# Patient Record
Sex: Female | Born: 1955
Health system: Southern US, Community
[De-identification: ages and names within clinical notes are randomized; demographics above are authoritative.]

## PROBLEM LIST (undated history)

## (undated) DIAGNOSIS — R7309 Other abnormal glucose: Secondary | ICD-10-CM

## (undated) DIAGNOSIS — I10 Essential (primary) hypertension: Secondary | ICD-10-CM

## (undated) DIAGNOSIS — E559 Vitamin D deficiency, unspecified: Secondary | ICD-10-CM

## (undated) DIAGNOSIS — F419 Anxiety disorder, unspecified: Secondary | ICD-10-CM

## (undated) DIAGNOSIS — T7840XA Allergy, unspecified, initial encounter: Secondary | ICD-10-CM

## (undated) DIAGNOSIS — F329 Major depressive disorder, single episode, unspecified: Secondary | ICD-10-CM

## (undated) DIAGNOSIS — G4730 Sleep apnea, unspecified: Secondary | ICD-10-CM

## (undated) DIAGNOSIS — M1990 Unspecified osteoarthritis, unspecified site: Secondary | ICD-10-CM

## (undated) DIAGNOSIS — M797 Fibromyalgia: Secondary | ICD-10-CM

## (undated) HISTORY — DX: Other abnormal glucose: R73.09

## (undated) HISTORY — DX: Anxiety disorder, unspecified: F41.9

## (undated) HISTORY — DX: Major depressive disorder, single episode, unspecified: F32.9

## (undated) HISTORY — PX: KNEE ARTHROPLASTY: SHX992

## (undated) HISTORY — DX: Essential (primary) hypertension: I10

## (undated) HISTORY — PX: JOINT REPLACEMENT: SHX530

## (undated) HISTORY — DX: Fibromyalgia: M79.7

## (undated) HISTORY — DX: Vitamin D deficiency, unspecified: E55.9

## (undated) HISTORY — DX: Allergy, unspecified, initial encounter: T78.40XA

---

## 1998-12-23 ENCOUNTER — Encounter: Payer: Self-pay | Admitting: Internal Medicine

## 1998-12-23 ENCOUNTER — Ambulatory Visit (HOSPITAL_COMMUNITY): Admission: RE | Admit: 1998-12-23 | Discharge: 1998-12-23 | Payer: Self-pay | Admitting: Internal Medicine

## 1999-06-11 ENCOUNTER — Other Ambulatory Visit: Admission: RE | Admit: 1999-06-11 | Discharge: 1999-06-11 | Payer: Self-pay | Admitting: Internal Medicine

## 1999-07-07 ENCOUNTER — Encounter: Payer: Self-pay | Admitting: Internal Medicine

## 1999-07-07 ENCOUNTER — Ambulatory Visit (HOSPITAL_COMMUNITY): Admission: RE | Admit: 1999-07-07 | Discharge: 1999-07-07 | Payer: Self-pay | Admitting: Internal Medicine

## 2000-01-18 ENCOUNTER — Emergency Department (HOSPITAL_COMMUNITY): Admission: EM | Admit: 2000-01-18 | Discharge: 2000-01-18 | Payer: Self-pay | Admitting: Emergency Medicine

## 2000-06-22 HISTORY — PX: ABDOMINAL HYSTERECTOMY: SHX81

## 2000-07-19 ENCOUNTER — Ambulatory Visit (HOSPITAL_COMMUNITY): Admission: RE | Admit: 2000-07-19 | Discharge: 2000-07-19 | Payer: Self-pay | Admitting: Internal Medicine

## 2000-07-19 ENCOUNTER — Encounter: Payer: Self-pay | Admitting: Internal Medicine

## 2001-02-23 ENCOUNTER — Other Ambulatory Visit: Admission: RE | Admit: 2001-02-23 | Discharge: 2001-02-23 | Payer: Self-pay | Admitting: Obstetrics & Gynecology

## 2001-05-10 ENCOUNTER — Observation Stay (HOSPITAL_COMMUNITY): Admission: RE | Admit: 2001-05-10 | Discharge: 2001-05-11 | Payer: Self-pay | Admitting: Obstetrics & Gynecology

## 2001-05-10 ENCOUNTER — Encounter (INDEPENDENT_AMBULATORY_CARE_PROVIDER_SITE_OTHER): Payer: Self-pay | Admitting: Specialist

## 2001-07-25 ENCOUNTER — Ambulatory Visit (HOSPITAL_COMMUNITY): Admission: RE | Admit: 2001-07-25 | Discharge: 2001-07-25 | Payer: Self-pay | Admitting: Internal Medicine

## 2001-07-25 ENCOUNTER — Encounter: Payer: Self-pay | Admitting: Internal Medicine

## 2001-07-28 ENCOUNTER — Ambulatory Visit (HOSPITAL_COMMUNITY): Admission: RE | Admit: 2001-07-28 | Discharge: 2001-07-28 | Payer: Self-pay | Admitting: Internal Medicine

## 2001-07-28 ENCOUNTER — Encounter: Payer: Self-pay | Admitting: Internal Medicine

## 2002-08-07 ENCOUNTER — Ambulatory Visit (HOSPITAL_COMMUNITY): Admission: RE | Admit: 2002-08-07 | Discharge: 2002-08-07 | Payer: Self-pay | Admitting: Internal Medicine

## 2002-08-07 ENCOUNTER — Encounter: Payer: Self-pay | Admitting: Internal Medicine

## 2002-10-08 ENCOUNTER — Encounter: Payer: Self-pay | Admitting: Pediatrics

## 2002-10-08 ENCOUNTER — Encounter: Payer: Self-pay | Admitting: Emergency Medicine

## 2002-10-08 ENCOUNTER — Inpatient Hospital Stay (HOSPITAL_COMMUNITY): Admission: EM | Admit: 2002-10-08 | Discharge: 2002-10-11 | Payer: Self-pay | Admitting: Emergency Medicine

## 2002-10-09 ENCOUNTER — Encounter: Payer: Self-pay | Admitting: Pediatrics

## 2002-10-11 ENCOUNTER — Inpatient Hospital Stay (HOSPITAL_COMMUNITY): Admission: EM | Admit: 2002-10-11 | Discharge: 2002-10-14 | Payer: Self-pay | Admitting: Psychiatry

## 2003-06-29 ENCOUNTER — Encounter: Admission: RE | Admit: 2003-06-29 | Discharge: 2003-06-29 | Payer: Self-pay | Admitting: Internal Medicine

## 2003-07-13 ENCOUNTER — Encounter: Admission: RE | Admit: 2003-07-13 | Discharge: 2003-07-13 | Payer: Self-pay | Admitting: Internal Medicine

## 2003-09-10 ENCOUNTER — Encounter: Admission: RE | Admit: 2003-09-10 | Discharge: 2003-09-10 | Payer: Self-pay | Admitting: Internal Medicine

## 2004-08-20 ENCOUNTER — Encounter: Admission: RE | Admit: 2004-08-20 | Discharge: 2004-08-20 | Payer: Self-pay | Admitting: Internal Medicine

## 2005-09-07 ENCOUNTER — Encounter: Admission: RE | Admit: 2005-09-07 | Discharge: 2005-09-07 | Payer: Self-pay | Admitting: Internal Medicine

## 2005-09-21 ENCOUNTER — Encounter: Admission: RE | Admit: 2005-09-21 | Discharge: 2005-09-21 | Payer: Self-pay | Admitting: Internal Medicine

## 2006-09-28 ENCOUNTER — Encounter: Admission: RE | Admit: 2006-09-28 | Discharge: 2006-09-28 | Payer: Self-pay | Admitting: Internal Medicine

## 2007-06-23 HISTORY — PX: COLONOSCOPY: SHX174

## 2007-10-26 ENCOUNTER — Encounter: Admission: RE | Admit: 2007-10-26 | Discharge: 2007-10-26 | Payer: Self-pay | Admitting: Internal Medicine

## 2007-11-28 ENCOUNTER — Ambulatory Visit: Payer: Self-pay | Admitting: Gastroenterology

## 2007-12-12 ENCOUNTER — Ambulatory Visit: Payer: Self-pay | Admitting: Gastroenterology

## 2008-12-26 ENCOUNTER — Encounter: Admission: RE | Admit: 2008-12-26 | Discharge: 2008-12-26 | Payer: Self-pay | Admitting: Internal Medicine

## 2009-12-27 ENCOUNTER — Encounter: Admission: RE | Admit: 2009-12-27 | Discharge: 2009-12-27 | Payer: Self-pay | Admitting: Internal Medicine

## 2010-11-07 NOTE — Op Note (Signed)
[facility]  Patient:    [name], [name] Visit Number: [id number] MRN: [id number]          Service Type: DSU Location: [id number] Attending Physician:  Minette Headland Dictated by:   Freddy Finner, M.D. Proc. Date: 05/10/01 Admit Date:  05/10/2001 Discharge Date: 05/11/2001                             Operative Report  PREOPERATIVE DIAGNOSES:       1. Fibroids.                               2. Menorrhagia.  POSTOPERATIVE DIAGNOSES:      1. Fibroids.                               2. Menorrhagia.                               3. Pelvic endometriosis.                               4. Filmy left adnexal adhesions.  PROCEDURE:                    1. Laparoscopically assisted vaginal                                  hysterectomy.                               2. Release of left adnexal adhesions.                               3. Fulguration of peritoneal endometriotic                                  lesions of left pelvic side wall and ovary.  SURGEON:                      Freddy Finner, M.D.  ASSISTANT:                    Guy Sandifer. Arleta Creek, M.D.  ESTIMATED BLOOD LOSS:         200 cc.  ANESTHESIA:                   General endotracheal.  INTRAOPERATIVE COMPLICATIONS: None.  HISTORY OF PRESENT ILLNESS:   Details of the present illness are recorded in the admission note.  DESCRIPTION OF PROCEDURE:     The patient was admitted on the morning of surgery. She was given a bolus of Cefotan IV. She was placed in PAS hose, she was brought to the operating room, placed under adequate general endotracheal anesthesia, placed in the dorsal lithotomy position using the [place] stirrup system. A Betadine prep of abdomen, perineum, and vagina was carried out in the usual fashion with scrub followed by solution. A Hulka tenaculum  was attached to the cervix under direct visualization. The bladder was evacuated with a Robinson catheter. Sterile  drapes were applied. An infraumbilical skin incision was made and through it an 11 mm trocar introduced while dilating the anterior abdominal wall manually. Direct inspection revealed adequate placement with no evidence of injury on entry. Pneumoperitoneum was allowed to accumulate with carbon dioxide gas. The bladder was still noted to be distended and nurse placed a Foley catheter which was left in during the procedure. A second incision was made just above the hairline and through it a 5 mm trocar was placed. A blunt probe was placed for use in manipulating structures during the procedure. Systematic examination of pelvic and abdominal contents was carried out. The only abnormalities are those noted in the postoperative diagnosis. Photos were taken and retained in the office record. Using the bipolar coagulation forceps, all visible evidence of endometriosis was fulgurated on the left pelvic side wall using a blunt probe. The ovary was released and fulguration was carried out at the adhesions on the ovary and on the lateral pelvic side wall. The utero-ovarian ligaments and upper broad ligament were then coagulated with bipolar forceps and progressively divided to free the ovary and round ligament on each side. Attention was then turned vaginally. Gas was allowed to escape from the abdomen. A posterior weighted vaginal retractor was placed. A colpotomy incision was made with Mayo scissors while attending the mucosa posterior to the cervix. The cervix was circumscribed with a scalpel. The ligasure system was then used to develop uterosacral ligaments, bladder pillars, and cardinal ligaments. The anterior peritoneum was entered. Vessel pedicles were controlled with the ligasure system on each side. A second pedicle was taken by the vessels on each side. The uterus was then delivered through the vaginal introitus. The remaining pedicle was controlled with the ligasure system and the uterus  removed. Angles of the vagina were then anchored to the uterosacrals with mattress sutures of #0 monocryl. The uterosacrals were plicated and posterior peritoneum closed with interrupted #0 monocryl suture. The cuff was closed vertically with figure-of-eights of monocryl. Attention was then redirected laparoscopically. Minimal bleeding sources on the cuff and on the peritoneal surfaces were controlled with the bipolar forceps without difficulty. Inspection ______ pressure revealed complete hemostasis. All instruments were removed. The skin incisions were closed with interrupted subcuticular sutures of 3-0 Dexon and with Dermabond as a bandage due to the patients allergy to band-aids. The patient tolerated the operative procedure well. She was awakened and taken to the recovery room in good condition. Dictated by:   Freddy Finner, M.D. Attending Physician:  Minette Headland DD:  05/11/01 TD:  05/11/01 Job: [id number]

## 2010-11-07 NOTE — H&P (Signed)
[facility]  Patient:    [name], [name] Visit Number: [id number] MRN: [id number]          Service Type: Attending:  Freddy Finner, M.D. Dictated by:   Freddy Finner, M.D. Adm. Date:  05/10/01                           History and Physical  ADMITTING DIAGNOSIS:          Uterine leiomyomata, menorrhagia.  HISTORY OF PRESENT ILLNESS:   Patient is a 55 year old white married female, gravida 2, para 2, who was seen most recently in the office on September 4 for an annual GYN examination.  She had been lost to follow-up in our office since 1993 following a tubal ligation.  For the last five years, she states that her menses have been regular at 28-day intervals, but the phlegm is very, very heavy and, at the present time, lasts for 14 days.  She has large clots measuring greater than 3 cm in diameter.  She wears super tampons and overnight pads and changes every hour on her heaviest days.  She does complain of moderately severe dysmenorrhea.  A pelvic ultrasound obtained in the office on February 23, 2001, did show two uterine leiomyomata, one measuring 4.3 x 3 cm, another probably submucous in location but somewhat smaller.  After careful consultation and use of oral contraceptives for two months with no success, the patient has requested definitive surgical intervention and is admitted at this time for that purpose.  Specifically, she is admitted for laparoscopically assisted vaginal hysterectomy.  Patient has requested to keep the ovaries unless they are abnormal.  REVIEW OF SYSTEMS:            Her current review of systems is otherwise negative.  PAST MEDICAL HISTORY/ MEDICATIONS:                  No known significant medical illnesses except for respiratory problems at the present time requiring Advair Diskus 250/50 and Combivent.  She also is on Zoloft 15 mg a day.  She takes Clarinex 5 mg a day.  Patient has never had a blood  transfusion.  DRUG ALLERGIES:               SULFA, E-MYCIN, CODEINE, and NAPROSYN.  PAST SURGICAL HISTORY:        Tubal ligation noted above.  She has had two vaginal births without consequence.  HABITS:                       She does not use cigarettes or alcohol.  FAMILY HISTORY:               Noncontributory.  PHYSICAL EXAMINATION:  HEENT:                        Grossly within normal limits.  NECK:                         Thyroid gland is not palpably enlarged.  CHEST:                        Clear to auscultation.  HEART:                        Normal sinus rhythm without murmurs,  rubs, or gallops.  BREASTS:                      Exam is considered to be normal.  No palpable nodules.  No nipple discharge.  No palpable masses.  No skin change.  ABDOMEN:                      Soft and nontender without appreciable organomegaly or palpable masses.  EXTREMITIES:                  Without cyanosis, clubbing, or edema.  PELVIC:                       External genitalia, vagina, and cervix were normal to inspection.  On bimanual exam, the uterus is anterior in position, upper normal in size, with a questionable of palpable nodule consistent with fibroid.  There are no palpable adnexal masses.  RECTAL:                       The rectum is normal and rectovaginal exam confirms.  LABORATORY DATA:              Pap smear in the office on September 4 was normal.  Mammogram is pending at the time of this dictation.  ASSESSMENT:                   Uterine leiomyomata, menorrhagia.  PLAN:                         Laparoscopically assisted vaginal hysterectomy. Patient has reviewed the video in the office describing the procedure including the potential risks of the procedure. Dictated by:   Freddy Finner, M.D. Attending:  Freddy Finner, M.D. DD:  05/09/01 TD:  05/09/01 Job: [id number]

## 2010-11-07 NOTE — Discharge Summary (Signed)
NAMENOBLE, BODIE                         ACCOUNT NO.:  [id number]   MEDICAL RECORD NO.:  [id number]                   PATIENT TYPE:  IPS   LOCATION:  0505                                 [facility]ILITY[facility]  [facility]   PHYSICIAN:  Jeanice Lim, M.D.              DATE OF BIRTH:  July 20, 1955   DATE OF ADMISSION:  10/11/2002  DATE OF DISCHARGE:  10/14/2002                                 DISCHARGE SUMMARY   ADMITTING DIAGNOSES:   AXIS I:  1. Somatoform disorder.  2. Panic disorder, not otherwise specified.   AXIS II:  None.   AXIS III:  1. Mild hypokalemia.  2. Fibromyalgia.  3. Hypertension by history.   AXIS IV:  Moderate stress.  Limited support system.   AXIS V:  20/60.   IDENTIFYING DATA:  This is a 55 year old married Caucasian female,  voluntarily admitted, with a history of anxiety and panic attacks, believed  she was having a stroke, with medication changes.  She complained of a rash,  history of fibromyalgia and multiple somatic complaints.  She does not  remember the episode just prior to admission of having a panic attack.   MEDICATIONS AFTER BEING TRANSFERRED FROM THE MEDICAL SERVICE:  1. Zoloft 50 mg b.i.d.  2. Klonopin 1 mg q.6h. p.r.n. anxiety.  3. Aspirin.  4. Advair.  5. Claritin.   ALLERGIES:  SULFA.  KEFLEX.  CELEBREX.  CODEINE.  ERYTHROMYCIN.   PHYSICAL EXAMINATION:  Essentially within normal limits.  Neurologically  nonfocal.   ROUTINE ADMISSION LABS:  Essentially within normal limits.  Mild hypokalemia  with a potassium of 3.2.   MENTAL STATUS EXAM:  [age]-year-old white female, mildly anxious, rapid  breathing.  Some stuttering.  Able to compose self.  Anxiety high.  Stuttering.  Speech rapid at times.  Mood anxious.  Distracted.  Significant  anxiety.  Inability to control her symptoms.  No evidence of suicidal or  homicidal ideation.  Complaining of tingling in her fingers and part of her  chest.  Cognitively intact.  Judgment and insight  fair.  The patient was  admitted, ordered routine p.r.n. medications, and underwent further  monitoring.  She was encouraged to participate in individual and group  milieu therapy.  The patient was resumed and continued on Zoloft,  hydrochlorothiazide, Claritin, aspirin, Ultram, K-Dur to replace potassium,  and was given Risperdal in addition to optimizing Zoloft to stabilize acute  agitation related to anxiety, and target longer term depressive symptoms.  The patient reported a positive response to clinical intervention and no  side effects from medication adjustments, and participated in aftercare  planning.  The patient's condition on discharge was improved.  Mood was more  euthymic.  Affect brighter.  She is much more calm.  Thought process goal  directed.  Thought content negative for dangerous ideation or psychotic  symptoms.  The patient reported motivation to be compliant with the  aftercare  plan.   DISCHARGE MEDICATIONS:  The patient was discharged on:  1. K-Dur 20 mEq two daily.  2. Hydrochlorothiazide 25 mg daily.  3. Zoloft 50 mg b.i.d.  4. Advair p.r.n.  5. Aspirin 325 daily.  6. Klonopin 0.5 b.i.d.  7. Risperdal 0.25 b.i.d.  8. Combivent inhaler q.4 p.r.n.  9. Ultram 100 mg q.6-8h p.r.n. pain.   The patient was to follow up at [facility], Wednesday, October 18, 2002, at 9 a.m.   DISCHARGE DIAGNOSES:   AXIS I:  1. Somatoform disorder.  2. Panic disorder, not otherwise specified.   AXIS II:  None.   AXIS III:  1. Mild hypokalemia.  2. Fibromyalgia.  3. Hypertension by history.   AXIS IV:  Moderate stress.  Limited support system.   AXIS VZachary George, M.D.    JEM/MEDQ  D:  11/15/2002  T:  11/16/2002  Job:  [id number]

## 2010-11-07 NOTE — H&P (Signed)
[name], [name]                         ACCOUNT NO.:  [id number]   MEDICAL RECORD NO.:  [id number]                   PATIENT TYPE:  IPS   LOCATION:  0505                                 [facility]ILITY[facility]  [facility]   PHYSICIAN:  Jeanice Lim, M.D.              DATE OF BIRTH:  31-Jan-1956   DATE OF ADMISSION:  10/11/2002  DATE OF DISCHARGE:  10/14/2002                         PSYCHIATRIC ADMISSION ASSESSMENT   IDENTIFYING INFORMATION:  This is a 55 year old married Caucasian female who  is a voluntary admission.   HISTORY OF PRESENT ILLNESS:  This patient, with a history of anxiety and  panic attacks, felt that she could keep the panic attacks under control  until about one week ago when she was diagnosed with fibromyalgia.  Shortly  after that, her panic, she feels, was made much worse by being started on  Effexor XR.  She stopped it but continued to have severe two weeks of panic  attacks.  She then went back to her Zoloft.  She has had a rash over her  trunk when she attributed to some new medications that were given to her for  fibromyalgia.  She has reported some numbness and heaviness in her limbs  and, on the day prior to admission, states that she panicked because she  could not remember even basic events that were going on around her.  She got  up.  Her husband had left for work and she found herself in the kitchen and  did not remember actually kissing him goodbye and saying goodbye to him.  She endorses some memory loss, changes in motor control, some severe  stuttering and has had a previous negative workup by Dr. Sharene Skeans on the  neurological service, who had initially admitted her to the neuro service to  rule out a possible TIA.  All findings were negative.  Today, the patient  denies any suicidal or homicidal ideation or hallucinations.  She endorses  severe anxiety with feelings of panic and transient numbness in her limbs.   PAST PSYCHIATRIC HISTORY:  The patient has  no prior psychiatric treatment  and has been seen only by her primary care physician, Dr. Oneta Rack.   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  She has been married for  the past 25 years and has two children.  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  No legal charges and she  denies any family stressors.   FAMILY HISTORY:  Unremarkable.   ALCOHOL/DRUG HISTORY:  The patient denies any history of alcohol or  substance abuse.   PAST MEDICAL HISTORY:  The patient is followed by Dr. Lucky Cowboy, who  is her primary care Jocelin Schuelke.  Medical problems include fibromyalgia,  possible elevated blood pressure which both she and her physician had  attributed to some medication that she had been placed on a couple of weeks  ago for fibromyalgia.  Past medical history is remarkable for bilateral  tubal ligation and history of hypertension.  Dr. Sharene Skeans has dictated the  discharge summary from the neurological service and he notes that, after  doing several tests, felt that this could be a psychiatric event, and the  patient was not having any actual stroke.  Dr. Jeanie Sewer had done a  psychiatric consult and felt that it was appropriate for her to be admitted.   MEDICATIONS:  At the time of transfer, Zoloft 50 mg p.o. b.i.d., Klonopin 1  mg q.6h. p.r.n. for anxiety, ASA 325 mg daily, Advair Diskus 250\50 1 puff  b.i.d. and Claritin 10 mg.   ALLERGIES:  SULFA, KEFLEX, CELEBREX, CODEINE and ERYTHROMYCIN.   POSITIVE PHYSICAL FINDINGS:  The patient's physical examination was done in  the neuro service where she had a negative workup for a TIA.  Today, we note  that she is quite anxious with rapid breathing, marked flushed face and  neck.  It is noted that her vital signs are within normal limits.   LABORATORY DATA:  Essentially within normal limits.  Metabolic panel, which  was done this morning, reveals very mild hypokalemia at 3.2.   MENTAL STATUS  EXAM:  This is a fully alert female who is very anxious with  short, rapid breathing and marked stuttering.  She is unable to stay  composed and has to stop and attempt to take some deep breaths because she  is just unable to speak to me.  Her anxiety was so high.  Speech is  stuttering and rapid.  She gasps for words between breaths.  Mood is very  anxious.  Thought process is distracted.  She loses track because of her  anxiety and inability to control her symptoms.  No overt evidence of  suicidal or homicidal ideation.  She does complain of currently experiencing  tingling in her fingers and her face and neck are both flushed down to the  upper part of her chest.  No overt paranoia.  Just active panic.  Cognitively, she is intact and oriented x 3.   DIAGNOSES:   AXIS I:  1. Rule out somatoform disorder.  2. Panic disorder not otherwise specified.   AXIS II:  No diagnosis.   AXIS III:  1. Mild hypokalemia.  2. Fibromyalgia.  3. Hypertension by history.   AXIS IV:  Moderate (stress from her own anxiety and anxiety symptoms and  previous medical problems).   AXIS V:  Current 16; past year 55.   PLAN:  Voluntarily admit the patient to treat her acute anxiety with panic  and her panic with physical symptoms.  We are going to give her a now dose  of Risperdal 0.25 mg p.o. in the morning and then we will do it again at  bedtime tonight and see if that assists in alleviating her symptoms.  Meanwhile, we are going to put her on a regular dose of Klonopin 0.5 mg p.o.  b.i.d. and additional Ativan 0.5 mg q.4h. p.r.n. if she has another acute  panic attack and we will give her K-Dur 20 mEq p.o. daily.  In addition to  that, we are going to continue her Zoloft 50 mg b.i.d. and we will plan on  titrating that upward during her stay and see if we can get her symptoms  under control and we will also ask the casemanager to speak with her husband for his concerns and gauge the level of  support.  ESTIMATED LENGTH OF STAY:  Five days.     Margaret A. Stephannie Peters                   Jeanice Lim, M.D.    MAS/MEDQ  D:  10/31/2002  T:  10/31/2002  Job:  [id number]

## 2010-11-07 NOTE — Discharge Summary (Signed)
NAMEPRUDY, Vanessa                         ACCOUNT NO.:  [id number]   MEDICAL RECORD NO.:  [id number]                   PATIENT TYPE:  INP   LOCATION:  3005                                 FACILITY:  [facility]   PHYSICIAN:  Marlan Palau, M.D.               DATE OF BIRTH:  12-12-1955   DATE OF ADMISSION:  10/08/2002  DATE OF DISCHARGE:  10/11/2002                                 DISCHARGE SUMMARY   ADMISSION DIAGNOSIS:  Possible transient ischemic attack event.   DISCHARGE DIAGNOSIS:  Psychogenic event, anxiety disorder. No evidence of  stroke.   PROCEDURE:  1. MRI of the brain.  2. MR angiogram.  3. CT of the head.  4. EEG study.   COMPLICATIONS:  Complications with the above procedures none.   HISTORY OF PRESENT ILLNESS:  The patient is a 55 year old patient born  [birth year] with a history of PAX fibromyalgia, hypertension, asthma. The  patient had awakened the morning of admission staring at the ceiling. The  patient apparently did not recall her husband kissing her before leaving the  room. The patient got up late out of bed. The patient was noted to have some  difficulty with expressing herself, clumsy in the right hand. Appeared  anxious, upset, confused, brought to the emergency room for an evaluation.  CT of the brain showed a questionable alteration of signal posterior limb of  the left internal capsule, possible artifact. The patient was admitted for  an evaluation of possible TIA event.   PAST MEDICAL HISTORY:  1. Stuttering speech, confusion, TIA versus psychogenic event.  2. Hypertension.  3. Asthma.  4. Fibromyalgia.  5. Panic attacks.  6. Allergic rhinitis.  7. Diarrhea.  8. History of bilateral tubal ligation.  9. Hysterectomy.   MEDICATIONS:  1. Hydrochlorothiazide 25 mg a day.  2. Clonazepam 0.25 mg twice a day.  3. Zoloft 50 mg one twice a day.  4. Zyrtec 10 mg a day.  5. Advair discus 50/250 mcg one puff twice a day.  6. Combivent inhaler one  puff as needed.  7. Tramadol 50 mg two every 4 hours as needed.  8. OsteoBioflex one in the morning.  9. Tylenol if needed.   ALLERGIES:  Celebrex, codeine, erythromycin, Keflex, and sulfa.   SOCIAL HISTORY:  The patient does not smoke or drink.   Please refer to the history and physical for the patient's social history,  family history, review of systems, and physical examination.   LABORATORY DATA:  Laboratory values notable for a homocystine level of 8.8,  cholesterol 228, triglycerides 153, HDL 45, VLDL 31, LDL 152, white count  6.6, hemoglobin 14.1, hematocrit 41.6, MC 89.9, platelets of 348, INR of  0.9. Sodium 136, potassium 3.0, chloride 103, CO2 25, glucose 102, BUN 12,  creatinine 0.8, calcium 9.2, total protein 6.8, albumin 3.8, AST 27, ALT 36,  ALP 68, total bili 0.9. Urinalysis was  specific gravity 1.005, pH 7.0. Drug  screen is unremarkable.   EKG reveals a normal sinus rhythm, normal EKG, heart rate 69.   HOSPITAL COURSE:  This patient was admitted to [facility] for an  evaluation. The patient was set up for an MRI scan of the brain, which was  unremarkable. MR angiogram was unremarkable. EEG study was done, it was  unremarkable. The patient is felt to have right-sided weakness and  stuttering speech that were associated with somatoform disorder, anxiety.  The patient was seen by psychiatry. The patient was felt to be a candidate  for behavior health admission. The patient was felt to have disassociation  and __________ conversion reaction. At this point in time this patient will  be discharged from [facility] and transferred to the behavioral  health center.   DISCHARGE MEDICATIONS:  Medication at this time will include.  1. Hydrochlorothiazide 25 mg a day.  2. Zoloft 50 mg b.i.d.  3. Claritin 10 mg a day.  4. Advair 250/50 discus twice a day.  5. Aspirin 325 mg a day.  6. Klonopin 1 mg if needed.  7. Tylenol if needed.   DISCHARGE  INSTRUCTIONS:  The patient will need to have a BMET rechecked. The  patient is on potassium chloride 40 mEq at this time. Blood work from today  revealed a sodium 143, potassium 3.4, chloride 106, CO2 29.  Potassium supplementation will need to be continued. BMET will need to be  followed. The patient will follow up with [facility] on  an as needed basis. No further neurological workup is indicated at this  time. The patient is bright, alert, cooperative, fully ambulatory at the  time of this dictation.                                                  Marlan Palau, M.D.    CKW/MEDQ  D:  10/11/2002  T:  10/11/2002  Job:  [id number]   cc:   Behavioral Health   [facility]  [address]   Jeoffrey Massed, M.D.  [facility]  [place], [place] [address]  Fax: [phone]

## 2010-11-07 NOTE — H&P (Signed)
[name], [place]                         ACCOUNT NO.:  [id number]   MEDICAL RECORD NO.:  [id number]                   PATIENT TYPE:  INP   LOCATION:  3005                                 FACILITY:  [facility]   PHYSICIAN:  Deanna Artis. Sharene Skeans, M.D.           DATE OF BIRTH:  05-23-1956   DATE OF ADMISSION:  10/08/2002  DATE OF DISCHARGE:                                HISTORY & PHYSICAL   CHIEF COMPLAINT:  Stuttering speech and confusion, onset this morning.   HISTORY OF THE PRESENT ILLNESS:  The patient is a 55 year old married woman  with anxiety/depression, panic attacks, fibromyalgia, hypertension, asthma,  allergic rhinitis, diarrhea and rash.  This morning, she awakened around  7:30, staring at the ceiling.  She does not recall her husband kissing her  and leaving the room.  She got up late and was rushing around to get ready  for church.  Her children noted that she had difficulty expressing herself  and she seemed to be clumsy in her right hand.  She became anxious, upset  and confused and was brought to the emergency room for evaluation.  Her  behavior improved while she was here.  CT scan of the brain showed a  possible alteration in signal in the posterior limb of the left internal  capsule.  The remainder of the examination was normal.  I was asked by Dr.  Othelia Pulling, on call for Dr. Lucky Cowboy, to assess and admit this  patient for possible TIA and stroke.   PAST MEDICAL HISTORY:  While shopping with the patient's mother yesterday,  she got lost.  She was not able to understand where she was headed when  they finished shopping (home).  She had a reaction to Effexor earlier this  month and has had significant blurred vision and altered mentation.  She has  complained of difficulty swallowing, lightheadedness and occasional  carpopedal spasm.  The patient, at times, is so stiff and sore that she has  difficulty getting out of bed but she has not missed any work.   She  complains of migraines of long duration that are usually treated with  nonsteroidal anti-inflammatory agents.  On occasion, she uses Maxalt.  She  has had a longstanding history of pain in her hands and legs below the knee,  and whole body on occasion.  She was recently seen by Dr. Lemmie Evens,  who ruled out an underlying rheumatologic disorder and suggested that this  might be fibromyalgia.   PAST SURGICAL HISTORY:  Bilateral tubal ligation, followed later by  hysterectomy.   MEDICATIONS:  1. Hydrochlorothiazide 25 mg per day.  2. Clonazepam 0.25 mg twice daily.  3. Zoloft 50 mg twice daily.  4. Zyrtec 10 mg every morning.  5. Advair Diskus 50/250 mcg one puff twice a day.  6. Combivent one puff as needed.  7. Tramadol 50 mg two p.o. q.4h. p.r.n.  8. Osteo-Bi-Flex triple strength -- one in the morning (she will bring this     from home).  9. Extra-Strength Tylenol 500 mg one to two every four hours as needed for     pain.   ALLERGIES:  Drug allergies include CELEBREX, CODEINE, ERYTHROMYCIN, KEFLEX  and SULFA.   FAMILY HISTORY:  The patient has a history of stroke in her family.   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  She  does not use tobacco, alcohol or drugs.   PHYSICAL EXAMINATION:  GENERAL:  On examination today, the patient is awake,  anxious, tearful and she has angioedema of her neck.  VITAL SIGNS:  Blood pressure is 115/61, resting pulse 96, respirations 24,  temperature 97.  ENT:  No infection.  NECK:  No bruits.  Supple neck.  LUNGS:  Clear.  HEART:  No murmurs.  Pulses normal.  ABDOMEN:  Abdomen soft.  Bowel sounds normal.  No hepatosplenomegaly.  EXTREMITIES:  No edema, cyanosis, inflammation or swelling of her joints.  No altered tone.  SKIN:  See above.  No other rash.  NEUROLOGIC:  Mental status:  The patient is awake, alert, anxious.   She  names objects and follows commands.  She has a quivering lisp.  There was no  stuttering.  She was able to name objects, repeat phrases and follow  commands.  Cranial nerve examination:  Round reactive pupils.  Normal fundi.  Full visual fields to double simultaneous stimuli.  Extraocular movements  full and conjugate, okay and responses equal bilaterally.  Symmetric facial  strength.  Midline tongue and uvula.  Air conduction greater than bone  conduction bilaterally. Motor examination:  Normal strength without ________  and normal fine motor movements.  No pronator drift .  Sensation intact to  cold, vibration and stereoagnosis.  Cerebellar examination:  Good finger-to-  nose and rapid repetitive movements.  No tremors, dystaxia or dysmetria.  Gait was normal; she was able to walk on her heels and toes and perform a  tandem without difficulty.  Reflexes were normal.  She had bilateral flexor  plantar responses.   IMPRESSION AND PLAN:  Transient ischemic attack, 435.8.  I suspect that this  has been a __________  and not an ischemic event.  Nonetheless, with  positive CT examination.  We will evaluate with MRI, MRA and laboratories to  look for risks of stroke.  If MRI and MRA are negative, we will not carry  this on further.  The patient will need evaluation by Dr. Antonietta Breach of  psychiatric service.  I will call this in today for tomorrow, October 09, 2002.  If the workup for stroke is negative, she will be transferred to Dr.  Kathryne Sharper service.                                               Deanna Artis. Sharene Skeans, M.D.    [facility]  D:  10/08/2002  T:  10/09/2002  Job:  [id number]   cc:   Jeoffrey Massed, M.D.  [facility]  [place], [place] [address]  Fax: [phone]

## 2011-01-26 ENCOUNTER — Other Ambulatory Visit: Payer: Self-pay | Admitting: Internal Medicine

## 2011-01-26 DIAGNOSIS — Z1231 Encounter for screening mammogram for malignant neoplasm of breast: Secondary | ICD-10-CM

## 2011-02-04 ENCOUNTER — Ambulatory Visit
Admission: RE | Admit: 2011-02-04 | Discharge: 2011-02-04 | Disposition: A | Discharge status: Home / Self Care | Payer: Medicare Other | Source: Ambulatory Visit | Attending: Internal Medicine | Admitting: Internal Medicine

## 2011-02-04 DIAGNOSIS — Z1231 Encounter for screening mammogram for malignant neoplasm of breast: Secondary | ICD-10-CM

## 2011-07-05 IMAGING — MG MM SCREEN MAMMOGRAM BILATERAL
4 series · 4 of 4 positions shown · non-contrast
Comparison: none

DG SCREEN MAMMOGRAM BILATERAL
Bilateral CC and MLO view(s) were taken.

DIGITAL SCREENING MAMMOGRAM WITH CAD:
The breast tissue is heterogeneously dense.  No masses or malignant type calcifications are 
identified.  Compared with prior studies.
Images were processed with CAD.

[R CC]
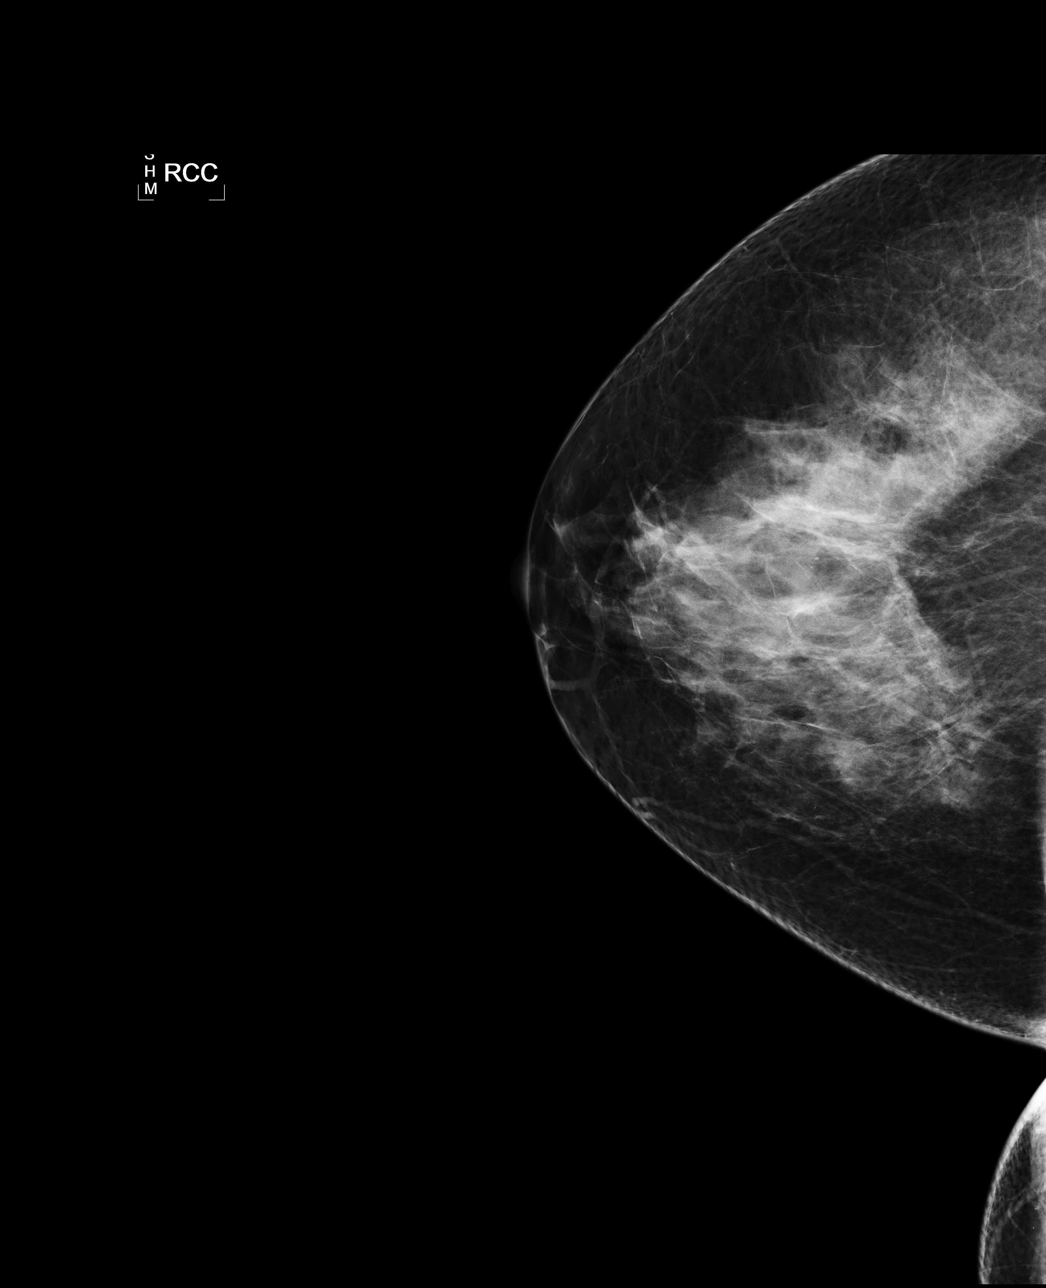

[L CC]
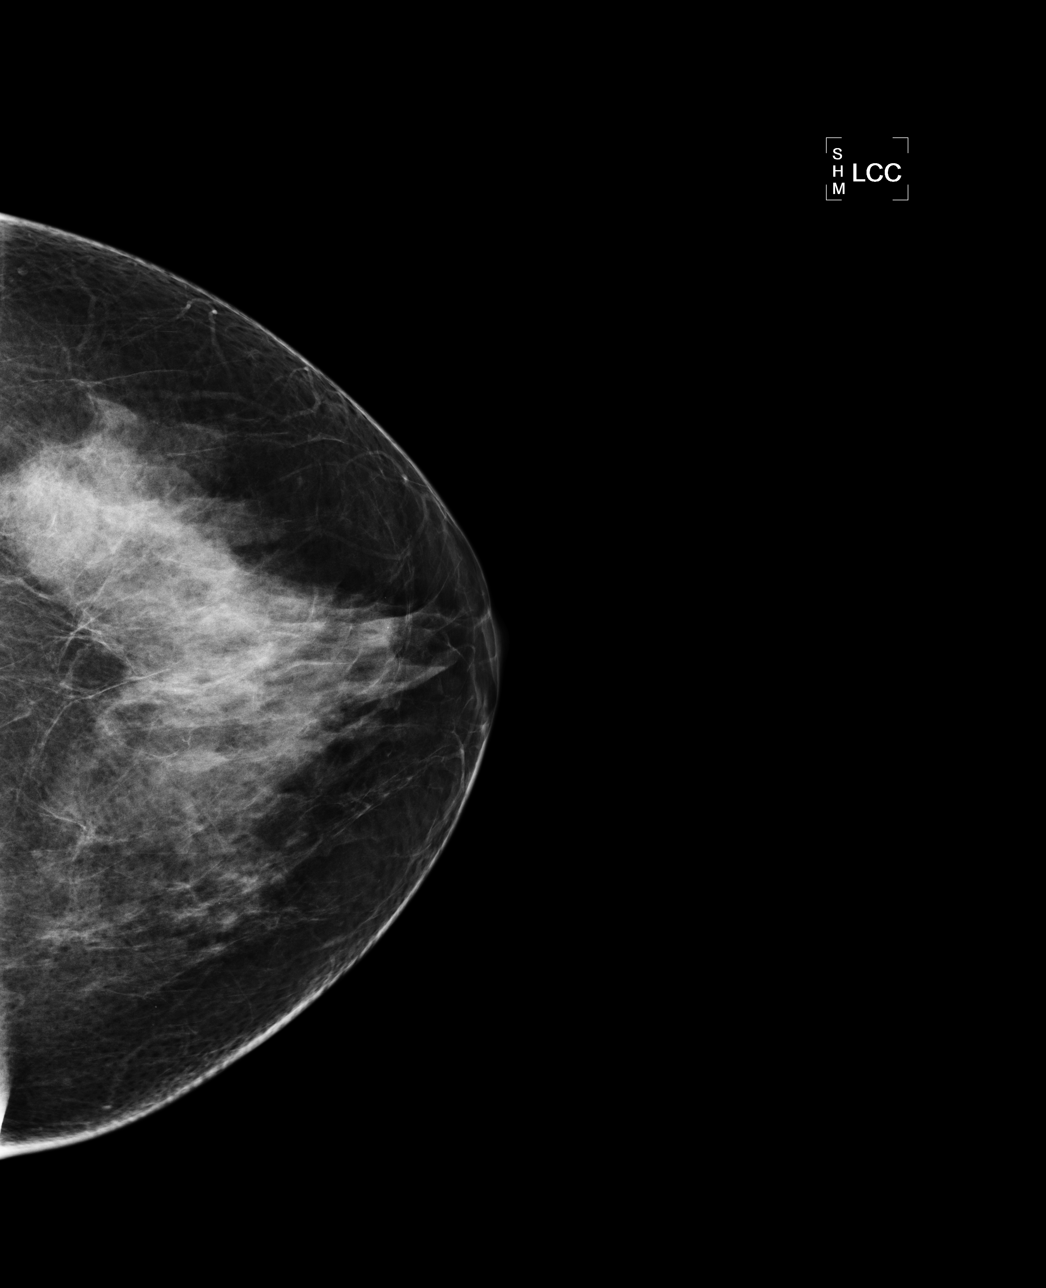

[L MLO]
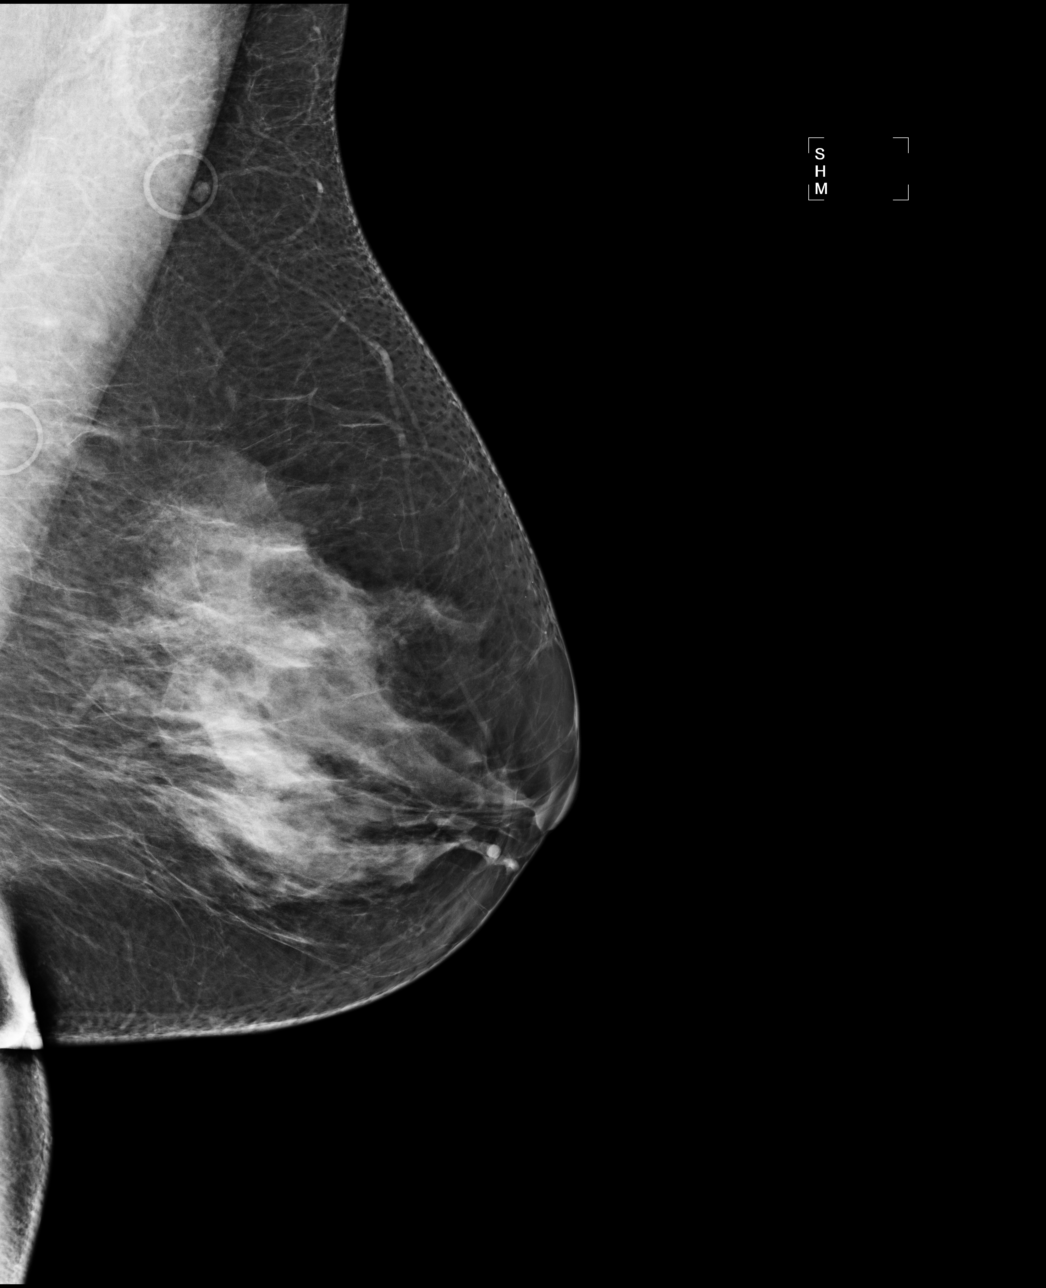

[R MLO]
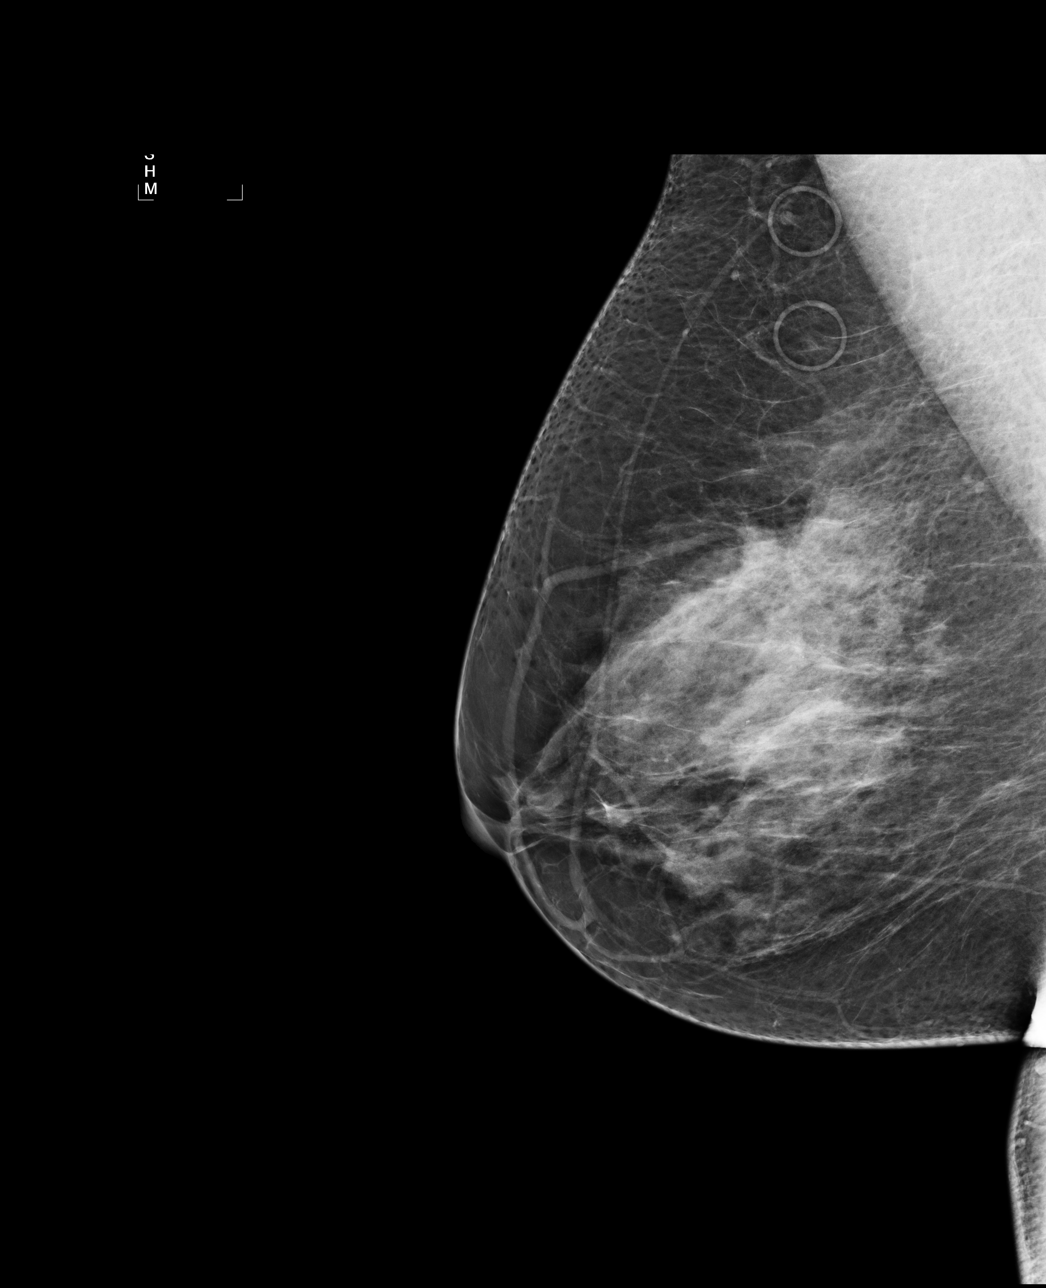

[4 of 4 positions shown; findings below may reference images not displayed]

IMPRESSION: No specific mammographic evidence of malignancy.  Next screening mammogram is recommended in one 
year.

A result letter of this screening mammogram will be mailed directly to the patient.

ASSESSMENT: Negative - BI-RADS 1

Screening mammogram in 1 year.
ANALYZED BY COMPUTER AIDED DETECTION. , THIS PROCEDURE WAS A DIGITAL MAMMOGRAM.

## 2012-01-27 ENCOUNTER — Other Ambulatory Visit: Payer: Self-pay | Admitting: Internal Medicine

## 2012-01-27 DIAGNOSIS — M949 Disorder of cartilage, unspecified: Secondary | ICD-10-CM

## 2012-01-27 DIAGNOSIS — N6019 Diffuse cystic mastopathy of unspecified breast: Secondary | ICD-10-CM

## 2012-01-27 DIAGNOSIS — Z1231 Encounter for screening mammogram for malignant neoplasm of breast: Secondary | ICD-10-CM

## 2012-02-17 ENCOUNTER — Ambulatory Visit
Admission: RE | Admit: 2012-02-17 | Discharge: 2012-02-17 | Disposition: A | Discharge status: Home / Self Care | Payer: Medicare Other | Source: Ambulatory Visit | Attending: Internal Medicine | Admitting: Internal Medicine

## 2012-02-17 DIAGNOSIS — M899 Disorder of bone, unspecified: Secondary | ICD-10-CM

## 2012-02-17 DIAGNOSIS — Z1231 Encounter for screening mammogram for malignant neoplasm of breast: Secondary | ICD-10-CM

## 2012-02-17 DIAGNOSIS — N6019 Diffuse cystic mastopathy of unspecified breast: Secondary | ICD-10-CM

## 2013-03-16 ENCOUNTER — Other Ambulatory Visit: Payer: Self-pay | Admitting: Orthopedic Surgery

## 2013-03-22 ENCOUNTER — Other Ambulatory Visit: Payer: Self-pay

## 2013-03-22 DIAGNOSIS — Z1231 Encounter for screening mammogram for malignant neoplasm of breast: Secondary | ICD-10-CM

## 2013-03-29 ENCOUNTER — Ambulatory Visit
Admission: RE | Admit: 2013-03-29 | Discharge: 2013-03-29 | Disposition: A | Discharge status: Home / Self Care | Payer: Medicare Other | Source: Ambulatory Visit

## 2013-03-29 DIAGNOSIS — Z1231 Encounter for screening mammogram for malignant neoplasm of breast: Secondary | ICD-10-CM

## 2013-04-03 ENCOUNTER — Encounter (HOSPITAL_COMMUNITY): Payer: Self-pay | Admitting: Pharmacy Technician

## 2013-04-05 ENCOUNTER — Ambulatory Visit (HOSPITAL_COMMUNITY)
Admission: RE | Admit: 2013-04-05 | Discharge: 2013-04-05 | Disposition: A | Discharge status: Home / Self Care | Payer: Medicare Other | Source: Ambulatory Visit | Attending: Orthopedic Surgery | Admitting: Orthopedic Surgery

## 2013-04-05 ENCOUNTER — Encounter (HOSPITAL_COMMUNITY): Payer: Self-pay

## 2013-04-05 ENCOUNTER — Encounter (HOSPITAL_COMMUNITY)
Admission: RE | Admit: 2013-04-05 | Discharge: 2013-04-05 | Disposition: A | Discharge status: Home / Self Care | Payer: Medicare Other | Source: Ambulatory Visit | Attending: Orthopedic Surgery | Admitting: Orthopedic Surgery

## 2013-04-05 DIAGNOSIS — Z01812 Encounter for preprocedural laboratory examination: Secondary | ICD-10-CM | POA: Insufficient documentation

## 2013-04-05 DIAGNOSIS — Z01818 Encounter for other preprocedural examination: Secondary | ICD-10-CM | POA: Insufficient documentation

## 2013-04-05 HISTORY — DX: Unspecified osteoarthritis, unspecified site: M19.90

## 2013-04-05 HISTORY — DX: Sleep apnea, unspecified: G47.30

## 2013-04-05 HISTORY — DX: Essential (primary) hypertension: I10

## 2013-04-05 LAB — CBC WITH DIFFERENTIAL/PLATELET
Basophils Absolute: 0 10*3/uL (ref 0.0–0.1)
Eosinophils Absolute: 0.2 10*3/uL (ref 0.0–0.7)
Eosinophils Relative: 3 % (ref 0–5)
HCT: 40.8 % (ref 36.0–46.0)
Hemoglobin: 14.1 g/dL (ref 12.0–15.0)
Lymphocytes Relative: 37 % (ref 12–46)
MCH: 31.3 pg (ref 26.0–34.0)
MCV: 90.7 fL (ref 78.0–100.0)
Monocytes Absolute: 0.7 10*3/uL (ref 0.1–1.0)
Platelets: 316 10*3/uL (ref 150–400)
RBC: 4.5 MIL/uL (ref 3.87–5.11)
RDW: 13.4 % (ref 11.5–15.5)

## 2013-04-05 LAB — URINALYSIS, ROUTINE W REFLEX MICROSCOPIC
Glucose, UA: NEGATIVE mg/dL
Hgb urine dipstick: NEGATIVE
Ketones, ur: NEGATIVE mg/dL
Protein, ur: NEGATIVE mg/dL
Urobilinogen, UA: 0.2 mg/dL (ref 0.0–1.0)

## 2013-04-05 LAB — COMPREHENSIVE METABOLIC PANEL
ALT: 31 U/L (ref 0–35)
AST: 23 U/L (ref 0–37)
BUN: 16 mg/dL (ref 6–23)
CO2: 28 mEq/L (ref 19–32)
Calcium: 9.8 mg/dL (ref 8.4–10.5)
Creatinine, Ser: 0.67 mg/dL (ref 0.50–1.10)
GFR calc Af Amer: >90 mL/min (ref >90)
GFR calc non Af Amer: >90 mL/min (ref >90)
Glucose, Bld: 71 mg/dL (ref 70–99)
Sodium: 141 mEq/L (ref 135–145)
Total Protein: 7.8 g/dL (ref 6.0–8.3)

## 2013-04-05 LAB — TYPE AND SCREEN
ABO/RH(D): O POS
Antibody Screen: NEGATIVE

## 2013-04-05 LAB — SURGICAL PCR SCREEN: MRSA, PCR: NEGATIVE

## 2013-04-05 LAB — ABO/RH: ABO/RH(D): O POS

## 2013-04-05 LAB — PROTIME-INR: INR: 1.05 (ref 0.00–1.49)

## 2013-04-05 LAB — APTT: aPTT: 34 seconds (ref 24–37)

## 2013-04-05 NOTE — Progress Notes (Signed)
Dr Oneta Rack office called for current ekg

## 2013-04-05 NOTE — Pre-Procedure Instructions (Signed)
[name]  04/05/2013   Your procedure is scheduled on:  Friday, October 24th  Report to Main Entrance "A" and check in with admitting at 0530 AM.  Call this number if you have problems the morning of surgery: [phone]   Remember:   Do not eat food or drink liquids after midnight.   Take these medicines the morning of surgery with A SIP OF WATER: zyrtec, seroquel, tramadol if needed, tylenol if needed  Stop taking aspirin, over the counter vitamins/herbal medications, NSAIDS 5 days prior to surgery   Do not wear jewelry, make-up or nail polish.  Do not wear lotions, powders, or perfumes. You may wear deodorant.  Do not shave 48 hours prior to surgery. Men may shave face and neck.  Do not bring valuables to the hospital.  [facility] is not responsible   for any belongings or valuables.               Contacts, dentures or bridgework may not be worn into surgery.  Leave suitcase in the car. After surgery it may be brought to your room.  For patients admitted to the hospital, discharge time is determined by your treatment team.   Special Instructions: Shower using CHG 2 nights before surgery and the night before surgery.  If you shower the day of surgery use CHG.  Use special wash - you have one bottle of CHG for all showers.  You should use approximately 1/3 of the bottle for each shower.   Please read over the following fact sheets that you were given: Pain Booklet, Coughing and Deep Breathing, Blood Transfusion Information, MRSA Information and Surgical Site Infection Prevention

## 2013-04-13 MED ORDER — CLINDAMYCIN PHOSPHATE 900 MG/50ML IV SOLN
900.0000 mg | INTRAVENOUS | Status: AC
  Administered 2013-04-14: 900 mg via INTRAVENOUS
  Filled 2013-04-13: qty 50

## 2013-04-14 ENCOUNTER — Encounter (HOSPITAL_COMMUNITY): Payer: Medicare Other | Admitting: Anesthesiology

## 2013-04-14 ENCOUNTER — Encounter (HOSPITAL_COMMUNITY)
Admission: RE | Disposition: A | Discharge status: Home Health | Payer: Self-pay | Source: Ambulatory Visit | Attending: Orthopedic Surgery

## 2013-04-14 ENCOUNTER — Inpatient Hospital Stay (HOSPITAL_COMMUNITY)
Admission: RE | Admit: 2013-04-14 | Discharge: 2013-04-16 | DRG: 470 | Disposition: A | Discharge status: Home Health | Payer: Medicare Other | Source: Ambulatory Visit | Attending: Orthopedic Surgery | Admitting: Orthopedic Surgery

## 2013-04-14 ENCOUNTER — Inpatient Hospital Stay (HOSPITAL_COMMUNITY): Payer: Medicare Other | Admitting: Anesthesiology

## 2013-04-14 ENCOUNTER — Encounter (HOSPITAL_COMMUNITY): Payer: Self-pay | Admitting: *Deleted

## 2013-04-14 DIAGNOSIS — Z91040 Latex allergy status: Secondary | ICD-10-CM

## 2013-04-14 DIAGNOSIS — M1710 Unilateral primary osteoarthritis, unspecified knee: Principal | ICD-10-CM | POA: Diagnosis present

## 2013-04-14 DIAGNOSIS — G4730 Sleep apnea, unspecified: Secondary | ICD-10-CM | POA: Diagnosis present

## 2013-04-14 DIAGNOSIS — Z888 Allergy status to other drugs, medicaments and biological substances status: Secondary | ICD-10-CM

## 2013-04-14 DIAGNOSIS — Z882 Allergy status to sulfonamides status: Secondary | ICD-10-CM

## 2013-04-14 DIAGNOSIS — Z881 Allergy status to other antibiotic agents status: Secondary | ICD-10-CM

## 2013-04-14 DIAGNOSIS — I10 Essential (primary) hypertension: Secondary | ICD-10-CM | POA: Diagnosis present

## 2013-04-14 HISTORY — PX: TOTAL KNEE ARTHROPLASTY: SHX125

## 2013-04-14 SURGERY — ARTHROPLASTY, KNEE, TOTAL
Anesthesia: Regional | Site: Knee | Laterality: Right | Wound class: Clean

## 2013-04-14 MED ORDER — POLYETHYLENE GLYCOL 3350 17 G PO PACK: 17.0000 g | PACK | Freq: Every day | ORAL | Status: DC | PRN

## 2013-04-14 MED ORDER — SODIUM CHLORIDE 0.9 % IR SOLN
Status: DC | PRN
  Administered 2013-04-14: 1000 mL
  Administered 2013-04-14: 3000 mL

## 2013-04-14 MED ORDER — MEPERIDINE HCL 25 MG/ML IJ SOLN
6.2500 mg | INTRAMUSCULAR | Status: DC | PRN
  Administered 2013-04-14: 12.5 mg via INTRAVENOUS

## 2013-04-14 MED ORDER — OXYCODONE HCL 5 MG/5ML PO SOLN: 5.0000 mg | Freq: Once | ORAL | Status: DC | PRN

## 2013-04-14 MED ORDER — DEXAMETHASONE 4 MG PO TABS
10.0000 mg | ORAL_TABLET | Freq: Three times a day (TID) | ORAL | Status: AC
  Administered 2013-04-14 – 2013-04-15 (×3): 10 mg via ORAL
  Filled 2013-04-14 (×3): qty 1

## 2013-04-14 MED ORDER — METHYLPREDNISOLONE ACETATE 80 MG/ML IJ SUSP
INTRAMUSCULAR | Status: DC | PRN
  Administered 2013-04-14: 160 mg via INTRA_ARTICULAR

## 2013-04-14 MED ORDER — PROMETHAZINE HCL 25 MG/ML IJ SOLN: 6.2500 mg | INTRAMUSCULAR | Status: DC | PRN

## 2013-04-14 MED ORDER — HYDROCHLOROTHIAZIDE 25 MG PO TABS
25.0000 mg | ORAL_TABLET | Freq: Every day | ORAL | Status: DC
  Administered 2013-04-14 – 2013-04-16 (×3): 25 mg via ORAL
  Filled 2013-04-14 (×3): qty 1

## 2013-04-14 MED ORDER — LABETALOL HCL 5 MG/ML IV SOLN
INTRAVENOUS | Status: DC | PRN
  Administered 2013-04-14: 5 mg via INTRAVENOUS

## 2013-04-14 MED ORDER — BISACODYL 10 MG RE SUPP: 10.0000 mg | Freq: Every day | RECTAL | Status: DC | PRN

## 2013-04-14 MED ORDER — SODIUM CHLORIDE 0.9 % IV SOLN: INTRAVENOUS | Status: DC

## 2013-04-14 MED ORDER — METHOCARBAMOL 100 MG/ML IJ SOLN
500.0000 mg | Freq: Four times a day (QID) | INTRAVENOUS | Status: DC | PRN
  Filled 2013-04-14: qty 5

## 2013-04-14 MED ORDER — MEPERIDINE HCL 25 MG/ML IJ SOLN
INTRAMUSCULAR | Status: AC
  Filled 2013-04-14: qty 1

## 2013-04-14 MED ORDER — METHOCARBAMOL 750 MG PO TABS: 750.0000 mg | ORAL_TABLET | Freq: Three times a day (TID) | ORAL | Status: DC

## 2013-04-14 MED ORDER — PHENYLEPHRINE HCL 10 MG/ML IJ SOLN
INTRAMUSCULAR | Status: DC | PRN
  Administered 2013-04-14 (×8): 40 ug via INTRAVENOUS
  Administered 2013-04-14: 80 ug via INTRAVENOUS

## 2013-04-14 MED ORDER — METHOCARBAMOL 500 MG PO TABS
500.0000 mg | ORAL_TABLET | Freq: Four times a day (QID) | ORAL | Status: DC | PRN
  Administered 2013-04-14 – 2013-04-15 (×4): 500 mg via ORAL
  Filled 2013-04-14 (×4): qty 1

## 2013-04-14 MED ORDER — CLINDAMYCIN PHOSPHATE 600 MG/50ML IV SOLN
600.0000 mg | Freq: Four times a day (QID) | INTRAVENOUS | Status: AC
  Administered 2013-04-14 (×2): 600 mg via INTRAVENOUS
  Filled 2013-04-14 (×2): qty 50

## 2013-04-14 MED ORDER — ASPIRIN EC 325 MG PO TBEC: 325.0000 mg | DELAYED_RELEASE_TABLET | Freq: Two times a day (BID) | ORAL | Status: DC

## 2013-04-14 MED ORDER — DEXAMETHASONE SODIUM PHOSPHATE 10 MG/ML IJ SOLN
10.0000 mg | Freq: Three times a day (TID) | INTRAMUSCULAR | Status: AC
  Filled 2013-04-14 (×3): qty 1

## 2013-04-14 MED ORDER — METHYLPREDNISOLONE ACETATE 80 MG/ML IJ SUSP
INTRAMUSCULAR | Status: AC
  Filled 2013-04-14: qty 1

## 2013-04-14 MED ORDER — SODIUM CHLORIDE 0.9 % IV SOLN
INTRAVENOUS | Status: DC | PRN
  Administered 2013-04-14: 08:00:00 via INTRAVENOUS

## 2013-04-14 MED ORDER — ACETAMINOPHEN 325 MG PO TABS
650.0000 mg | ORAL_TABLET | Freq: Four times a day (QID) | ORAL | Status: DC | PRN
  Administered 2013-04-14 – 2013-04-15 (×3): 650 mg via ORAL
  Filled 2013-04-14 (×3): qty 2

## 2013-04-14 MED ORDER — BUPIVACAINE HCL (PF) 0.25 % IJ SOLN
INTRAMUSCULAR | Status: DC | PRN
  Administered 2013-04-14: 8 mL via INTRA_ARTICULAR

## 2013-04-14 MED ORDER — MIDAZOLAM HCL 2 MG/2ML IJ SOLN: 0.5000 mg | Freq: Once | INTRAMUSCULAR | Status: DC | PRN

## 2013-04-14 MED ORDER — TRAMADOL HCL 50 MG PO TABS
50.0000 mg | ORAL_TABLET | Freq: Four times a day (QID) | ORAL | Status: DC | PRN
  Administered 2013-04-15 – 2013-04-16 (×4): 50 mg via ORAL
  Filled 2013-04-14 (×4): qty 1

## 2013-04-14 MED ORDER — ACETAMINOPHEN 650 MG RE SUPP: 650.0000 mg | Freq: Four times a day (QID) | RECTAL | Status: DC | PRN

## 2013-04-14 MED ORDER — LORATADINE 10 MG PO TABS
10.0000 mg | ORAL_TABLET | Freq: Every day | ORAL | Status: DC
  Administered 2013-04-15 – 2013-04-16 (×2): 10 mg via ORAL
  Filled 2013-04-14 (×2): qty 1

## 2013-04-14 MED ORDER — BUPIVACAINE-EPINEPHRINE PF 0.5-1:200000 % IJ SOLN
INTRAMUSCULAR | Status: DC | PRN
  Administered 2013-04-14: 30 mL

## 2013-04-14 MED ORDER — HYDROMORPHONE HCL PF 1 MG/ML IJ SOLN
1.0000 mg | INTRAMUSCULAR | Status: DC | PRN
  Administered 2013-04-14: 1 mg via INTRAVENOUS
  Filled 2013-04-14: qty 1

## 2013-04-14 MED ORDER — FENTANYL CITRATE 0.05 MG/ML IJ SOLN
INTRAMUSCULAR | Status: DC | PRN
  Administered 2013-04-14: 50 ug via INTRAVENOUS
  Administered 2013-04-14: 100 ug via INTRAVENOUS
  Administered 2013-04-14 (×5): 50 ug via INTRAVENOUS

## 2013-04-14 MED ORDER — OXYCODONE-ACETAMINOPHEN 5-325 MG PO TABS: 1.0000 | ORAL_TABLET | Freq: Four times a day (QID) | ORAL | Status: DC | PRN

## 2013-04-14 MED ORDER — DOCUSATE SODIUM 100 MG PO CAPS
100.0000 mg | ORAL_CAPSULE | Freq: Two times a day (BID) | ORAL | Status: DC
  Administered 2013-04-14 – 2013-04-16 (×3): 100 mg via ORAL
  Filled 2013-04-14 (×4): qty 1

## 2013-04-14 MED ORDER — TRANEXAMIC ACID 100 MG/ML IV SOLN
1000.0000 mg | INTRAVENOUS | Status: AC
  Administered 2013-04-14: 1000 mg via INTRAVENOUS
  Filled 2013-04-14: qty 10

## 2013-04-14 MED ORDER — SODIUM CHLORIDE 0.9 % IV SOLN: INTRAVENOUS | Status: DC | PRN

## 2013-04-14 MED ORDER — PROPOFOL 10 MG/ML IV BOLUS
INTRAVENOUS | Status: DC | PRN
  Administered 2013-04-14: 150 mg via INTRAVENOUS
  Administered 2013-04-14: 20 mg via INTRAVENOUS

## 2013-04-14 MED ORDER — ONDANSETRON HCL 4 MG/2ML IJ SOLN
INTRAMUSCULAR | Status: DC | PRN
  Administered 2013-04-14: 4 mg via INTRAVENOUS

## 2013-04-14 MED ORDER — OXYCODONE-ACETAMINOPHEN 5-325 MG PO TABS: 1.0000 | ORAL_TABLET | ORAL | Status: DC | PRN

## 2013-04-14 MED ORDER — DEXAMETHASONE SODIUM PHOSPHATE 10 MG/ML IJ SOLN
10.0000 mg | Freq: Once | INTRAMUSCULAR | Status: AC
  Administered 2013-04-14: 10 mg via INTRAVENOUS
  Filled 2013-04-14: qty 1

## 2013-04-14 MED ORDER — ALUM & MAG HYDROXIDE-SIMETH 200-200-20 MG/5ML PO SUSP
30.0000 mL | ORAL | Status: DC | PRN
  Administered 2013-04-15: 30 mL via ORAL
  Filled 2013-04-14: qty 30

## 2013-04-14 MED ORDER — MIDAZOLAM HCL 5 MG/5ML IJ SOLN
INTRAMUSCULAR | Status: DC | PRN
  Administered 2013-04-14 (×2): 1 mg via INTRAVENOUS

## 2013-04-14 MED ORDER — HYDROMORPHONE HCL PF 1 MG/ML IJ SOLN: 0.2500 mg | INTRAMUSCULAR | Status: DC | PRN

## 2013-04-14 MED ORDER — ASPIRIN EC 325 MG PO TBEC
325.0000 mg | DELAYED_RELEASE_TABLET | Freq: Two times a day (BID) | ORAL | Status: DC
  Administered 2013-04-14 – 2013-04-16 (×4): 325 mg via ORAL
  Filled 2013-04-14 (×6): qty 1

## 2013-04-14 MED ORDER — DEXTROSE 5 % IV SOLN
INTRAVENOUS | Status: DC | PRN
  Administered 2013-04-14: 08:00:00 via INTRAVENOUS

## 2013-04-14 MED ORDER — ONDANSETRON HCL 4 MG/2ML IJ SOLN
4.0000 mg | Freq: Four times a day (QID) | INTRAMUSCULAR | Status: DC | PRN
  Administered 2013-04-14 (×2): 4 mg via INTRAVENOUS
  Filled 2013-04-14 (×2): qty 2

## 2013-04-14 MED ORDER — ONDANSETRON HCL 4 MG PO TABS: 4.0000 mg | ORAL_TABLET | Freq: Four times a day (QID) | ORAL | Status: DC | PRN

## 2013-04-14 MED ORDER — LACTATED RINGERS IV SOLN
INTRAVENOUS | Status: DC | PRN
  Administered 2013-04-14 (×3): via INTRAVENOUS

## 2013-04-14 MED ORDER — BUPIVACAINE HCL (PF) 0.5 % IJ SOLN
INTRAMUSCULAR | Status: AC
  Filled 2013-04-14: qty 10

## 2013-04-14 MED ORDER — LAMOTRIGINE 150 MG PO TABS
150.0000 mg | ORAL_TABLET | Freq: Every day | ORAL | Status: DC
  Administered 2013-04-14 – 2013-04-15 (×2): 150 mg via ORAL
  Filled 2013-04-14 (×3): qty 1

## 2013-04-14 MED ORDER — DIPHENHYDRAMINE HCL 12.5 MG/5ML PO ELIX: 12.5000 mg | ORAL_SOLUTION | ORAL | Status: DC | PRN

## 2013-04-14 MED ORDER — OXYCODONE HCL 5 MG PO TABS: 5.0000 mg | ORAL_TABLET | Freq: Once | ORAL | Status: DC | PRN

## 2013-04-14 MED ORDER — POVIDONE-IODINE 7.5 % EX SOLN
Freq: Once | CUTANEOUS | Status: DC
  Filled 2013-04-14: qty 118

## 2013-04-14 MED ORDER — ZOLPIDEM TARTRATE 5 MG PO TABS: 5.0000 mg | ORAL_TABLET | Freq: Every evening | ORAL | Status: DC | PRN

## 2013-04-14 MED ORDER — QUETIAPINE FUMARATE 100 MG PO TABS
100.0000 mg | ORAL_TABLET | Freq: Three times a day (TID) | ORAL | Status: DC
  Administered 2013-04-14 – 2013-04-16 (×6): 100 mg via ORAL
  Filled 2013-04-14 (×9): qty 1

## 2013-04-14 MED ORDER — PROMETHAZINE HCL 25 MG/ML IJ SOLN
12.5000 mg | Freq: Four times a day (QID) | INTRAMUSCULAR | Status: DC | PRN
  Administered 2013-04-14: 12.5 mg via INTRAVENOUS
  Filled 2013-04-14: qty 1

## 2013-04-14 SURGICAL SUPPLY — 68 items
BANDAGE ESMARK 6X9 LF (GAUZE/BANDAGES/DRESSINGS) ×1 IMPLANT
BENZOIN TINCTURE PRP APPL 2/3 (GAUZE/BANDAGES/DRESSINGS) ×2 IMPLANT
BLADE SAGITTAL 25.0X1.19X90 (BLADE) ×2 IMPLANT
BLADE SAW SAG 90X13X1.27 (BLADE) ×2 IMPLANT
BNDG CMPR 9X6 STRL LF SNTH (GAUZE/BANDAGES/DRESSINGS) ×1
BNDG ESMARK 6X9 LF (GAUZE/BANDAGES/DRESSINGS) ×2
BOWL SMART MIX CTS (DISPOSABLE) ×2 IMPLANT
CAPT RP KNEE ×2 IMPLANT
CEMENT HV SMART SET (Cement) ×4 IMPLANT
CLOTH BEACON ORANGE TIMEOUT ST (SAFETY) IMPLANT
COVER SURGICAL LIGHT HANDLE (MISCELLANEOUS) ×2 IMPLANT
CUFF TOURNIQUET SINGLE 34IN LL (TOURNIQUET CUFF) ×2 IMPLANT
CUFF TOURNIQUET SINGLE 44IN (TOURNIQUET CUFF) IMPLANT
DRAPE EXTREMITY T 121X128X90 (DRAPE) ×2 IMPLANT
DRAPE U-SHAPE 47X51 STRL (DRAPES) ×2 IMPLANT
DRSG PAD ABDOMINAL 8X10 ST (GAUZE/BANDAGES/DRESSINGS) ×2 IMPLANT
DURAPREP 26ML APPLICATOR (WOUND CARE) ×2 IMPLANT
ELECT REM PT RETURN 9FT ADLT (ELECTROSURGICAL) ×2
ELECTRODE REM PT RTRN 9FT ADLT (ELECTROSURGICAL) ×1 IMPLANT
EVACUATOR 1/8 PVC DRAIN (DRAIN) ×2 IMPLANT
FACESHIELD LNG OPTICON STERILE (SAFETY) ×2 IMPLANT
GAUZE XEROFORM 5X9 LF (GAUZE/BANDAGES/DRESSINGS) ×2 IMPLANT
GLOVE BIOGEL PI IND STRL 6 (GLOVE) ×1 IMPLANT
GLOVE BIOGEL PI IND STRL 6.5 (GLOVE) ×2 IMPLANT
GLOVE BIOGEL PI IND STRL 8 (GLOVE) ×2 IMPLANT
GLOVE BIOGEL PI INDICATOR 6 (GLOVE) ×1
GLOVE BIOGEL PI INDICATOR 6.5 (GLOVE) ×2
GLOVE BIOGEL PI INDICATOR 8 (GLOVE) ×2
GLOVE ECLIPSE 7.5 STRL STRAW (GLOVE) IMPLANT
GLOVE SURG SS PI 6.5 STRL IVOR (GLOVE) ×2 IMPLANT
GLOVE SURG SS PI 7.5 STRL IVOR (GLOVE) ×4 IMPLANT
GOWN PREVENTION PLUS LG XLONG (DISPOSABLE) IMPLANT
GOWN STRL NON-REIN LRG LVL3 (GOWN DISPOSABLE) ×4 IMPLANT
GOWN STRL REIN XL XLG (GOWN DISPOSABLE) ×4 IMPLANT
HANDPIECE INTERPULSE COAX TIP (DISPOSABLE) ×2
HOOD PEEL AWAY FACE SHEILD DIS (HOOD) ×4 IMPLANT
IMMOBILIZER KNEE 20 (SOFTGOODS)
IMMOBILIZER KNEE 20 THIGH 36 (SOFTGOODS) IMPLANT
IMMOBILIZER KNEE 22 UNIV (SOFTGOODS) ×2 IMPLANT
IMMOBILIZER KNEE 24 THIGH 36 (MISCELLANEOUS) IMPLANT
IMMOBILIZER KNEE 24 UNIV (MISCELLANEOUS)
KIT BASIN OR (CUSTOM PROCEDURE TRAY) ×2 IMPLANT
KIT ROOM TURNOVER OR (KITS) ×2 IMPLANT
MANIFOLD NEPTUNE II (INSTRUMENTS) ×2 IMPLANT
NEEDLE 22X1 1/2 (OR ONLY) (NEEDLE) ×2 IMPLANT
NEEDLE HYPO 25GX1X1/2 BEV (NEEDLE) IMPLANT
NS IRRIG 1000ML POUR BTL (IV SOLUTION) ×2 IMPLANT
PACK TOTAL JOINT (CUSTOM PROCEDURE TRAY) ×2 IMPLANT
PAD ARMBOARD 7.5X6 YLW CONV (MISCELLANEOUS) ×4 IMPLANT
PAD CAST 4YDX4 CTTN HI CHSV (CAST SUPPLIES) ×1 IMPLANT
PADDING CAST COTTON 4X4 STRL (CAST SUPPLIES) ×1
SET HNDPC FAN SPRY TIP SCT (DISPOSABLE) ×1 IMPLANT
SPONGE GAUZE 4X4 12PLY (GAUZE/BANDAGES/DRESSINGS) ×2 IMPLANT
STAPLER VISISTAT 35W (STAPLE) IMPLANT
STRIP CLOSURE SKIN 1/2X4 (GAUZE/BANDAGES/DRESSINGS) ×2 IMPLANT
SUCTION FRAZIER TIP 10 FR DISP (SUCTIONS) IMPLANT
SUT MON AB 3-0 SH 27 (SUTURE) ×1
SUT MON AB 3-0 SH27 (SUTURE) ×1 IMPLANT
SUT VIC AB 0 CTB1 27 (SUTURE) ×4 IMPLANT
SUT VIC AB 1 CT1 27 (SUTURE) ×4
SUT VIC AB 1 CT1 27XBRD ANBCTR (SUTURE) ×2 IMPLANT
SUT VIC AB 2-0 CTB1 (SUTURE) ×4 IMPLANT
SYR CONTROL 10ML LL (SYRINGE) ×2 IMPLANT
TOWEL OR 17X24 6PK STRL BLUE (TOWEL DISPOSABLE) ×2 IMPLANT
TOWEL OR 17X26 10 PK STRL BLUE (TOWEL DISPOSABLE) ×2 IMPLANT
TRAY FOLEY CATH 14FR (SET/KITS/TRAYS/PACK) ×2 IMPLANT
TRAY FOLEY CATH 16FRSI W/METER (SET/KITS/TRAYS/PACK) IMPLANT
WATER STERILE IRR 1000ML POUR (IV SOLUTION) ×4 IMPLANT

## 2013-04-14 NOTE — Evaluation (Signed)
Physical Therapy Evaluation Patient Details Name: [name] MRN: [id number] DOB: 09/08/1955 Today's Date: 04/14/2013 Time: 5784-6962 PT Time Calculation (min): 23 min  PT Assessment / Plan / Recommendation History of Present Illness  s/p elective Rt TKA   Clinical Impression  Pt is s/p Rt TKA POD#0 resulting in the deficits listed below (see PT Problem List).  Pt will benefit from skilled PT to increase their independence and safety with mobility to allow discharge to the venue listed below. Pt unable to amb this session due to Rt LE being numb and buckling. Pt was vomiting and nauseous at beginning of session. Reported she felt better sitting in upright position. Anticipate good progress. Pt hopes to D/C Sunday.      PT Assessment  Patient needs continued PT services    Follow Up Recommendations  Home health PT;Supervision/Assistance - 24 hour    Does the patient have the potential to tolerate intense rehabilitation      Barriers to Discharge        Equipment Recommendations  None recommended by PT    Recommendations for Other Services OT consult   Frequency 7X/week    Precautions / Restrictions Precautions Precautions: Fall;Knee Precaution Comments: pt given TKA HEP protocol  Required Braces or Orthoses: Knee Immobilizer - Right Knee Immobilizer - Right: On when out of bed or walking Restrictions Weight Bearing Restrictions: Yes RLE Weight Bearing: Weight bearing as tolerated   Pertinent Vitals/Pain 4-5/10; patient repositioned for comfort       Mobility  Bed Mobility Bed Mobility: Supine to Sit;Sitting - Scoot to Edge of Bed Supine to Sit: 4: Min assist;HOB elevated;With rails Sitting - Scoot to Edge of Bed: 5: Supervision Details for Bed Mobility Assistance: (A) to advance Rt LE off EOB; pt was able to initiate movement but required (A) to complete; cues for hand placement and sequencing; incr time due to nausea  Transfers Transfers: Sit to Stand;Stand  to Sit;Stand Pivot Transfers Sit to Stand: 4: Min assist;From bed;With upper extremity assist Stand to Sit: 4: Min assist;To chair/3-in-1;With armrests;With upper extremity assist Stand Pivot Transfers: 4: Min assist;From elevated surface;With armrests Details for Transfer Assistance: (A) to maintain balance due to decr ability to WB through Rt LE due to pain and Rt LE was buckling and "numb"; cues for hand placement and management of RW  Ambulation/Gait Ambulation/Gait Assistance: Not tested (comment) (Rt LE buckling and "numb") Stairs: No Wheelchair Mobility Wheelchair Mobility: No    Exercises Total Joint Exercises Ankle Circles/Pumps: Both;AROM;10 reps;Supine   PT Diagnosis: Difficulty walking;Acute pain  PT Problem List: Decreased strength;Decreased range of motion;Decreased balance;Decreased mobility;Decreased knowledge of use of DME;Pain;Impaired sensation PT Treatment Interventions: DME instruction;Gait training;Functional mobility training;Stair training;Therapeutic activities;Therapeutic exercise;Balance training;Neuromuscular re-education;Patient/family education     PT Goals(Current goals can be found in the care plan section) Acute Rehab PT Goals Patient Stated Goal: to not have nausea  PT Goal Formulation: With patient Time For Goal Achievement: 04/21/13 Potential to Achieve Goals: Good  Visit Information  Last PT Received On: 04/14/13 Assistance Needed: +1 History of Present Illness: s/p elective Rt TKA        Prior Functioning  Home Living Family/patient expects to be discharged to:: Private residence Living Arrangements: Spouse/significant other Available Help at Discharge: Family;Available 24 hours/day Type of Home: House Home Access: Stairs to enter [employer] of Steps: 3 Entrance Stairs-Rails: Can reach both Home Layout: One level Home Equipment: Walker - 2 wheels;Bedside commode Additional Comments: pt has tub shower at  home  Prior  Function Level of Independence: Independent Comments: pt stays at home and keeps grandson Communication Communication: No difficulties Dominant Hand: Right    Cognition  Cognition Arousal/Alertness: Awake/alert Behavior During Therapy: WFL for tasks assessed/performed Overall Cognitive Status: Within Functional Limits for tasks assessed    Extremity/Trunk Assessment Upper Extremity Assessment Upper Extremity Assessment: Defer to OT evaluation Lower Extremity Assessment Lower Extremity Assessment: RLE deficits/detail RLE: Unable to fully assess due to pain RLE Sensation: decreased light touch Cervical / Trunk Assessment Cervical / Trunk Assessment: Normal   Balance Balance Balance Assessed: Yes Static Sitting Balance Static Sitting - Balance Support: Feet unsupported;Bilateral upper extremity supported Static Sitting - Level of Assistance: 5: Stand by assistance Static Sitting - Comment/# of Minutes: pt tolerated sitting EOB ~8 min for deep breathing; to prepare for gt and for nausea to subside   End of Session PT - End of Session Equipment Utilized During Treatment: Gait belt;Right knee immobilizer Activity Tolerance: Patient tolerated treatment well Patient left: in chair;with call bell/phone within reach Nurse Communication: Mobility status CPM Right Knee CPM Right Knee: On Right Knee Flexion (Degrees): 60 Right Knee Extension (Degrees): 0  GP     Shelva Majestic [place], [place] [phone] 04/14/2013, 1:40 PM

## 2013-04-14 NOTE — Anesthesia Postprocedure Evaluation (Signed)
  Anesthesia Post-op Note  Patient: [name]  Procedure(s) Performed: Procedure(s): RIGHT TOTAL KNEE ARTHROPLASTY AND LEFT KNEE INJECTION  (Right)  Patient Location: PACU  Anesthesia Type:GA combined with regional for post-op pain  Level of Consciousness: awake, alert , oriented and patient cooperative  Airway and Oxygen Therapy: Patient Spontanous Breathing and Patient connected to nasal cannula oxygen  Post-op Pain: mild  Post-op Assessment: Post-op Vital signs reviewed, Patient's Cardiovascular Status Stable, Respiratory Function Stable, Patent Airway, No signs of Nausea or vomiting and Pain level controlled  Post-op Vital Signs: Reviewed and stable  Complications: No apparent anesthesia complications

## 2013-04-14 NOTE — Progress Notes (Signed)
Orthopedic Tech Progress Note Patient Details:  [name] [birth year] [id number]  CPM Right Knee CPM Right Knee: On Right Knee Flexion (Degrees): 60 Right Knee Extension (Degrees): 0   Cammer, Mickie Bail 04/14/2013, 10:44 AM

## 2013-04-14 NOTE — Op Note (Signed)
[name], [name]               ACCOUNT NO.:  [id number]  MEDICAL RECORD NO.:  [id number]  LOCATION:  [facility]                         FACILITY:  [facility]  PHYSICIAN:  Harvie Junior, M.D.   DATE OF BIRTH:  [birth year]  DATE OF PROCEDURE:  04/14/2013 DATE OF DISCHARGE:                              OPERATIVE REPORT   PREOPERATIVE DIAGNOSES: 1. End stage degenerative joint disease, right knee. 2. Severe left knee degenerative change.  POSTOPERATIVE DIAGNOSES: 1. End stage degenerative joint disease, right knee. 2. Severe left knee degenerative change.  PROCEDURE:  Right total knee replacement with a Sigma system, size 2.5 femur, size 2.5 tibia, 12.5 mm bridging bearing, and a 32 mm all polyethylene patella.  Injection, left knee with 6 mL Marcaine and 2 mL betamethasone.  SURGEON:  Harvie Junior, M.D.  ASSISTANT:  Marshia Ly, PA  ANESTHESIA:  General.  BRIEF HISTORY:  Ms. [name] is a 57 year old female with a long history of severe arthritic pain in her right knee.  She had been treated conservatively for a period of time with injection therapy, activity modification, physical therapy, and after failure of conservative care, she was taken to the operating room for right total knee replacement with x-ray showing bone-on-bone change and the patient having night pain and light activity pain.  She was taken to the operating room for this procedure.  PROCEDURE IN DETAIL:  The patient was brought to the operating room and after adequate anesthesia was obtained with general anesthetic, the patient was placed supine on the operating table.  The left leg was then injected with 6 mL Marcaine and 2 mL betamethasone.  Attention turned to the right knee, which was prepped and draped in usual sterile fashion. Following this, the leg was exsanguinated.  Blood pressure tourniquet inflated to 350 mmHg.  Following this, a midline incision was made in the subcutaneous tissue down to the  level of the extensor mechanism and medial parapatellar arthrotomy was undertaken.  Following this, attention was turned towards the knee where the medial lateral meniscus was removed, retropatellar fat pad, synovium in the anterior aspect of the femur, and the anterior and posterior cruciates.  Following this, retractors were put in place.  The tibia was cut perpendicular to its long axis with an extramedullary alignment guide.  Attention was then turned to the intramedullary femoral alignment guide with a 5-degree valgus alignment.  The distal femur was cut.  Following this, spacer blocks were put in place.  The 10 spacer block when easy into full extension at this point.  Attention was then turned towards the femur, which was sized to a 2.5.  A block was put in place and it is anterior and posterior cuts were made, chamfers and box.  Attention was turned to the tibia, sized to a 2.5 was drilled and keeled.  Trials were put in place and the attention turned to the patella, cut down to a level of 14 mm and a lugs were drilled for the 32 paddle and once this was done, the attention was turned back towards the excellent range of motion and stability at this point.  Attention was then turned towards the knee where  the final trial components were removed.  The final components and cemented in place size 2.5 femur, size 2.5 tibia, 12 mm bridging bearing and a 32 mm all polyethylene patella and all put into place and the patella was held with a clamp.  All excess bone cement was removed.  At this point, the cement allowed to dry completely and the tourniquet let down.  All bleeding was controlled with electrocautery.  We then trialed with a 10 poly, put a 12.5 poly and got a good extension better flexion balance.  The knee was then irrigated, suctioned dry, closed in layers over Hemovac drain, and the final poly had been place at this point. The medial parapatellar trial was closed with 1  Vicryl, skin with 0 and 2-0 Vicryl and 3-0 Monocryl subcuticular.  Benzoin and Steri-Strips were applied.  Sterile compressive dressing was applied and the patient was taken to the recovery room and was noted to be in satisfactory condition.  Estimated blood loss for the procedure was none.     Harvie Junior, M.D.     Ranae Plumber  D:  04/14/2013  T:  04/14/2013  Job:  [id number]

## 2013-04-14 NOTE — Preoperative (Signed)
Beta Blockers   Reason not to administer Beta Blockers:Not Applicable 

## 2013-04-14 NOTE — Anesthesia Preprocedure Evaluation (Addendum)
Anesthesia Evaluation  Patient identified by MRN, date of birth, ID band Patient awake    Reviewed: Allergy & Precautions, H&P , NPO status , Patient's Chart, lab work & pertinent test results, reviewed documented beta blocker date and time   History of Anesthesia Complications Negative for: history of anesthetic complications  Airway Mallampati: II TM Distance: >3 FB Neck ROM: Full    Dental  (+) Teeth Intact and Dental Advisory Given   Pulmonary sleep apnea and Continuous Positive Airway Pressure Ventilation ,  breath sounds clear to auscultation  Pulmonary exam normal       Cardiovascular hypertension, Pt. on medications Rhythm:Regular Rate:Normal     Neuro/Psych negative neurological ROS     GI/Hepatic negative GI ROS, Neg liver ROS,   Endo/Other  negative endocrine ROS  Renal/GU negative Renal ROS     Musculoskeletal  (+) Arthritis -, Osteoarthritis,    Abdominal (+) + obese,   Peds  Hematology   Anesthesia Other Findings   Reproductive/Obstetrics                         Anesthesia Physical Anesthesia Plan  ASA: III  Anesthesia Plan: General   Post-op Pain Management:    Induction: Intravenous  Airway Management Planned: LMA  Additional Equipment:   Intra-op Plan:   Post-operative Plan:   Informed Consent: I have reviewed the patients History and Physical, chart, labs and discussed the procedure including the risks, benefits and alternatives for the proposed anesthesia with the patient or authorized representative who has indicated his/her understanding and acceptance.   Dental advisory given  Plan Discussed with: CRNA and Surgeon  Anesthesia Plan Comments: (Plan routine monitors, GA- LMA OK, femoral nerve block for post op analgesia)        Anesthesia Quick Evaluation

## 2013-04-14 NOTE — Care Management Utilization Note (Signed)
Utilization review completed. Zaelyn Barbary, RN BSN 

## 2013-04-14 NOTE — Brief Op Note (Signed)
04/14/2013  9:12 AM  PATIENT:  [name]  57 y.o. female  PRE-OPERATIVE DIAGNOSIS:  degenerative joint disease bilateral knees  POST-OPERATIVE DIAGNOSIS:  degenerative joint disease bilateral knees  PROCEDURE:  Procedure(s): RIGHT TOTAL KNEE ARTHROPLASTY AND LEFT KNEE INJECTION  (Right)  SURGEON:  Surgeon(s) and Role:    * Harvie Junior, MD - Primary  PHYSICIAN ASSISTANT:   ASSISTANTS: bethune   ANESTHESIA:   general  EBL:  Total I/O In: 1150 [I.V.:1150] Out: 600 [Urine:450; Blood:150]  BLOOD ADMINISTERED:none  DRAINS: (1) Hemovact drain(s) in the r knee  with  Suction Open   LOCAL MEDICATIONS USED:  NONE  SPECIMEN:  No Specimen  DISPOSITION OF SPECIMEN:  N/A  COUNTS:  YES  TOURNIQUET:   Total Tourniquet Time Documented: Thigh (Right) - 53 minutes Total: Thigh (Right) - 53 minutes   DICTATION: .Other Dictation: Dictation Number [id number]  PLAN OF CARE: Admit to inpatient   PATIENT DISPOSITION:  PACU - hemodynamically stable.   Delay start of Pharmacological VTE agent (>24hrs) due to surgical blood loss or risk of bleeding: no

## 2013-04-14 NOTE — H&P (Signed)
TOTAL KNEE ADMISSION H&P  Patient is being admitted for right total knee arthroplasty.  Subjective:  Chief Complaint:right knee pain.  HPI: [name], 57 y.o. female, has a history of pain and functional disability in the right knee due to arthritis and has failed non-surgical conservative treatments for greater than 12 weeks to includeNSAID's and/or analgesics, corticosteriod injections, viscosupplementation injections, supervised PT with diminished ADL's post treatment, use of assistive devices, weight reduction as appropriate and activity modification.  Onset of symptoms was gradual, starting 5 years ago with gradually worsening course since that time. The patient noted no past surgery on the right knee(s).  Patient currently rates pain in the right knee(s) at 8 out of 10 with activity. Patient has night pain, worsening of pain with activity and weight bearing, pain that interferes with activities of daily living, pain with passive range of motion, crepitus and joint swelling.  Patient has evidence of subchondral sclerosis, joint subluxation and joint space narrowing by imaging studies. This patient has had failure of conservative care. There is no active infection.  There are no active problems to display for this patient.  Past Medical History  Diagnosis Date  . Hypertension   . Sleep apnea     cpap   > 4 yrs  last sleep study  . Arthritis     Past Surgical History  Procedure Laterality Date  . Abdominal hysterectomy    . Joint replacement      arthroscopy    Prescriptions prior to admission  Medication Sig Dispense Refill  . acetaminophen (TYLENOL) 500 MG tablet Take 500-1,000 mg by mouth every 6 (six) hours as needed for pain.      Marland Kitchen aspirin EC 81 MG tablet Take 81 mg by mouth at bedtime.      . cetirizine (ZYRTEC) 10 MG tablet Take 10 mg by mouth every morning.      . Cholecalciferol (VITAMIN D) 2000 UNITS tablet Take 8,000 Units by mouth daily with lunch.      .  Glucosamine-Chondroitin (MOVE FREE PO) Take 2 tablets by mouth daily.      . hydrochlorothiazide (HYDRODIURIL) 25 MG tablet Take 25 mg by mouth every morning.      . lamoTRIgine (LAMICTAL) 150 MG tablet Take 150 mg by mouth at bedtime.      . Magnesium 250 MG TABS Take 500 mg by mouth 2 (two) times daily.       . Multiple Vitamin (MULTIVITAMIN WITH MINERALS) TABS tablet Take 0.5 tablets by mouth 2 (two) times daily.      . Omega-3 Fatty Acids (FISH OIL) 1000 MG CAPS Take 1,000 mg by mouth every morning.      Marland Kitchen QUEtiapine (SEROQUEL) 100 MG tablet Take 100 mg by mouth 3 (three) times daily.      . rosuvastatin (CRESTOR) 40 MG tablet Take 20 mg by mouth 3 (three) times a week. On Monday, Wednesday, and friday      . traMADol (ULTRAM) 50 MG tablet Take 50 mg by mouth every 6 (six) hours as needed for pain.       Allergies  Allergen Reactions  . Latex     Band aids= if left on for extended period of time  . Celecoxib     REACTION: Rash (celebrex)  . Cephalexin     REACTION: Rash (keflex)  . Codeine     REACTION: Rash  . Erythromycin     REACTION: Rash (emycin)  . Sulfonamide Derivatives  REACTION: Rash  . 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 Smoking status: Never Smoker   . Smokeless tobacco: Not on file  . Alcohol Use: No    History reviewed. No pertinent family history.   ROS ROS: I have reviewed the patient's review of systems thoroughly and there are no positive responses as relates to the HPI. Objective:  Physical Exam  Vital signs in last 24 hours: Temp:  [97.3 F (36.3 C)] 97.3 F (36.3 C) (10/24 0557) Pulse Rate:  [91] 91 (10/24 0557) Resp:  [18] 18 (10/24 0557) BP: (116)/(54) 116/54 mmHg (10/24 0557) SpO2:  [97 %] 97 % (10/24 0557) Well-developed well-nourished patient in no acute distress. Alert and oriented x3 HEENT:within normal limits Cardiac: Regular rate and rhythm Pulmonary: Lungs clear to  auscultation Abdomen: Soft and nontender.  Normal active bowel sounds  Musculoskeletal:r knee : painful rom// rom0-110// no instability Labs: Recent Results (from the past 2160 hour(s))  URINALYSIS, ROUTINE W REFLEX MICROSCOPIC     Status: None   Collection Time    04/05/13  1:11 PM      Result Value Range   Color, Urine YELLOW  YELLOW   APPearance CLEAR  CLEAR   Specific Gravity, Urine 1.014  1.005 - 1.030   pH 7.5  5.0 - 8.0   Glucose, UA NEGATIVE  NEGATIVE mg/dL   Hgb urine dipstick NEGATIVE  NEGATIVE   Bilirubin Urine NEGATIVE  NEGATIVE   Ketones, ur NEGATIVE  NEGATIVE mg/dL   Protein, ur NEGATIVE  NEGATIVE mg/dL   Urobilinogen, UA 0.2  0.0 - 1.0 mg/dL   Nitrite NEGATIVE  NEGATIVE   Leukocytes, UA NEGATIVE  NEGATIVE   Comment: MICROSCOPIC NOT DONE ON URINES WITH NEGATIVE PROTEIN, BLOOD, LEUKOCYTES, NITRITE, OR GLUCOSE <1000 mg/dL.  APTT     Status: None   Collection Time    04/05/13  1:15 PM      Result Value Range   aPTT 34  24 - 37 seconds  CBC WITH DIFFERENTIAL     Status: None   Collection Time    04/05/13  1:15 PM      Result Value Range   WBC 6.9  4.0 - 10.5 K/uL   RBC 4.50  3.87 - 5.11 MIL/uL   Hemoglobin 14.1  12.0 - 15.0 g/dL   HCT 08.6  57.8 - 46.9 %   MCV 90.7  78.0 - 100.0 fL   MCH 31.3  26.0 - 34.0 pg   MCHC 34.6  30.0 - 36.0 g/dL   RDW 62.9  52.8 - 41.3 %   Platelets 316  150 - 400 K/uL   Neutrophils Relative % 50  43 - 77 %   Neutro Abs 3.4  1.7 - 7.7 K/uL   Lymphocytes Relative 37  12 - 46 %   Lymphs Abs 2.5  0.7 - 4.0 K/uL   Monocytes Relative 11  3 - 12 %   Monocytes Absolute 0.7  0.1 - 1.0 K/uL   Eosinophils Relative 3  0 - 5 %   Eosinophils Absolute 0.2  0.0 - 0.7 K/uL   Basophils Relative 0  0 - 1 %   Basophils Absolute 0.0  0.0 - 0.1 K/uL  COMPREHENSIVE METABOLIC PANEL     Status: Abnormal   Collection Time    04/05/13  1:15 PM      Result Value Range   Sodium 141  135 - 145  mEq/L   Potassium 3.1 (*) 3.5 - 5.1 mEq/L   Chloride 101  96  - 112 mEq/L   CO2 28  19 - 32 mEq/L   Glucose, Bld 71  70 - 99 mg/dL   BUN 16  6 - 23 mg/dL   Creatinine, Ser 1.61  0.50 - 1.10 mg/dL   Calcium 9.8  8.4 - 09.6 mg/dL   Total Protein 7.8  6.0 - 8.3 g/dL   Albumin 4.4  3.5 - 5.2 g/dL   AST 23  0 - 37 U/L   ALT 31  0 - 35 U/L   Alkaline Phosphatase 114  39 - 117 U/L   Total Bilirubin 0.4  0.3 - 1.2 mg/dL   GFR calc non Af Amer >90  >90 mL/min   GFR calc Af Amer >90  >90 mL/min   Comment: (NOTE)     The eGFR has been calculated using the CKD EPI equation.     This calculation has not been validated in all clinical situations.     eGFR's persistently <90 mL/min signify possible Chronic Kidney     Disease.  PROTIME-INR     Status: None   Collection Time    04/05/13  1:15 PM      Result Value Range   Prothrombin Time 13.5  11.6 - 15.2 seconds   INR 1.05  0.00 - 1.49  TYPE AND SCREEN     Status: None   Collection Time    04/05/13  1:17 PM      Result Value Range   ABO/RH(D) O POS     Antibody Screen NEG     Sample Expiration 04/19/2013    ABO/RH     Status: None   Collection Time    04/05/13  1:17 PM      Result Value Range   ABO/RH(D) O POS    SURGICAL PCR SCREEN     Status: None   Collection Time    04/05/13  1:18 PM      Result Value Range   MRSA, PCR NEGATIVE  NEGATIVE   Staphylococcus aureus NEGATIVE  NEGATIVE   Comment:            The Xpert SA Assay (FDA     approved for NASAL specimens     in patients over 31 years of age),     is one component of     a comprehensive surveillance     program.  Test performance has     been validated by [employer] for patients greater     than or equal to 98 year old.     It is not intended     to diagnose infection nor to     guide or monitor treatment.    There is no weight on file to calculate BMI.   Imaging Review Plain radiographs demonstrate severe degenerative joint disease of the right knee(s). The overall alignment ismild varus. The bone quality appears to be  good for age and reported activity level.  Assessment/Plan:  End stage arthritis, right knee   The patient history, physical examination, clinical judgment of the provider and imaging studies are consistent with end stage degenerative joint disease of the right knee(s) and total knee arthroplasty is deemed medically necessary. The treatment options including medical management, injection therapy arthroscopy and arthroplasty were discussed at length. The risks and benefits of total knee arthroplasty were presented and reviewed. The risks due to  aseptic loosening, infection, stiffness, patella tracking problems, thromboembolic complications and other imponderables were discussed. The patient acknowledged the explanation, agreed to proceed with the plan and consent was signed. Patient is being admitted for inpatient treatment for surgery, pain control, PT, OT, prophylactic antibiotics, VTE prophylaxis, progressive ambulation and ADL's and discharge planning. The patient is planning to be discharged home with home health services

## 2013-04-14 NOTE — Anesthesia Procedure Notes (Addendum)
Anesthesia Regional Block:  Femoral nerve block  Pre-Anesthetic Checklist: ,, timeout performed, Correct Patient, Correct Site, Correct Laterality, Correct Procedure, Correct Position, site marked, Risks and benefits discussed,  Surgical consent,  Pre-op evaluation,  At surgeon's request and post-op pain management  Laterality: Right  Prep: chloraprep       Needles:  Injection technique: Single-shot  Needle Type: Echogenic Stimulator Needle      Needle Gauge: 22 and 22 G    Additional Needles:  Procedures: nerve stimulator Femoral nerve block  Nerve Stimulator or Paresthesia:  Response: patella twitch, 0.45 mA, 0.1 ms,   Additional Responses:   Narrative:  Start time: 04/14/2013 7:09 AM End time: 04/14/2013 7:14 AM Injection made incrementally with aspirations every 5 mL.  Performed by: Personally  Anesthesiologist: Sandford Craze, MD  Additional Notes: Pt identified in Holding room.  Monitors applied. Working IV access confirmed. Sterile prep R groin.  #22ga PNS to patella twitch at 0.13mA threshold.  30cc 0.5% Bupivacaine with 1:200k epi injected incrementally after negative test dose.  Patient asymptomatic, VSS, no heme aspirated, tolerated well.  Sandford Craze, MD  Femoral nerve block

## 2013-04-14 NOTE — Transfer of Care (Signed)
Immediate Anesthesia Transfer of Care Note  Patient: [name]  Procedure(s) Performed: Procedure(s): RIGHT TOTAL KNEE ARTHROPLASTY AND LEFT KNEE INJECTION  (Right)  Patient Location: PACU  Anesthesia Type:General  Level of Consciousness: awake and patient cooperative  Airway & Oxygen Therapy: Patient Spontanous Breathing, Patient connected to nasal cannula oxygen and shivering  Post-op Assessment: Report given to PACU RN, Post -op Vital signs reviewed and stable and Patient moving all extremities  Post vital signs: Reviewed and stable  Complications: No apparent anesthesia complications

## 2013-04-15 LAB — BASIC METABOLIC PANEL
Calcium: 9.5 mg/dL (ref 8.4–10.5)
GFR calc non Af Amer: >90 mL/min (ref >90)
Glucose, Bld: 157 mg/dL — ABNORMAL HIGH (ref 70–99)
Sodium: 138 mEq/L (ref 135–145)

## 2013-04-15 LAB — CBC
Hemoglobin: 12.7 g/dL (ref 12.0–15.0)
MCH: 31.1 pg (ref 26.0–34.0)
MCHC: 34.5 g/dL (ref 30.0–36.0)
Platelets: 326 10*3/uL (ref 150–400)
RBC: 4.08 MIL/uL (ref 3.87–5.11)
RDW: 13.5 % (ref 11.5–15.5)

## 2013-04-15 NOTE — Progress Notes (Signed)
Physical Therapy Treatment Patient Details Name: [name] MRN: [id number] DOB: 24-Jan-1956 Today's Date: 04/15/2013 Time: 4098-1191 PT Time Calculation (min): 27 min  PT Assessment / Plan / Recommendation  History of Present Illness s/p elective Rt TKA    PT Comments   Pt progressing very well with PT. Session this afternoon focused on increasing ambulation distance and independence as well as focused on stair amb and theraex. Pt hopeful to D/C tomorrow. Will cont to f/u with pt as per POC.   Follow Up Recommendations  Home health PT;Supervision/Assistance - 24 hour     Does the patient have the potential to tolerate intense rehabilitation     Barriers to Discharge        Equipment Recommendations  None recommended by PT    Recommendations for Other Services    Frequency 7X/week   Progress towards PT Goals Progress towards PT goals: Progressing toward goals  Plan Current plan remains appropriate    Precautions / Restrictions Precautions Precautions: Knee Required Braces or Orthoses: Knee Immobilizer - Right Knee Immobilizer - Right: On when out of bed or walking Restrictions Weight Bearing Restrictions: Yes RLE Weight Bearing: Weight bearing as tolerated   Pertinent Vitals/Pain 3/10; premedicated     Mobility  Bed Mobility Bed Mobility: Supine to Sit;Sitting - Scoot to Edge of Bed;Sit to Supine Supine to Sit: 5: Supervision;HOB flat Sitting - Scoot to Edge of Bed: 6: Modified independent (Device/Increase time) Sit to Supine: 4: Min guard;HOB flat;With rail Details for Bed Mobility Assistance: incr time to complete supine to sit; min guard to complete sit to supine; cues for hand placement and sequencing  Transfers Transfers: Sit to Stand;Stand to Sit Sit to Stand: From bed;From chair/3-in-1;With armrests;5: Supervision Stand to Sit: 5: Supervision;To bed;To chair/3-in-1;With armrests Details for Transfer Assistance: cues for hand placement and safety with RW:  pt attempts to stand up at times without RW; encouraged to have RW with all transfers; pt verbalized understanding  Ambulation/Gait Ambulation/Gait Assistance: 4: Min guard Ambulation Distance (Feet): 220 Feet Assistive device: Rolling walker Ambulation/Gait Assistance Details: cues for gt sequencing and upright posture; pt continues step to gt; progressed to step through with cues Gait Pattern: Step-to pattern;Decreased stance time - right;Decreased step length - left Gait velocity: decreased Stairs: Yes Stairs Assistance: 4: Min guard Stairs Assistance Details (indicate cue type and reason): cues for stair amb sequencing and min guard to steady; husband present for education on guarding technique  Stair Management Technique: Two rails;Step to pattern;Forwards Number of Stairs: 5 Wheelchair Mobility Wheelchair Mobility: No    Exercises Total Joint Exercises Ankle Circles/Pumps: Both;AROM;10 reps;Supine Quad Sets: AROM;Right;10 reps;Supine Heel Slides: AAROM;Right;10 reps;Supine;Other (comment) (with UE (A) ) Hip ABduction/ADduction: AAROM;Right;Strengthening;10 reps;Supine (with use of UE) Straight Leg Raises: AAROM;Right;10 reps;Supine (with use of UE )   PT Diagnosis:    PT Problem List:   PT Treatment Interventions:     PT Goals (current goals can now be found in the care plan section) Acute Rehab PT Goals Patient Stated Goal: to get moving PT Goal Formulation: With patient Time For Goal Achievement: 04/21/13 Potential to Achieve Goals: Good  Visit Information  Last PT Received On: 04/15/13 Assistance Needed: +1 History of Present Illness: s/p elective Rt TKA     Subjective Data  Subjective: pt lying supine in CPM; agreeable to therapy. " we can try the steps now if you want"  Patient Stated Goal: to get moving   Cognition  Cognition Arousal/Alertness: Awake/alert Behavior During  Therapy: WFL for tasks assessed/performed Overall Cognitive Status: Within Functional  Limits for tasks assessed    Balance  Balance Balance Assessed: No  End of Session PT - End of Session Equipment Utilized During Treatment: Gait belt;Right knee immobilizer Activity Tolerance: Patient tolerated treatment well Patient left: in bed;with call bell/phone within reach;with family/visitor present Nurse Communication: Mobility status CPM Right Knee CPM Right Knee: Off Right Knee Flexion (Degrees): 60 Right Knee Extension (Degrees): 0   GP     Shelva Majestic [place], [place] [phone] 04/15/2013, 1:59 PM

## 2013-04-15 NOTE — Progress Notes (Signed)
Physical Therapy Treatment Patient Details Name: [name] MRN: [id number] DOB: May 27, 1956 Today's Date: 04/15/2013 Time: 4098-1191 PT Time Calculation (min): 25 min  PT Assessment / Plan / Recommendation  History of Present Illness s/p elective Rt TKA    PT Comments   Pt progressing towards goals with therapy. Will plan to address stair amb goal next session and increase mobility and exercises. Pt hopeful to D/C home tomorrow.   Follow Up Recommendations  Home health PT;Supervision/Assistance - 24 hour     Does the patient have the potential to tolerate intense rehabilitation     Barriers to Discharge        Equipment Recommendations  None recommended by PT    Recommendations for Other Services OT consult  Frequency 7X/week   Progress towards PT Goals Progress towards PT goals: Progressing toward goals  Plan Current plan remains appropriate    Precautions / Restrictions Precautions Precautions: Fall;Knee Required Braces or Orthoses: Knee Immobilizer - Right Knee Immobilizer - Right: On when out of bed or walking Restrictions Weight Bearing Restrictions: Yes RLE Weight Bearing: Weight bearing as tolerated   Pertinent Vitals/Pain 2/10; "top of knee" describes pain as "pressure" pt premedicated    Mobility  Bed Mobility Bed Mobility: Supine to Sit;Sitting - Scoot to Edge of Bed Supine to Sit: 5: Supervision;HOB elevated;With rails Sitting - Scoot to Edge of Bed: 5: Supervision Details for Bed Mobility Assistance: no phyical (A) needed; cues to use bil UEs to advance Rt LE; incr time  Transfers Transfers: Sit to Stand;Stand to Sit Sit to Stand: 4: Min guard;From bed Stand to Sit: 4: Min guard;To chair/3-in-1;Without upper extremity assist Details for Transfer Assistance: min guard to steady and for safety; cues for hand placement and safety Ambulation/Gait Ambulation/Gait Assistance: 4: Min guard Ambulation Distance (Feet): 100 Feet Assistive device: Rolling  walker Ambulation/Gait Assistance Details: cues for gt sequencing and RW management  Gait Pattern: Step-to pattern;Decreased stance time - right;Decreased step length - left Gait velocity: decreased Stairs: No Wheelchair Mobility Wheelchair Mobility: No    Exercises Total Joint Exercises Ankle Circles/Pumps: Both;AROM;10 reps;Supine Hip ABduction/ADduction: AAROM;Right;Strengthening;10 reps;Seated Long Arc Quad: AAROM;Right;10 reps;Seated Knee Flexion: AAROM;Right;10 reps;Seated Goniometric ROM: Rt knee AROM 80 degrees in sitting   PT Diagnosis:    PT Problem List:   PT Treatment Interventions:     PT Goals (current goals can now be found in the care plan section) Acute Rehab PT Goals Patient Stated Goal: to get moving PT Goal Formulation: With patient Time For Goal Achievement: 04/21/13 Potential to Achieve Goals: Good  Visit Information  Last PT Received On: 04/15/13 Assistance Needed: +1 History of Present Illness: s/p elective Rt TKA     Subjective Data  Subjective: pt lying supine; " ive been up a few times. i had to pee every 2 hours." agreeable to therapy Patient Stated Goal: to get moving   Cognition  Cognition Arousal/Alertness: Awake/alert Behavior During Therapy: WFL for tasks assessed/performed Overall Cognitive Status: Within Functional Limits for tasks assessed    Balance  Balance Balance Assessed: No  End of Session PT - End of Session Equipment Utilized During Treatment: Gait belt;Right knee immobilizer Activity Tolerance: Patient tolerated treatment well Patient left: Other (comment);with call bell/phone within reach (3 in 1 with nurse tech in bathroom) Nurse Communication: Mobility status   GP     Donell Sievert,  [phone] 04/15/2013, 8:25 AM

## 2013-04-15 NOTE — Progress Notes (Signed)
Patient refused to wear CPAP tonight.  Was told if she changed her mind to call RT. 

## 2013-04-15 NOTE — Progress Notes (Signed)
  Subjective: 10-24 = R TKA Foley d/c's, sitting in chair, tol PO solids at bfast, Good 1st therapy session Confirms desire to d/c home tomorrow  Objective: Vital signs in last 24 hours: Temp:  [97.3 F (36.3 C)-98.5 F (36.9 C)] 98.5 F (36.9 C) (10/25 0357) Pulse Rate:  [85-103] 85 (10/25 0357) Resp:  [16-21] 16 (10/25 0357) BP: (118-130)/(50-66) 118/50 mmHg (10/25 0357) SpO2:  [92 %-98 %] 94 % (10/25 0357)  Intake/Output from previous day: 10/24 0701 - 10/25 0700 In: 2990 [P.O.:840; I.V.:2150] Out: 1375 [Urine:1050; Drains:175; Blood:150] Intake/Output this shift: Total I/O In: 240 [P.O.:240] Out: 75 [Drains:75]   Recent Labs  04/15/13 0527  HGB 12.7    Recent Labs  04/15/13 0527  WBC 12.8*  RBC 4.08  HCT 36.8  PLT 326    Recent Labs  04/15/13 0527  NA 138  K 3.8  CL 100  CO2 24  BUN 11  CREATININE 0.60  GLUCOSE 157*  CALCIUM 9.5   No results found for this basename: LABPT, INR,  in the last 72 hours  Sensation intact distally Intact pulses distally Dorsiflexion/Plantar flexion intact Incision: dressing C/D/I Compartment soft  Assessment/Plan: Drain d/c'd Continue pathway--PT this afternoon Poss d/c home Sunday   [place] Sitzmann A. 04/15/2013, 9:31 AM

## 2013-04-16 LAB — CBC
MCH: 31.1 pg (ref 26.0–34.0)
MCHC: 34 g/dL (ref 30.0–36.0)
Platelets: 326 10*3/uL (ref 150–400)
RBC: 3.96 MIL/uL (ref 3.87–5.11)
WBC: 17.4 10*3/uL — ABNORMAL HIGH (ref 4.0–10.5)

## 2013-04-16 NOTE — Progress Notes (Signed)
Physical Therapy Treatment Patient Details Name: BLANCH STANG MRN: [id number] DOB: 1955-11-17 Today's Date: 04/16/2013 Time: 4098-1191 PT Time Calculation (min): 27 min  PT Assessment / Plan / Recommendation  History of Present Illness s/p elective Rt TKA    PT Comments   Pt is ambulating at supervision level for safety and is moving well. Pt able to recall proper stair amb technique. Pt will have 24/7 (A) at home. Reviewed HEP with pt thoroughly. Encouraged pt to have all throw rugs out of the house to reduce risk of falls, pt verbalized understanding. Pt is safe from mobility standpoint to D/C at this time.   Follow Up Recommendations  Home health PT;Supervision/Assistance - 24 hour     Does the patient have the potential to tolerate intense rehabilitation     Barriers to Discharge        Equipment Recommendations  None recommended by PT    Recommendations for Other Services OT consult  Frequency 7X/week   Progress towards PT Goals Progress towards PT goals: Progressing toward goals  Plan Current plan remains appropriate    Precautions / Restrictions Precautions Precautions: Knee Required Braces or Orthoses: Knee Immobilizer - Right Knee Immobilizer - Right: On when out of bed or walking Restrictions Weight Bearing Restrictions: Yes RLE Weight Bearing: Weight bearing as tolerated   Pertinent Vitals/Pain 4/10 "stiffness"; RN Notified.    Mobility  Bed Mobility Bed Mobility: Not assessed Details for Bed Mobility Assistance: pt in chair and returned to chair; educated pt on using bil UEs with long sheet to advance Rt LE if she has any difficulities being independent with bed mobility at home; pt verbalized understanding  Transfers Transfers: Sit to Stand;Stand to Sit Sit to Stand: 5: Supervision;From chair/3-in-1;From elevated surface;With armrests Stand to Sit: 5: Supervision;To chair/3-in-1;With armrests Details for Transfer Assistance: supervision for safety; min  cues to have RW in place prior to standing  Ambulation/Gait Ambulation/Gait Assistance: 5: Supervision Ambulation Distance (Feet): 300 Feet Assistive device: Rolling walker Ambulation/Gait Assistance Details: pt beginning to progress to step through gt with cues and demo; pt beginning to rely less on UEs; cues for sequencing and RW management  Gait Pattern: Step-to pattern;Decreased stance time - right;Decreased step length - left;Step-through pattern Gait velocity: decreased Stairs: No Stairs Assistance Details (indicate cue type and reason): reviewed technique with pt; pt able to verbalized independently proper technique for stair amb Wheelchair Mobility Wheelchair Mobility: No    Exercises Total Joint Exercises Ankle Circles/Pumps: AROM;Both;10 reps;Seated Quad Sets: AROM;Right;10 reps;Supine Short Arc Quad: AROM;Right;10 reps;Seated Heel Slides: AAROM;Right;10 reps;Supine;Other (comment) Hip ABduction/ADduction: AAROM;Right;Strengthening;10 reps;Supine   PT Diagnosis:    PT Problem List:   PT Treatment Interventions:     PT Goals (current goals can now be found in the care plan section) Acute Rehab PT Goals Patient Stated Goal: home today PT Goal Formulation: With patient Time For Goal Achievement: 04/21/13 Potential to Achieve Goals: Good  Visit Information  Last PT Received On: 04/16/13 Assistance Needed: +1 History of Present Illness: s/p elective Rt TKA     Subjective Data  Subjective: pt sitting in chair; agreeable to therapy; states "i dont think i need to do the steps again. i feel ok with those. Im hoping to leave after church is over" Patient Stated Goal: home today   Cognition  Cognition Arousal/Alertness: Awake/alert Behavior During Therapy: WFL for tasks assessed/performed Overall Cognitive Status: Within Functional Limits for tasks assessed    Balance  Balance Balance Assessed: No  End  of Session PT - End of Session Equipment Utilized During  Treatment: Gait belt;Right knee immobilizer Activity Tolerance: Patient tolerated treatment well Patient left: with call bell/phone within reach;with family/visitor present;in chair Nurse Communication: Mobility status;Patient requests pain meds   GP     Donell Sievert,  [phone] 04/16/2013, 10:46 AM

## 2013-04-16 NOTE — Progress Notes (Signed)
  Subjective: 10-24 = R TKA Foley d/c'd, sitting in chair, tol PO solids at bfast, doing well with PT Desires d/c  Objective: Vital signs in last 24 hours: Temp:  [97.8 F (36.6 C)-98.2 F (36.8 C)] 98.1 F (36.7 C) (10/26 0622) Pulse Rate:  [96-105] 96 (10/26 0622) Resp:  [16-18] 16 (10/26 0622) BP: (120-133)/(60-64) 133/64 mmHg (10/26 0622) SpO2:  [94 %] 94 % (10/26 0622)  Intake/Output from previous day: 10/25 0701 - 10/26 0700 In: 960 [P.O.:960] Out: 75 [Drains:75] Intake/Output this shift: Total I/O In: 360 [P.O.:360] Out: -    Recent Labs  04/15/13 0527 04/16/13 0600  HGB 12.7 12.3    Recent Labs  04/15/13 0527 04/16/13 0600  WBC 12.8* 17.4*  RBC 4.08 3.96  HCT 36.8 36.2  PLT 326 326    Recent Labs  04/15/13 0527  NA 138  K 3.8  CL 100  CO2 24  BUN 11  CREATININE 0.60  GLUCOSE 157*  CALCIUM 9.5   No results found for this basename: LABPT, INR,  in the last 72 hours  Sensation intact distally Intact pulses distally Dorsiflexion/Plantar flexion intact Incision: dressing C/D/I Compartment soft  Assessment/Plan: D/c home D/w patient risk for narcotic-related postop constipation and strategies to tx it.   [name] A. 04/16/2013, 9:47 AM

## 2013-04-16 NOTE — Care Management Note (Signed)
    Page 1 of 1   04/16/2013     1:51:31 PM   CARE MANAGEMENT NOTE 04/16/2013  Patient:  CHENAE, BRAGER   Account Number:  [id number]  Date Initiated:  04/14/2013  Documentation initiated by:  [facility]  Subjective/Objective Assessment:   admitted postop rt total knee arthroplasty     Action/Plan:   plan HHPT   Anticipated DC Date:  04/16/2013   Anticipated DC Plan:  HOME W HOME HEALTH SERVICES      DC Planning Services  CM consult      Choice offered to / List presented to:          [facility] arranged  HH-2 PT      [facility] agency  [facility]   Status of service:  Completed, signed off Medicare Important Message given?   (If response is "NO", the following Medicare IM given date fields will be blank) Date Medicare IM given:   Date Additional Medicare IM given:    Discharge Disposition:  HOME W HOME HEALTH SERVICES  Per UR Regulation:    If discussed at Long Length of Stay Meetings, dates discussed:    Comments:  04/16/13 13:00 HHPT referral made in MD office prior to surgery and this CM called [facility] to notify of discharge.  No other CM needs were communicated.  Freddy Jaksch, BSN, Caryl Ada [phone].  04/14/13 Set up with HHPT with [facility]  by MD office. CM will foillow for d/c needs. Jacquelynn Cree RN, BSN, CCM

## 2013-04-17 ENCOUNTER — Encounter (HOSPITAL_COMMUNITY): Payer: Self-pay | Admitting: Orthopedic Surgery

## 2013-04-17 NOTE — Discharge Summary (Signed)
Patient ID: NANDA BITTICK MRN: [id number] DOB/AGE: [birth year] 57 y.o.  Admit date: 04/14/2013 Discharge date:04/16/2013 Admission Diagnoses:  Principal Problem:   Osteoarthritis of right knee   Discharge Diagnoses:  Same  Past Medical History  Diagnosis Date  . Hypertension   . Sleep apnea     cpap   > 4 yrs  last sleep study  . Arthritis     Surgeries: Procedure(s): RIGHT TOTAL KNEE ARTHROPLASTY AND LEFT KNEE INJECTION  on 04/14/2013    Discharged Condition: Improved  Hospital Course: [name] is an 57 y.o. female who was admitted 04/14/2013 for operative treatment ofOsteoarthritis of right knee. Patient has severe unremitting pain that affects sleep, daily activities, and work/hobbies. After pre-op clearance the patient was taken to the operating room on 04/14/2013 and underwent  Procedure(s): RIGHT TOTAL KNEE ARTHROPLASTY AND LEFT KNEE INJECTION .    Patient was given perioperative antibiotics: Anti-infectives   Start     Dose/Rate Route Frequency Ordered Stop   04/14/13 1400  clindamycin (CLEOCIN) IVPB 600 mg     600 mg 100 mL/hr over 30 Minutes Intravenous Every 6 hours 04/14/13 1010 04/14/13 2026   04/14/13 0600  clindamycin (CLEOCIN) IVPB 900 mg     900 mg 100 mL/hr over 30 Minutes Intravenous On call to O.R. 04/13/13 1405 04/14/13 0740       Patient was given sequential compression devices, early ambulation, and chemoprophylaxis to prevent DVT.  Patient benefited maximally from hospital stay and there were no complications.    Recent vital signs: see chart   Recent laboratory studies:  Recent Labs  04/15/13 0527 04/16/13 0600  WBC 12.8* 17.4*  HGB 12.7 12.3  HCT 36.8 36.2  PLT 326 326  NA 138  --   K 3.8  --   CL 100  --   CO2 24  --   BUN 11  --   CREATININE 0.60  --   GLUCOSE 157*  --   CALCIUM 9.5  --      Discharge Medications:     Medication List         acetaminophen 500 MG tablet  Commonly known as:  TYLENOL  Take  500-1,000 mg by mouth every 6 (six) hours as needed for pain.     aspirin EC 325 MG tablet  Take 1 tablet (325 mg total) by mouth 2 (two) times daily after a meal. Take x 1 month post op to decrease risk of blood clots.     cetirizine 10 MG tablet  Commonly known as:  ZYRTEC  Take 10 mg by mouth every morning.     Fish Oil 1000 MG Caps  Take 1,000 mg by mouth every morning.     hydrochlorothiazide 25 MG tablet  Commonly known as:  HYDRODIURIL  Take 25 mg by mouth every morning.     lamoTRIgine 150 MG tablet  Commonly known as:  LAMICTAL  Take 150 mg by mouth at bedtime.     Magnesium 250 MG Tabs  Take 500 mg by mouth 2 (two) times daily.     methocarbamol 750 MG tablet  Commonly known as:  ROBAXIN-750  Take 1 tablet (750 mg total) by mouth 3 (three) times daily. Prn spasm.     MOVE FREE PO  Take 2 tablets by mouth daily.     multivitamin with minerals Tabs tablet  Take 0.5 tablets by mouth 2 (two) times daily.     oxyCODONE-acetaminophen 5-325 MG per tablet  Commonly known as:  PERCOCET/ROXICET  Take 1-2 tablets by mouth every 6 (six) hours as needed for pain.     QUEtiapine 100 MG tablet  Commonly known as:  SEROQUEL  Take 100 mg by mouth 3 (three) times daily.     rosuvastatin 40 MG tablet  Commonly known as:  CRESTOR  Take 20 mg by mouth 3 (three) times a week. On Monday, Wednesday, and friday     traMADol 50 MG tablet  Commonly known as:  ULTRAM  Take 50 mg by mouth every 6 (six) hours as needed for pain.     Vitamin D 2000 UNITS tablet  Take 8,000 Units by mouth daily with lunch.        Diagnostic Studies: Dg Chest 2 View  04/05/2013   *RADIOLOGY REPORT*  Clinical Data: Preoperative examination (right total knee replacement)  CHEST - 2 VIEW  Comparison: None.  Findings:  Normal cardiac silhouette and mediastinal contours.  Minimal left basilar linear heterogeneous opacities.  There is mild diffuse slightly nodular thickening of the pulmonary interstitium.   No focal airspace opacity.  No pleural effusion or pneumothorax.  No evidence of edema.  There is minimal (approximately 4 mm) retrolisthesis of likely L1 upon L2.  IMPRESSION: 1.  Mild bronchitic change without acute cardiopulmonary disease. 2.  Minimal left basilar atelectasis/scar.   Original Report Authenticated By: Tacey Ruiz, MD   Mm Digital Screening  03/29/2013   *RADIOLOGY REPORT*  Clinical Data: Screening.  DIGITAL SCREENING BILATERAL MAMMOGRAM WITH CAD DIGITAL BREAST TOMOSYNTHESIS  Digital breast tomosynthesis images are acquired in two projections.  These images are reviewed in combination with the digital mammogram, confirming the findings below.  Comparison:  Prior studies dating back to 09/28/2006  FINDINGS:  ACR Breast Density Category c:  The breast tissue is heterogeneously dense, which may obscure small masses.  There is no suspicious dominant mass, architectural distortion, or calcification to suggest malignancy.  Images were processed with CAD.  IMPRESSION: No mammographic evidence of malignancy.  A result letter of this screening mammogram will be mailed directly to the patient.  RECOMMENDATION: Screening mammogram in one year. (Code:SM-B-01Y)  BI-RADS CATEGORY 1:  Negative.   Original Report Authenticated By: Cain Saupe, M.D.    Disposition: 06-Home-Health Care Svc        Follow-up Information   Follow up with GRAVES,JOHN L, MD. Schedule an appointment as soon as possible for a visit in 2 weeks.   Specialty:  Orthopedic Surgery   Contact information:   [address] [place] [address] [phone]        Signed: Matthew Folks 04/17/2013, 4:05 PM

## 2013-05-26 ENCOUNTER — Other Ambulatory Visit: Payer: Self-pay | Admitting: Internal Medicine

## 2013-07-04 ENCOUNTER — Encounter: Payer: Self-pay | Admitting: *Deleted

## 2013-07-05 ENCOUNTER — Ambulatory Visit: Payer: Self-pay | Admitting: Emergency Medicine

## 2013-07-25 ENCOUNTER — Ambulatory Visit (INDEPENDENT_AMBULATORY_CARE_PROVIDER_SITE_OTHER): Payer: 59 | Admitting: Physician Assistant

## 2013-07-25 ENCOUNTER — Encounter: Payer: Self-pay | Admitting: Physician Assistant

## 2013-07-25 VITALS — BP 110/70 | HR 100 | Temp 100.6°F | Resp 16 | Wt 168.0 lb

## 2013-07-25 DIAGNOSIS — J209 Acute bronchitis, unspecified: Secondary | ICD-10-CM

## 2013-07-25 MED ORDER — PREDNISONE 20 MG PO TABS: ORAL_TABLET | ORAL | Status: DC

## 2013-07-25 MED ORDER — ALBUTEROL SULFATE HFA 108 (90 BASE) MCG/ACT IN AERS: 2.0000 | INHALATION_SPRAY | Freq: Four times a day (QID) | RESPIRATORY_TRACT | Status: DC | PRN

## 2013-07-25 MED ORDER — BENZONATATE 100 MG PO CAPS: 100.0000 mg | ORAL_CAPSULE | Freq: Four times a day (QID) | ORAL | Status: DC | PRN

## 2013-07-25 MED ORDER — AZITHROMYCIN 250 MG PO TABS: ORAL_TABLET | ORAL | Status: DC

## 2013-07-25 NOTE — Progress Notes (Signed)
   Subjective:    Patient ID: [name], female    DOB: 02-20-1956, 58 y.o.   MRN: [id number]  Sinus Problem This is a new problem. The current episode started yesterday. The problem has been gradually worsening since onset. The maximum temperature recorded prior to her arrival was 100 - 100.9 F. The fever has been present for less than 1 day. Associated symptoms include chills, congestion, coughing, shortness of breath and sinus pressure. Pertinent negatives include no diaphoresis, ear pain, headaches, hoarse voice, neck pain, sneezing, sore throat or swollen glands. Treatments tried: OTC cough, alk plus. The treatment provided no relief.    Review of Systems  Constitutional: Positive for fever, chills and fatigue. Negative for diaphoresis.  HENT: Positive for congestion and sinus pressure. Negative for ear pain, hoarse voice, sneezing, sore throat and trouble swallowing.   Respiratory: Positive for cough, chest tightness and shortness of breath.   Cardiovascular: Negative.   Gastrointestinal: Negative.   Genitourinary: Negative.   Musculoskeletal: Negative.  Negative for neck pain.  Neurological: Negative.  Negative for headaches.      Objective:   Physical Exam  Constitutional: She is oriented to person, place, and time. She appears well-developed and well-nourished.  HENT:  Head: Normocephalic and atraumatic.  Right Ear: External ear normal.  Left Ear: External ear normal.  Nose: Right sinus exhibits maxillary sinus tenderness. Left sinus exhibits maxillary sinus tenderness.  Mouth/Throat: Oropharynx is clear and moist.  Eyes: Conjunctivae are normal. Pupils are equal, round, and reactive to light.  Neck: Normal range of motion. Neck supple.  Cardiovascular: Normal rate and regular rhythm.   Pulmonary/Chest: Effort normal. No respiratory distress. She has wheezes (expiratory). She has no rales. She exhibits no tenderness.  Abdominal: Soft. Bowel sounds are normal.   Lymphadenopathy:    She has no cervical adenopathy.  Neurological: She is alert and oriented to person, place, and time.  Skin: Skin is warm and dry.      Assessment & Plan:  Acute bronchitis - Plan: azithromycin (ZITHROMAX) 250 MG tablet, albuterol (PROVENTIL HFA;VENTOLIN HFA) 108 (90 BASE) MCG/ACT inhaler, predniSONE (DELTASONE) 20 MG tablet, benzonatate (TESSALON PERLES) 100 MG capsule   Hold on to Zpak, treat symptoms, information given.

## 2013-07-25 NOTE — Patient Instructions (Signed)
Please take the prednisone to help decrease inflammation and therefore decrease symptoms. Take it it with food to avoid GI upset. It can cause increased energy but on the other hand it can make it hard to sleep at night so please take it in the morning.  It is not an antibiotic so you can stop it early if you are feeling better.  If you are diabetic it will increase your sugars.   The majority of colds are caused by viruses and do not require antibiotics. Please read the rest of this hand out to learn more about the common cold and what you can do to help yourself as well as help prevent the over use of antibiotics.   COMMON COLD SIGNS AND SYMPTOMS - The common cold usually causes nasal congestion, runny nose, and sneezing. A sore throat may be present on the first day but usually resolves quickly. If a cough occurs, it generally develops on about the fourth or fifth day of symptoms, typically when congestion and runny nose are resolving  COMMON COLD COMPLICATIONS - In most cases, colds do not cause serious illness or complications. Most colds last for three to seven days, although many people continue to have symptoms (coughing, sneezing, congestion) for up to two weeks.  One of the more common complications is sinusitis, which is usually caused by viruses and rarely (about 2 percent of the time) by bacteria. Having thick or yellow to green-colored nasal discharge does not mean that bacterial sinusitis has developed; discolored nasal discharge is a normal phase of the common cold.  Lower respiratory infections, such as pneumonia or bronchitis, may develop following a cold.  Infection of the middle ear, or otitis media, can accompany or follow a cold.  COMMON COLD TREATMENT - There is no specific treatment for the viruses that cause the common cold. Most treatments are aimed at relieving some of the symptoms of the cold, but do not shorten or cure the cold. Antibiotics are not useful for treating the  common cold; antibiotics are only used to treat illnesses caused by bacteria, not viruses. Unnecessary use of antibiotics for the treatment of the common cold can cause allergic reactions, diarrhea, or other gastrointestinal symptoms in some patients.  The symptoms of a cold will resolve over time, even without any treatment. People with underlying medical conditions and those who use other over-the-counter or prescription medications should speak with their healthcare provider or pharmacist to ensure that it is safe to use these treatments. The following are treatments that may reduce the symptoms caused by the common cold.  Nasal congestion - Decongestants are good for nasal congestion- if you feel very stuffy but no mucus is coming out, this is the medication that will help you the most.  Pseudoephedrine is a decongestant that can improve nasal congestion. Although a prescription is not required, drugstores in the [place] keep pseudoephedrine behind the counter, so it must be requested from a pharmacist. If you have a heart condition or high blood pressure please use Coricidin BPH instead.   Runny nose - Antihistamines such as diphenhydramine (Benadryl), certazine (Zyrtec) which are best taking at night because they can make you tired OR loratadine (Claritin),  fexafinadine (Allegra) help with a runny nose.   Nasal sprays such an oxymetazoline (Afrin and others) may also give temporary relief of nasal congestion. However, these sprays should never be used for more than two to three days; use for more than three days use can worsen congestion.    Nasocort is now over the counter and can help decrease a runny nose. Please stop the medication if you have blurry vision or nose bleeds.   Sore throat and headache - Sore throat and headache are best treated with a mild pain reliever such as acetaminophen (Tylenol) or a non-steroidal anti-inflammatory agent such as ibuprofen or naproxen (Motrin or Aleve).  These medications should be taken with food to prevent stomach problems. As well as gargling with warm water and salt.   Cough - Common cough medicine ingredients include guaifenesin and dextromethorphan; these are often combined with other medications in over-the-counter cold formulas. Often a cough is worse at night or first in the morning due to post nasal drip from you nose. You can try to sleep at an angle to decrease a cough.   Alternative treatments - Heated, humidified air can improve symptoms of nasal congestion and runny nose, and causes few to no side effects. A number of alternative products, including vitamin C, doubling up on your vitamin D and herbal products such as echinacea, may help. Certain products, such as nasal gels that contain zinc (eg, Zicam), have been associated with a permanent loss of smell.  Antibiotics - Antibiotics should not be used to treat an uncomplicated common cold. As noted above, colds are caused by viruses. Antibiotics treat bacterial, not viral infections. Some viruses that cause the common cold can also depress the immune system or cause swelling in the lining of the nose or airways; this can, in turn, lead to a bacterial infection. Often you need to give your body 7 days to fight off a common cold while treating the symptoms with the medications listed above. If after 7 days your symptoms are not improving, you are getting worse, you have shortness of breath, chest pain, a fever of over 103 you should seek medical help immediately.   PREVENTION IS THE BEST MEDICINE - Hand washing is an essential and highly effective way to prevent the spread of infection.  Alcohol-based hand rubs are a good alternative for disinfecting hands if a sink is not available.  Hands should be washed before preparing food and eating and after coughing, blowing the nose, or sneezing. While it is not always possible to limit contact with people who may be infected with a cold, touching  the eyes, nose, or mouth after direct contact should be avoided when possible. Sneezing/coughing into the sleeve of one's clothing (at the inner elbow) is another means of containing sprays of saliva and secretions and does not contaminate the hands.   How to Use an Inhaler Proper inhaler technique is very important. Good technique ensures that the medicine reaches the lungs. Poor technique results in depositing the medicine on the tongue and back of the throat rather than in the airways. If you do not use the inhaler with good technique, the medicine will not help you. STEPS TO FOLLOW IF USING AN INHALER WITHOUT AN EXTENSION TUBE 1. Remove the cap from the inhaler. 2. If you are using the inhaler for the first time, you will need to prime it. Shake the inhaler for 5 seconds and release four puffs into the air, away from your face. Ask your health care provider or pharmacist if you have questions about priming your inhaler. 3. Shake the inhaler for 5 seconds before each breath in (inhalation). 4. Position the inhaler so that the top of the canister faces up. 5. Put your index finger on the top of the medicine canister. Your  thumb supports the bottom of the inhaler. 6. Open your mouth. 7. Either place the inhaler between your teeth and place your lips tightly around the mouthpiece, or hold the inhaler 1 2 inches away from your open mouth. If you are unsure of which technique to use, ask your health care provider. 8. Breathe out (exhale) normally and as completely as possible. 9. Press the canister down with your index finger to release the medicine. 10. At the same time as the canister is pressed, inhale deeply and slowly until your lungs are completely filled. This should take 4 6 seconds. Keep your tongue down. 11. Hold the medicine in your lungs for 5 10 seconds (10 seconds is best). This helps the medicine get into the small airways of your lungs. 12. Breathe out slowly, through pursed lips.  Whistling is an example of pursed lips. 13. Wait at least 15 30 seconds between puffs. Continue with the above steps until you have taken the number of puffs your health care provider has ordered. Do not use the inhaler more than your health care provider tells you. 14. Replace the cap on the inhaler. 15. Follow the directions from your health care provider or the inhaler insert for cleaning the inhaler. HOW TO DETERMINE IF YOUR INHALER IS FULL OR NEARLY EMPTY You cannot know when an inhaler is empty by shaking it. A few inhalers are now being made with dose counters. Ask your health care provider for a prescription that has a dose counter if you feel you need that extra help. If your inhaler does not have a counter, ask your health care provider to help you determine the date you need to refill your inhaler. Write the refill date on a calendar or your inhaler canister. Refill your inhaler 7 10 days before it runs out. Be sure to keep an adequate supply of medicine. This includes making sure it is not expired, and that you have a spare inhaler.  SEEK MEDICAL CARE IF:   Your symptoms are only partially relieved with your inhaler.  You are having trouble using your inhaler.  You have some increase in phlegm. SEEK IMMEDIATE MEDICAL CARE IF:   You feel little or no relief with your inhalers. You are still wheezing and are feeling shortness of breath or tightness in your chest or both.  You have dizziness, headaches, or a fast heart rate.  You have chills, fever, or night sweats.  You have a noticeable increase in phlegm production, or there is blood in the phlegm.

## 2013-08-09 ENCOUNTER — Encounter: Payer: Self-pay | Admitting: Emergency Medicine

## 2013-08-09 ENCOUNTER — Ambulatory Visit (INDEPENDENT_AMBULATORY_CARE_PROVIDER_SITE_OTHER): Payer: 59 | Admitting: Emergency Medicine

## 2013-08-09 VITALS — BP 128/80 | HR 76 | Temp 98.4°F | Resp 18 | Ht 65.0 in | Wt 172.0 lb

## 2013-08-09 DIAGNOSIS — E559 Vitamin D deficiency, unspecified: Secondary | ICD-10-CM

## 2013-08-09 DIAGNOSIS — R7303 Prediabetes: Secondary | ICD-10-CM

## 2013-08-09 DIAGNOSIS — I10 Essential (primary) hypertension: Secondary | ICD-10-CM | POA: Insufficient documentation

## 2013-08-09 DIAGNOSIS — R7309 Other abnormal glucose: Secondary | ICD-10-CM

## 2013-08-09 DIAGNOSIS — E782 Mixed hyperlipidemia: Secondary | ICD-10-CM

## 2013-08-09 LAB — CBC WITH DIFFERENTIAL/PLATELET
Basophils Absolute: 0 10*3/uL (ref 0.0–0.1)
Basophils Relative: 0 % (ref 0–1)
Eosinophils Absolute: 0.1 10*3/uL (ref 0.0–0.7)
Eosinophils Relative: 2 % (ref 0–5)
HEMATOCRIT: 39.9 % (ref 36.0–46.0)
Hemoglobin: 13.3 g/dL (ref 12.0–15.0)
LYMPHS PCT: 38 % (ref 12–46)
Lymphs Abs: 2.4 10*3/uL (ref 0.7–4.0)
MCH: 28.9 pg (ref 26.0–34.0)
MCHC: 33.3 g/dL (ref 30.0–36.0)
MCV: 86.7 fL (ref 78.0–100.0)
MONO ABS: 0.4 10*3/uL (ref 0.1–1.0)
Monocytes Relative: 7 % (ref 3–12)
Neutro Abs: 3.3 10*3/uL (ref 1.7–7.7)
Neutrophils Relative %: 53 % (ref 43–77)
Platelets: 321 10*3/uL (ref 150–400)
RBC: 4.6 MIL/uL (ref 3.87–5.11)
RDW: 14.7 % (ref 11.5–15.5)
WBC: 6.3 10*3/uL (ref 4.0–10.5)

## 2013-08-09 NOTE — Progress Notes (Signed)
Subjective:    Patient ID: [name], female    DOB: 1956/01/30, 58 y.o.   MRN: [id number]  HPI Comments: 59 yo female presents for 3 month F/U for HTN, Cholesterol, Pre-Dm, D. Deficient. LAST LABS T 142 TG 131 L 69 A1C 5.8 INSULIN 24 D92 She notes BP has been good. BS has been good. She has been healthy except the last few weeks with some minor colds. She has been keeping busy.   She has had recent cold. She still has the dry cough but overall better. She does note PND.  Hyperlipidemia   Current Outpatient Prescriptions on File Prior to Visit  Medication Sig Dispense Refill  . acetaminophen (TYLENOL) 500 MG tablet Take 500-1,000 mg by mouth every 6 (six) hours as needed for pain.      Marland Kitchen albuterol (PROVENTIL HFA;VENTOLIN HFA) 108 (90 BASE) MCG/ACT inhaler Inhale 2 puffs into the lungs every 6 (six) hours as needed for wheezing or shortness of breath.  1 Inhaler  2  . aspirin 81 MG tablet Take 81 mg by mouth daily.      . benzonatate (TESSALON PERLES) 100 MG capsule Take 1 capsule (100 mg total) by mouth every 6 (six) hours as needed for cough.  60 capsule  1  . cetirizine (ZYRTEC) 10 MG tablet Take 10 mg by mouth every morning.      . Cholecalciferol (VITAMIN D) 2000 UNITS tablet Take 8,000 Units by mouth daily with lunch.      . CRESTOR 40 MG tablet TAKE 1 TABLET BY MOUTH DAILY  30 tablet  2  . Glucosamine-Chondroitin (MOVE FREE PO) Take 2 tablets by mouth daily.      . hydrochlorothiazide (HYDRODIURIL) 25 MG tablet Take 25 mg by mouth every morning.      . lamoTRIgine (LAMICTAL) 150 MG tablet Take 150 mg by mouth at bedtime.      . Magnesium 250 MG TABS Take 500 mg by mouth 2 (two) times daily.       . methocarbamol (ROBAXIN-750) 750 MG tablet Take 1 tablet (750 mg total) by mouth 3 (three) times daily. Prn spasm.  40 tablet  0  . Multiple Vitamin (MULTIVITAMIN WITH MINERALS) TABS tablet Take 0.5 tablets by mouth 2 (two) times daily.      . QUEtiapine (SEROQUEL) 100 MG tablet  Take 100 mg by mouth 3 (three) times daily.      . traMADol (ULTRAM) 50 MG tablet Take 50 mg by mouth every 6 (six) hours as needed for pain.       No current facility-administered medications on file prior to visit.   Allergies  Allergen Reactions  . Latex     Band aids= if left on for extended period of time  . Celecoxib     REACTION: Rash (celebrex)  . Cephalexin     REACTION: Rash (keflex)  . Codeine     REACTION: Rash  . Erythromycin     REACTION: Rash (emycin)  . Sulfonamide Derivatives     REACTION: Rash  . Trazodone And Nefazodone     insomnia  . 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 Hypertension   . Sleep apnea     cpap   > 4 yrs  last sleep study  . Arthritis   . Depression   . Fibromyalgia   . Vitamin D deficiency   . Elevated hemoglobin A1c   .  Unspecified essential hypertension 08/09/2013  . Other abnormal glucose 08/09/2013  . Unspecified vitamin D deficiency 08/09/2013       Review of Systems  HENT: Positive for congestion and postnasal drip.   Respiratory: Positive for cough.   All other systems reviewed and are negative.   BP 128/80  Pulse 76  Temp(Src) 98.4 F (36.9 C) (Temporal)  Resp 18  Ht 5\' 5"  (1.651 m)  Wt 172 lb (78.019 kg)  BMI 28.62 kg/m2     Objective:   Physical Exam  Nursing note and vitals reviewed. Constitutional: She is oriented to person, place, and time. She appears well-developed and well-nourished. No distress.  HENT:  Head: Normocephalic and atraumatic.  Right Ear: External ear normal.  Left Ear: External ear normal.  Nose: Nose normal.  Mouth/Throat: Oropharynx is clear and moist.  Cloudy TM's bilaterally   Eyes: Conjunctivae and EOM are normal.  Neck: Normal range of motion. Neck supple. No JVD present. No thyromegaly present.  Cardiovascular: Normal rate, regular rhythm, normal heart sounds and intact distal pulses.   Pulmonary/Chest: Effort normal and  breath sounds normal.  Abdominal: Soft. Bowel sounds are normal. She exhibits no distension and no mass. There is no tenderness. There is no rebound and no guarding.  Musculoskeletal: Normal range of motion. She exhibits no edema and no tenderness.  Lymphadenopathy:    She has no cervical adenopathy.  Neurological: She is alert and oriented to person, place, and time. No cranial nerve deficit.  Skin: Skin is warm and dry. No rash noted. No erythema. No pallor.  Psychiatric: She has a normal mood and affect. Her behavior is normal. Judgment and thought content normal.          Assessment & Plan:  1.  3 month F/U for HTN, Cholesterol, Pre-Dm, D. Deficient. Needs healthy diet, cardio QD and obtain healthy weight. Check Labs, Check BP if >130/80 call office 2. Allergic rhinitis- Allegra OTC, increase H2o, allergy hygiene explained.

## 2013-08-09 NOTE — Patient Instructions (Signed)
Fat and Cholesterol Control Diet  Fat and cholesterol levels in your blood and organs are influenced by your diet. High levels of fat and cholesterol may lead to diseases of the heart, small and large blood vessels, gallbladder, liver, and pancreas.  CONTROLLING FAT AND CHOLESTEROL WITH DIET  Although exercise and lifestyle factors are important, your diet is key. That is because certain foods are known to raise cholesterol and others to lower it. The goal is to balance foods for their effect on cholesterol and more importantly, to replace saturated and trans fat with other types of fat, such as monounsaturated fat, polyunsaturated fat, and omega-3 fatty acids.  On average, a person should consume no more than 15 to 17 g of saturated fat daily. Saturated and trans fats are considered "bad" fats, and they will raise LDL cholesterol. Saturated fats are primarily found in animal products such as meats, butter, and cream. However, that does not mean you need to give up all your favorite foods. Today, there are good tasting, low-fat, low-cholesterol substitutes for most of the things you like to eat. Choose low-fat or nonfat alternatives. Choose round or loin cuts of red meat. These types of cuts are lowest in fat and cholesterol. Chicken (without the skin), fish, veal, and ground turkey breast are great choices. Eliminate fatty meats, such as hot dogs and salami. Even shellfish have little or no saturated fat. Have a 3 oz (85 g) portion when you eat lean meat, poultry, or fish.  Trans fats are also called "partially hydrogenated oils." They are oils that have been scientifically manipulated so that they are solid at room temperature resulting in a longer shelf life and improved taste and texture of foods in which they are added. Trans fats are found in stick margarine, some tub margarines, cookies, crackers, and baked goods.   When baking and cooking, oils are a great substitute for butter. The monounsaturated oils are  especially beneficial since it is believed they lower LDL and raise HDL. The oils you should avoid entirely are saturated tropical oils, such as coconut and palm.   Remember to eat a lot from food groups that are naturally free of saturated and trans fat, including fish, fruit, vegetables, beans, grains (barley, rice, couscous, bulgur wheat), and pasta (without cream sauces).   IDENTIFYING FOODS THAT LOWER FAT AND CHOLESTEROL   Soluble fiber may lower your cholesterol. This type of fiber is found in fruits such as apples, vegetables such as broccoli, potatoes, and carrots, legumes such as beans, peas, and lentils, and grains such as barley. Foods fortified with plant sterols (phytosterol) may also lower cholesterol. You should eat at least 2 g per day of these foods for a cholesterol lowering effect.   Read package labels to identify low-saturated fats, trans fat free, and low-fat foods at the supermarket. Select cheeses that have only 2 to 3 g saturated fat per ounce. Use a heart-healthy tub margarine that is free of trans fats or partially hydrogenated oil. When buying baked goods (cookies, crackers), avoid partially hydrogenated oils. Breads and muffins should be made from whole grains (whole-wheat or whole oat flour, instead of "flour" or "enriched flour"). Buy non-creamy canned soups with reduced salt and no added fats.   FOOD PREPARATION TECHNIQUES   Never deep-fry. If you must fry, either stir-fry, which uses very little fat, or use non-stick cooking sprays. When possible, broil, bake, or roast meats, and steam vegetables. Instead of putting butter or margarine on vegetables, use lemon   and herbs, applesauce, and cinnamon (for squash and sweet potatoes). Use nonfat yogurt, salsa, and low-fat dressings for salads.   LOW-SATURATED FAT / LOW-FAT FOOD SUBSTITUTES  Meats / Saturated Fat (g)  · Avoid: Steak, marbled (3 oz/85 g) / 11 g  · Choose: Steak, lean (3 oz/85 g) / 4 g  · Avoid: Hamburger (3 oz/85 g) / 7  g  · Choose: Hamburger, lean (3 oz/85 g) / 5 g  · Avoid: Ham (3 oz/85 g) / 6 g  · Choose: Ham, lean cut (3 oz/85 g) / 2.4 g  · Avoid: Chicken, with skin, dark meat (3 oz/85 g) / 4 g  · Choose: Chicken, skin removed, dark meat (3 oz/85 g) / 2 g  · Avoid: Chicken, with skin, light meat (3 oz/85 g) / 2.5 g  · Choose: Chicken, skin removed, light meat (3 oz/85 g) / 1 g  Dairy / Saturated Fat (g)  · Avoid: Whole milk (1 cup) / 5 g  · Choose: Low-fat milk, 2% (1 cup) / 3 g  · Choose: Low-fat milk, 1% (1 cup) / 1.5 g  · Choose: Skim milk (1 cup) / 0.3 g  · Avoid: Hard cheese (1 oz/28 g) / 6 g  · Choose: Skim milk cheese (1 oz/28 g) / 2 to 3 g  · Avoid: Cottage cheese, 4% fat (1 cup) / 6.5 g  · Choose: Low-fat cottage cheese, 1% fat (1 cup) / 1.5 g  · Avoid: Ice cream (1 cup) / 9 g  · Choose: Sherbet (1 cup) / 2.5 g  · Choose: Nonfat frozen yogurt (1 cup) / 0.3 g  · Choose: Frozen fruit bar / trace  · Avoid: Whipped cream (1 tbs) / 3.5 g  · Choose: Nondairy whipped topping (1 tbs) / 1 g  Condiments / Saturated Fat (g)  · Avoid: Mayonnaise (1 tbs) / 2 g  · Choose: Low-fat mayonnaise (1 tbs) / 1 g  · Avoid: Butter (1 tbs) / 7 g  · Choose: Extra light margarine (1 tbs) / 1 g  · Avoid: Coconut oil (1 tbs) / 11.8 g  · Choose: Olive oil (1 tbs) / 1.8 g  · Choose: Corn oil (1 tbs) / 1.7 g  · Choose: Safflower oil (1 tbs) / 1.2 g  · Choose: Sunflower oil (1 tbs) / 1.4 g  · Choose: Soybean oil (1 tbs) / 2.4 g  · Choose: Canola oil (1 tbs) / 1 g  Document Released: 06/08/2005 Document Revised: 10/03/2012 Document Reviewed: 11/27/2010  [employer]® Patient Information ©2014 [employer].

## 2013-08-10 LAB — HEPATIC FUNCTION PANEL
ALBUMIN: 4.5 g/dL (ref 3.5–5.2)
ALT: 23 U/L (ref 0–35)
AST: 16 U/L (ref 0–37)
Alkaline Phosphatase: 84 U/L (ref 39–117)
Bilirubin, Direct: 0.1 mg/dL (ref 0.0–0.3)
Indirect Bilirubin: 0.5 mg/dL (ref 0.2–1.2)
TOTAL PROTEIN: 7 g/dL (ref 6.0–8.3)
Total Bilirubin: 0.6 mg/dL (ref 0.2–1.2)

## 2013-08-10 LAB — BASIC METABOLIC PANEL WITH GFR
BUN: 14 mg/dL (ref 6–23)
CHLORIDE: 104 meq/L (ref 96–112)
CO2: 32 meq/L (ref 19–32)
Calcium: 9.8 mg/dL (ref 8.4–10.5)
Creat: 0.65 mg/dL (ref 0.50–1.10)
GFR, Est African American: >89 mL/min
GFR, Est Non African American: >89 mL/min
Glucose, Bld: 84 mg/dL (ref 70–99)
POTASSIUM: 4.2 meq/L (ref 3.5–5.3)
Sodium: 141 mEq/L (ref 135–145)

## 2013-08-10 LAB — LIPID PANEL
CHOLESTEROL: 189 mg/dL (ref 0–200)
HDL: 51 mg/dL (ref >39)
LDL Cholesterol: 107 mg/dL — ABNORMAL HIGH (ref 0–99)
TRIGLYCERIDES: 157 mg/dL — AB (ref <150)
Total CHOL/HDL Ratio: 3.7 Ratio
VLDL: 31 mg/dL (ref 0–40)

## 2013-08-10 LAB — VITAMIN D 25 HYDROXY (VIT D DEFICIENCY, FRACTURES): Vit D, 25-Hydroxy: 98 ng/mL — ABNORMAL HIGH (ref 30–89)

## 2013-08-10 LAB — MAGNESIUM: MAGNESIUM: 2 mg/dL (ref 1.5–2.5)

## 2013-08-10 LAB — HEMOGLOBIN A1C
Hgb A1c MFr Bld: 5.8 % — ABNORMAL HIGH (ref <5.7)
Mean Plasma Glucose: 120 mg/dL — ABNORMAL HIGH (ref <117)

## 2013-08-10 LAB — INSULIN, FASTING: Insulin fasting, serum: 27 u[IU]/mL (ref 3–28)

## 2013-10-11 ENCOUNTER — Other Ambulatory Visit: Payer: Self-pay | Admitting: Internal Medicine

## 2013-11-15 ENCOUNTER — Ambulatory Visit: Payer: Self-pay | Admitting: Emergency Medicine

## 2013-12-06 ENCOUNTER — Encounter: Payer: Self-pay | Admitting: Emergency Medicine

## 2013-12-06 ENCOUNTER — Ambulatory Visit (INDEPENDENT_AMBULATORY_CARE_PROVIDER_SITE_OTHER): Payer: 59 | Admitting: Emergency Medicine

## 2013-12-06 VITALS — BP 106/74 | HR 78 | Temp 98.6°F | Resp 18 | Ht 65.0 in | Wt 183.0 lb

## 2013-12-06 DIAGNOSIS — M25579 Pain in unspecified ankle and joints of unspecified foot: Secondary | ICD-10-CM

## 2013-12-06 DIAGNOSIS — I10 Essential (primary) hypertension: Secondary | ICD-10-CM

## 2013-12-06 DIAGNOSIS — E782 Mixed hyperlipidemia: Secondary | ICD-10-CM

## 2013-12-06 DIAGNOSIS — R7309 Other abnormal glucose: Secondary | ICD-10-CM

## 2013-12-06 LAB — CBC WITH DIFFERENTIAL/PLATELET
BASOS ABS: 0 10*3/uL (ref 0.0–0.1)
Basophils Relative: 0 % (ref 0–1)
EOS ABS: 0.1 10*3/uL (ref 0.0–0.7)
Eosinophils Relative: 1 % (ref 0–5)
HCT: 38.4 % (ref 36.0–46.0)
Hemoglobin: 13 g/dL (ref 12.0–15.0)
Lymphocytes Relative: 43 % (ref 12–46)
Lymphs Abs: 2.6 10*3/uL (ref 0.7–4.0)
MCH: 29.9 pg (ref 26.0–34.0)
MCHC: 33.9 g/dL (ref 30.0–36.0)
MCV: 88.3 fL (ref 78.0–100.0)
Monocytes Absolute: 0.5 10*3/uL (ref 0.1–1.0)
Monocytes Relative: 9 % (ref 3–12)
Neutro Abs: 2.8 10*3/uL (ref 1.7–7.7)
Neutrophils Relative %: 47 % (ref 43–77)
PLATELETS: 320 10*3/uL (ref 150–400)
RBC: 4.35 MIL/uL (ref 3.87–5.11)
RDW: 14 % (ref 11.5–15.5)
WBC: 6 10*3/uL (ref 4.0–10.5)

## 2013-12-06 NOTE — Patient Instructions (Signed)
FYI Gout Gout is when your joints become red, sore, and swell (inflammed). This is caused by the buildup of uric acid crystals in the joints. Uric acid is a chemical that is normally in the blood. If the level of uric acid gets too high in the blood, these crystals form in your joints and tissues. Over time, these crystals can form into masses near the joints and tissues. These masses can destroy bone and cause the bone to look misshapen (deformed). HOME CARE   Do not take aspirin for pain.  Only take medicine as told by your doctor.  Rest the joint as much as you can. When in bed, keep sheets and blankets off painful areas.  Keep the sore joints raised (elevated).  Put warm or cold packs on painful joints. Use of warm or cold packs depends on which works best for you.  Use crutches if the painful joint is in your leg.  Drink enough fluids to keep your pee (urine) clear or pale yellow. Limit alcohol, sugary drinks, and drinks with fructose in them.  Follow your diet instructions. Pay careful attention to how much protein you eat. Include fruits, vegetables, whole grains, and fat-free or low-fat milk products in your daily diet. Talk to your doctor or dietician about the use of coffee, vitamin C, and cherries. These may help lower uric acid levels.  Keep a healthy body weight. GET HELP RIGHT AWAY IF:   You have watery poop (diarrhea), throw up (vomit), or have any side effects from medicines.  You do not feel better in 24 hours, or you are getting worse.  Your joint becomes suddenly more tender, and you have chills or a fever. MAKE SURE YOU:   Understand these instructions.  Will watch your condition.  Will get help right away if you are not doing well or get worse. Document Released: 03/17/2008 Document Revised: 10/03/2012 Document Reviewed: 09/16/2009 [facility] Patient Information 2015 [place], [place]. This information is not intended to replace advice given to you by your health  care provider. Make sure you discuss any questions you have with your health care provider.

## 2013-12-06 NOTE — Progress Notes (Signed)
Subjective:    Patient ID: [name], female    DOB: 1956/04/20, 58 y.o.   MRN: [id number]  HPI Comments: 58 yo WF presents for 3 month F/U for HTN, Cholesterol, Pre-Dm, D. Deficient. She notes BP good and denies hypotension. She has not been as good lately. She is not exercising as much due to left foot. She has had two shots in foot over last several months by ortho and has f/u pending. She notes mild improvement with 2nd shot..  WBC             6.3   08/09/2013 HGB            13.3   08/09/2013 HCT            39.9   08/09/2013 PLT             321   08/09/2013 GLUCOSE          84   08/09/2013 CHOL            189   08/09/2013 TRIG            157   08/09/2013 HDL              51   08/09/2013 LDLCALC         107   08/09/2013 ALT              23   08/09/2013 AST              16   08/09/2013 NA              141   08/09/2013 K               4.2   08/09/2013 CL              104   08/09/2013 CREATININE     0.65   08/09/2013 BUN              14   08/09/2013 CO2              32   08/09/2013 INR            1.05   04/05/2013 HGBA1C          5.8   08/09/2013    Hyperlipidemia Associated symptoms include myalgias.      Medication List       This list is accurate as of: 12/06/13 11:47 AM.  Always use your most recent med list.               acetaminophen 500 MG tablet  Commonly known as:  TYLENOL  Take 500-1,000 mg by mouth every 6 (six) hours as needed for pain.     albuterol 108 (90 BASE) MCG/ACT inhaler  Commonly known as:  PROVENTIL HFA;VENTOLIN HFA  Inhale 2 puffs into the lungs every 6 (six) hours as needed for wheezing or shortness of breath.     aspirin 81 MG tablet  Take 81 mg by mouth daily.     CRESTOR 40 MG tablet  Generic drug:  rosuvastatin  TAKE 1 TABLET BY MOUTH DAILY     fexofenadine 60 MG tablet  Commonly known as:  ALLEGRA  Take 60 mg by mouth daily.     Fish Oil 1000 MG Caps  Take by mouth daily.     hydrochlorothiazide 25 MG tablet  Commonly known as:   HYDRODIURIL  TAKE 1 TABLET BY  MOUTH EVERY MORNING FOR BLOOD PRESSURE AND FLUID     lamoTRIgine 150 MG tablet  Commonly known as:  LAMICTAL  Take 150 mg by mouth at bedtime.     Magnesium 250 MG Tabs  Take 250 mg by mouth. Takes 5 pills per day     MOVE FREE PO  Take 2 tablets by mouth daily.     multivitamin with minerals Tabs tablet  Take 0.5 tablets by mouth 2 (two) times daily.     QUEtiapine 100 MG tablet  Commonly known as:  SEROQUEL  Take 100 mg by mouth 3 (three) times daily.     traMADol 50 MG tablet  Commonly known as:  ULTRAM  Take 50 mg by mouth every 6 (six) hours as needed for pain.     Vitamin D 2000 UNITS tablet  Take 8,000 Units by mouth. Takes 8,000 units M,W,F only       Allergies  Allergen Reactions  . Latex     Band aids= if left on for extended period of time  . Celecoxib     REACTION: Rash (celebrex)  . Cephalexin     REACTION: Rash (keflex)  . Codeine     REACTION: Rash  . Erythromycin     REACTION: Rash (emycin)  . Sulfonamide Derivatives     REACTION: Rash  . Trazodone And Nefazodone     insomnia  . 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 Hypertension   . Sleep apnea     cpap   > 4 yrs  last sleep study  . Arthritis   . Depression   . Fibromyalgia   . Vitamin D deficiency   . Elevated hemoglobin A1c   . Unspecified essential hypertension 08/09/2013  . Other abnormal glucose 08/09/2013  . Unspecified vitamin D deficiency 08/09/2013     Review of Systems  Musculoskeletal: Positive for myalgias.  All other systems reviewed and are negative.  BP 106/74  Pulse 78  Temp(Src) 98.6 F (37 C) (Temporal)  Resp 18  Ht 5\' 5"  (1.651 m)  Wt 183 lb (83.008 kg)  BMI 30.45 kg/m2     Objective:   Physical Exam  Nursing note and vitals reviewed. Constitutional: She is oriented to person, place, and time. She appears well-developed and well-nourished. No distress.  HENT:  Head:  Normocephalic and atraumatic.  Right Ear: External ear normal.  Left Ear: External ear normal.  Nose: Nose normal.  Mouth/Throat: Oropharynx is clear and moist.  Eyes: Conjunctivae and EOM are normal.  Neck: Normal range of motion. Neck supple. No JVD present. No thyromegaly present.  Cardiovascular: Normal rate, regular rhythm, normal heart sounds and intact distal pulses.   Pulmonary/Chest: Effort normal and breath sounds normal.  Abdominal: Soft. Bowel sounds are normal. She exhibits no distension and no mass. There is no tenderness. There is no rebound and no guarding.  Musculoskeletal: Normal range of motion. She exhibits no edema and no tenderness.  Left foot minimal limp with initiating gait  Lymphadenopathy:    She has no cervical adenopathy.  Neurological: She is alert and oriented to person, place, and time. No cranial nerve deficit.  Skin: Skin is warm and dry. No rash noted. No erythema. No pallor.  Psychiatric: She has a normal mood and affect. Her behavior is normal. Judgment and thought content normal.          Assessment & Plan:  1.  3 month F/U for HTN, Cholesterol, Pre-Dm, D. Deficient. Needs healthy diet, cardio QD and obtain healthy weight. Check Labs, Check BP if >130/80 call office   2. Left foot pain- F/u AD for ortho, Ice massage and stretches QD, advise water exercise

## 2013-12-07 LAB — BASIC METABOLIC PANEL WITH GFR
BUN: 19 mg/dL (ref 6–23)
CALCIUM: 9.5 mg/dL (ref 8.4–10.5)
CHLORIDE: 101 meq/L (ref 96–112)
CO2: 26 meq/L (ref 19–32)
Creat: 0.74 mg/dL (ref 0.50–1.10)
GFR, Est African American: >89 mL/min
GFR, Est Non African American: >89 mL/min
Glucose, Bld: 75 mg/dL (ref 70–99)
Potassium: 4.2 mEq/L (ref 3.5–5.3)
SODIUM: 140 meq/L (ref 135–145)

## 2013-12-07 LAB — HEPATIC FUNCTION PANEL
ALK PHOS: 85 U/L (ref 39–117)
ALT: 24 U/L (ref 0–35)
AST: 19 U/L (ref 0–37)
Albumin: 4.3 g/dL (ref 3.5–5.2)
Bilirubin, Direct: 0.1 mg/dL (ref 0.0–0.3)
Indirect Bilirubin: 0.5 mg/dL (ref 0.2–1.2)
TOTAL PROTEIN: 6.8 g/dL (ref 6.0–8.3)
Total Bilirubin: 0.6 mg/dL (ref 0.2–1.2)

## 2013-12-07 LAB — LIPID PANEL
Cholesterol: 164 mg/dL (ref 0–200)
HDL: 56 mg/dL (ref >39)
LDL CALC: 73 mg/dL (ref 0–99)
Total CHOL/HDL Ratio: 2.9 Ratio
Triglycerides: 174 mg/dL — ABNORMAL HIGH (ref <150)
VLDL: 35 mg/dL (ref 0–40)

## 2013-12-07 LAB — URIC ACID: Uric Acid, Serum: 5.7 mg/dL (ref 2.4–7.0)

## 2013-12-07 LAB — HEMOGLOBIN A1C
HEMOGLOBIN A1C: 5.8 % — AB (ref <5.7)
Mean Plasma Glucose: 120 mg/dL — ABNORMAL HIGH (ref <117)

## 2013-12-07 LAB — INSULIN, FASTING: INSULIN FASTING, SERUM: 36 u[IU]/mL — AB (ref 3–28)

## 2014-01-31 ENCOUNTER — Encounter: Payer: Self-pay | Admitting: Internal Medicine

## 2014-01-31 ENCOUNTER — Ambulatory Visit (INDEPENDENT_AMBULATORY_CARE_PROVIDER_SITE_OTHER): Payer: Medicare Other | Admitting: Internal Medicine

## 2014-01-31 VITALS — BP 116/76 | HR 72 | Temp 98.4°F | Resp 18 | Ht 65.0 in | Wt 185.4 lb

## 2014-01-31 DIAGNOSIS — R7309 Other abnormal glucose: Secondary | ICD-10-CM

## 2014-01-31 DIAGNOSIS — G8929 Other chronic pain: Secondary | ICD-10-CM | POA: Insufficient documentation

## 2014-01-31 DIAGNOSIS — I10 Essential (primary) hypertension: Secondary | ICD-10-CM

## 2014-01-31 DIAGNOSIS — R7401 Elevation of levels of liver transaminase levels: Secondary | ICD-10-CM

## 2014-01-31 DIAGNOSIS — E559 Vitamin D deficiency, unspecified: Secondary | ICD-10-CM

## 2014-01-31 DIAGNOSIS — Z789 Other specified health status: Secondary | ICD-10-CM

## 2014-01-31 DIAGNOSIS — M797 Fibromyalgia: Secondary | ICD-10-CM

## 2014-01-31 DIAGNOSIS — E669 Obesity, unspecified: Secondary | ICD-10-CM

## 2014-01-31 DIAGNOSIS — Z1212 Encounter for screening for malignant neoplasm of rectum: Secondary | ICD-10-CM

## 2014-01-31 DIAGNOSIS — Z0000 Encounter for general adult medical examination without abnormal findings: Secondary | ICD-10-CM

## 2014-01-31 DIAGNOSIS — E782 Mixed hyperlipidemia: Secondary | ICD-10-CM

## 2014-01-31 DIAGNOSIS — F329 Major depressive disorder, single episode, unspecified: Secondary | ICD-10-CM

## 2014-01-31 DIAGNOSIS — R740 Nonspecific elevation of levels of transaminase and lactic acid dehydrogenase [LDH]: Secondary | ICD-10-CM

## 2014-01-31 DIAGNOSIS — F3341 Major depressive disorder, recurrent, in partial remission: Secondary | ICD-10-CM | POA: Insufficient documentation

## 2014-01-31 DIAGNOSIS — Z113 Encounter for screening for infections with a predominantly sexual mode of transmission: Secondary | ICD-10-CM

## 2014-01-31 DIAGNOSIS — Z1331 Encounter for screening for depression: Secondary | ICD-10-CM

## 2014-01-31 DIAGNOSIS — R7402 Elevation of levels of lactic acid dehydrogenase (LDH): Secondary | ICD-10-CM

## 2014-01-31 DIAGNOSIS — Z79899 Other long term (current) drug therapy: Secondary | ICD-10-CM

## 2014-01-31 DIAGNOSIS — F32A Depression, unspecified: Secondary | ICD-10-CM

## 2014-01-31 LAB — HEPATITIS C ANTIBODY: HCV Ab: NEGATIVE

## 2014-01-31 LAB — URINALYSIS, MICROSCOPIC ONLY
Casts: NONE SEEN
Crystals: NONE SEEN

## 2014-01-31 LAB — BASIC METABOLIC PANEL WITH GFR
BUN: 20 mg/dL (ref 6–23)
CALCIUM: 10.1 mg/dL (ref 8.4–10.5)
CHLORIDE: 102 meq/L (ref 96–112)
CO2: 29 meq/L (ref 19–32)
Creat: 0.78 mg/dL (ref 0.50–1.10)
GFR, Est African American: >89 mL/min
GFR, Est Non African American: 85 mL/min
Glucose, Bld: 98 mg/dL (ref 70–99)
Potassium: 3.9 mEq/L (ref 3.5–5.3)
Sodium: 140 mEq/L (ref 135–145)

## 2014-01-31 LAB — CBC WITH DIFFERENTIAL/PLATELET
BASOS ABS: 0 10*3/uL (ref 0.0–0.1)
Basophils Relative: 0 % (ref 0–1)
Eosinophils Absolute: 0.3 10*3/uL (ref 0.0–0.7)
Eosinophils Relative: 6 % — ABNORMAL HIGH (ref 0–5)
HCT: 41.1 % (ref 36.0–46.0)
Hemoglobin: 13.7 g/dL (ref 12.0–15.0)
LYMPHS ABS: 2.1 10*3/uL (ref 0.7–4.0)
LYMPHS PCT: 41 % (ref 12–46)
MCH: 29.3 pg (ref 26.0–34.0)
MCHC: 33.3 g/dL (ref 30.0–36.0)
MCV: 88 fL (ref 78.0–100.0)
Monocytes Absolute: 0.5 10*3/uL (ref 0.1–1.0)
Monocytes Relative: 9 % (ref 3–12)
NEUTROS PCT: 44 % (ref 43–77)
Neutro Abs: 2.3 10*3/uL (ref 1.7–7.7)
Platelets: 336 10*3/uL (ref 150–400)
RBC: 4.67 MIL/uL (ref 3.87–5.11)
RDW: 14.1 % (ref 11.5–15.5)
WBC: 5.2 10*3/uL (ref 4.0–10.5)

## 2014-01-31 LAB — HEPATITIS A ANTIBODY, TOTAL: Hep A Total Ab: NONREACTIVE

## 2014-01-31 LAB — MAGNESIUM: MAGNESIUM: 2 mg/dL (ref 1.5–2.5)

## 2014-01-31 LAB — HEPATIC FUNCTION PANEL
ALBUMIN: 4.6 g/dL (ref 3.5–5.2)
ALK PHOS: 84 U/L (ref 39–117)
ALT: 35 U/L (ref 0–35)
AST: 27 U/L (ref 0–37)
BILIRUBIN DIRECT: 0.1 mg/dL (ref 0.0–0.3)
BILIRUBIN TOTAL: 0.6 mg/dL (ref 0.2–1.2)
Indirect Bilirubin: 0.5 mg/dL (ref 0.2–1.2)
Total Protein: 7.5 g/dL (ref 6.0–8.3)

## 2014-01-31 LAB — RPR

## 2014-01-31 LAB — HEMOGLOBIN A1C
Hgb A1c MFr Bld: 5.7 % — ABNORMAL HIGH (ref <5.7)
Mean Plasma Glucose: 117 mg/dL — ABNORMAL HIGH (ref <117)

## 2014-01-31 LAB — HIV ANTIBODY (ROUTINE TESTING W REFLEX): HIV: NONREACTIVE

## 2014-01-31 LAB — TSH: TSH: 1.676 u[IU]/mL (ref 0.350–4.500)

## 2014-01-31 LAB — LIPID PANEL
Cholesterol: 168 mg/dL (ref 0–200)
HDL: 56 mg/dL (ref >39)
LDL Cholesterol: 88 mg/dL (ref 0–99)
Total CHOL/HDL Ratio: 3 Ratio
Triglycerides: 121 mg/dL (ref <150)
VLDL: 24 mg/dL (ref 0–40)

## 2014-01-31 LAB — HEPATITIS B SURFACE ANTIBODY,QUALITATIVE: HEP B S AB: NEGATIVE

## 2014-01-31 LAB — HEPATITIS B CORE ANTIBODY, TOTAL: Hep B Core Total Ab: NONREACTIVE

## 2014-01-31 MED ORDER — ALPRAZOLAM 1 MG PO TABS
ORAL_TABLET | ORAL | Status: DC
Start: 2014-01-31 — End: 2014-05-16

## 2014-01-31 NOTE — Progress Notes (Signed)
Patient ID: [name], female   DOB: Jul 14, 1955, 58 y.o.   MRN: [id number]   Annual Screening Comprehensive Examination  This very nice 58 y.o.MWF presents for complete physical.  Patient has been followed for HTN, Diabetes  Prediabetes, Hyperlipidemia, and Vitamin D Deficiency. Patient has Hx/o depression and has been on SS Disability since 2007 due to panic Attacks, but currently has done well w/o out recent exacerbations of depression or panic attacks. She doe relate poor sleep hygiene with difficulty falling asleep.    HTN predates since 2004. Patient's BP has been controlled at home and patient denies any cardiac symptoms as chest pain, palpitations, shortness of breath, dizziness or ankle swelling. Today's BP: 116/76 mmHg.    Patient's hyperlipidemia is controlled with diet and medications. Patient denies myalgias or other medication SE's. Last lipids were  Cholesterol  164; HDL  56; LDL  73; Triglycerides 174 on 12/06/2013. Marland Kitchen  Patient has prediabetes predating since  2012 with A1c 6.3% and is attempting dietary control. Patient denies reactive hypoglycemic symptoms, visual blurring, diabetic polys, or paresthesias. Last A1c was  5.8% on 12/06/2013.   Finally, patient has history of Vitamin D Deficiency and last Vitamin D was  98 on 08/09/2013.  Medication Sig  . acetaminophen (TYLENOL) 500 MG tablet Take 500-1,000 mg by mouth every 6 (six) hours as needed for pain.  Marland Kitchen albuterol (PROVENTIL HFA;VENTOLIN HFA) 108 (90 BASE) MCG/ACT inhaler Inhale 2 puffs into the lungs every 6 (six) hours as needed for wheezing or shortness of breath.  Marland Kitchen aspirin 81 MG tablet Take 81 mg by mouth daily.  . Cholecalciferol (VITAMIN D) 2000 UNITS tablet Take 8,000 Units by mouth. Takes 8,000 units M,W,F only  . CRESTOR 40 MG tablet TAKE 1 TABLET BY MOUTH DAILY  . fexofenadine (ALLEGRA) 60 MG tablet Take 60 mg by mouth daily.  . Glucosamine-Chondroitin (MOVE FREE PO) Take 2 tablets by mouth daily.  .  hydrochlorothiazide (HYDRODIURIL) 25 MG tablet TAKE 1 TABLET BY MOUTH EVERY MORNING FOR BLOOD PRESSURE AND FLUID  . lamoTRIgine (LAMICTAL) 150 MG tablet Take 150 mg by mouth at bedtime.  . Magnesium 250 MG TABS Take 250 mg by mouth. Takes 5 pills per day  . Multiple Vitamin (MULTIVITAMIN WITH MINERALS) TABS tablet Take 0.5 tablets by mouth 2 (two) times daily.  . Omega-3 Fatty Acids (FISH OIL) 1000 MG CAPS Take by mouth daily.  . QUEtiapine (SEROQUEL) 100 MG tablet Take 100 mg by mouth 3 (three) times daily.  . traMADol (ULTRAM) 50 MG tablet Take 50 mg by mouth every 6 (six) hours as needed for pain.   Allergies  Allergen Reactions  . Latex     Band aids= if left on for extended period of time  . Celecoxib     REACTION: Rash (celebrex)  . Cephalexin     REACTION: Rash (keflex)  . Codeine     REACTION: Rash  . Erythromycin     REACTION: Rash (emycin)  . Sulfonamide Derivatives     REACTION: Rash  . Trazodone And Nefazodone     insomnia  . 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 Hypertension   . Sleep apnea     cpap   > 4 yrs  last sleep study  . Arthritis   . Depression   . Fibromyalgia   . Vitamin D deficiency   . Elevated hemoglobin A1c   . Unspecified essential hypertension  08/09/2013  . Other abnormal glucose 08/09/2013  . Unspecified vitamin D deficiency 08/09/2013   Past Surgical History  Procedure Laterality Date  . Abdominal hysterectomy    . Joint replacement      arthroscopy  . Total knee arthroplasty Right 04/14/2013    Procedure: RIGHT TOTAL KNEE ARTHROPLASTY AND LEFT KNEE INJECTION ;  Surgeon: Alta Corning, MD;  Location: [facility];  Service: Orthopedics;  Laterality: Right;   Family History  Problem Relation Age of Onset  . Hypertension Mother   . Hypertension Father   . COPD Father   . Cancer Father     thyroid  . CVA Maternal Grandmother    History  Substance Use Topics  . Smoking status: Never  Smoker   . Smokeless tobacco: Not on file  . Alcohol Use: No    ROS Constitutional: Denies fever, chills, weight loss/gain, headaches, insomnia, fatigue, night sweats, and change in appetite. Eyes: Denies redness, blurred vision, diplopia, discharge, itchy, watery eyes.  ENT: Denies discharge, congestion, post nasal drip, epistaxis, sore throat, earache, hearing loss, dental pain, Tinnitus, Vertigo, Sinus pain, snoring.  Cardio: Denies chest pain, palpitations, irregular heartbeat, syncope, dyspnea, diaphoresis, orthopnea, PND, claudication, edema Respiratory: denies cough, dyspnea, DOE, pleurisy, hoarseness, laryngitis, wheezing.  Gastrointestinal: Denies dysphagia, heartburn, reflux, water brash, pain, cramps, nausea, vomiting, bloating, diarrhea, constipation, hematemesis, melena, hematochezia, jaundice, hemorrhoids Genitourinary: Denies dysuria, frequency, urgency, nocturia, hesitancy, discharge, hematuria, flank pain Breast: Breast lumps, nipple discharge, bleeding.  Musculoskeletal: Denies arthralgia, myalgia, stiffness, Jt. Swelling, pain, limp, and strain/sprain. Denies falls. Skin: Denies puritis, rash, hives, warts, acne, eczema, changing in skin lesion Neuro: No weakness, tremor, incoordination, spasms, paresthesia, pain Psychiatric: Denies confusion, memory loss, sensory loss. Denies Recent Depression. Endocrine: Denies change in weight, skin, hair change, nocturia, and paresthesia, diabetic polys, visual blurring, hyper / hypo glycemic episodes.  Heme/Lymph: No excessive bleeding, bruising, enlarged lymph nodes.  Physical Exam  BP 116/76  Pulse 72  Temp(Src) 98.4 F (36.9 C) (Temporal)  Resp 18  Ht 5\' 5"  (1.651 m)  Wt 185 lb 6.4 oz (84.097 kg)  BMI 30.85 kg/m2  General Appearance: Well nourished and in no apparent distress. Eyes: PERRLA, EOMs, conjunctiva no swelling or erythema, normal fundi and vessels. Sinuses: No frontal/maxillary tenderness ENT/Mouth: EACs patent /  TMs  nl. Nares clear without erythema, swelling, mucoid exudates. Oral hygiene is good. No erythema, swelling, or exudate. Tongue normal, non-obstructing. Tonsils not swollen or erythematous. Hearing normal.  Neck: Supple, thyroid normal. No bruits, nodes or JVD. Respiratory: Respiratory effort normal.  BS equal and clear bilateral without rales, rhonci, wheezing or stridor. Cardio: Heart sounds are normal with regular rate and rhythm and no murmurs, rubs or gallops. Peripheral pulses are normal and equal bilaterally without edema. No aortic or femoral bruits. Chest: symmetric with normal excursions and percussion. Breasts: Symmetric, without lumps, nipple discharge, retractions, or fibrocystic changes.  Abdomen: Flat, soft, with bowl sounds. Nontender, no guarding, rebound, hernias, masses, or organomegaly.  Lymphatics: Non tender without lymphadenopathy.  Genitourinary:  Musculoskeletal: Full ROM all peripheral extremities, joint stability, 5/5 strength, and normal gait. Skin: Warm and dry without rashes, lesions, cyanosis, clubbing or  ecchymosis.  Neuro: Cranial nerves intact, reflexes equal bilaterally. Normal muscle tone, no cerebellar symptoms. Sensation intact.  Pysch: Awake and oriented X 3, normal affect, Insight and Judgment appropriate.   Assessment and Plan  1. Annual Screening Examination 2. Hypertension  3. Hyperlipidemia 4. Pre Diabetes 5. Vitamin D Deficiency 6. Depression  7.  Hx/o Conversion Reaction 8. Fibromyalgia, Hx/o  Continue prudent diet as discussed, weight control, BP monitoring, regular exercise, and medications. Discussed med's effects and SE's. Screening labs and tests as requested with regular follow-up as recommended.  Rx Alprazolam 1.0 mg to try 1/2 to 1 tab tid or qhs for sleep.

## 2014-01-31 NOTE — Patient Instructions (Signed)
 Recommend the book "The END of DIETING" by Dr Joel Furman   and the book "The END of DIABETES " by Dr Joel Fuhrman  At [employer] - get book & Audio CD's      Being diabetic has a  300% increased risk for heart attack, stroke, cancer, and alzheimer- type vascular dementia. It is very important that you work harder with diet by avoiding all foods that are white except chicken & fish. Avoid white rice (brown & wild rice is OK), white potatoes (sweetpotatoes in moderation is OK), White bread or wheat bread or anything made out of white flour like bagels, donuts, rolls, buns, biscuits, cakes, pastries, cookies, pizza crust, and pasta (made from white flour & egg whites) - vegetarian pasta or spinach or wheat pasta is OK. Multigrain breads like Arnold's or [employer], or multigrain sandwich thins or flatbreads.  Diet, exercise and weight loss can reverse and cure diabetes in the early stages.  Diet, exercise and weight loss is very important in the control and prevention of complications of diabetes which affects every system in your body, ie. Brain - dementia/stroke, eyes - glaucoma/blindness, heart - heart attack/heart failure, kidneys - dialysis, stomach - gastric paralysis, intestines - malabsorption, nerves - severe painful neuritis, circulation - gangrene & loss of a leg(s), and finally cancer and Alzheimers.    I recommend avoid fried & greasy foods,  sweets/candy, white rice (brown or wild rice or Quinoa is OK), white potatoes (sweet potatoes are OK) - anything made from white flour - bagels, doughnuts, rolls, buns, biscuits,white and wheat breads, pizza crust and traditional pasta made of white flour & egg white(vegetarian pasta or spinach or wheat pasta is OK).  Multi-grain bread is OK - like multi-grain flat bread or sandwich thins. Avoid alcohol in excess. Exercise is also important.    Eat all the vegetables you want - avoid meat, especially red meat and dairy - especially cheese.  Cheese  is the most concentrated form of trans-fats which is the worst thing to clog up our arteries. Veggie cheese is OK which can be found in the fresh produce section at [employer] or [employer] or [employer]  Preventive Care for Adults A healthy lifestyle and preventive care can promote health and wellness. Preventive health guidelines for women include the following key practices.  A routine yearly physical is a good way to check with your health care provider about your health and preventive screening. It is a chance to share any concerns and updates on your health and to receive a thorough exam.  Visit your dentist for a routine exam and preventive care every 6 months. Brush your teeth twice a day and floss once a day. Good oral hygiene prevents tooth decay and gum disease.  The frequency of eye exams is based on your age, health, family medical history, use of contact lenses, and other factors. Follow your health care provider's recommendations for frequency of eye exams.  Eat a healthy diet. Foods like vegetables, fruits, whole grains, low-fat dairy products, and lean protein foods contain the nutrients you need without too many calories. Decrease your intake of foods high in solid fats, added sugars, and salt. Eat the right amount of calories for you.Get information about a proper diet from your health care provider, if necessary.  Regular physical exercise is one of the most important things you can do for your health. Most adults should get at least 150 minutes of moderate-intensity exercise (any activity that increases   your heart rate and causes you to sweat) each week. In addition, most adults need muscle-strengthening exercises on 2 or more days a week.  Maintain a healthy weight. The body mass index (BMI) is a screening tool to identify possible weight problems. It provides an estimate of body fat based on height and weight. Your health care provider can find your BMI and can help you  achieve or maintain a healthy weight.For adults 20 years and older:  A BMI below 18.5 is considered underweight.  A BMI of 18.5 to 24.9 is normal.  A BMI of 25 to 29.9 is considered overweight.  A BMI of 30 and above is considered obese.  Maintain normal blood lipids and cholesterol levels by exercising and minimizing your intake of saturated fat. Eat a balanced diet with plenty of fruit and vegetables. Blood tests for lipids and cholesterol should begin at age 20 and be repeated every 5 years. If your lipid or cholesterol levels are high, you are over 50, or you are at high risk for heart disease, you may need your cholesterol levels checked more frequently.Ongoing high lipid and cholesterol levels should be treated with medicines if diet and exercise are not working.  If you smoke, find out from your health care provider how to quit. If you do not use tobacco, do not start.  Lung cancer screening is recommended for adults aged 55-80 years who are at high risk for developing lung cancer because of a history of smoking. A yearly low-dose CT scan of the lungs is recommended for people who have at least a 30-pack-year history of smoking and are a current smoker or have quit within the past 15 years. A pack year of smoking is smoking an average of 1 pack of cigarettes a day for 1 year (for example: 1 pack a day for 30 years or 2 packs a day for 15 years). Yearly screening should continue until the smoker has stopped smoking for at least 15 years. Yearly screening should be stopped for people who develop a health problem that would prevent them from having lung cancer treatment.  If you are pregnant, do not drink alcohol. If you are breastfeeding, be very cautious about drinking alcohol. If you are not pregnant and choose to drink alcohol, do not have more than 1 drink per day. One drink is considered to be 12 ounces (355 mL) of beer, 5 ounces (148 mL) of wine, or 1.5 ounces (44 mL) of liquor.  Avoid  use of street drugs. Do not share needles with anyone. Ask for help if you need support or instructions about stopping the use of drugs.  High blood pressure causes heart disease and increases the risk of stroke. Your blood pressure should be checked at least every 1 to 2 years. Ongoing high blood pressure should be treated with medicines if weight loss and exercise do not work.  If you are 55-79 years old, ask your health care provider if you should take aspirin to prevent strokes.  Diabetes screening involves taking a blood sample to check your fasting blood sugar level. This should be done once every 3 years, after age 45, if you are within normal weight and without risk factors for diabetes. Testing should be considered at a younger age or be carried out more frequently if you are overweight and have at least 1 risk factor for diabetes.  Breast cancer screening is essential preventive care for women. You should practice "breast self-awareness." This means understanding the   normal appearance and feel of your breasts and may include breast self-examination. Any changes detected, no matter how small, should be reported to a health care provider. Women in their 20s and 30s should have a clinical breast exam (CBE) by a health care provider as part of a regular health exam every 1 to 3 years. After age 40, women should have a CBE every year. Starting at age 40, women should consider having a mammogram (breast X-ray test) every year. Women who have a family history of breast cancer should talk to their health care provider about genetic screening. Women at a high risk of breast cancer should talk to their health care providers about having an MRI and a mammogram every year.  Breast cancer gene (BRCA)-related cancer risk assessment is recommended for women who have family members with BRCA-related cancers. BRCA-related cancers include breast, ovarian, tubal, and peritoneal cancers. Having family members with  these cancers may be associated with an increased risk for harmful changes (mutations) in the breast cancer genes BRCA1 and BRCA2. Results of the assessment will determine the need for genetic counseling and BRCA1 and BRCA2 testing.  Routine pelvic exams to screen for cancer are no longer recommended for nonpregnant women who are considered low risk for cancer of the pelvic organs (ovaries, uterus, and vagina) and who do not have symptoms. Ask your health care provider if a screening pelvic exam is right for you.  If you have had past treatment for cervical cancer or a condition that could lead to cancer, you need Pap tests and screening for cancer for at least 20 years after your treatment. If Pap tests have been discontinued, your risk factors (such as having a new sexual partner) need to be reassessed to determine if screening should be resumed. Some women have medical problems that increase the chance of getting cervical cancer. In these cases, your health care provider may recommend more frequent screening and Pap tests.  The HPV test is an additional test that may be used for cervical cancer screening. The HPV test looks for the virus that can cause the cell changes on the cervix. The cells collected during the Pap test can be tested for HPV. The HPV test could be used to screen women aged 30 years and older, and should be used in women of any age who have unclear Pap test results. After the age of 30, women should have HPV testing at the same frequency as a Pap test.  Colorectal cancer can be detected and often prevented. Most routine colorectal cancer screening begins at the age of 50 years and continues through age 75 years. However, your health care provider may recommend screening at an earlier age if you have risk factors for colon cancer. On a yearly basis, your health care provider may provide home test kits to check for hidden blood in the stool. Use of a small camera at the end of a tube, to  directly examine the colon (sigmoidoscopy or colonoscopy), can detect the earliest forms of colorectal cancer. Talk to your health care provider about this at age 50, when routine screening begins. Direct exam of the colon should be repeated every 5-10 years through age 75 years, unless early forms of pre-cancerous polyps or small growths are found.  People who are at an increased risk for hepatitis B should be screened for this virus. You are considered at high risk for hepatitis B if:  You were born in a country where hepatitis B occurs   often. Talk with your health care provider about which countries are considered high risk.  Your parents were born in a high-risk country and you have not received a shot to protect against hepatitis B (hepatitis B vaccine).  You have HIV or AIDS.  You use needles to inject street drugs.  You live with, or have sex with, someone who has hepatitis B.  You get hemodialysis treatment.  You take certain medicines for conditions like cancer, organ transplantation, and autoimmune conditions.  Hepatitis C blood testing is recommended for all people born from 1945 through 1965 and any individual with known risks for hepatitis C.  Practice safe sex. Use condoms and avoid high-risk sexual practices to reduce the spread of sexually transmitted infections (STIs). STIs include gonorrhea, chlamydia, syphilis, trichomonas, herpes, HPV, and human immunodeficiency virus (HIV). Herpes, HIV, and HPV are viral illnesses that have no cure. They can result in disability, cancer, and death.  You should be screened for sexually transmitted illnesses (STIs) including gonorrhea and chlamydia if:  You are sexually active and are younger than 24 years.  You are older than 24 years and your health care provider tells you that you are at risk for this type of infection.  Your sexual activity has changed since you were last screened and you are at an increased risk for chlamydia or  gonorrhea. Ask your health care provider if you are at risk.  If you are at risk of being infected with HIV, it is recommended that you take a prescription medicine daily to prevent HIV infection. This is called preexposure prophylaxis (PrEP). You are considered at risk if:  You are a heterosexual woman, are sexually active, and are at increased risk for HIV infection.  You take drugs by injection.  You are sexually active with a partner who has HIV.  Talk with your health care provider about whether you are at high risk of being infected with HIV. If you choose to begin PrEP, you should first be tested for HIV. You should then be tested every 3 months for as long as you are taking PrEP.  Osteoporosis is a disease in which the bones lose minerals and strength with aging. This can result in serious bone fractures or breaks. The risk of osteoporosis can be identified using a bone density scan. Women ages 65 years and over and women at risk for fractures or osteoporosis should discuss screening with their health care providers. Ask your health care provider whether you should take a calcium supplement or vitamin D to reduce the rate of osteoporosis.  Menopause can be associated with physical symptoms and risks. Hormone replacement therapy is available to decrease symptoms and risks. You should talk to your health care provider about whether hormone replacement therapy is right for you.  Use sunscreen. Apply sunscreen liberally and repeatedly throughout the day. You should seek shade when your shadow is shorter than you. Protect yourself by wearing long sleeves, pants, a wide-brimmed hat, and sunglasses year round, whenever you are outdoors.  Once a month, do a whole body skin exam, using a mirror to look at the skin on your back. Tell your health care provider of new moles, moles that have irregular borders, moles that are larger than a pencil eraser, or moles that have changed in shape or  color.  Stay current with required vaccines (immunizations).  Influenza vaccine. All adults should be immunized every year.  Tetanus, diphtheria, and acellular pertussis (Td, Tdap) vaccine. Pregnant women should receive   1 dose of Tdap vaccine during each pregnancy. The dose should be obtained regardless of the length of time since the last dose. Immunization is preferred during the 27th-36th week of gestation. An adult who has not previously received Tdap or who does not know her vaccine status should receive 1 dose of Tdap. This initial dose should be followed by tetanus and diphtheria toxoids (Td) booster doses every 10 years. Adults with an unknown or incomplete history of completing a 3-dose immunization series with Td-containing vaccines should begin or complete a primary immunization series including a Tdap dose. Adults should receive a Td booster every 10 years.  Varicella vaccine. An adult without evidence of immunity to varicella should receive 2 doses or a second dose if she has previously received 1 dose. Pregnant females who do not have evidence of immunity should receive the first dose after pregnancy. This first dose should be obtained before leaving the health care facility. The second dose should be obtained 4-8 weeks after the first dose.  Human papillomavirus (HPV) vaccine. Females aged 13-26 years who have not received the vaccine previously should obtain the 3-dose series. The vaccine is not recommended for use in pregnant females. However, pregnancy testing is not needed before receiving a dose. If a female is found to be pregnant after receiving a dose, no treatment is needed. In that case, the remaining doses should be delayed until after the pregnancy. Immunization is recommended for any person with an immunocompromised condition through the age of 26 years if she did not get any or all doses earlier. During the 3-dose series, the second dose should be obtained 4-8 weeks after the  first dose. The third dose should be obtained 24 weeks after the first dose and 16 weeks after the second dose.  Zoster vaccine. One dose is recommended for adults aged 60 years or older unless certain conditions are present.  Measles, mumps, and rubella (MMR) vaccine. Adults born before 1957 generally are considered immune to measles and mumps. Adults born in 1957 or later should have 1 or more doses of MMR vaccine unless there is a contraindication to the vaccine or there is laboratory evidence of immunity to each of the three diseases. A routine second dose of MMR vaccine should be obtained at least 28 days after the first dose for students attending postsecondary schools, health care workers, or international travelers. People who received inactivated measles vaccine or an unknown type of measles vaccine during 1963-1967 should receive 2 doses of MMR vaccine. People who received inactivated mumps vaccine or an unknown type of mumps vaccine before 1979 and are at high risk for mumps infection should consider immunization with 2 doses of MMR vaccine. For females of childbearing age, rubella immunity should be determined. If there is no evidence of immunity, females who are not pregnant should be vaccinated. If there is no evidence of immunity, females who are pregnant should delay immunization until after pregnancy. Unvaccinated health care workers born before 1957 who lack laboratory evidence of measles, mumps, or rubella immunity or laboratory confirmation of disease should consider measles and mumps immunization with 2 doses of MMR vaccine or rubella immunization with 1 dose of MMR vaccine.  Pneumococcal 13-valent conjugate (PCV13) vaccine. When indicated, a person who is uncertain of her immunization history and has no record of immunization should receive the PCV13 vaccine. An adult aged 19 years or older who has certain medical conditions and has not been previously immunized should receive 1 dose of    PCV13 vaccine. This PCV13 should be followed with a dose of pneumococcal polysaccharide (PPSV23) vaccine. The PPSV23 vaccine dose should be obtained at least 8 weeks after the dose of PCV13 vaccine. An adult aged 19 years or older who has certain medical conditions and previously received 1 or more doses of PPSV23 vaccine should receive 1 dose of PCV13. The PCV13 vaccine dose should be obtained 1 or more years after the last PPSV23 vaccine dose.  Pneumococcal polysaccharide (PPSV23) vaccine. When PCV13 is also indicated, PCV13 should be obtained first. All adults aged 65 years and older should be immunized. An adult younger than age 65 years who has certain medical conditions should be immunized. Any person who resides in a nursing home or long-term care facility should be immunized. An adult smoker should be immunized. People with an immunocompromised condition and certain other conditions should receive both PCV13 and PPSV23 vaccines. People with human immunodeficiency virus (HIV) infection should be immunized as soon as possible after diagnosis. Immunization during chemotherapy or radiation therapy should be avoided. Routine use of PPSV23 vaccine is not recommended for American Indians, [place] Natives, or people younger than 65 years unless there are medical conditions that require PPSV23 vaccine. When indicated, people who have unknown immunization and have no record of immunization should receive PPSV23 vaccine. One-time revaccination 5 years after the first dose of PPSV23 is recommended for people aged 19-64 years who have chronic kidney failure, nephrotic syndrome, asplenia, or immunocompromised conditions. People who received 1-2 doses of PPSV23 before age 65 years should receive another dose of PPSV23 vaccine at age 65 years or later if at least 5 years have passed since the previous dose. Doses of PPSV23 are not needed for people immunized with PPSV23 at or after age 65 years.  Meningococcal vaccine.  Adults with asplenia or persistent complement component deficiencies should receive 2 doses of quadrivalent meningococcal conjugate (MenACWY-D) vaccine. The doses should be obtained at least 2 months apart. Microbiologists working with certain meningococcal bacteria, military recruits, people at risk during an outbreak, and people who travel to or live in countries with a high rate of meningitis should be immunized. A first-year college student up through age 21 years who is living in a residence hall should receive a dose if she did not receive a dose on or after her 16th birthday. Adults who have certain high-risk conditions should receive one or more doses of vaccine.  Hepatitis A vaccine. Adults who wish to be protected from this disease, have certain high-risk conditions, work with hepatitis A-infected animals, work in hepatitis A research labs, or travel to or work in countries with a high rate of hepatitis A should be immunized. Adults who were previously unvaccinated and who anticipate close contact with an international adoptee during the first 60 days after arrival in the [place] from a country with a high rate of hepatitis A should be immunized.  Hepatitis B vaccine. Adults who wish to be protected from this disease, have certain high-risk conditions, may be exposed to blood or other infectious body fluids, are household contacts or sex partners of hepatitis B positive people, are clients or workers in certain care facilities, or travel to or work in countries with a high rate of hepatitis B should be immunized.  Haemophilus influenzae type b (Hib) vaccine. A previously unvaccinated person with asplenia or sickle cell disease or having a scheduled splenectomy should receive 1 dose of Hib vaccine. Regardless of previous immunization, a recipient of a hematopoietic stem   cell transplant should receive a 3-dose series 6-12 months after her successful transplant. Hib vaccine is not recommended for  adults with HIV infection. Preventive Services / Frequency  Ages 40 to 64 years  Blood pressure check.** / Every 1 to 2 years.  Lipid and cholesterol check.** / Every 5 years beginning at age 20 years.  Lung cancer screening. / Every year if you are aged 55-80 years and have a 30-pack-year history of smoking and currently smoke or have quit within the past 15 years. Yearly screening is stopped once you have quit smoking for at least 15 years or develop a health problem that would prevent you from having lung cancer treatment.  Clinical breast exam.** / Every year after age 40 years.  BRCA-related cancer risk assessment.** / For women who have family members with a BRCA-related cancer (breast, ovarian, tubal, or peritoneal cancers).  Mammogram.** / Every year beginning at age 40 years and continuing for as long as you are in good health. Consult with your health care provider.  Pap test.** / Every 3 years starting at age 30 years through age 65 or 70 years with a history of 3 consecutive normal Pap tests.  HPV screening.** / Every 3 years from ages 30 years through ages 65 to 70 years with a history of 3 consecutive normal Pap tests.  Fecal occult blood test (FOBT) of stool. / Every year beginning at age 50 years and continuing until age 75 years. You may not need to do this test if you get a colonoscopy every 10 years.  Flexible sigmoidoscopy or colonoscopy.** / Every 5 years for a flexible sigmoidoscopy or every 10 years for a colonoscopy beginning at age 50 years and continuing until age 75 years.  Hepatitis C blood test.** / For all people born from 1945 through 1965 and any individual with known risks for hepatitis C.  Skin self-exam. / Monthly.  Influenza vaccine. / Every year.  Tetanus, diphtheria, and acellular pertussis (Tdap/Td) vaccine.** / Consult your health care provider. Pregnant women should receive 1 dose of Tdap vaccine during each pregnancy. 1 dose of Td every 10  years.  Varicella vaccine.** / Consult your health care provider. Pregnant females who do not have evidence of immunity should receive the first dose after pregnancy.  Zoster vaccine.** / 1 dose for adults aged 60 years or older.  Measles, mumps, rubella (MMR) vaccine.** / You need at least 1 dose of MMR if you were born in 1957 or later. You may also need a 2nd dose. For females of childbearing age, rubella immunity should be determined. If there is no evidence of immunity, females who are not pregnant should be vaccinated. If there is no evidence of immunity, females who are pregnant should delay immunization until after pregnancy.  Pneumococcal 13-valent conjugate (PCV13) vaccine.** / Consult your health care provider.  Pneumococcal polysaccharide (PPSV23) vaccine.** / 1 to 2 doses if you smoke cigarettes or if you have certain conditions.  Meningococcal vaccine.** / Consult your health care provider.  Hepatitis A vaccine.** / Consult your health care provider.  Hepatitis B vaccine.** / Consult your health care provider.  Haemophilus influenzae type b (Hib) vaccine.** / Consult your health care provider.  

## 2014-02-01 LAB — MICROALBUMIN / CREATININE URINE RATIO
Creatinine, Urine: 87.1 mg/dL
MICROALB UR: 0.5 mg/dL (ref 0.00–1.89)
MICROALB/CREAT RATIO: 5.7 mg/g (ref 0.0–30.0)

## 2014-02-01 LAB — INSULIN, FASTING: INSULIN FASTING, SERUM: 39 u[IU]/mL — AB (ref 3–28)

## 2014-02-01 LAB — VITAMIN D 25 HYDROXY (VIT D DEFICIENCY, FRACTURES): VIT D 25 HYDROXY: 73 ng/mL (ref 30–89)

## 2014-02-02 ENCOUNTER — Other Ambulatory Visit: Payer: Self-pay | Admitting: Internal Medicine

## 2014-02-02 LAB — HEPATITIS B E ANTIBODY: Hepatitis Be Antibody: NONREACTIVE

## 2014-02-15 ENCOUNTER — Other Ambulatory Visit: Payer: 59

## 2014-02-15 DIAGNOSIS — Z1212 Encounter for screening for malignant neoplasm of rectum: Secondary | ICD-10-CM

## 2014-02-15 LAB — POC HEMOCCULT BLD/STL (HOME/3-CARD/SCREEN)
Card #2 Fecal Occult Blod, POC: NEGATIVE
Card #3 Fecal Occult Blood, POC: NEGATIVE
FECAL OCCULT BLD: NEGATIVE

## 2014-03-07 ENCOUNTER — Other Ambulatory Visit: Payer: Self-pay | Admitting: Internal Medicine

## 2014-04-05 ENCOUNTER — Other Ambulatory Visit: Payer: Self-pay | Admitting: Internal Medicine

## 2014-04-09 ENCOUNTER — Other Ambulatory Visit: Payer: Self-pay | Admitting: Internal Medicine

## 2014-04-25 ENCOUNTER — Other Ambulatory Visit: Payer: Self-pay

## 2014-04-25 DIAGNOSIS — Z1231 Encounter for screening mammogram for malignant neoplasm of breast: Secondary | ICD-10-CM

## 2014-05-15 ENCOUNTER — Ambulatory Visit: Payer: Self-pay | Admitting: Physician Assistant

## 2014-05-16 ENCOUNTER — Ambulatory Visit (INDEPENDENT_AMBULATORY_CARE_PROVIDER_SITE_OTHER): Payer: Medicare Other | Admitting: Physician Assistant

## 2014-05-16 ENCOUNTER — Ambulatory Visit
Admission: RE | Admit: 2014-05-16 | Discharge: 2014-05-16 | Disposition: A | Discharge status: Home / Self Care | Payer: Medicare Other | Source: Ambulatory Visit

## 2014-05-16 VITALS — BP 118/72 | HR 84 | Temp 98.1°F | Resp 16 | Wt 186.0 lb

## 2014-05-16 DIAGNOSIS — M1711 Unilateral primary osteoarthritis, right knee: Secondary | ICD-10-CM

## 2014-05-16 DIAGNOSIS — Z79899 Other long term (current) drug therapy: Secondary | ICD-10-CM

## 2014-05-16 DIAGNOSIS — R7303 Prediabetes: Secondary | ICD-10-CM

## 2014-05-16 DIAGNOSIS — M797 Fibromyalgia: Secondary | ICD-10-CM

## 2014-05-16 DIAGNOSIS — E538 Deficiency of other specified B group vitamins: Secondary | ICD-10-CM

## 2014-05-16 DIAGNOSIS — Z1231 Encounter for screening mammogram for malignant neoplasm of breast: Secondary | ICD-10-CM

## 2014-05-16 DIAGNOSIS — F329 Major depressive disorder, single episode, unspecified: Secondary | ICD-10-CM

## 2014-05-16 DIAGNOSIS — E559 Vitamin D deficiency, unspecified: Secondary | ICD-10-CM

## 2014-05-16 DIAGNOSIS — I10 Essential (primary) hypertension: Secondary | ICD-10-CM

## 2014-05-16 DIAGNOSIS — E669 Obesity, unspecified: Secondary | ICD-10-CM

## 2014-05-16 DIAGNOSIS — M179 Osteoarthritis of knee, unspecified: Secondary | ICD-10-CM

## 2014-05-16 DIAGNOSIS — R6889 Other general symptoms and signs: Secondary | ICD-10-CM

## 2014-05-16 DIAGNOSIS — E782 Mixed hyperlipidemia: Secondary | ICD-10-CM

## 2014-05-16 DIAGNOSIS — Z9181 History of falling: Secondary | ICD-10-CM

## 2014-05-16 DIAGNOSIS — F32A Depression, unspecified: Secondary | ICD-10-CM

## 2014-05-16 DIAGNOSIS — Z0001 Encounter for general adult medical examination with abnormal findings: Secondary | ICD-10-CM

## 2014-05-16 DIAGNOSIS — Z23 Encounter for immunization: Secondary | ICD-10-CM

## 2014-05-16 DIAGNOSIS — R7309 Other abnormal glucose: Secondary | ICD-10-CM

## 2014-05-16 LAB — CBC WITH DIFFERENTIAL/PLATELET
BASOS ABS: 0 10*3/uL (ref 0.0–0.1)
Basophils Relative: 0 % (ref 0–1)
Eosinophils Absolute: 0.2 10*3/uL (ref 0.0–0.7)
Eosinophils Relative: 3 % (ref 0–5)
HCT: 42.8 % (ref 36.0–46.0)
Hemoglobin: 14.5 g/dL (ref 12.0–15.0)
LYMPHS PCT: 38 % (ref 12–46)
Lymphs Abs: 1.9 10*3/uL (ref 0.7–4.0)
MCH: 30 pg (ref 26.0–34.0)
MCHC: 33.9 g/dL (ref 30.0–36.0)
MCV: 88.6 fL (ref 78.0–100.0)
MONO ABS: 0.5 10*3/uL (ref 0.1–1.0)
MPV: 10 fL (ref 9.4–12.4)
Monocytes Relative: 10 % (ref 3–12)
NEUTROS ABS: 2.5 10*3/uL (ref 1.7–7.7)
Neutrophils Relative %: 49 % (ref 43–77)
PLATELETS: 348 10*3/uL (ref 150–400)
RBC: 4.83 MIL/uL (ref 3.87–5.11)
RDW: 13.8 % (ref 11.5–15.5)
WBC: 5.1 10*3/uL (ref 4.0–10.5)

## 2014-05-16 MED ORDER — CYCLOBENZAPRINE HCL 10 MG PO TABS: 10.0000 mg | ORAL_TABLET | Freq: Three times a day (TID) | ORAL | Status: DC | PRN

## 2014-05-16 MED ORDER — TRAMADOL HCL 50 MG PO TABS: 50.0000 mg | ORAL_TABLET | Freq: Four times a day (QID) | ORAL | Status: DC | PRN

## 2014-05-16 NOTE — Patient Instructions (Signed)
What is the TMJ? The temporomandibular (tem-PUH-ro-man-DIB-yoo-ler) joint, or the TMJ, connects the upper and lower jawbones. This joint allows the jaw to open wide and move back and forth when you chew, talk, or yawn.There are also several muscles that help this joint move. There can be muscle tightness and pain in the muscle that can cause several symptoms.  What causes TMJ pain? There are many causes of TMJ pain. Repeated chewing (for example, chewing gum) and clenching your teeth can cause pain in the joint. Some TMJ pain has no obvious cause. What can I do to ease the pain? There are many things you can do to help your pain get better. When you have pain:  Eat soft foods and stay away from chewy foods (for example, taffy) Try to use both sides of your mouth to chew Don't chew gum Don't open your mouth wide (for example, during yawning or singing) Don't bite your cheeks or fingernails Lower your amount of stress and worry Applying a warm, damp washcloth to the joint may help. Over-the-counter pain medicines such as ibuprofen (one brand: Advil) or acetaminophen (one brand: Tylenol) might also help. Do not use these medicines if you are allergic to them or if your doctor told you not to use them. How can I stop the pain from coming back? When your pain is better, you can do these exercises to make your muscles stronger and to keep the pain from coming back:  Resisted mouth opening: Place your thumb or two fingers under your chin and open your mouth slowly, pushing up lightly on your chin with your thumb. Hold for three to six seconds. Close your mouth slowly. Resisted mouth closing: Place your thumbs under your chin and your two index fingers on the ridge between your mouth and the bottom of your chin. Push down lightly on your chin as you close your mouth. Tongue up: Slowly open and close your mouth while keeping the tongue touching the roof of the mouth. Side-to-side jaw movement: Place an  object about one fourth of an inch thick (for example, two tongue depressors) between your front teeth. Slowly move your jaw from side to side. Increase the thickness of the object as the exercise becomes easier Forward jaw movement: Place an object about one fourth of an inch thick between your front teeth and move the bottom jaw forward so that the bottom teeth are in front of the top teeth. Increase the thickness of the object as the exercise becomes easier. These exercises should not be painful. If it hurts to do these exercises, stop doing them and talk to your family doctor.   Preventative Care for Adults - Female      MAINTAIN REGULAR HEALTH EXAMS:  A routine yearly physical is a good way to check in with your primary care provider about your health and preventive screening. It is also an opportunity to share updates about your health and any concerns you have, and receive a thorough all-over exam.   Most health insurance companies pay for at least some preventative services.  Check with your health plan for specific coverages.  WHAT PREVENTATIVE SERVICES DO WOMEN NEED?  Adult women should have their weight and blood pressure checked regularly.   Women age 35 and older should have their cholesterol levels checked regularly.  Women should be screened for cervical cancer with a Pap smear and pelvic exam beginning at either age 21, or 3 years after they become sexually activity.      Breast cancer screening generally begins at age 40 with a mammogram and breast exam by your primary care provider.    Beginning at age 50 and continuing to age 75, women should be screened for colorectal cancer.  Certain people may need continued testing until age 85.  Updating vaccinations is part of preventative care.  Vaccinations help protect against diseases such as the flu.  Osteoporosis is a disease in which the bones lose minerals and strength as we age. Women ages 65 and over should discuss this  with their caregivers, as should women after menopause who have other risk factors.  Lab tests are generally done as part of preventative care to screen for anemia and blood disorders, to screen for problems with the kidneys and liver, to screen for bladder problems, to check blood sugar, and to check your cholesterol level.  Preventative services generally include counseling about diet, exercise, avoiding tobacco, drugs, excessive alcohol consumption, and sexually transmitted infections.    GENERAL RECOMMENDATIONS FOR GOOD HEALTH:  Healthy diet:  Eat a variety of foods, including fruit, vegetables, animal or vegetable protein, such as meat, fish, chicken, and eggs, or beans, lentils, tofu, and grains, such as rice.  Drink plenty of water daily.  Decrease saturated fat in the diet, avoid lots of red meat, processed foods, sweets, fast foods, and fried foods.  Exercise:  Aerobic exercise helps maintain good heart health. At least 30-40 minutes of moderate-intensity exercise is recommended. For example, a brisk walk that increases your heart rate and breathing. This should be done on most days of the week.   Find a type of exercise or a variety of exercises that you enjoy so that it becomes a part of your daily life.  Examples are running, walking, swimming, water aerobics, and biking.  For motivation and support, explore group exercise such as aerobic class, spin class, Zumba, Yoga,or  martial arts, etc.    Set exercise goals for yourself, such as a certain weight goal, walk or run in a race such as a 5k walk/run.  Speak to your primary care provider about exercise goals.  Disease prevention:  If you smoke or chew tobacco, find out from your caregiver how to quit. It can literally save your life, no matter how long you have been a tobacco user. If you do not use tobacco, never begin.   Maintain a healthy diet and normal weight. Increased weight leads to problems with blood pressure and  diabetes.   The Body Mass Index or BMI is a way of measuring how much of your body is fat. Having a BMI above 27 increases the risk of heart disease, diabetes, hypertension, stroke and other problems related to obesity. Your caregiver can help determine your BMI and based on it develop an exercise and dietary program to help you achieve or maintain this important measurement at a healthful level.  High blood pressure causes heart and blood vessel problems.  Persistent high blood pressure should be treated with medicine if weight loss and exercise do not work.   Fat and cholesterol leaves deposits in your arteries that can block them. This causes heart disease and vessel disease elsewhere in your body.  If your cholesterol is found to be high, or if you have heart disease or certain other medical conditions, then you may need to have your cholesterol monitored frequently and be treated with medication.   Ask if you should have a cardiac stress test if your history suggests this. A stress   test is a test done on a treadmill that looks for heart disease. This test can find disease prior to there being a problem.  Menopause can be associated with physical symptoms and risks. Hormone replacement therapy is available to decrease these. You should talk to your caregiver about whether starting or continuing to take hormones is right for you.   Osteoporosis is a disease in which the bones lose minerals and strength as we age. This can result in serious bone fractures. Risk of osteoporosis can be identified using a bone density scan. Women ages 65 and over should discuss this with their caregivers, as should women after menopause who have other risk factors. Ask your caregiver whether you should be taking a calcium supplement and Vitamin D, to reduce the rate of osteoporosis.   Avoid drinking alcohol in excess (more than two drinks per day).  Avoid use of street drugs. Do not share needles with anyone. Ask for  professional help if you need assistance or instructions on stopping the use of alcohol, cigarettes, and/or drugs.  Brush your teeth twice a day with fluoride toothpaste, and floss once a day. Good oral hygiene prevents tooth decay and gum disease. The problems can be painful, unattractive, and can cause other health problems. Visit your dentist for a routine oral and dental check up and preventive care every 6-12 months.   Look at your skin regularly.  Use a mirror to look at your back. Notify your caregivers of changes in moles, especially if there are changes in shapes, colors, a size larger than a pencil eraser, an irregular border, or development of new moles.  Safety:  Use seatbelts 100% of the time, whether driving or as a passenger.  Use safety devices such as hearing protection if you work in environments with loud noise or significant background noise.  Use safety glasses when doing any work that could send debris in to the eyes.  Use a helmet if you ride a bike or motorcycle.  Use appropriate safety gear for contact sports.  Talk to your caregiver about gun safety.  Use sunscreen with a SPF (or skin protection factor) of 15 or greater.  Lighter skinned people are at a greater risk of skin cancer. Don't forget to also wear sunglasses in order to protect your eyes from too much damaging sunlight. Damaging sunlight can accelerate cataract formation.   Practice safe sex. Use condoms. Condoms are used for birth control and to help reduce the spread of sexually transmitted infections (or STIs).  Some of the STIs are gonorrhea (the clap), chlamydia, syphilis, trichomonas, herpes, HPV (human papilloma virus) and HIV (human immunodeficiency virus) which causes AIDS. The herpes, HIV and HPV are viral illnesses that have no cure. These can result in disability, cancer and death.   Keep carbon monoxide and smoke detectors in your home functioning at all times. Change the batteries every 6 months or use  a model that plugs into the wall.   Vaccinations:  Stay up to date with your tetanus shots and other required immunizations. You should have a booster for tetanus every 10 years. Be sure to get your flu shot every year, since 5%-20% of the [place] population comes down with the flu. The flu vaccine changes each year, so being vaccinated once is not enough. Get your shot in the fall, before the flu season peaks.   Other vaccines to consider:  Human Papilloma Virus or HPV causes cancer of the cervix, and other infections that can   be transmitted from person to person. There is a vaccine for HPV, and females should get immunized between the ages of 11 and 26. It requires a series of 3 shots.   Pneumococcal vaccine to protect against certain types of pneumonia.  This is normally recommended for adults age 65 or older.  However, adults younger than 57 years old with certain underlying conditions such as diabetes, heart or lung disease should also receive the vaccine.  Shingles vaccine to protect against Varicella Zoster if you are older than age 60, or younger than 57 years old with certain underlying illness.  Hepatitis A vaccine to protect against a form of infection of the liver by a virus acquired from food.  Hepatitis B vaccine to protect against a form of infection of the liver by a virus acquired from blood or body fluids, particularly if you work in health care.  If you plan to travel internationally, check with your local health department for specific vaccination recommendations.  Cancer Screening:  Breast cancer screening is essential to preventive care for women. All women age 20 and older should perform a breast self-exam every month. At age 40 and older, women should have their caregiver complete a breast exam each year. Women at ages 40 and older should have a mammogram (x-ray film) of the breasts. Your caregiver can discuss how often you need mammograms.    Cervical cancer screening  includes taking a Pap smear (sample of cells examined under a microscope) from the cervix (end of the uterus). It also includes testing for HPV (Human Papilloma Virus, which can cause cervical cancer). Screening and a pelvic exam should begin at age 21, or 3 years after a woman becomes sexually active. Screening should occur every year, with a Pap smear but no HPV testing, up to age 30. After age 30, you should have a Pap smear every 3 years with HPV testing, if no HPV was found previously.   Most routine colon cancer screening begins at the age of 50. On a yearly basis, doctors may provide special easy to use take-home tests to check for hidden blood in the stool. Sigmoidoscopy or colonoscopy can detect the earliest forms of colon cancer and is life saving. These tests use a small camera at the end of a tube to directly examine the colon. Speak to your caregiver about this at age 50, when routine screening begins (and is repeated every 5 years unless early forms of pre-cancerous polyps or small growths are found).     

## 2014-05-16 NOTE — Progress Notes (Signed)
MEDICARE ANNUAL WELLNESS VISIT AND FOLLOW UP  Assessment:   1. Essential hypertension - CBC with Differential - BASIC METABOLIC PANEL WITH GFR - Hepatic function panel - TSH  2. Osteoarthritis of right knee, unspecified osteoarthritis type controlled  3. Fibromyalgia controlled  4. Mixed hyperlipidemia -continue medications, check lipids, decrease fatty foods, increase activity. - Lipid panel  5. Prediabetes Discussed general issues about diabetes pathophysiology and management., Educational material distributed., Suggested low cholesterol diet., Encouraged aerobic exercise., Discussed foot care., Reminded to get yearly retinal exam. - Hemoglobin A1c - Insulin, fasting - HM DIABETES FOOT EXAM  6. Vitamin D deficiency - Vit D  25 hydroxy (rtn osteoporosis monitoring)  7. Medication management - Magnesium  8. Depression + screening, follow up with Dr. Tomasita Crumble, no SI/HI  9. Obesity (BMI 30.5) Obesity with co morbidities- long discussion about weight loss, diet, and exercise  10. B12 deficiency - Vitamin B12  11. Need for prophylactic vaccination and inoculation against influenza - Flu vaccine greater than or equal to 3yo with preservative IM  12. Need for pneumococcal vaccination - Pneumococcal conjugate vaccine 13-valent IM   Plan:   During the course of the visit the patient was educated and counseled about appropriate screening and preventive services including:    Pneumococcal vaccine   Influenza vaccine  Td vaccine  Screening electrocardiogram  Screening mammography  Bone densitometry screening  Colorectal cancer screening  Diabetes screening  Glaucoma screening  Nutrition counseling   Advanced directives: given info/requested  Screening recommendations, referrals:  Vaccinations: Please see documentation below and orders this visit.   Nutrition assessed and recommended  Colonoscopy due 2019 Mammogram requested Pap smear not  indicated Pelvic exam not indicated Recommended yearly ophthalmology/optometry visit for glaucoma screening and checkup Recommended yearly dental visit for hygiene and checkup Advanced directives - requested  Conditions/risks identified: BMI: Discussed weight loss, diet, and increase physical activity.  Increase physical activity: AHA recommends 150 minutes of physical activity a week.  Medications reviewed DEXA- due this year Diabetes is at goal, ACE/ARB therapy: No, Reason not on Ace Inhibitor/ARB therapy:  PreDM Urinary Incontinence is not an issue: discussed non pharmacology and pharmacology options.  Fall risk: moderate- discussed PT, home fall assessment, medications.    Subjective:   [name] is a 58 y.o. female who presents for Medicare Annual Wellness Visit and 3 month follow up on hypertension, prediabetes, hyperlipidemia, vitamin D def.  Date of last medicare wellness visit is unknown.   Her blood pressure has been controlled at home, today their BP is BP: 118/72 mmHg She does not workout, walks occ. She denies chest pain, shortness of breath, dizziness.  She is on cholesterol medication and denies myalgias. Her cholesterol is at goal. The cholesterol last visit was:   Lab Results  Component Value Date   CHOL 168 01/31/2014   HDL 56 01/31/2014   LDLCALC 88 01/31/2014   TRIG 121 01/31/2014   CHOLHDL 3.0 01/31/2014   She has been working on diet and exercise for prediabetes, and denies paresthesia of the feet, polydipsia and polyuria. Last A1C in the office was:  Lab Results  Component Value Date   HGBA1C 5.7* 01/31/2014   Patient is on Vitamin D supplement. Lab Results  Component Value Date   VD25OH 73 01/31/2014      Names of Other Physician/Practitioners you currently use: 1. [facility]- here for primary care 2. Dr. Sabra Heck, eye doctor, last visit this year 3. Dr. Quillian Quince, dentist,  last visit this year Patient Care  Team: Unk Pinto, MD as PCP - General (Internal Medicine) Alta Corning, MD as Consulting Physician (Orthopedic Surgery) Milus Banister, MD as Attending Physician (Gastroenterology) [name], MD as Consulting Physician (Psychiatry) [name], OD (Optometry) [name], DDS as Referring Physician (Dentistry)  Medication Review Current Outpatient Prescriptions on File Prior to Visit  Medication Sig Dispense Refill  . ACCU-CHEK AVIVA PLUS test strip USE AS DIRECTED DAILY 100 each 99  . acetaminophen (TYLENOL) 500 MG tablet Take 500-1,000 mg by mouth every 6 (six) hours as needed for pain.    Marland Kitchen aspirin 81 MG tablet Take 81 mg by mouth daily.    . Cholecalciferol (VITAMIN D) 2000 UNITS tablet Take 8,000 Units by mouth. Takes 8,000 units M,W,F only    . CRESTOR 40 MG tablet TAKE 1 TABLET BY MOUTH DAILY 30 tablet 2  . fexofenadine (ALLEGRA) 60 MG tablet Take 60 mg by mouth daily.    . Glucosamine-Chondroitin (MOVE FREE PO) Take 2 tablets by mouth daily.    . hydrochlorothiazide (HYDRODIURIL) 25 MG tablet TAKE 1 TABLET BY MOUTH EVERY MORNING AS NEEDED FOR BLOOD PRESSURE AND FLUID 90 tablet 0  . lamoTRIgine (LAMICTAL) 150 MG tablet Take 150 mg by mouth at bedtime.    . Magnesium 250 MG TABS Take 250 mg by mouth. Takes 5 pills per day    . Multiple Vitamin (MULTIVITAMIN WITH MINERALS) TABS tablet Take 0.5 tablets by mouth 2 (two) times daily.    . Omega-3 Fatty Acids (FISH OIL) 1000 MG CAPS Take by mouth daily.    . QUEtiapine (SEROQUEL) 100 MG tablet Take 100 mg by mouth 3 (three) times daily.    . traMADol (ULTRAM) 50 MG tablet Take 50 mg by mouth every 6 (six) hours as needed for pain.     No current facility-administered medications on file prior to visit.    Current Problems (verified) Patient Active Problem List   Diagnosis Date Noted  . Encounter for long-term (current) use of other medications 01/31/2014  . Fibromyalgia 01/31/2014  . Depression 01/31/2014  .  Obesity (BMI 30.5) 01/31/2014  . Hypertension 08/09/2013  . Mixed hyperlipidemia 08/09/2013  . PreDiabetes 08/09/2013  . Vitamin D Deficiency 08/09/2013  . Osteoarthritis of left knee 04/17/2013  . Osteoarthritis of right knee 04/14/2013    Screening Tests Health Maintenance  Topic Date Due  . PAP SMEAR  01/02/1975  . INFLUENZA VACCINE  01/20/2014  . TETANUS/TDAP  06/22/2014  . MAMMOGRAM  03/30/2015  . COLONOSCOPY  12/11/2017     Immunization History  Administered Date(s) Administered  . Influenza Split 04/06/2012, 04/05/2013  . Pneumococcal-Unspecified 06/22/1998  . Td 06/22/2004    Preventative care: Last colonoscopy: 2009 due 2019 Last mammogram: 03/2013 getting today Last pap smear/pelvic exam: remote   DEXA:2013 will prefer to wait until next MGM  Prior vaccinations: TD or Tdap: 2006  Influenza: 2015 TODAY  Pneumococcal: 2000 Prevnar13: TODAY Shingles/Zostavax: declines  History reviewed: allergies, current medications, past family history, past medical history, past social history, past surgical history and problem list       ACCU-CHEK AVIVA PLUS test strip  Generic drug:  glucose blood  USE AS DIRECTED DAILY     acetaminophen 500 MG tablet  Commonly known as:  TYLENOL  Take 500-1,000 mg by mouth every 6 (six) hours as needed for pain.     aspirin 81 MG tablet  Take 81 mg by mouth daily.  clonazePAM 1 MG tablet  Commonly known as:  KLONOPIN  Take 1 mg by mouth at bedtime.     CRESTOR 40 MG tablet  Generic drug:  rosuvastatin  TAKE 1 TABLET BY MOUTH DAILY     fexofenadine 60 MG tablet  Commonly known as:  ALLEGRA  Take 60 mg by mouth daily.     Fish Oil 1000 MG Caps  Take by mouth daily.     hydrochlorothiazide 25 MG tablet  Commonly known as:  HYDRODIURIL  TAKE 1 TABLET BY MOUTH EVERY MORNING AS NEEDED FOR BLOOD PRESSURE AND FLUID     lamoTRIgine 150 MG tablet  Commonly known as:  LAMICTAL  Take 150 mg by mouth at bedtime.      Magnesium 250 MG Tabs  Take 250 mg by mouth. Takes 5 pills per day     MOVE FREE PO  Take 2 tablets by mouth daily.     multivitamin with minerals Tabs tablet  Take 0.5 tablets by mouth 2 (two) times daily.     QUEtiapine 100 MG tablet  Commonly known as:  SEROQUEL  Take 100 mg by mouth 3 (three) times daily.     traMADol 50 MG tablet  Commonly known as:  ULTRAM  Take 50 mg by mouth every 6 (six) hours as needed for pain.     Vitamin D 2000 UNITS tablet  Take 8,000 Units by mouth. Takes 8,000 units M,W,F only        Past Surgical History  Procedure Laterality Date  . Abdominal hysterectomy    . Joint replacement      arthroscopy  . Total knee arthroplasty Right 04/14/2013    Procedure: RIGHT TOTAL KNEE ARTHROPLASTY AND LEFT KNEE INJECTION ;  Surgeon: Alta Corning, MD;  Location: [place];  Service: Orthopedics;  Laterality: Right;   Family History  Problem Relation Age of Onset  . Hypertension Mother   . Hypertension Father   . COPD Father   . Cancer Father     thyroid  . CVA Maternal Grandmother     Risk Factors: Osteoporosis/FallRisk: postmenopausal estrogen deficiency and dietary calcium and/or vitamin D deficiency In the past year have you fallen or had a near fall?:Yes History of fracture in the past year: no  Tobacco History  Substance Use Topics  . Smoking status: Never Smoker   . Smokeless tobacco: Not on file  . Alcohol Use: No   She does not smoke.  Patient is not a former smoker. Are there smokers in your home (other than you)?  No  Alcohol Current alcohol use: none  Caffeine Current caffeine use: coffee 1 /day  Exercise Current exercise: no regular exercise  Nutrition/Diet Current diet: in general, a "healthy" diet    Cardiac risk factors: dyslipidemia, hypertension and sedentary lifestyle.  Depression Screen (Note: if answer to either of the following is "Yes", a more complete depression screening is indicated)   Q1: Over the past  two weeks, have you felt down, depressed or hopeless? Yes  Q2: Over the past two weeks, have you felt little interest or pleasure in doing things? Yes  Have you lost interest or pleasure in daily life? No  Do you often feel hopeless? No  Do you cry easily over simple problems? No  Activities of Daily Living In your present state of health, do you have any difficulty performing the following activities?:  Driving? No Managing money?  No Feeding yourself? No Getting from bed to chair? No  Climbing a flight of stairs? No Preparing food and eating?: No Bathing or showering? No Getting dressed: No Getting to the toilet? No Using the toilet:No Moving around from place to place: No   Are you sexually active?  No  Do you have more than one partner?  No  Vision Difficulties: Yes  Hearing Difficulties: No Do you often ask people to speak up or repeat themselves? No Do you experience ringing or noises in your ears? No Do you have difficulty understanding soft or whispered voices? No  Cognition  Do you feel that you have a problem with memory?Yes  Do you often misplace items? No  Do you feel safe at home?  Yes  Advanced directives Does patient have a [place]? No Does patient have a Living Will? No   Objective:   Blood pressure 118/72, pulse 84, temperature 98.1 F (36.7 C), resp. rate 16, weight 186 lb (84.369 kg). Body mass index is 30.95 kg/(m^2).  General appearance: alert, no distress, WD/WN,  female Cognitive Testing  Alert? Yes  Normal Appearance?Yes  Oriented to person? Yes  Place? Yes   Time? Yes  Recall of three objects?  2/3  Can perform simple calculations? Yes  Displays appropriate judgment?Yes  Can read the correct time from a watch face?Yes  HEENT: normocephalic, sclerae anicteric, TMs pearly, nares patent, no discharge or erythema, pharynx normal Oral cavity: MMM, no lesions Neck: supple, no lymphadenopathy, no thyromegaly, no  masses Heart: RRR, normal S1, S2, no murmurs Lungs: CTA bilaterally, no wheezes, rhonchi, or rales Abdomen: +bs, soft, non tender, non distended, no masses, no hepatomegaly, no splenomegaly Musculoskeletal: nontender, no swelling, no obvious deformity Extremities: no edema, no cyanosis, no clubbing Pulses: 2+ symmetric, upper and lower extremities, normal cap refill Neurological: alert, oriented x 3, CN2-12 intact, strength normal upper extremities and lower extremities, sensation normal throughout, DTRs 2+ throughout, no cerebellar signs, gait normal Psychiatric: normal affect, behavior normal, pleasant  Breast: defer Gyn: defer Rectal: defer  Medicare Attestation I have personally reviewed: The patient's medical and social history Their use of alcohol, tobacco or illicit drugs Their current medications and supplements The patient's functional ability including ADLs,fall risks, home safety risks, cognitive, and hearing and visual impairment Diet and physical activities Evidence for depression or mood disorders  The patient's weight, height, BMI, and visual acuity have been recorded in the chart.  I have made referrals, counseling, and provided education to the patient based on review of the above and I have provided the patient with a written personalized care plan for preventive services.     Vicie Mutters, PA-C   05/16/2014

## 2014-05-17 LAB — VITAMIN B12: Vitamin B-12: 739 pg/mL (ref 211–911)

## 2014-05-17 LAB — BASIC METABOLIC PANEL WITH GFR
BUN: 18 mg/dL (ref 6–23)
CALCIUM: 10.2 mg/dL (ref 8.4–10.5)
CO2: 27 mEq/L (ref 19–32)
Chloride: 101 mEq/L (ref 96–112)
Creat: 0.69 mg/dL (ref 0.50–1.10)
GFR, Est Non African American: >89 mL/min
GLUCOSE: 94 mg/dL (ref 70–99)
Potassium: 3.9 mEq/L (ref 3.5–5.3)
SODIUM: 140 meq/L (ref 135–145)

## 2014-05-17 LAB — VITAMIN D 25 HYDROXY (VIT D DEFICIENCY, FRACTURES): VIT D 25 HYDROXY: 48 ng/mL (ref 30–100)

## 2014-05-17 LAB — HEPATIC FUNCTION PANEL
ALT: 40 U/L — AB (ref 0–35)
AST: 28 U/L (ref 0–37)
Albumin: 4.7 g/dL (ref 3.5–5.2)
Alkaline Phosphatase: 100 U/L (ref 39–117)
BILIRUBIN DIRECT: 0.2 mg/dL (ref 0.0–0.3)
Indirect Bilirubin: 0.5 mg/dL (ref 0.2–1.2)
TOTAL PROTEIN: 7.6 g/dL (ref 6.0–8.3)
Total Bilirubin: 0.7 mg/dL (ref 0.2–1.2)

## 2014-05-17 LAB — LIPID PANEL
CHOLESTEROL: 177 mg/dL (ref 0–200)
HDL: 50 mg/dL (ref >39)
LDL Cholesterol: 92 mg/dL (ref 0–99)
Total CHOL/HDL Ratio: 3.5 Ratio
Triglycerides: 174 mg/dL — ABNORMAL HIGH (ref <150)
VLDL: 35 mg/dL (ref 0–40)

## 2014-05-17 LAB — HEMOGLOBIN A1C
Hgb A1c MFr Bld: 5.9 % — ABNORMAL HIGH (ref <5.7)
MEAN PLASMA GLUCOSE: 123 mg/dL — AB (ref <117)

## 2014-05-17 LAB — TSH: TSH: 1.533 u[IU]/mL (ref 0.350–4.500)

## 2014-05-17 LAB — INSULIN, FASTING: Insulin fasting, serum: 18.6 u[IU]/mL (ref 2.0–19.6)

## 2014-05-17 LAB — MAGNESIUM: Magnesium: 2 mg/dL (ref 1.5–2.5)

## 2014-07-06 ENCOUNTER — Other Ambulatory Visit: Payer: Self-pay | Admitting: Internal Medicine

## 2014-07-26 ENCOUNTER — Other Ambulatory Visit: Payer: Self-pay | Admitting: Internal Medicine

## 2014-08-15 ENCOUNTER — Encounter: Payer: Self-pay | Admitting: Internal Medicine

## 2014-08-15 ENCOUNTER — Ambulatory Visit (INDEPENDENT_AMBULATORY_CARE_PROVIDER_SITE_OTHER): Payer: Medicare Other | Admitting: Internal Medicine

## 2014-08-15 VITALS — BP 120/74 | HR 72 | Temp 98.1°F | Resp 16 | Ht 65.0 in | Wt 188.0 lb

## 2014-08-15 DIAGNOSIS — E782 Mixed hyperlipidemia: Secondary | ICD-10-CM

## 2014-08-15 DIAGNOSIS — E669 Obesity, unspecified: Secondary | ICD-10-CM

## 2014-08-15 DIAGNOSIS — M797 Fibromyalgia: Secondary | ICD-10-CM

## 2014-08-15 DIAGNOSIS — Z9989 Dependence on other enabling machines and devices: Secondary | ICD-10-CM

## 2014-08-15 DIAGNOSIS — Z9181 History of falling: Secondary | ICD-10-CM

## 2014-08-15 DIAGNOSIS — Z1331 Encounter for screening for depression: Secondary | ICD-10-CM

## 2014-08-15 DIAGNOSIS — F329 Major depressive disorder, single episode, unspecified: Secondary | ICD-10-CM

## 2014-08-15 DIAGNOSIS — F32A Depression, unspecified: Secondary | ICD-10-CM

## 2014-08-15 DIAGNOSIS — I10 Essential (primary) hypertension: Secondary | ICD-10-CM

## 2014-08-15 DIAGNOSIS — E559 Vitamin D deficiency, unspecified: Secondary | ICD-10-CM

## 2014-08-15 DIAGNOSIS — R7303 Prediabetes: Secondary | ICD-10-CM

## 2014-08-15 DIAGNOSIS — G4733 Obstructive sleep apnea (adult) (pediatric): Secondary | ICD-10-CM

## 2014-08-15 DIAGNOSIS — Z79899 Other long term (current) drug therapy: Secondary | ICD-10-CM

## 2014-08-15 DIAGNOSIS — Z0000 Encounter for general adult medical examination without abnormal findings: Secondary | ICD-10-CM

## 2014-08-15 LAB — CBC WITH DIFFERENTIAL/PLATELET
Basophils Absolute: 0 10*3/uL (ref 0.0–0.1)
Basophils Relative: 0 % (ref 0–1)
Eosinophils Absolute: 0.2 10*3/uL (ref 0.0–0.7)
Eosinophils Relative: 3 % (ref 0–5)
HCT: 41.9 % (ref 36.0–46.0)
Hemoglobin: 14.1 g/dL (ref 12.0–15.0)
LYMPHS ABS: 2.1 10*3/uL (ref 0.7–4.0)
Lymphocytes Relative: 40 % (ref 12–46)
MCH: 30.2 pg (ref 26.0–34.0)
MCHC: 33.7 g/dL (ref 30.0–36.0)
MCV: 89.7 fL (ref 78.0–100.0)
MONOS PCT: 9 % (ref 3–12)
MPV: 10.2 fL (ref 8.6–12.4)
Monocytes Absolute: 0.5 10*3/uL (ref 0.1–1.0)
NEUTROS PCT: 48 % (ref 43–77)
Neutro Abs: 2.5 10*3/uL (ref 1.7–7.7)
Platelets: 323 10*3/uL (ref 150–400)
RBC: 4.67 MIL/uL (ref 3.87–5.11)
RDW: 14 % (ref 11.5–15.5)
WBC: 5.3 10*3/uL (ref 4.0–10.5)

## 2014-08-15 MED ORDER — MELOXICAM 15 MG PO TABS: 15.0000 mg | ORAL_TABLET | Freq: Every day | ORAL | Status: DC

## 2014-08-15 NOTE — Patient Instructions (Signed)

## 2014-08-15 NOTE — Progress Notes (Addendum)
Patient ID: [name], female   DOB: 1955-09-19, 59 y.o.   MRN: [id number]  MEDICARE ANNUAL WELLNESS VISIT AND OV  Assessment:   1. Essential hypertension  - TSH  2. Mixed hyperlipidemia  - Lipid panel  3. Obesity (BMI 30.5)  4. Prediabetes  - Hemoglobin A1c  - Insulin, fasting  5. Vitamin D deficiency  - Vit D  25 hydroxy   6. Medication management  - CBC with Differential/Platelet - BASIC METABOLIC PANEL WITH GFR - Hepatic function panel - Magnesium  7. OSA on CPAP   8. Fibromyalgia   9. Depression screen  - Screen Negative  10. At low risk for fall   11. Depression, controlled   12. Routine general medical examination at a health care facility   Plan:   During the course of the visit the patient was educated and counseled about appropriate screening and preventive services including:    Pneumococcal vaccine   Influenza vaccine  Td vaccine  Screening electrocardiogram  Bone densitometry screening  Colorectal cancer screening  Diabetes screening  Glaucoma screening  Nutrition counseling   Advanced directives: requested  Screening recommendations, referrals: Vaccinations:  Immunization History  Administered Date(s) Administered  . Influenza Split 04/06/2012, 04/05/2013, 05/16/2014  . Pneumococcal Conjugate-13 05/16/2014  . Pneumococcal-Unspecified 06/22/1998  . Td 06/22/2004   DT vaccine 05/16/2014 Shingles vaccine requested/declined Hep B vaccine not indicated  Nutrition assessed and recommended  Colonoscopy 11/2007  - Dr Ardis Hughs - recc 10 yr f/u Recommended yearly ophthalmology/optometry visit for glaucoma screening and checkup Recommended yearly dental visit for hygiene and checkup Advanced directives - no - has forms - never completed  Conditions/risks identified: BMI: Discussed weight loss, diet, and increase physical activity.  Increase physical activity: AHA recommends 150 minutes of physical activity a week.   Medications reviewed PreDiabetes is at goal, ACE/ARB therapy: not indicated Urinary Incontinence is not an issue: discussed non pharmacology and pharmacology options.  Fall risk: low- discussed PT, home fall assessment, medications.   Subjective:     [name] presents for [employer] Visit and OV.  Date of last medicare wellness visit was 05/16/2014. This very nice 59 y.o. MWF presents for 3 month follow up with Hypertension, Hyperlipidemia, Pre-Diabetes  and Vitamin D Deficiency. Patient also has long hx/o Major depression and remote conversion reaction simulating stroke & seizure which was r/o'd out during hospitalization by Neurology consultants and ultimately patient had psychiatric treatment started at that time . She continues to f/u with Dr Letta Moynahan, psychiatrist and patient's Depression  seems well compensated and stable on current psychotropic medications. Patient's long standing  Fibromyalgia is well controlled on current regimen with only infrequent use of her muscle relaxants or Tramadol.     Patient is treated for HTN expectantly since 2004 and was finally started on HCTZ in Jan 2014 for concomitant edema. BP has been controlled at home. Today's BP is 120/74. Patient has had no complaints of any cardiac type chest pain, palpitations, dyspnea/orthopnea/PND, dizziness, claudication, or dependent edema.    Hyperlipidemia is controlled with diet & meds. Patient denies myalgias or other med SE's. Last Lipids were at goal - Total  Cholesterol, 177; HDL  50; LDL  92; Trig 174 on 05/16/2014.    Also, the patient has history of Morbid Obesity (BMI 31.28) and consequent PreDiabetes since June 2011 with an A1c of 5.7% and insulin resistance with elevated insulin 37 (Nl<20). She denies symptoms of reactive hypoglycemia, diabetic polys, paresthesias or visual  blurring.  Last A1c was  Not at goal with A1c 5.9 % on 05/16/2014. Patient has had no success with attempts at  better diet control.     Further, the patient also has history of Vitamin D Deficiency of 22 in 2008 and supplements vitamin D without any suspected side-effects. Last vitamin D was  8 on  05/16/2014.  Names of Other Physician/Practitioners you currently use: 1. [facility] here for primary care 2. Dr Amil Amen, eye doctor, last visit 2015  3. Dr Quillian Quince, [place], dentist, last visit sch next week  Patient Care Team: Unk Pinto, MD as PCP - General (Internal Medicine) Alta Corning, MD as Consulting Physician (Orthopedic Surgery) Milus Banister, MD as Attending Physician (Gastroenterology) [name], MD as Consulting Physician (Psychiatry) [name], [place] (Optometry) [name], DDS as Referring Physician (Dentistry)  Medication Review: Medication Sig  . acetaminophen  500 MG tablet Take 500-1,000 mg by mouth every 6 (six) hours as needed for pain.  Marland Kitchen aspirin 81 MG tablet Take 81 mg by mouth daily.  Marland Kitchen VITAMIN D 2000 UNITS tablet Take 8,000 Units by mouth. Takes 8,000 units M,W,F only  . clonazePAM 1 MG tablet Take 1  at bedtime.  . CRESTOR 40 MG tablet TAKE 1 TAB DAILY  . cyclobenzaprine  10 MG tablet Take 1 tablet 3 times daily as needed for muscle spasms.  . Glucosamine-Chondroitin  Take 2 tablets by mouth daily.  . hydrochlorothiazide  25 MG  TAKE 1 TABLET BY MOUTH EVERY MORNING  . lamoTRIgine (LAMICTAL) 150 MG tablet Take 150 mg by mouth at bedtime.  . Magnesium 250 MG TABS Take 250 mg by mouth. Takes 5 pills per day  . MULTIVITAMIN WITH MINERALS  Take 0.5 tablets by mouth 2 (two) times daily.  Marland Kitchen FISH OIL 1000 MG CAPS Take by mouth daily.  . QUEtiapine (SEROQUEL) 100 MG tablet Take 100 mg by mouth 3 (three) times daily.  . traMADol  50 MG tablet Take 1 tablet every 6  hours as needed for fibromyalgia pain   Current Problems (verified) Patient Active Problem List   Diagnosis Date Noted  . Medication management  01/31/2014  . Fibromyalgia 01/31/2014  . Depression, controlled 01/31/2014  . Obesity (BMI 30.5) 01/31/2014  . Essential hypertension 08/09/2013  . Mixed hyperlipidemia 08/09/2013  . Prediabetes 08/09/2013  . Vitamin D deficiency 08/09/2013  . Osteoarthritis of left knee 04/17/2013  . Osteoarthritis of right knee 04/14/2013   Screening Tests Health Maintenance  Topic Date Due  . PAP SMEAR  01/02/1975  . TETANUS/TDAP  06/22/2014  . INFLUENZA VACCINE  01/21/2015  . MAMMOGRAM  05/16/2016  . COLONOSCOPY  12/11/2017  . HIV Screening  Completed   Immunization History  Administered Date(s) Administered  . Influenza Split 04/06/2012, 04/05/2013, 05/16/2014  . Pneumococcal Conjugate-13 05/16/2014  . Pneumococcal-Unspecified 06/22/1998  . Td 06/22/2004   Preventative care: Last colonoscopy: 11/2007 - 10 yr f/u - per Dr Ardis Hughs  History reviewed: allergies, current medications, past family history, past medical history, past social history, past surgical history and problem list  Risk Factors: Tobacco History  Substance Use Topics  . Smoking status: Never Smoker   . Smokeless tobacco: Not on file  . Alcohol Use: No   She does not smoke.  Patient is not a former smoker. Are there smokers in your home (other than you)?  No Alcohol Current alcohol use: none  Caffeine Current caffeine use: denies use  Exercise  Current exercise: uses exercise bike qod  Nutrition/Diet Current diet: in general, a "healthy" diet    Cardiac risk factors: none.  Depression Screen (Note: if answer to either of the following is "Yes", a more complete depression screening is indicated)   Q1: Over the past two weeks, have you felt down, depressed or hopeless? No  Q2: Over the past two weeks, have you felt little interest or pleasure in doing things? No  Have you lost interest or pleasure in daily life? No  Do you often feel hopeless? No  Do you cry easily over simple problems? No  Activities of  Daily Living In your present state of health, do you have any difficulty performing the following activities?:  Driving? No Managing money?  No Feeding yourself? No Getting from bed to chair? No Climbing a flight of stairs? No Preparing food and eating?: No Bathing or showering? No Getting dressed: No Getting to the toilet? No Using the toilet:No Moving around from place to place: No In the past year have you fallen or had a near fall?:No   Are you sexually active?  No  Do you have more than one partner?  No  Vision Difficulties: No  Hearing Difficulties: No Do you often ask people to speak up or repeat themselves? No Do you experience ringing or noises in your ears? No Do you have difficulty understanding soft or whispered voices? Sometimes.  Cognition  Do you feel that you have a problem with memory?No  Do you often misplace items? No  Do you feel safe at home?  Yes  Advanced directives Does patient have a [place]? Yes Does patient have a Living Will? Yes  Past Medical History  Diagnosis Date  . Hypertension   . Sleep apnea     cpap   > 4 yrs  last sleep study  . Arthritis   . Depression   . Fibromyalgia   . Vitamin D deficiency   . Elevated hemoglobin A1c   . Unspecified essential hypertension 08/09/2013  . Other abnormal glucose 08/09/2013  . Unspecified vitamin D deficiency 08/09/2013   Past Surgical History  Procedure Laterality Date  . Abdominal hysterectomy    . Joint replacement      arthroscopy  . Total knee arthroplasty Right 04/14/2013    Procedure: RIGHT TOTAL KNEE ARTHROPLASTY AND LEFT KNEE INJECTION ;  Surgeon: Alta Corning, MD;  Location: [facility];  Service: Orthopedics;  Laterality: Right;    ROS: Constitutional: Denies fever, chills, weight loss/gain, headaches, insomnia, fatigue, night sweats, and change in appetite. Eyes: Denies redness, blurred vision, diplopia, discharge, itchy, watery eyes.  ENT: Denies discharge,  congestion, post nasal drip, epistaxis, sore throat, earache, hearing loss, dental pain, Tinnitus, Vertigo, Sinus pain, snoring.  Cardio: Denies chest pain, palpitations, irregular heartbeat, syncope, dyspnea, diaphoresis, orthopnea, PND, claudication, edema Respiratory: denies cough, dyspnea, DOE, pleurisy, hoarseness, laryngitis, wheezing.  Gastrointestinal: Denies dysphagia, heartburn, reflux, water brash, pain, cramps, nausea, vomiting, bloating, diarrhea, constipation, hematemesis, melena, hematochezia, jaundice, hemorrhoids Genitourinary: Denies dysuria, frequency, urgency, nocturia, hesitancy, discharge, hematuria, flank pain Breast: Breast lumps, nipple discharge, bleeding.  Musculoskeletal: Denies arthralgia, myalgia, stiffness, Jt. Swelling, pain, limp, and strain/sprain. Denies falls. Skin: Denies puritis, rash, hives, warts, acne, eczema, changing in skin lesion Neuro: No weakness, tremor, incoordination, spasms, paresthesia, pain Psychiatric: Denies confusion, memory loss, sensory loss. Denies Depression. Endocrine: Denies change in weight, skin, hair change, nocturia, and paresthesia, diabetic polys, visual blurring, hyper / hypo glycemic episodes.  Heme/Lymph: No excessive bleeding, bruising, enlarged lymph nodes  Objective:     BP 120/74   Pulse 72  Temp 98.1 F  Resp 16  Ht 5\' 5"    Wt 188 lb     BMI 31.28   General Appearance: Well nourished, alert, WD/WN, femaleand in no apparent distress. Eyes: PERRLA, EOMs, conjunctiva no swelling or erythema, normal fundi and vessels. Sinuses: No frontal/maxillary tenderness ENT/Mouth: EACs patent / TMs  nl. Nares clear without erythema, swelling, mucoid exudates. Oral hygiene is good. No erythema, swelling, or exudate. Tongue normal, non-obstructing. Tonsils not swollen or erythematous. Hearing normal.  Neck: Supple, thyroid normal. No bruits, nodes or JVD. Respiratory: Respiratory effort normal.  BS equal and clear bilateral without  rales, rhonci, wheezing or stridor. Cardio: Heart sounds are normal with regular rate and rhythm and no murmurs, rubs or gallops. Peripheral pulses are normal and equal bilaterally without edema. No aortic or femoral bruits. Chest: symmetric with normal excursions and percussion. Breasts: Symmetric, without lumps, nipple discharge, retractions, or fibrocystic changes.  Abdomen: Flat, soft  with nl bowel sounds. Nontender, no guarding, rebound, hernias, masses, or organomegaly.  Lymphatics: Non tender without lymphadenopathy.  Genitourinary:  Musculoskeletal: Full ROM all peripheral extremities, joint stability, 5/5 strength, and normal gait. Skin: Warm and dry without rashes, lesions, cyanosis, clubbing or  ecchymosis.  Neuro: Cranial nerves intact, reflexes equal bilaterally. Normal muscle tone, no cerebellar symptoms. Sensation intact.  Pysch: Awake and oriented X 3, normal affect, Insight and Judgment appropriate.   Cognitive Testing  Alert? Yes  Normal Appearance?Yes  Oriented to person? Yes  Place? Yes   Time? Yes  Recall of three objects?  Yes  Can perform simple calculations? Yes  Displays appropriate judgment? Yes  Can read the correct time from a watch/clock?Yes  Medicare Attestation I have personally reviewed: The patient's medical and social history Their use of alcohol, tobacco or illicit drugs Their current medications and supplements The patient's functional ability including ADLs,fall risks, home safety risks, cognitive, and hearing and visual impairment Diet and physical activities Evidence for depression or mood disorders  The patient's weight, height, BMI, and visual acuity have been recorded in the chart.  I have made referrals, counseling, and provided education to the patient based on review of the above and I have provided the patient with a written personalized care plan for preventive services.    Lanita Stammen DAVID, MD   08/15/2014

## 2014-08-16 LAB — HEPATIC FUNCTION PANEL
ALK PHOS: 89 U/L (ref 39–117)
ALT: 32 U/L (ref 0–35)
AST: 25 U/L (ref 0–37)
Albumin: 4.5 g/dL (ref 3.5–5.2)
BILIRUBIN DIRECT: 0.1 mg/dL (ref 0.0–0.3)
BILIRUBIN TOTAL: 0.7 mg/dL (ref 0.2–1.2)
Indirect Bilirubin: 0.6 mg/dL (ref 0.2–1.2)
Total Protein: 7 g/dL (ref 6.0–8.3)

## 2014-08-16 LAB — BASIC METABOLIC PANEL WITH GFR
BUN: 19 mg/dL (ref 6–23)
CHLORIDE: 103 meq/L (ref 96–112)
CO2: 26 mEq/L (ref 19–32)
Calcium: 9.7 mg/dL (ref 8.4–10.5)
Creat: 0.67 mg/dL (ref 0.50–1.10)
GFR, Est African American: >89 mL/min
GFR, Est Non African American: >89 mL/min
Glucose, Bld: 95 mg/dL (ref 70–99)
Potassium: 4.1 mEq/L (ref 3.5–5.3)
Sodium: 142 mEq/L (ref 135–145)

## 2014-08-16 LAB — LIPID PANEL
CHOL/HDL RATIO: 3.3 ratio
Cholesterol: 170 mg/dL (ref 0–200)
HDL: 51 mg/dL (ref >=46)
LDL CALC: 91 mg/dL (ref 0–99)
Triglycerides: 140 mg/dL (ref <150)
VLDL: 28 mg/dL (ref 0–40)

## 2014-08-16 LAB — TSH: TSH: 1.335 u[IU]/mL (ref 0.350–4.500)

## 2014-08-16 LAB — INSULIN, FASTING: Insulin fasting, serum: 22 u[IU]/mL — ABNORMAL HIGH (ref 2.0–19.6)

## 2014-08-16 LAB — MAGNESIUM: Magnesium: 2 mg/dL (ref 1.5–2.5)

## 2014-08-16 LAB — HEMOGLOBIN A1C
HEMOGLOBIN A1C: 5.8 % — AB (ref <5.7)
Mean Plasma Glucose: 120 mg/dL — ABNORMAL HIGH (ref <117)

## 2014-08-16 LAB — VITAMIN D 25 HYDROXY (VIT D DEFICIENCY, FRACTURES): Vit D, 25-Hydroxy: 45 ng/mL (ref 30–100)

## 2014-10-06 ENCOUNTER — Other Ambulatory Visit: Payer: Self-pay | Admitting: Internal Medicine

## 2014-11-28 ENCOUNTER — Other Ambulatory Visit: Payer: Self-pay

## 2014-11-28 ENCOUNTER — Ambulatory Visit (INDEPENDENT_AMBULATORY_CARE_PROVIDER_SITE_OTHER): Payer: Medicare Other | Admitting: Physician Assistant

## 2014-11-28 ENCOUNTER — Encounter: Payer: Self-pay | Admitting: Physician Assistant

## 2014-11-28 VITALS — BP 110/78 | HR 76 | Temp 97.7°F | Resp 16 | Ht 65.0 in | Wt 188.0 lb

## 2014-11-28 DIAGNOSIS — Z79899 Other long term (current) drug therapy: Secondary | ICD-10-CM

## 2014-11-28 DIAGNOSIS — R7303 Prediabetes: Secondary | ICD-10-CM

## 2014-11-28 DIAGNOSIS — I10 Essential (primary) hypertension: Secondary | ICD-10-CM

## 2014-11-28 DIAGNOSIS — E559 Vitamin D deficiency, unspecified: Secondary | ICD-10-CM

## 2014-11-28 DIAGNOSIS — E669 Obesity, unspecified: Secondary | ICD-10-CM

## 2014-11-28 DIAGNOSIS — R7309 Other abnormal glucose: Secondary | ICD-10-CM

## 2014-11-28 DIAGNOSIS — E782 Mixed hyperlipidemia: Secondary | ICD-10-CM

## 2014-11-28 LAB — CBC WITH DIFFERENTIAL/PLATELET
Basophils Absolute: 0 10*3/uL (ref 0.0–0.1)
Basophils Relative: 1 % (ref 0–1)
EOS ABS: 0.1 10*3/uL (ref 0.0–0.7)
Eosinophils Relative: 3 % (ref 0–5)
HCT: 39.7 % (ref 36.0–46.0)
Hemoglobin: 13.5 g/dL (ref 12.0–15.0)
Lymphocytes Relative: 38 % (ref 12–46)
Lymphs Abs: 1.9 10*3/uL (ref 0.7–4.0)
MCH: 30.1 pg (ref 26.0–34.0)
MCHC: 34 g/dL (ref 30.0–36.0)
MCV: 88.4 fL (ref 78.0–100.0)
MONO ABS: 0.5 10*3/uL (ref 0.1–1.0)
MPV: 10 fL (ref 8.6–12.4)
Monocytes Relative: 10 % (ref 3–12)
Neutro Abs: 2.4 10*3/uL (ref 1.7–7.7)
Neutrophils Relative %: 48 % (ref 43–77)
Platelets: 307 10*3/uL (ref 150–400)
RBC: 4.49 MIL/uL (ref 3.87–5.11)
RDW: 14.2 % (ref 11.5–15.5)
WBC: 4.9 10*3/uL (ref 4.0–10.5)

## 2014-11-28 MED ORDER — ACCU-CHEK MULTICLIX LANCETS MISC: Status: DC

## 2014-11-28 NOTE — Patient Instructions (Signed)
Diabetes is a very complicated disease...lets simplify it.  An easy way to look at it to understand the complications is if you think of the extra sugar floating in your blood stream as glass shards floating through your blood stream.    Diabetes affects your small vessels first: 1) The glass shards (sugar) scraps down the tiny blood vessels in your eyes and lead to diabetic retinopathy, the leading cause of blindness in the [place]. Diabetes is the leading cause of newly diagnosed adult (20 to 58 years of age) blindness in the [place].  2) The glass shards scratches down the tiny vessels of your legs leading to nerve damage called neuropathy and can lead to amputations of your feet. More than 60% of all non-traumatic amputations of lower limbs occur in people with diabetes.  3) Over time the small vessels in your brain are shredded and closed off, individually this does not cause any problems but over a long period of time many of the small vessels being blocked can lead to Vascular Dementia.   4) Your kidney's are a filter system and have a "net" that keeps certain things in the body and lets bad things out. Sugar shreds this net and leads to kidney damage and eventually failure. Decreasing the sugar that is destroying the net and certain blood pressure medications can help stop or decrease progression of kidney disease. Diabetes was the primary cause of kidney failure in 44 percent of all new cases in 2011.  5) Diabetes also destroys the small vessels in your penis that lead to erectile dysfunction. Eventually the vessels are so damaged that you may not be responsive to cialis or viagra.   Diabetes and your large vessels: Your larger vessels consist of your coronary arteries in your heart and the carotid vessels to your brain. Diabetes or even increased sugars put you at 300% increased risk of heart attack and stroke and this is why.. The sugar scrapes down your large blood vessels and your body  sees this as an internal injury and tries to repair itself. Just like you get a scab on your skin, your platelets will stick to the blood vessel wall trying to heal it. This is why we have diabetics on low dose aspirin daily, this prevents the platelets from sticking and can prevent plaque formation. In addition, your body takes cholesterol and tries to shove it into the open wound. This is why we want your LDL, or bad cholesterol, below 70.   The combination of platelets and cholesterol over 5-10 years forms plaque that can break off and cause a heart attack or stroke.   PLEASE REMEMBER:  Diabetes is preventable! Up to 85 percent of complications and morbidities among individuals with type 2 diabetes can be prevented, delayed, or effectively treated and minimized with regular visits to a health professional, appropriate monitoring and medication, and a healthy diet and lifestyle.  Before you even begin to attack a weight-loss plan, it pays to remember this: You are not fat. You have fat. Losing weight isn't about blame or shame; it's simply another achievement to accomplish. Dieting is like any other skill-you have to buckle down and work at it. As long as you act in a smart, reasonable way, you'll ultimately get where you want to be. Here are some weight loss pearls for you.  1. It's Not a Diet. It's a Lifestyle Thinking of a diet as something you're on and suffering through only for the short term doesn't   work. To shed weight and keep it off, you need to make permanent changes to the way you eat. It's OK to indulge occasionally, of course, but if you cut calories temporarily and then revert to your old way of eating, you'll gain back the weight quicker than you can say yo-yo. Use it to lose it. Research shows that one of the best predictors of long-term weight loss is how many pounds you drop in the first month. For that reason, nutritionists often suggest being stricter for the first two weeks of your  new eating strategy to build momentum. Cut out added sugar and alcohol and avoid unrefined carbs. After that, figure out how you can reincorporate them in a way that's healthy and maintainable.  2. There's a Right Way to Exercise Working out burns calories and fat and boosts your metabolism by building muscle. But those trying to lose weight are notorious for overestimating the number of calories they burn and underestimating the amount they take in. Unfortunately, your system is biologically programmed to hold on to extra pounds and that means when you start exercising, your body senses the deficit and ramps up its hunger signals. If you're not diligent, you'll eat everything you burn and then some. Use it to lose it. Cardio gets all the exercise glory, but strength and interval training are the real heroes. They help you build lean muscle, which in turn increases your metabolism and calorie-burning ability 3. Don't Overreact to Mild Hunger Some people have a hard time losing weight because of hunger anxiety. To them, being hungry is bad-something to be avoided at all costs-so they carry snacks with them and eat when they don't need to. Others eat because they're stressed out or bored. While you never want to get to the point of being ravenous (that's when bingeing is likely to happen), a hunger pang, a craving, or the fact that it's 3:00 p.m. should not send you racing for the vending machine or obsessing about the energy bar in your purse. Ideally, you should put off eating until your stomach is growling and it's difficult to concentrate.  Use it to lose it. When you feel the urge to eat, use the HALT method. Ask yourself, Am I really hungry? Or am I angry or anxious, lonely or bored, or tired? If you're still not certain, try the apple test. If you're truly hungry, an apple should seem delicious; if it doesn't, something else is going on. Or you can try drinking water and making yourself busy, if you are  still hungry try a healthy snack.  4. Not All Calories Are Created Equal The mechanics of weight loss are pretty simple: Take in fewer calories than you use for energy. But the kind of food you eat makes all the difference. Processed food that's high in saturated fat and refined starch or sugar can cause inflammation that disrupts the hormone signals that tell your brain you're full. The result: You eat a lot more.  Use it to lose it. Clean up your diet. Swap in whole, unprocessed foods, including vegetables, lean protein, and healthy fats that will fill you up and give you the biggest nutritional bang for your calorie buck. In a few weeks, as your brain starts receiving regular hunger and fullness signals once again, you'll notice that you feel less hungry overall and naturally start cutting back on the amount you eat.  5. Protein, Produce, and Plant-Based Fats Are Your Weight-Loss Trinity Here's why eating the three   Ps regularly will help you drop pounds. Protein fills you up. You need it to build lean muscle, which keeps your metabolism humming so that you can torch more fat. People in a weight-loss program who ate double the recommended daily allowance for protein (about 110 grams for a 150-pound woman) lost 70 percent of their weight from fat, while people who ate the RDA lost only about 40 percent, one study found. Produce is packed with filling fiber. "It's very difficult to consume too many calories if you're eating a lot of vegetables. Example: Three cups of broccoli is a lot of food, yet only 93 calories. (Fruit is another story. It can be easy to overeat and can contain a lot of calories from sugar, so be sure to monitor your intake.) Plant-based fats like olive oil and those in avocados and nuts are healthy and extra satiating.  Use it to lose it. Aim to incorporate each of the three Ps into every meal and snack. People who eat protein throughout the day are able to keep weight off, according  to a study in the American Journal of Clinical Nutrition. In addition to meat, poultry and seafood, good sources are beans, lentils, eggs, tofu, and yogurt. As for fat, keep portion sizes in check by measuring out salad dressing, oil, and nut butters (shoot for one to two tablespoons). Finally, eat veggies or a little fruit at every meal. People who did that consumed 308 fewer calories but didn't feel any hungrier than when they didn't eat more produce.  7. How You Eat Is As Important As What You Eat In order for your brain to register that you're full, you need to focus on what you're eating. Sit down whenever you eat, preferably at a table. Turn off the TV or computer, put down your phone, and look at your food. Smell it. Chew slowly, and don't put another bite on your fork until you swallow. When women ate lunch this attentively, they consumed 30 percent less when snacking later than those who listened to an audiobook at lunchtime, according to a study in the [employer] Journal of Nutrition. 8. Weighing Yourself Really Works The scale provides the best evidence about whether your efforts are paying off. Seeing the numbers tick up or down or stagnate is motivation to keep going-or to rethink your approach. A 2015 study at [employer] found that daily weigh-ins helped people lose more weight, keep it off, and maintain that loss, even after two years. Use it to lose it. Step on the scale at the same time every day for the best results. If your weight shoots up several pounds from one weigh-in to the next, don't freak out. Eating a lot of salt the night before or having your period is the likely culprit. The number should return to normal in a day or two. It's a steady climb that you need to do something about. 9. Too Much Stress and Too Little Sleep Are Your Enemies When you're tired and frazzled, your body cranks up the production of cortisol, the stress hormone that can cause carb cravings. Not getting  enough sleep also boosts your levels of ghrelin, a hormone associated with hunger, while suppressing leptin, a hormone that signals fullness and satiety. People on a diet who slept only five and a half hours a night for two weeks lost 55 percent less fat and were hungrier than those who slept eight and a half hours, according to a study in the [employer]   [employer] Journal. Use it to lose it. Prioritize sleep, aiming for seven hours or more a night, which research shows helps lower stress. And make sure you're getting quality zzz's. If a snoring spouse or a fidgety cat wakes you up frequently throughout the night, you may end up getting the equivalent of just four hours of sleep, according to a study from [employer]. Keep pets out of the bedroom, and use a white-noise app to drown out snoring. 10. You Will Hit a plateau-And You Can Bust Through It As you slim down, your body releases much less leptin, the fullness hormone.  If you're not strength training, start right now. Building muscle can raise your metabolism to help you overcome a plateau. To keep your body challenged and burning calories, incorporate new moves and more intense intervals into your workouts or add another sweat session to your weekly routine. Alternatively, cut an extra 100 calories or so a day from your diet. Now that you've lost weight, your body simply doesn't need as much fuel.   Ways to cut 100 calories  1. Eat your eggs with hot sauce OR salsa instead of cheese.  Eggs are great for breakfast, but many people consider eggs and cheese to be BFFs. Instead of cheese-1 oz. of cheddar has 114 calories-top your eggs with hot sauce, which contains no calories and helps with satiety and metabolism. Salsa is also a great option!!  2. Top your toast, waffles or pancakes with mashed berries instead of jelly or syrup. Half a cup of berries-fresh, frozen or thawed-has about 40 calories, compared with 2 tbsp. of maple syrup or  jelly, which both have about 100 calories. The berries will also give you a good punch of fiber, which helps keep you full and satisfied and won't spike blood sugar quickly like the jelly or syrup. 3. Swap the non-fat latte for black coffee with a splash of half-and-half. Contrary to its name, that non-fat latte has 130 calories and a startling 19g of carbohydrates per 16 oz. serving. Replacing that 'light' drinkable dessert with a black coffee with a splash of half-and-half saves you more than 100 calories per 16 oz. serving. 4. Sprinkle salads with freeze-dried raspberries instead of dried cranberries. If you want a sweet addition to your nutritious salad, stay away from dried cranberries. They have a whopping 130 calories per  cup and 30g carbohydrates. Instead, sprinkle freeze-dried raspberries guilt-free and save more than 100 calories per  cup serving, adding 3g of belly-filling fiber. 5. Go for mustard in place of mayo on your sandwich. Mustard can add really nice flavor to any sandwich, and there are tons of varieties, from spicy to honey. A serving of mayo is 95 calories, versus 10 calories in a serving of mustard. 6. Choose a DIY salad dressing instead of the store-bought kind. Mix Dijon or whole grain mustard with low-fat Kefir or red wine vinegar and garlic. 7. Use hummus as a spread instead of a dip. Use hummus as a spread on a high-fiber cracker or tortilla with a sandwich and save on calories without sacrificing taste. 8. Pick just one salad "accessory." Salad isn't automatically a calorie winner. It's easy to over-accessorize with toppings. Instead of topping your salad with nuts, avocado and cranberries (all three will clock in at 313 calories), just pick one. The next day, choose a different accessory, which will also keep your salad interesting. You don't wear all your jewelry every day, right? 9. Ditch the white pasta   in favor of spaghetti squash. One cup of cooked spaghetti  squash has about 40 calories, compared with traditional spaghetti, which comes with more than 200. Spaghetti squash is also nutrient-dense. It's a good source of fiber and Vitamins A and C, and it can be eaten just like you would eat pasta-with a great tomato sauce and turkey meatballs or with pesto, tofu and spinach, for example. 10. Dress up your chili, soups and stews with non-fat Greek yogurt instead of sour cream. Just a 'dollop' of sour cream can set you back 115 calories and a whopping 12g of fat-seven of which are of the artery-clogging variety. Added bonus: Greek yogurt is packed with muscle-building protein, calcium and B Vitamins. 11. Mash cauliflower instead of mashed potatoes. One cup of traditional mashed potatoes-in all their creamy goodness-has more than 200 calories, compared to mashed cauliflower, which you can typically eat for less than 100 calories per 1 cup serving. Cauliflower is a great source of the antioxidant indole-3-carbinol (I3C), which may help reduce the risk of some cancers, like breast cancer. 12. Ditch the ice cream sundae in favor of a Greek yogurt parfait. Instead of a cup of ice cream or fro-yo for dessert, try 1 cup of nonfat Greek yogurt topped with fresh berries and a sprinkle of cacao nibs. Both toppings are packed with antioxidants, which can help reduce cellular inflammation and oxidative damage. And the comparison is a no-brainer: One cup of ice cream has about 275 calories; one cup of frozen yogurt has about 230; and a cup of Greek yogurt has just 130, plus twice the protein, so you're less likely to return to the freezer for a second helping. 13. Put olive oil in a spray container instead of using it directly from the bottle. Each tablespoon of olive oil is 120 calories and 15g of fat. Use a mister instead of pouring it straight into the pan or onto a salad. This allows for portion control and will save you more than 100 calories. 14. When baking, substitute  canned pumpkin for butter or oil. Canned pumpkin-not pumpkin pie mix-is loaded with Vitamin A, which is important for skin and eye health, as well as immunity. And the comparisons are pretty crazy:  cup of canned pumpkin has about 40 calories, compared to butter or oil, which has more than 800 calories. Yes, 800 calories. Applesauce and mashed banana can also serve as good substitutions for butter or oil, usually in a 1:1 ratio. 15. Top casseroles with high-fiber cereal instead of breadcrumbs. Breadcrumbs are typically made with white bread, while breakfast cereals contain 5-9g of fiber per serving. Not only will you save more than 150 calories per  cup serving, the swap will also keep you more full and you'll get a metabolism boost from the added fiber. 16. Snack on pistachios instead of macadamia nuts. Believe it or not, you get the same amount of calories from 35 pistachios (100 calories) as you would from only five macadamia nuts. 17. Chow down on kale chips rather than potato chips. This is my favorite 'don't knock it 'till you try it' swap. Kale chips are so easy to make at home, and you can spice them up with a little grated parmesan or chili powder. Plus, they're a mere fraction of the calories of potato chips, but with the same crunch factor we crave so often. 18. Add seltzer and some fruit slices to your cocktail instead of soda or fruit juice. One cup of soda or fruit   juice can pack on as much as 140 calories. Instead, use seltzer and fruit slices. The fruit provides valuable phytochemicals, such as flavonoids and anthocyanins, which help to combat cancer and stave off the aging process.  

## 2014-11-28 NOTE — Progress Notes (Signed)
Assessment and Plan:  1. Hypertension -Continue medication, monitor blood pressure at home. Continue DASH diet.  Reminder to go to the ER if any CP, SOB, nausea, dizziness, severe HA, changes vision/speech, left arm numbness and tingling and jaw pain.  2. Cholesterol -Continue diet and exercise. Check cholesterol.   3. Prediabetes  -Continue diet and exercise. Check A1C  4. Vitamin D Def - check level and continue medications.   5. Obesity with co morbidities - long discussion about weight loss, diet, and exercise   Continue diet and meds as discussed. Further disposition pending results of labs. Over 30 minutes of exam, counseling, chart review, and critical decision making was performed  HPI 59 y.o. female  presents for 3 month follow up on hypertension, cholesterol, prediabetes, and vitamin D deficiency.   Her blood pressure has been controlled at home, today their BP is BP: 110/78 mmHg  She does not workout but states she does not sit down. She denies chest pain, shortness of breath, dizziness.  She is on cholesterol medication, crestor 40mg  1/2 tablet Monday, Wednesday and Friday and denies myalgias. Her cholesterol is at goal. The cholesterol last visit was:   Lab Results  Component Value Date   CHOL 170 08/15/2014   HDL 51 08/15/2014   LDLCALC 91 08/15/2014   TRIG 140 08/15/2014   CHOLHDL 3.3 08/15/2014    She has been working on diet and exercise for prediabetes, and denies paresthesia of the feet, polydipsia, polyuria and visual disturbances. Last A1C in the office was:  Lab Results  Component Value Date   HGBA1C 5.8* 08/15/2014   Patient is on Vitamin D supplement.   Lab Results  Component Value Date   VD25OH 45 08/15/2014     BMI is Body mass index is 31.28 kg/(m^2)., she is working on diet and exercise. She is on CPAP for OSA.  Wt Readings from Last 3 Encounters:  11/28/14 188 lb (85.276 kg)  08/15/14 188 lb (85.276 kg)  05/16/14 186 lb (84.369 kg)     Current Medications:  Current Outpatient Prescriptions on File Prior to Visit  Medication Sig Dispense Refill  . ACCU-CHEK AVIVA PLUS test strip USE AS DIRECTED DAILY 100 each 99  . acetaminophen (TYLENOL) 500 MG tablet Take 500-1,000 mg by mouth every 6 (six) hours as needed for pain.    . Albuterol Sulfate (PROAIR HFA IN) Inhale into the lungs. Uses PRN    . aspirin 81 MG tablet Take 81 mg by mouth daily.    . benzonatate (TESSALON) 100 MG capsule Take by mouth 3 (three) times daily as needed for cough.    . Cetirizine HCl 10 MG CAPS Take 1 capsule by mouth daily.    . Cholecalciferol (VITAMIN D) 2000 UNITS tablet Take 6,000 Units by mouth. Takes 8,000 units M,W,F only    . clonazePAM (KLONOPIN) 1 MG tablet Take 1 mg by mouth at bedtime.    . CRESTOR 40 MG tablet TAKE 1 TABLET BY MOUTH DAILY 30 tablet 3  . cyclobenzaprine (FLEXERIL) 10 MG tablet Take 1 tablet (10 mg total) by mouth 3 (three) times daily as needed for muscle spasms. 30 tablet 0  . Glucosamine-Chondroitin (MOVE FREE PO) Take 2 tablets by mouth daily.    . hydrochlorothiazide (HYDRODIURIL) 25 MG tablet TAKE 1 TABLET BY MOUTH EVERY MORNING FOR BLOOD PRESSURE AND FLUID 90 tablet 1  . Ibuprofen 200 MG CAPS Take 1 capsule by mouth. PRN    . lamoTRIgine (LAMICTAL) 150 MG  tablet Take 150 mg by mouth at bedtime.    . Magnesium 250 MG TABS Take 250 mg by mouth. Takes 5 pills per day    . meloxicam (MOBIC) 15 MG tablet Take 1 tablet (15 mg total) by mouth daily. 90 tablet 99  . Multiple Vitamin (MULTIVITAMIN WITH MINERALS) TABS tablet Take 0.5 tablets by mouth 2 (two) times daily.    . naproxen sodium (ANAPROX) 220 MG tablet Take 220 mg by mouth 2 (two) times daily with a meal. PRN    . Omega-3 Fatty Acids (FISH OIL) 1000 MG CAPS Take by mouth daily.    . QUEtiapine (SEROQUEL) 100 MG tablet Take 100 mg by mouth 3 (three) times daily.    . traMADol (ULTRAM) 50 MG tablet Take 1 tablet (50 mg total) by mouth every 6 (six) hours as  needed. 30 tablet 0   No current facility-administered medications on file prior to visit.   Medical History:  Past Medical History  Diagnosis Date  . Hypertension   . Sleep apnea     cpap   > 4 yrs  last sleep study  . Arthritis   . Depression   . Fibromyalgia   . Vitamin D deficiency   . Elevated hemoglobin A1c   . Unspecified essential hypertension 08/09/2013  . Other abnormal glucose 08/09/2013  . Unspecified vitamin D deficiency 08/09/2013   Allergies:  Allergies  Allergen Reactions  . Latex     Band aids= if left on for extended period of time  . Celecoxib     REACTION: Rash (celebrex)  . Cephalexin     REACTION: Rash (keflex)  . Codeine     REACTION: Rash  . Erythromycin     REACTION: Rash (emycin)  . Sulfonamide Derivatives     REACTION: Rash  . Trazodone And Nefazodone     insomnia  . 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   HENT: Negative.   Eyes: Negative.   Respiratory: Negative.   Cardiovascular: Negative.   Gastrointestinal: Negative.   Genitourinary: Negative.   Musculoskeletal: Negative.   Skin: Negative.   Neurological: Negative.   Endo/Heme/Allergies: Negative.   Psychiatric/Behavioral: Negative.     Family history- Review and unchanged Social history- Review and unchanged Physical Exam: BP 110/78 mmHg  Pulse 76  Temp(Src) 97.7 F (36.5 C)  Resp 16  Ht 5\' 5"  (1.651 m)  Wt 188 lb (85.276 kg)  BMI 31.28 kg/m2 Wt Readings from Last 3 Encounters:  11/28/14 188 lb (85.276 kg)  08/15/14 188 lb (85.276 kg)  05/16/14 186 lb (84.369 kg)   General Appearance: Well nourished, in no apparent distress. Eyes: PERRLA, EOMs, conjunctiva no swelling or erythema Sinuses: No Frontal/maxillary tenderness ENT/Mouth: Ext aud canals clear, TMs without erythema, bulging. No erythema, swelling, or exudate on post pharynx.  Tonsils not swollen or erythematous. Hearing  normal.  Neck: Supple, thyroid normal.  Respiratory: Respiratory effort normal, BS equal bilaterally without rales, rhonchi, wheezing or stridor.  Cardio: RRR with no MRGs. Brisk peripheral pulses without edema.  Abdomen: Soft, + BS,  Non tender, no guarding, rebound, hernias, masses. Lymphatics: Non tender without lymphadenopathy.  Musculoskeletal: Full ROM, 5/5 strength, Normal gait Skin: Warm, dry without rashes, lesions, ecchymosis.  Neuro: Cranial nerves intact. Normal muscle tone, no cerebellar symptoms. Psych: Awake and oriented X 3, normal affect, Insight and Judgment appropriate.    Vicie Mutters, PA-C  9:65 AM [facility]

## 2014-11-29 LAB — INSULIN, FASTING: Insulin fasting, serum: 25.2 u[IU]/mL — ABNORMAL HIGH (ref 2.0–19.6)

## 2014-11-29 LAB — HEMOGLOBIN A1C
HEMOGLOBIN A1C: 5.8 % — AB (ref <5.7)
Mean Plasma Glucose: 120 mg/dL — ABNORMAL HIGH (ref <117)

## 2014-11-29 LAB — TSH: TSH: 1.21 u[IU]/mL (ref 0.350–4.500)

## 2014-11-30 ENCOUNTER — Encounter (INDEPENDENT_AMBULATORY_CARE_PROVIDER_SITE_OTHER): Payer: Self-pay

## 2014-11-30 LAB — BASIC METABOLIC PANEL WITH GFR
BUN: 16 mg/dL (ref 6–23)
CO2: 27 mEq/L (ref 19–32)
Calcium: 9.3 mg/dL (ref 8.4–10.5)
Chloride: 104 mEq/L (ref 96–112)
Creat: 0.64 mg/dL (ref 0.50–1.10)
GFR, Est African American: >89 mL/min
Glucose, Bld: 96 mg/dL (ref 70–99)
POTASSIUM: 3.8 meq/L (ref 3.5–5.3)
Sodium: 140 mEq/L (ref 135–145)

## 2014-11-30 LAB — LIPID PANEL
Cholesterol: 147 mg/dL (ref 0–200)
HDL: 48 mg/dL (ref >=46)
LDL CALC: 75 mg/dL (ref 0–99)
Total CHOL/HDL Ratio: 3.1 Ratio
Triglycerides: 118 mg/dL (ref <150)
VLDL: 24 mg/dL (ref 0–40)

## 2014-11-30 LAB — HEPATIC FUNCTION PANEL
ALT: 38 U/L — ABNORMAL HIGH (ref 0–35)
AST: 27 U/L (ref 0–37)
Albumin: 4.4 g/dL (ref 3.5–5.2)
Alkaline Phosphatase: 85 U/L (ref 39–117)
BILIRUBIN DIRECT: 0.1 mg/dL (ref 0.0–0.3)
BILIRUBIN TOTAL: 0.5 mg/dL (ref 0.2–1.2)
Indirect Bilirubin: 0.4 mg/dL (ref 0.2–1.2)
Total Protein: 7.1 g/dL (ref 6.0–8.3)

## 2014-11-30 LAB — VITAMIN D 25 HYDROXY (VIT D DEFICIENCY, FRACTURES): Vit D, 25-Hydroxy: 60 ng/mL (ref 30–100)

## 2014-11-30 LAB — MAGNESIUM: Magnesium: 2.1 mg/dL (ref 1.5–2.5)

## 2015-03-20 ENCOUNTER — Ambulatory Visit (INDEPENDENT_AMBULATORY_CARE_PROVIDER_SITE_OTHER): Payer: Medicare Other | Admitting: Internal Medicine

## 2015-03-20 ENCOUNTER — Encounter: Payer: Self-pay | Admitting: Internal Medicine

## 2015-03-20 VITALS — BP 140/82 | HR 76 | Temp 97.7°F | Resp 18 | Ht 65.0 in | Wt 183.0 lb

## 2015-03-20 DIAGNOSIS — Z789 Other specified health status: Secondary | ICD-10-CM

## 2015-03-20 DIAGNOSIS — Z23 Encounter for immunization: Secondary | ICD-10-CM

## 2015-03-20 DIAGNOSIS — R7309 Other abnormal glucose: Secondary | ICD-10-CM | POA: Diagnosis not present

## 2015-03-20 DIAGNOSIS — I10 Essential (primary) hypertension: Secondary | ICD-10-CM | POA: Diagnosis not present

## 2015-03-20 DIAGNOSIS — E669 Obesity, unspecified: Secondary | ICD-10-CM | POA: Diagnosis not present

## 2015-03-20 DIAGNOSIS — Z6830 Body mass index (BMI) 30.0-30.9, adult: Secondary | ICD-10-CM | POA: Diagnosis not present

## 2015-03-20 DIAGNOSIS — E782 Mixed hyperlipidemia: Secondary | ICD-10-CM

## 2015-03-20 DIAGNOSIS — Z1389 Encounter for screening for other disorder: Secondary | ICD-10-CM | POA: Diagnosis not present

## 2015-03-20 DIAGNOSIS — M797 Fibromyalgia: Secondary | ICD-10-CM

## 2015-03-20 DIAGNOSIS — F32A Depression, unspecified: Secondary | ICD-10-CM

## 2015-03-20 DIAGNOSIS — E559 Vitamin D deficiency, unspecified: Secondary | ICD-10-CM | POA: Diagnosis not present

## 2015-03-20 DIAGNOSIS — Z9181 History of falling: Secondary | ICD-10-CM

## 2015-03-20 DIAGNOSIS — Z79899 Other long term (current) drug therapy: Secondary | ICD-10-CM | POA: Diagnosis not present

## 2015-03-20 DIAGNOSIS — F329 Major depressive disorder, single episode, unspecified: Secondary | ICD-10-CM | POA: Diagnosis not present

## 2015-03-20 DIAGNOSIS — M170 Bilateral primary osteoarthritis of knee: Secondary | ICD-10-CM

## 2015-03-20 DIAGNOSIS — Z1212 Encounter for screening for malignant neoplasm of rectum: Secondary | ICD-10-CM

## 2015-03-20 DIAGNOSIS — Z0000 Encounter for general adult medical examination without abnormal findings: Secondary | ICD-10-CM | POA: Insufficient documentation

## 2015-03-20 DIAGNOSIS — R7303 Prediabetes: Secondary | ICD-10-CM

## 2015-03-20 DIAGNOSIS — G4733 Obstructive sleep apnea (adult) (pediatric): Secondary | ICD-10-CM | POA: Diagnosis not present

## 2015-03-20 DIAGNOSIS — Z1331 Encounter for screening for depression: Secondary | ICD-10-CM

## 2015-03-20 DIAGNOSIS — Z9989 Dependence on other enabling machines and devices: Secondary | ICD-10-CM

## 2015-03-20 LAB — CBC WITH DIFFERENTIAL/PLATELET
BASOS ABS: 0 10*3/uL (ref 0.0–0.1)
Basophils Relative: 0 % (ref 0–1)
EOS ABS: 0.3 10*3/uL (ref 0.0–0.7)
Eosinophils Relative: 4 % (ref 0–5)
HEMATOCRIT: 39.6 % (ref 36.0–46.0)
HEMOGLOBIN: 13.2 g/dL (ref 12.0–15.0)
LYMPHS ABS: 2.3 10*3/uL (ref 0.7–4.0)
LYMPHS PCT: 35 % (ref 12–46)
MCH: 30 pg (ref 26.0–34.0)
MCHC: 33.3 g/dL (ref 30.0–36.0)
MCV: 90 fL (ref 78.0–100.0)
MONOS PCT: 9 % (ref 3–12)
MPV: 10.2 fL (ref 8.6–12.4)
Monocytes Absolute: 0.6 10*3/uL (ref 0.1–1.0)
NEUTROS ABS: 3.4 10*3/uL (ref 1.7–7.7)
NEUTROS PCT: 52 % (ref 43–77)
PLATELETS: 371 10*3/uL (ref 150–400)
RBC: 4.4 MIL/uL (ref 3.87–5.11)
RDW: 13.9 % (ref 11.5–15.5)
WBC: 6.5 10*3/uL (ref 4.0–10.5)

## 2015-03-20 NOTE — Patient Instructions (Signed)
Recommend Adult Low dose Aspirin or   coated  Aspirin 81 mg daily   To reduce risk of Colon Cancer 20 %,   Skin Cancer 26 % ,   Melanoma 46%   and   Pancreatic cancer 60%  ++++++++++++++++++  Vitamin D goal   is between 70-100.   Please make sure that you are taking your Vitamin D as directed.   It is very important as a natural anti-inflammatory   helping hair, skin, and nails, as well as reducing stroke and heart attack risk.   It helps your bones and helps with mood.  It also decreases numerous cancer risks so please take it as directed.   Low Vit D is associated with a 200-300% higher risk for CANCER   and 200-300% higher risk for HEART   ATTACK  &  STROKE.   ......................................  It is also associated with higher death rate at younger ages,   autoimmune diseases like Rheumatoid arthritis, Lupus, Multiple Sclerosis.     Also many other serious conditions, like depression, Alzheimer's  Dementia, infertility, muscle aches, fatigue, fibromyalgia - just to name a few.  +++++++++++++++++++  Recommend the book "The END of DIETING" by Dr Joel Fuhrman   & the book "The END of DIABETES " by Dr Joel Fuhrman  At [employer] - get book & Audio CD's     Being diabetic has a  300% increased risk for heart attack, stroke, cancer, and alzheimer- type vascular dementia. It is very important that you work harder with diet by avoiding all foods that are white. Avoid white rice (brown & wild rice is OK), white potatoes (sweetpotatoes in moderation is OK), White bread or wheat bread or anything made out of white flour like bagels, donuts, rolls, buns, biscuits, cakes, pastries, cookies, pizza crust, and pasta (made from white flour & egg whites) - vegetarian pasta or spinach or wheat pasta is OK. Multigrain breads like Arnold's or [employer], or multigrain sandwich thins or flatbreads.  Diet, exercise and weight loss can reverse and cure diabetes in the early  stages.  Diet, exercise and weight loss is very important in the control and prevention of complications of diabetes which affects every system in your body, ie. Brain - dementia/stroke, eyes - glaucoma/blindness, heart - heart attack/heart failure, kidneys - dialysis, stomach - gastric paralysis, intestines - malabsorption, nerves - severe painful neuritis, circulation - gangrene & loss of a leg(s), and finally cancer and Alzheimers.    I recommend avoid fried & greasy foods,  sweets/candy, white rice (brown or wild rice or Quinoa is OK), white potatoes (sweet potatoes are OK) - anything made from white flour - bagels, doughnuts, rolls, buns, biscuits,white and wheat breads, pizza crust and traditional pasta made of white flour & egg white(vegetarian pasta or spinach or wheat pasta is OK).  Multi-grain bread is OK - like multi-grain flat bread or sandwich thins. Avoid alcohol in excess. Exercise is also important.    Eat all the vegetables you want - avoid meat, especially red meat and dairy - especially cheese.  Cheese is the most concentrated form of trans-fats which is the worst thing to clog up our arteries. Veggie cheese is OK which can be found in the fresh produce section at [employer] or [employer] or [employer]  ++++++++++++++++++++++++++   Preventive Care for Adults  A healthy lifestyle and preventive care can promote health and wellness. Preventive health guidelines for women include the following key practices.  A routine   yearly physical is a good way to check with your health care provider about your health and preventive screening. It is a chance to share any concerns and updates on your health and to receive a thorough exam.  Visit your dentist for a routine exam and preventive care every 6 months. Brush your teeth twice a day and floss once a day. Good oral hygiene prevents tooth decay and gum disease.  The frequency of eye exams is based on your age, health, family medical  history, use of contact lenses, and other factors. Follow your health care provider's recommendations for frequency of eye exams.  Eat a healthy diet. Foods like vegetables, fruits, whole grains, low-fat dairy products, and lean protein foods contain the nutrients you need without too many calories. Decrease your intake of foods high in solid fats, added sugars, and salt. Eat the right amount of calories for you.Get information about a proper diet from your health care provider, if necessary.  Regular physical exercise is one of the most important things you can do for your health. Most adults should get at least 150 minutes of moderate-intensity exercise (any activity that increases your heart rate and causes you to sweat) each week. In addition, most adults need muscle-strengthening exercises on 2 or more days a week.  Maintain a healthy weight. The body mass index (BMI) is a screening tool to identify possible weight problems. It provides an estimate of body fat based on height and weight. Your health care provider can find your BMI and can help you achieve or maintain a healthy weight.For adults 20 years and older:  A BMI below 18.5 is considered underweight.  A BMI of 18.5 to 24.9 is normal.  A BMI of 25 to 29.9 is considered overweight.  A BMI of 30 and above is considered obese.  Maintain normal blood lipids and cholesterol levels by exercising and minimizing your intake of saturated fat. Eat a balanced diet with plenty of fruit and vegetables. Blood tests for lipids and cholesterol should begin at age 44 and be repeated every 5 years. If your lipid or cholesterol levels are high, you are over 50, or you are at high risk for heart disease, you may need your cholesterol levels checked more frequently.Ongoing high lipid and cholesterol levels should be treated with medicines if diet and exercise are not working.  If you smoke, find out from your health care provider how to quit. If you do  not use tobacco, do not start.  Lung cancer screening is recommended for adults aged 63-80 years who are at high risk for developing lung cancer because of a history of smoking. A yearly low-dose CT scan of the lungs is recommended for people who have at least a 30-pack-year history of smoking and are a current smoker or have quit within the past 15 years. A pack year of smoking is smoking an average of 1 pack of cigarettes a day for 1 year (for example: 1 pack a day for 30 years or 2 packs a day for 15 years). Yearly screening should continue until the smoker has stopped smoking for at least 15 years. Yearly screening should be stopped for people who develop a health problem that would prevent them from having lung cancer treatment.  High blood pressure causes heart disease and increases the risk of stroke. Your blood pressure should be checked at least every 1 to 2 years. Ongoing high blood pressure should be treated with medicines if weight loss and exercise  do not work.  If you are 34-80 years old, ask your health care provider if you should take aspirin to prevent strokes.  Diabetes screening involves taking a blood sample to check your fasting blood sugar level. This should be done once every 3 years, after age 24, if you are within normal weight and without risk factors for diabetes. Testing should be considered at a younger age or be carried out more frequently if you are overweight and have at least 1 risk factor for diabetes.  Breast cancer screening is essential preventive care for women. You should practice "breast self-awareness." This means understanding the normal appearance and feel of your breasts and may include breast self-examination. Any changes detected, no matter how small, should be reported to a health care provider. Women in their 62s and 30s should have a clinical breast exam (CBE) by a health care provider as part of a regular health exam every 1 to 3 years. After age 1, women  should have a CBE every year. Starting at age 24, women should consider having a mammogram (breast X-ray test) every year. Women who have a family history of breast cancer should talk to their health care provider about genetic screening. Women at a high risk of breast cancer should talk to their health care providers about having an MRI and a mammogram every year.  Breast cancer gene (BRCA)-related cancer risk assessment is recommended for women who have family members with BRCA-related cancers. BRCA-related cancers include breast, ovarian, tubal, and peritoneal cancers. Having family members with these cancers may be associated with an increased risk for harmful changes (mutations) in the breast cancer genes BRCA1 and BRCA2. Results of the assessment will determine the need for genetic counseling and BRCA1 and BRCA2 testing.  Routine pelvic exams to screen for cancer are no longer recommended for nonpregnant women who are considered low risk for cancer of the pelvic organs (ovaries, uterus, and vagina) and who do not have symptoms. Ask your health care provider if a screening pelvic exam is right for you.  If you have had past treatment for cervical cancer or a condition that could lead to cancer, you need Pap tests and screening for cancer for at least 20 years after your treatment. If Pap tests have been discontinued, your risk factors (such as having a new sexual partner) need to be reassessed to determine if screening should be resumed. Some women have medical problems that increase the chance of getting cervical cancer. In these cases, your health care provider may recommend more frequent screening and Pap tests.  Colorectal cancer can be detected and often prevented. Most routine colorectal cancer screening begins at the age of 9 years and continues through age 58 years. However, your health care provider may recommend screening at an earlier age if you have risk factors for colon cancer. On a  yearly basis, your health care provider may provide home test kits to check for hidden blood in the stool. Use of a small camera at the end of a tube, to directly examine the colon (sigmoidoscopy or colonoscopy), can detect the earliest forms of colorectal cancer. Talk to your health care provider about this at age 44, when routine screening begins. Direct exam of the colon should be repeated every 5-10 years through age 57 years, unless early forms of pre-cancerous polyps or small growths are found.  Hepatitis C blood testing is recommended for all people born from [birth year] through 1965 and any individual with known risks for  hepatitis C.  Pra  Osteoporosis is a disease in which the bones lose minerals and strength with aging. This can result in serious bone fractures or breaks. The risk of osteoporosis can be identified using a bone density scan. Women ages 21 years and over and women at risk for fractures or osteoporosis should discuss screening with their health care providers. Ask your health care provider whether you should take a calcium supplement or vitamin D to reduce the rate of osteoporosis.  Menopause can be associated with physical symptoms and risks. Hormone replacement therapy is available to decrease symptoms and risks. You should talk to your health care provider about whether hormone replacement therapy is right for you.  Use sunscreen. Apply sunscreen liberally and repeatedly throughout the day. You should seek shade when your shadow is shorter than you. Protect yourself by wearing long sleeves, pants, a wide-brimmed hat, and sunglasses year round, whenever you are outdoors.  Once a month, do a whole body skin exam, using a mirror to look at the skin on your back. Tell your health care provider of new moles, moles that have irregular borders, moles that are larger than a pencil eraser, or moles that have changed in shape or color.  Stay current with required vaccines  (immunizations).  Influenza vaccine. All adults should be immunized every year.  Tetanus, diphtheria, and acellular pertussis (Td, Tdap) vaccine. Pregnant women should receive 1 dose of Tdap vaccine during each pregnancy. The dose should be obtained regardless of the length of time since the last dose. Immunization is preferred during the 27th-36th week of gestation. An adult who has not previously received Tdap or who does not know her vaccine status should receive 1 dose of Tdap. This initial dose should be followed by tetanus and diphtheria toxoids (Td) booster doses every 10 years. Adults with an unknown or incomplete history of completing a 3-dose immunization series with Td-containing vaccines should begin or complete a primary immunization series including a Tdap dose. Adults should receive a Td booster every 10 years.  Varicella vaccine. An adult without evidence of immunity to varicella should receive 2 doses or a second dose if she has previously received 1 dose. Pregnant females who do not have evidence of immunity should receive the first dose after pregnancy. This first dose should be obtained before leaving the health care facility. The second dose should be obtained 4-8 weeks after the first dose.  Human papillomavirus (HPV) vaccine. Females aged 13-26 years who have not received the vaccine previously should obtain the 3-dose series. The vaccine is not recommended for use in pregnant females. However, pregnancy testing is not needed before receiving a dose. If a female is found to be pregnant after receiving a dose, no treatment is needed. In that case, the remaining doses should be delayed until after the pregnancy. Immunization is recommended for any person with an immunocompromised condition through the age of 81 years if she did not get any or all doses earlier. During the 3-dose series, the second dose should be obtained 4-8 weeks after the first dose. The third dose should be obtained  24 weeks after the first dose and 16 weeks after the second dose.  Zoster vaccine. One dose is recommended for adults aged 64 years or older unless certain conditions are present.  Measles, mumps, and rubella (MMR) vaccine. Adults born before [birth year] generally are considered immune to measles and mumps. Adults born in [birth year] or later should have 1 or more doses of MMR  vaccine unless there is a contraindication to the vaccine or there is laboratory evidence of immunity to each of the three diseases. A routine second dose of MMR vaccine should be obtained at least 28 days after the first dose for students attending postsecondary schools, health care workers, or international travelers. People who received inactivated measles vaccine or an unknown type of measles vaccine during 1963-1967 should receive 2 doses of MMR vaccine. People who received inactivated mumps vaccine or an unknown type of mumps vaccine before 1979 and are at high risk for mumps infection should consider immunization with 2 doses of MMR vaccine. For females of childbearing age, rubella immunity should be determined. If there is no evidence of immunity, females who are not pregnant should be vaccinated. If there is no evidence of immunity, females who are pregnant should delay immunization until after pregnancy. Unvaccinated health care workers born before [birth year] who lack laboratory evidence of measles, mumps, or rubella immunity or laboratory confirmation of disease should consider measles and mumps immunization with 2 doses of MMR vaccine or rubella immunization with 1 dose of MMR vaccine.  Pneumococcal 13-valent conjugate (PCV13) vaccine. When indicated, a person who is uncertain of her immunization history and has no record of immunization should receive the PCV13 vaccine. An adult aged 72 years or older who has certain medical conditions and has not been previously immunized should receive 1 dose of PCV13 vaccine. This PCV13 should be followed  with a dose of pneumococcal polysaccharide (PPSV23) vaccine. The PPSV23 vaccine dose should be obtained at least 8 weeks after the dose of PCV13 vaccine. An adult aged 28 years or older who has certain medical conditions and previously received 1 or more doses of PPSV23 vaccine should receive 1 dose of PCV13. The PCV13 vaccine dose should be obtained 1 or more years after the last PPSV23 vaccine dose.    Pneumococcal polysaccharide (PPSV23) vaccine. When PCV13 is also indicated, PCV13 should be obtained first. All adults aged 58 years and older should be immunized. An adult younger than age 76 years who has certain medical conditions should be immunized. Any person who resides in a nursing home or long-term care facility should be immunized. An adult smoker should be immunized. People with an immunocompromised condition and certain other conditions should receive both PCV13 and PPSV23 vaccines. People with human immunodeficiency virus (HIV) infection should be immunized as soon as possible after diagnosis. Immunization during chemotherapy or radiation therapy should be avoided. Routine use of PPSV23 vaccine is not recommended for American Indians, [place], or people younger than 65 years unless there are medical conditions that require PPSV23 vaccine. When indicated, people who have unknown immunization and have no record of immunization should receive PPSV23 vaccine. One-time revaccination 5 years after the first dose of PPSV23 is recommended for people aged 19-64 years who have chronic kidney failure, nephrotic syndrome, asplenia, or immunocompromised conditions. People who received 1-2 doses of PPSV23 before age 30 years should receive another dose of PPSV23 vaccine at age 74 years or later if at least 5 years have passed since the previous dose. Doses of PPSV23 are not needed for people immunized with PPSV23 at or after age 20 years.  Preventive Services / Frequency   Ages 92 to 36 years  Blood  pressure check.  Lipid and cholesterol check.  Lung cancer screening. / Every year if you are aged 45-80 years and have a 30-pack-year history of smoking and currently smoke or have quit within the past 15  years. Yearly screening is stopped once you have quit smoking for at least 15 years or develop a health problem that would prevent you from having lung cancer treatment.  Clinical breast exam.** / Every year after age 21 years.  BRCA-related cancer risk assessment.** / For women who have family members with a BRCA-related cancer (breast, ovarian, tubal, or peritoneal cancers).  Mammogram.** / Every year beginning at age 69 years and continuing for as long as you are in good health. Consult with your health care provider.  Pap test.** / Every 3 years starting at age 31 years through age 25 or 92 years with a history of 3 consecutive normal Pap tests.  HPV screening.** / Every 3 years from ages 31 years through ages 82 to 58 years with a history of 3 consecutive normal Pap tests.  Fecal occult blood test (FOBT) of stool. / Every year beginning at age 69 years and continuing until age 32 years. You may not need to do this test if you get a colonoscopy every 10 years.  Flexible sigmoidoscopy or colonoscopy.** / Every 5 years for a flexible sigmoidoscopy or every 10 years for a colonoscopy beginning at age 17 years and continuing until age 59 years.  Hepatitis C blood test.** / For all people born from [birth year] through 1964 and any individual with known risks for hepatitis C.  Skin self-exam. / Monthly.  Influenza vaccine. / Every year.  Tetanus, diphtheria, and acellular pertussis (Tdap/Td) vaccine.** / Consult your health care provider. Pregnant women should receive 1 dose of Tdap vaccine during each pregnancy. 1 dose of Td every 10 years.  Varicella vaccine.** / Consult your health care provider. Pregnant females who do not have evidence of immunity should receive the first dose after  pregnancy.  Zoster vaccine.** / 1 dose for adults aged 66 years or older.  Pneumococcal 13-valent conjugate (PCV13) vaccine.** / Consult your health care provider.  Pneumococcal polysaccharide (PPSV23) vaccine.** / 1 to 2 doses if you smoke cigarettes or if you have certain conditions.  Meningococcal vaccine.** / Consult your health care provider.  Hepatitis A vaccine.** / Consult your health care provider.  Hepatitis B vaccine.** / Consult your health care provider. Screening for abdominal aortic aneurysm (AAA)  by ultrasound is recommended for people over 50 who have history of high blood pressure or who are current or former smokers.

## 2015-03-20 NOTE — Progress Notes (Signed)
Patient ID: [name], female   DOB: [birth year], 59 y.o.   MRN: [id number]   Comprehensive Examination  This very nice 59 y.o. MWF presents for complete physical.  Patient has been followed for HTN, Prediabetes, Hyperlipidemia, and Vitamin D Deficiency. Other problems include OSA with patient on CPAP with improved sleep hygiene and improved energy and less daytime excessive sleepiness. Other problem includes long hx/o Depression with hx/o hospitalization with conversion rxn and "pseudo CVA / TIA" in the remote past (2004) and patient has been stable for many years. Patient has been on SS Disability since 2007 for her panic Attacks and Depression which has been controlled.    Patient's labile HTN predates since 2004, altho she wasn't started on medication until 2014.  Patient's BP has been controlled at home and patient denies any cardiac symptoms as chest pain, palpitations, shortness of breath, dizziness or ankle swelling. Today's BP: 140/82 mmHg    Patient's hyperlipidemia is controlled with diet and medications. Patient denies myalgias or other medication SE's. Last lipids were at goal with Cholesterol 147; HDL 48; LDL 75; Triglycerides 118 on 11/28/2014.   Patient has prediabetes predating since June 2010 with A1c 5.7% and again June 2011 with A1c 5.7% and patient denies reactive hypoglycemic symptoms, visual blurring, diabetic polys, or paresthesias. Last A1c was 5.8% on 11/28/2014.    Finally, patient has history of Vitamin D Deficiency of 23 in 2008 and last Vitamin D was  60 on 11/28/2014.  Medication Sig  . acetaminophen 500 MG tablet Take 500-1,000 mg by mouth every 6 (six) hours as needed for pain.  . Albuterol Sulfate (PROAIR HFA  Inhale into the lungs. Uses PRN  . aspirin 81 MG tablet Take 81 mg by mouth daily.  . Cetirizine HCl 10 MG CAPS Take 1 capsule by mouth daily.  Marland Kitchen VITAMIN D  Take 6,000 Int'l Units by mouth daily.  . clonazePAM (KLONOPIN) 1 MG tablet Take 1 mg by mouth at  bedtime.  . CRESTOR 40 MG tablet TAKE 1 TABLET BY MOUTH DAILY  . cyclobenzaprine 10 MG tablet Take 1 tablet (10 mg total) by mouth 3 (three) times daily as needed for muscle spasms.  . Glucosamine-Chondroitin  Take 2 tablets by mouth daily.  Marland Kitchen HCTZ 25 MG tablet TAKE 1 TABLET BY MOUTH EVERY MORNING FOR BLOOD PRESSURE AND FLUID  . Ibuprofen 200 MG CAPS Take 1 capsule by mouth. PRN  . lamoTRIgine (LAMICTAL) 150 MG tablet Take 150 mg by mouth at bedtime.  . Magnesium 250 MG TABS Take 250 mg by mouth. Takes 5 pills per day  . meloxicam (MOBIC) 15 MG tablet Take 1 tablet (15 mg total) by mouth daily.  . MULTIVITAMIN WITH MINERALS Take 0.5 tablets by mouth 2 (two) times daily.  . naproxen sodium (ANAPROX) 220 MG tablet Take 220 mg by mouth 2 (two) times daily with a meal. PRN  . Omega-3 Fatty Acids (FISH OIL) 1000 MG CAPS Take by mouth daily.  . QUEtiapine (SEROQUEL) 100 MG tablet Take 100 mg by mouth 3 (three) times daily.  . traMADol (ULTRAM) 50 MG tablet Take 1 tablet (50 mg total) by mouth every 6 (six) hours as needed.   Allergies  Allergen Reactions  . Latex     Band aids= if left on for extended period of time  . Celecoxib     REACTION: Rash (celebrex)  . Cephalexin     REACTION: Rash (keflex)  . Codeine     REACTION: Rash  .  Erythromycin     REACTION: Rash (emycin)  . Sulfonamide Derivatives     REACTION: Rash  . Trazodone And Nefazodone     insomnia  . 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 Hypertension   . Sleep apnea     cpap   > 4 yrs  last sleep study  . Arthritis   . Depression   . Fibromyalgia   . Vitamin D deficiency   . Elevated hemoglobin A1c   . Unspecified essential hypertension 08/09/2013  . Other abnormal glucose 08/09/2013  . Unspecified vitamin D deficiency 08/09/2013   Health Maintenance  Topic Date Due  . PAP SMEAR  01/01/1978  . TETANUS/TDAP  06/22/2014  . INFLUENZA VACCINE  01/21/2015  .  MAMMOGRAM  05/16/2016  . COLONOSCOPY  12/11/2017  . Hepatitis C Screening  Completed  . HIV Screening  Completed   Immunization History  Administered Date(s) Administered  . Influenza Split 04/06/2012, 04/05/2013, 05/16/2014  . Pneumococcal Conjugate-13 05/16/2014  . Pneumococcal-Unspecified 06/22/1998  . Td 06/22/2004   Past Surgical History  Procedure Laterality Date  . Abdominal hysterectomy    . Joint replacement      arthroscopy  . Total knee arthroplasty Right 04/14/2013    Procedure: RIGHT TOTAL KNEE ARTHROPLASTY AND LEFT KNEE INJECTION ;  Surgeon: Alta Corning, MD;  Location: [facility];  Service: Orthopedics;  Laterality: Right;   Family History  Problem Relation Age of Onset  . Hypertension Mother   . Hypertension Father   . COPD Father   . Cancer Father     thyroid  . CVA Maternal Grandmother    Social History  Substance Use Topics  . Smoking status: Never Smoker   . Smokeless tobacco: None  . Alcohol Use: No    ROS Constitutional: Denies fever, chills, weight loss/gain, headaches, insomnia,  night sweats, and change in appetite. Does c/o fatigue. Eyes: Denies redness, blurred vision, diplopia, discharge, itchy, watery eyes.  ENT: Denies discharge, congestion, post nasal drip, epistaxis, sore throat, earache, hearing loss, dental pain, Tinnitus, Vertigo, Sinus pain, snoring.  Cardio: Denies chest pain, palpitations, irregular heartbeat, syncope, dyspnea, diaphoresis, orthopnea, PND, claudication, edema Respiratory: denies cough, dyspnea, DOE, pleurisy, hoarseness, laryngitis, wheezing.  Gastrointestinal: Denies dysphagia, heartburn, reflux, water brash, pain, cramps, nausea, vomiting, bloating, diarrhea, constipation, hematemesis, melena, hematochezia, jaundice, hemorrhoids Genitourinary: Denies dysuria, frequency, urgency, nocturia, hesitancy, discharge, hematuria, flank pain Breast: Breast lumps, nipple discharge, bleeding.  Musculoskeletal: Denies arthralgia,  myalgia, stiffness, Jt. Swelling, pain, limp, and strain/sprain. Denies falls. Skin: Denies puritis, rash, hives, warts, acne, eczema, changing in skin lesion Neuro: No weakness, tremor, incoordination, spasms, paresthesia, pain Psychiatric: Denies confusion, memory loss, sensory loss. Denies Depression. Endocrine: Denies change in weight, skin, hair change, nocturia, and paresthesia, diabetic polys, visual blurring, hyper / hypo glycemic episodes.  Heme/Lymph: No excessive bleeding, bruising, enlarged lymph nodes.  Physical Exam  BP 140/82 mmHg  Pulse 76  Temp(Src) 97.7 F (36.5 C)  Resp 18  Ht 5\' 5"  (1.651 m)  Wt 183 lb (83.008 kg)  BMI 30.45 kg/m2  General Appearance: Well nourished and in no apparent distress. Eyes: PERRLA, EOMs, conjunctiva no swelling or erythema, normal fundi and vessels. Sinuses: No frontal/maxillary tenderness ENT/Mouth: EACs patent / TMs  nl. Nares clear without erythema, swelling, mucoid exudates. Oral hygiene is good. No erythema, swelling, or exudate. Tongue normal, non-obstructing. Tonsils not swollen or erythematous. Hearing normal.  Neck: Supple, thyroid  normal. No bruits, nodes or JVD. Respiratory: Respiratory effort normal.  BS equal and clear bilateral without rales, rhonci, wheezing or stridor. Cardio: Heart sounds are normal with regular rate and rhythm and no murmurs, rubs or gallops. Peripheral pulses are normal and equal bilaterally without edema. No aortic or femoral bruits. Chest: symmetric with normal excursions and percussion. Breasts: Symmetric, without lumps, nipple discharge, retractions, or fibrocystic changes.  Abdomen: Flat, soft, with bowel sounds. Nontender, no guarding, rebound, hernias, masses, or organomegaly.  Lymphatics: Non tender without lymphadenopathy.  Genitourinary:  Musculoskeletal: Full ROM all peripheral extremities, joint stability, 5/5 strength, and normal gait. Skin: Warm and dry without rashes, lesions, cyanosis,  clubbing or  ecchymosis.  Neuro: Cranial nerves intact, reflexes equal bilaterally. Normal muscle tone, no cerebellar symptoms. Sensation intact.  Pysch: Alert and oriented X 3, normal affect, Insight and Judgment appropriate.   Assessment and Plan  1. Essential hypertension  - Microalbumin / creatinine urine ratio - EKG 12-Lead - [place], RETROPERITNL ABD,  LTD - TSH  2. Mixed hyperlipidemia  - Lipid panel  3. Prediabetes  - Hemoglobin A1c - Insulin, random  4. Vitamin D deficiency  - Vit D  25 hydroxy   5. Primary osteoarthritis of both knees   6. BMI 30.0-30.9,adult   7. Fibromyalgia   8. OSA on CPAP   9. Depression, controlled   10. Obesity (BMI 30.5)   11. Screening for rectal cancer  - POC Hemoccult Bld/Stl   12. Depression screen   13. Medication management  - CBC with Differential/Platelet - BASIC METABOLIC PANEL WITH GFR - Hepatic function panel - Magnesium - Lamotrigine level  14. At low risk for fall   Continue prudent diet as discussed, weight control, BP monitoring, regular exercise, and medications. Discussed med's effects and SE's. Screening labs and tests as requested with regular follow-up as recommended.  Over 40 minutes of exam, counseling, chart review was performed.

## 2015-03-21 ENCOUNTER — Encounter: Payer: Self-pay | Admitting: Internal Medicine

## 2015-03-21 DIAGNOSIS — Z23 Encounter for immunization: Secondary | ICD-10-CM | POA: Diagnosis not present

## 2015-03-21 LAB — BASIC METABOLIC PANEL WITH GFR
BUN: 20 mg/dL (ref 7–25)
CHLORIDE: 104 mmol/L (ref 98–110)
CO2: 27 mmol/L (ref 20–31)
CREATININE: 0.75 mg/dL (ref 0.50–1.05)
Calcium: 9.6 mg/dL (ref 8.6–10.4)
GFR, Est African American: >89 mL/min (ref >=60)
GFR, Est Non African American: 88 mL/min (ref >=60)
Glucose, Bld: 101 mg/dL — ABNORMAL HIGH (ref 65–99)
POTASSIUM: 4 mmol/L (ref 3.5–5.3)
Sodium: 143 mmol/L (ref 135–146)

## 2015-03-21 LAB — HEPATIC FUNCTION PANEL
ALBUMIN: 4.5 g/dL (ref 3.6–5.1)
ALK PHOS: 93 U/L (ref 33–130)
ALT: 27 U/L (ref 6–29)
AST: 21 U/L (ref 10–35)
BILIRUBIN TOTAL: 0.6 mg/dL (ref 0.2–1.2)
Bilirubin, Direct: 0.1 mg/dL (ref <=0.2)
Indirect Bilirubin: 0.5 mg/dL (ref 0.2–1.2)
Total Protein: 7 g/dL (ref 6.1–8.1)

## 2015-03-21 LAB — MICROALBUMIN / CREATININE URINE RATIO
CREATININE, URINE: 126.9 mg/dL
Microalb Creat Ratio: 7.1 mg/g (ref 0.0–30.0)
Microalb, Ur: 0.9 mg/dL (ref <2.0)

## 2015-03-21 LAB — TSH: TSH: 1.017 u[IU]/mL (ref 0.350–4.500)

## 2015-03-21 LAB — HEMOGLOBIN A1C
HEMOGLOBIN A1C: 5.7 % — AB (ref <5.7)
Mean Plasma Glucose: 117 mg/dL — ABNORMAL HIGH (ref <117)

## 2015-03-21 LAB — LIPID PANEL
CHOL/HDL RATIO: 3.7 ratio (ref <=5.0)
Cholesterol: 150 mg/dL (ref 125–200)
HDL: 41 mg/dL — AB (ref >=46)
LDL CALC: 77 mg/dL (ref <130)
Triglycerides: 162 mg/dL — ABNORMAL HIGH (ref <150)
VLDL: 32 mg/dL — ABNORMAL HIGH (ref <30)

## 2015-03-21 LAB — INSULIN, RANDOM: INSULIN: 40.2 u[IU]/mL — AB (ref 2.0–19.6)

## 2015-03-21 LAB — MAGNESIUM: MAGNESIUM: 2.3 mg/dL (ref 1.5–2.5)

## 2015-03-21 LAB — VITAMIN D 25 HYDROXY (VIT D DEFICIENCY, FRACTURES): Vit D, 25-Hydroxy: 65 ng/mL (ref 30–100)

## 2015-03-21 NOTE — Addendum Note (Signed)
Addended by: Emelda Brothers on: 03/21/2015 08:47 AM   Modules accepted: Orders

## 2015-03-23 LAB — LAMOTRIGINE LEVEL: Lamotrigine Lvl: 3.1 ug/mL — ABNORMAL LOW (ref 4.0–18.0)

## 2015-04-04 ENCOUNTER — Other Ambulatory Visit: Payer: Self-pay | Admitting: Internal Medicine

## 2015-04-05 ENCOUNTER — Other Ambulatory Visit: Payer: Self-pay | Admitting: *Deleted

## 2015-04-05 DIAGNOSIS — Z1212 Encounter for screening for malignant neoplasm of rectum: Secondary | ICD-10-CM

## 2015-04-05 LAB — POC HEMOCCULT BLD/STL (HOME/3-CARD/SCREEN)
Card #2 Fecal Occult Blod, POC: NEGATIVE
Card #3 Fecal Occult Blood, POC: NEGATIVE
FECAL OCCULT BLD: NEGATIVE

## 2015-04-23 ENCOUNTER — Encounter: Payer: Self-pay | Admitting: Gastroenterology

## 2015-07-01 ENCOUNTER — Other Ambulatory Visit: Payer: Self-pay | Admitting: Internal Medicine

## 2015-07-02 ENCOUNTER — Other Ambulatory Visit: Payer: Self-pay | Admitting: Internal Medicine

## 2015-07-03 ENCOUNTER — Other Ambulatory Visit: Payer: Self-pay

## 2015-07-03 DIAGNOSIS — Z1231 Encounter for screening mammogram for malignant neoplasm of breast: Secondary | ICD-10-CM

## 2015-07-05 ENCOUNTER — Ambulatory Visit (INDEPENDENT_AMBULATORY_CARE_PROVIDER_SITE_OTHER): Payer: Medicare Other | Admitting: Internal Medicine

## 2015-07-05 ENCOUNTER — Encounter: Payer: Self-pay | Admitting: Internal Medicine

## 2015-07-05 VITALS — BP 120/70 | HR 82 | Temp 97.8°F | Resp 16 | Ht 65.0 in | Wt 187.0 lb

## 2015-07-05 DIAGNOSIS — E782 Mixed hyperlipidemia: Secondary | ICD-10-CM

## 2015-07-05 DIAGNOSIS — R7303 Prediabetes: Secondary | ICD-10-CM

## 2015-07-05 DIAGNOSIS — E559 Vitamin D deficiency, unspecified: Secondary | ICD-10-CM

## 2015-07-05 DIAGNOSIS — Z79899 Other long term (current) drug therapy: Secondary | ICD-10-CM

## 2015-07-05 DIAGNOSIS — R6889 Other general symptoms and signs: Secondary | ICD-10-CM

## 2015-07-05 DIAGNOSIS — Z23 Encounter for immunization: Secondary | ICD-10-CM

## 2015-07-05 DIAGNOSIS — Z0001 Encounter for general adult medical examination with abnormal findings: Secondary | ICD-10-CM | POA: Diagnosis not present

## 2015-07-05 DIAGNOSIS — Z9181 History of falling: Secondary | ICD-10-CM

## 2015-07-05 DIAGNOSIS — Z0000 Encounter for general adult medical examination without abnormal findings: Secondary | ICD-10-CM

## 2015-07-05 DIAGNOSIS — I10 Essential (primary) hypertension: Secondary | ICD-10-CM

## 2015-07-05 LAB — BASIC METABOLIC PANEL WITH GFR
BUN: 15 mg/dL (ref 7–25)
CHLORIDE: 101 mmol/L (ref 98–110)
CO2: 29 mmol/L (ref 20–31)
Calcium: 9.8 mg/dL (ref 8.6–10.4)
Creat: 0.66 mg/dL (ref 0.50–1.05)
Glucose, Bld: 87 mg/dL (ref 65–99)
POTASSIUM: 3.8 mmol/L (ref 3.5–5.3)
SODIUM: 140 mmol/L (ref 135–146)

## 2015-07-05 LAB — HEPATIC FUNCTION PANEL
ALBUMIN: 4.1 g/dL (ref 3.6–5.1)
ALK PHOS: 89 U/L (ref 33–130)
ALT: 29 U/L (ref 6–29)
AST: 22 U/L (ref 10–35)
BILIRUBIN DIRECT: 0.1 mg/dL (ref <=0.2)
BILIRUBIN TOTAL: 0.5 mg/dL (ref 0.2–1.2)
Indirect Bilirubin: 0.4 mg/dL (ref 0.2–1.2)
Total Protein: 6.7 g/dL (ref 6.1–8.1)

## 2015-07-05 LAB — CBC WITH DIFFERENTIAL/PLATELET
Basophils Absolute: 0 10*3/uL (ref 0.0–0.1)
Basophils Relative: 1 % (ref 0–1)
Eosinophils Absolute: 0.3 10*3/uL (ref 0.0–0.7)
Eosinophils Relative: 6 % — ABNORMAL HIGH (ref 0–5)
HCT: 41 % (ref 36.0–46.0)
HEMOGLOBIN: 13.5 g/dL (ref 12.0–15.0)
LYMPHS PCT: 40 % (ref 12–46)
Lymphs Abs: 1.7 10*3/uL (ref 0.7–4.0)
MCH: 29.8 pg (ref 26.0–34.0)
MCHC: 32.9 g/dL (ref 30.0–36.0)
MCV: 90.5 fL (ref 78.0–100.0)
MONO ABS: 0.4 10*3/uL (ref 0.1–1.0)
MPV: 10.3 fL (ref 8.6–12.4)
Monocytes Relative: 10 % (ref 3–12)
NEUTROS PCT: 43 % (ref 43–77)
Neutro Abs: 1.8 10*3/uL (ref 1.7–7.7)
Platelets: 322 10*3/uL (ref 150–400)
RBC: 4.53 MIL/uL (ref 3.87–5.11)
RDW: 13.9 % (ref 11.5–15.5)
WBC: 4.3 10*3/uL (ref 4.0–10.5)

## 2015-07-05 LAB — LIPID PANEL
Cholesterol: 147 mg/dL (ref 125–200)
HDL: 45 mg/dL — ABNORMAL LOW (ref >=46)
LDL CALC: 75 mg/dL (ref <130)
Total CHOL/HDL Ratio: 3.3 Ratio (ref <=5.0)
Triglycerides: 137 mg/dL (ref <150)
VLDL: 27 mg/dL (ref <30)

## 2015-07-05 LAB — HEMOGLOBIN A1C
HEMOGLOBIN A1C: 5.8 % — AB (ref <5.7)
Mean Plasma Glucose: 120 mg/dL — ABNORMAL HIGH (ref <117)

## 2015-07-05 LAB — TSH: TSH: 1.173 u[IU]/mL (ref 0.350–4.500)

## 2015-07-05 MED ORDER — PREDNISONE 20 MG PO TABS: ORAL_TABLET | ORAL | Status: DC

## 2015-07-05 NOTE — Progress Notes (Signed)
Patient ID: [name], female   DOB: 07/31/1955, 60 y.o.   MRN: [id number]  MEDICARE ANNUAL WELLNESS VISIT AND FOLLOW UP  Assessment:    1. Essential hypertension -cont meds -DASH diet -monitor at home - TSH  2. Mixed hyperlipidemia  - Lipid panel  3. Prediabetes  - Hemoglobin A1c  4. Vitamin D deficiency -cont supplement  5. Medication management  - CBC with Differential/Platelet - BASIC METABOLIC PANEL WITH GFR - Hepatic function panel  6. Medicare annual wellness visit, subsequent   7. At low risk for fall   8. Need for prophylactic vaccination with tetanus-diphtheria (TD)  - DT Vaccine greater than 7yo IM    Over 30 minutes of exam, counseling, chart review, and critical decision making was performed  Plan:   During the course of the visit the patient was educated and counseled about appropriate screening and preventive services including:    Pneumococcal vaccine   Influenza vaccine  Td vaccine  Prevnar 13  Screening electrocardiogram  Screening mammography  Bone densitometry screening  Colorectal cancer screening  Diabetes screening  Glaucoma screening  Nutrition counseling   Advanced directives: given info/requested copies  Conditions/risks identified: PreDiabetes is at goal, ACE/ARB therapy: No, Reason not on Ace Inhibitor/ARB therapy:  not indicated Urinary Incontinence is not an issue: discussed non pharmacology and pharmacology options.  Fall risk: low- discussed PT, home fall assessment, medications.    Subjective:   [name] is a 60 y.o. female who presents for Medicare Annual Wellness Visit and 3 month follow up on hypertension, prediabetes, hyperlipidemia, vitamin D def.  Date of last medicare wellness visit is unknown.   Her blood pressure has been controlled at home, today their BP is BP: 120/70 mmHg She does not workout. She denies chest pain, shortness of breath, dizziness.  She is on cholesterol  medication and denies myalgias. Her cholesterol is at goal. The cholesterol last visit was:   Lab Results  Component Value Date   CHOL 150 03/20/2015   HDL 41* 03/20/2015   LDLCALC 77 03/20/2015   TRIG 162* 03/20/2015   CHOLHDL 3.7 03/20/2015   She has been working on diet and exercise for prediabetes, and denies foot ulcerations, hyperglycemia, hypoglycemia , increased appetite, nausea, paresthesia of the feet, polydipsia, polyuria, visual disturbances, vomiting and weight loss. Last A1C in the office was:  Lab Results  Component Value Date   HGBA1C 5.7* 03/20/2015   Last GFR NonAA   Lab Results  Component Value Date   GFRNONAA 88 03/20/2015   AA  Lab Results  Component Value Date   GFRAA >89 03/20/2015   Patient is on Vitamin D supplement. Lab Results  Component Value Date   VD25OH 65 03/20/2015     Patient reports that she has had a stuffy nose and has been having some sinus pressure and congestion.  She is using mucinex at home and is also use a netti pot.  Husband has similar symptoms.    Medication Review Current Outpatient Prescriptions on File Prior to Visit  Medication Sig Dispense Refill  . ACCU-CHEK AVIVA PLUS test strip USE AS DIRECTED DAILY 100 each 99  . acetaminophen (TYLENOL) 500 MG tablet Take 500-1,000 mg by mouth every 6 (six) hours as needed for pain.    Marland Kitchen aspirin 81 MG tablet Take 81 mg by mouth daily.    . Cetirizine HCl 10 MG CAPS Take 1 capsule by mouth daily.    . Cholecalciferol (VITAMIN D PO) Take  6,000 Int'l Units by mouth daily.    . clonazePAM (KLONOPIN) 1 MG tablet Take 1 mg by mouth at bedtime.    . CRESTOR 40 MG tablet TAKE 1 TABLET BY MOUTH DAILY 30 tablet 3  . hydrochlorothiazide (HYDRODIURIL) 25 MG tablet TAKE 1 TABLET BY MOUTH EVERY MORNING FOR BLOOD PRESSURE AND FLUID RETENTION 90 tablet 1  . Ibuprofen 200 MG CAPS Take 1 capsule by mouth. PRN    . lamoTRIgine (LAMICTAL) 150 MG tablet Take 150 mg by mouth at bedtime.    . Lancets  (ACCU-CHEK MULTICLIX) lancets Use to check blood glucose daily 100 each PRN  . Magnesium 250 MG TABS Take 250 mg by mouth. Takes 5 pills per day    . meloxicam (MOBIC) 15 MG tablet Take 1 tablet (15 mg total) by mouth daily. 90 tablet 99  . Multiple Vitamin (MULTIVITAMIN WITH MINERALS) TABS tablet Take 0.5 tablets by mouth 2 (two) times daily.    . naproxen sodium (ANAPROX) 220 MG tablet Take 220 mg by mouth 2 (two) times daily with a meal. PRN    . Omega-3 Fatty Acids (FISH OIL) 1000 MG CAPS Take by mouth daily.    . QUEtiapine (SEROQUEL) 100 MG tablet Take 100 mg by mouth 3 (three) times daily.    . traMADol (ULTRAM) 50 MG tablet Take 1 tablet (50 mg total) by mouth every 6 (six) hours as needed. 30 tablet 0   No current facility-administered medications on file prior to visit.    Current Problems (verified) Patient Active Problem List   Diagnosis Date Noted  . Osteoarthritis of both knees 03/20/2015  . BMI 30.0-30.9,adult 03/20/2015  . Medicare annual wellness visit, subsequent 03/20/2015  . At low risk for fall 11/11/2014  . OSA on CPAP 08/15/2014  . Medication management 01/31/2014  . Fibromyalgia 01/31/2014  . Depression, controlled 01/31/2014  . Obesity (BMI 30.5) 01/31/2014  . Essential hypertension 08/09/2013  . Mixed hyperlipidemia 08/09/2013  . Prediabetes 08/09/2013  . Vitamin D deficiency 08/09/2013    Screening Tests Immunization History  Administered Date(s) Administered  . DT 07/05/2015  . Influenza Split 04/06/2012, 04/05/2013, 05/16/2014, 03/21/2015  . Pneumococcal Conjugate-13 05/16/2014  . Pneumococcal-Unspecified 06/22/1998  . Td 06/22/2004    Preventative care: Last colonoscopy: 2009 Last mammogram: Scheduled for Feb 1st  DEXA:2013, patient declined  Prior vaccinations: TD or Tdap: 2006  Influenza: 2016  Pneumococcal: 2000 Prevnar13: 2015 Shingles/Zostavax:   Names of Other Physician/Practitioners you currently use: 1. [facility]- here for primary care 2. Dr. Sabra Heck, eye doctor, last visit 2016 3. Dr. Quillian Quince, dentist, last visit 2016 Patient Care Team: Unk Pinto, MD as PCP - General (Internal Medicine) Dorna Leitz, MD as Consulting Physician (Orthopedic Surgery) Milus Banister, MD as Attending Physician (Gastroenterology) Sheralyn Boatman, MD as Consulting Physician (Psychiatry) [name], [place] (Optometry) [name], DDS as Referring Physician (Dentistry)  Past Surgical History  Procedure Laterality Date  . Abdominal hysterectomy    . Joint replacement      arthroscopy  . Total knee arthroplasty Right 04/14/2013    Procedure: RIGHT TOTAL KNEE ARTHROPLASTY AND LEFT KNEE INJECTION ;  Surgeon: Alta Corning, MD;  Location: [facility];  Service: Orthopedics;  Laterality: Right;   Family History  Problem Relation Age of Onset  . Hypertension Mother   . Hypertension Father   . COPD Father   . Cancer Father     thyroid  . CVA Maternal Grandmother  Social History  Substance Use Topics  . Smoking status: Never Smoker   . Smokeless tobacco: None  . Alcohol Use: No    MEDICARE WELLNESS OBJECTIVES: Tobacco use: She does not smoke.  Patient is not a former smoker. If yes, counseling given Alcohol Current alcohol use: none Osteoporosis: postmenopausal estrogen deficiency and dietary calcium and/or vitamin D deficiency, History of fracture in the past year: no Fall risk: Minimal risk Hearing: normal Visual acuity: normal,  does perform annual eye exam Diet: well balanced Physical activity: Current Exercise Habits:: The patient does not participate in regular exercise at present Cardiac risk factors: Cardiac Risk Factors include: advanced age (>36men, >54 women);dyslipidemia;hypertension;sedentary lifestyle Depression/mood screen:   Depression screen [facility] 2/9 07/05/2015  Decreased Interest 0  Down, Depressed, Hopeless 0  PHQ - 2 Score 0  Altered sleeping -  Tired,  decreased energy -  Change in appetite -  Feeling bad or failure about yourself  -  Trouble concentrating -  Moving slowly or fidgety/restless -  Suicidal thoughts -  PHQ-9 Score -    ADLs:  In your present state of health, do you have any difficulty performing the following activities: 07/05/2015 03/20/2015  Hearing? N N  Vision? N N  Difficulty concentrating or making decisions? N N  Walking or climbing stairs? N N  Dressing or bathing? N N  Doing errands, shopping? N N  Preparing Food and eating ? N -  Using the Toilet? N -  In the past six months, have you accidently leaked urine? N -  Do you have problems with loss of bowel control? N -  Managing your Medications? N -  Managing your Finances? N -  Housekeeping or managing your Housekeeping? N -     Cognitive Testing  Alert? Yes  Normal Appearance?Yes  Oriented to person? Yes  Place? Yes   Time? Yes  Recall of three objects?  Yes  Can perform simple calculations? Yes  Displays appropriate judgment?Yes  Can read the correct time from a watch face?Yes  EOL planning: Does patient have an advance directive?: No Would patient like information on creating an advanced directive?: Yes - Educational materials given   Objective:   Today's Vitals   07/05/15 0923  BP: 120/70  Pulse: 82  Temp: 97.8 F (36.6 C)  TempSrc: Temporal  Resp: 16  Height: 5\' 5"  (1.651 m)  Weight: 187 lb (84.823 kg)   Body mass index is 31.12 kg/(m^2).  General appearance: alert, no distress, WD/WN,  female HEENT: normocephalic, sclerae anicteric, TMs pearly, nares patent, no discharge or erythema, pharynx normal Oral cavity: MMM, no lesions Neck: supple, no lymphadenopathy, no thyromegaly, no masses Heart: RRR, normal S1, S2, no murmurs Lungs: CTA bilaterally, no wheezes, rhonchi, or rales Abdomen: +bs, soft, non tender, non distended, no masses, no hepatomegaly, no splenomegaly Musculoskeletal: nontender, no swelling, no obvious  deformity Extremities: no edema, no cyanosis, no clubbing Pulses: 2+ symmetric, upper and lower extremities, normal cap refill Neurological: alert, oriented x 3, CN2-12 intact, strength normal upper extremities and lower extremities, sensation normal throughout, DTRs 2+ throughout, no cerebellar signs, gait normal Psychiatric: normal affect, behavior normal, pleasant  Breast: defer Gyn: defer Rectal: defer   Medicare Attestation I have personally reviewed: The patient's medical and social history Their use of alcohol, tobacco or illicit drugs Their current medications and supplements The patient's functional ability including ADLs,fall risks, home safety risks, cognitive, and hearing and visual impairment Diet and physical activities Evidence for depression or  mood disorders  The patient's weight, height, BMI, and visual acuity have been recorded in the chart.  I have made referrals, counseling, and provided education to the patient based on review of the above and I have provided the patient with a written personalized care plan for preventive services.     Starlyn Skeans, PA-C   07/05/2015

## 2015-07-24 ENCOUNTER — Ambulatory Visit
Admission: RE | Admit: 2015-07-24 | Discharge: 2015-07-24 | Disposition: A | Discharge status: Home / Self Care | Payer: Medicare Other | Source: Ambulatory Visit

## 2015-07-24 DIAGNOSIS — Z1231 Encounter for screening mammogram for malignant neoplasm of breast: Secondary | ICD-10-CM

## 2015-09-18 ENCOUNTER — Other Ambulatory Visit: Payer: Self-pay | Admitting: *Deleted

## 2015-09-18 MED ORDER — GLUCOSE BLOOD VI STRP: ORAL_STRIP | Status: DC

## 2015-09-24 ENCOUNTER — Other Ambulatory Visit: Payer: Self-pay | Admitting: Internal Medicine

## 2015-09-28 ENCOUNTER — Other Ambulatory Visit: Payer: Self-pay | Admitting: Internal Medicine

## 2015-10-04 ENCOUNTER — Ambulatory Visit: Payer: Self-pay | Admitting: Internal Medicine

## 2015-10-11 ENCOUNTER — Ambulatory Visit (INDEPENDENT_AMBULATORY_CARE_PROVIDER_SITE_OTHER): Payer: Medicare Other | Admitting: Internal Medicine

## 2015-10-11 ENCOUNTER — Ambulatory Visit: Payer: Self-pay | Admitting: Internal Medicine

## 2015-10-11 ENCOUNTER — Encounter: Payer: Self-pay | Admitting: Internal Medicine

## 2015-10-11 VITALS — BP 122/74 | HR 86 | Temp 98.0°F | Resp 16 | Ht 65.0 in | Wt 187.0 lb

## 2015-10-11 DIAGNOSIS — I10 Essential (primary) hypertension: Secondary | ICD-10-CM

## 2015-10-11 DIAGNOSIS — Z79899 Other long term (current) drug therapy: Secondary | ICD-10-CM

## 2015-10-11 DIAGNOSIS — R7303 Prediabetes: Secondary | ICD-10-CM | POA: Diagnosis not present

## 2015-10-11 DIAGNOSIS — E782 Mixed hyperlipidemia: Secondary | ICD-10-CM | POA: Diagnosis not present

## 2015-10-11 DIAGNOSIS — E559 Vitamin D deficiency, unspecified: Secondary | ICD-10-CM | POA: Diagnosis not present

## 2015-10-11 LAB — CBC WITH DIFFERENTIAL/PLATELET
BASOS ABS: 0 {cells}/uL (ref 0–200)
Basophils Relative: 0 %
EOS PCT: 3 %
Eosinophils Absolute: 153 cells/uL (ref 15–500)
HCT: 42.5 % (ref 35.0–45.0)
Hemoglobin: 14 g/dL (ref 11.7–15.5)
Lymphocytes Relative: 39 %
Lymphs Abs: 1989 cells/uL (ref 850–3900)
MCH: 29.9 pg (ref 27.0–33.0)
MCHC: 32.9 g/dL (ref 32.0–36.0)
MCV: 90.6 fL (ref 80.0–100.0)
MPV: 9.8 fL (ref 7.5–12.5)
Monocytes Absolute: 408 cells/uL (ref 200–950)
Monocytes Relative: 8 %
NEUTROS ABS: 2550 {cells}/uL (ref 1500–7800)
NEUTROS PCT: 50 %
PLATELETS: 349 10*3/uL (ref 140–400)
RBC: 4.69 MIL/uL (ref 3.80–5.10)
RDW: 14 % (ref 11.0–15.0)
WBC: 5.1 10*3/uL (ref 3.8–10.8)

## 2015-10-11 LAB — HEMOGLOBIN A1C
Hgb A1c MFr Bld: 5.7 % — ABNORMAL HIGH (ref <5.7)
Mean Plasma Glucose: 117 mg/dL

## 2015-10-11 LAB — BASIC METABOLIC PANEL WITH GFR
BUN: 17 mg/dL (ref 7–25)
CALCIUM: 9.8 mg/dL (ref 8.6–10.4)
CO2: 26 mmol/L (ref 20–31)
CREATININE: 0.69 mg/dL (ref 0.50–1.05)
Chloride: 103 mmol/L (ref 98–110)
Glucose, Bld: 84 mg/dL (ref 65–99)
Potassium: 4.2 mmol/L (ref 3.5–5.3)
SODIUM: 137 mmol/L (ref 135–146)

## 2015-10-11 LAB — HEPATIC FUNCTION PANEL
ALT: 33 U/L — ABNORMAL HIGH (ref 6–29)
AST: 25 U/L (ref 10–35)
Albumin: 4.3 g/dL (ref 3.6–5.1)
Alkaline Phosphatase: 85 U/L (ref 33–130)
BILIRUBIN DIRECT: 0.1 mg/dL (ref <=0.2)
BILIRUBIN TOTAL: 0.7 mg/dL (ref 0.2–1.2)
Indirect Bilirubin: 0.6 mg/dL (ref 0.2–1.2)
Total Protein: 7.1 g/dL (ref 6.1–8.1)

## 2015-10-11 LAB — LIPID PANEL
CHOL/HDL RATIO: 3.2 ratio (ref <=5.0)
CHOLESTEROL: 161 mg/dL (ref 125–200)
HDL: 51 mg/dL (ref >=46)
LDL Cholesterol: 80 mg/dL (ref <130)
TRIGLYCERIDES: 152 mg/dL — AB (ref <150)
VLDL: 30 mg/dL (ref <30)

## 2015-10-11 LAB — TSH: TSH: 1.27 m[IU]/L

## 2015-10-11 MED ORDER — ACYCLOVIR 400 MG PO TABS: 400.0000 mg | ORAL_TABLET | Freq: Three times a day (TID) | ORAL | Status: DC

## 2015-10-11 MED ORDER — ACYCLOVIR 5 % EX OINT: TOPICAL_OINTMENT | CUTANEOUS | Status: AC

## 2015-10-11 MED ORDER — HYDROCHLOROTHIAZIDE 25 MG PO TABS: ORAL_TABLET | ORAL | Status: DC

## 2015-10-11 NOTE — Progress Notes (Signed)
Assessment and Plan:  Hypertension:  -Continue medication,  -monitor blood pressure at home.  -Continue DASH diet.   -Reminder to go to the ER if any CP, SOB, nausea, dizziness, severe HA, changes vision/speech, left arm numbness and tingling, and jaw pain.  Cholesterol: -Continue diet and exercise.   Pre-diabetes: -Continue diet and exercise.   Vitamin D Def: -continue medications.   Declined lab work today due to financial reasons and will obtain at the next visit. Labs have been reasonably well controlled lately.  Agree to skip today.    Continue diet and meds as discussed. Further disposition pending results of labs.  HPI 60 y.o. female  presents for 3 month follow up with hypertension, hyperlipidemia, prediabetes and vitamin D.   Her blood pressure has been controlled at home, today their BP is BP: 122/74 mmHg.   She does not workout. She denies chest pain, shortness of breath, dizziness.   She is on cholesterol medication and denies myalgias. Her cholesterol is at goal. The cholesterol last visit was:   Lab Results  Component Value Date   CHOL 147 07/05/2015   HDL 45* 07/05/2015   LDLCALC 75 07/05/2015   TRIG 137 07/05/2015   CHOLHDL 3.3 07/05/2015     She has been working on diet and exercise for prediabetes, and denies foot ulcerations, hyperglycemia, hypoglycemia , increased appetite, nausea, paresthesia of the feet, polydipsia, polyuria, visual disturbances, vomiting and weight loss. Last A1C in the office was:  Lab Results  Component Value Date   HGBA1C 5.8* 07/05/2015    Patient is on Vitamin D supplement.  Lab Results  Component Value Date   VD25OH 40 03/20/2015     She has recently been using apple cider vinegar daily for the last couple weeks and feels like it is helping with appetite control.    She has also been experinecing fever blister   Current Medications:  Current Outpatient Prescriptions on File Prior to Visit  Medication Sig Dispense Refill   . acetaminophen (TYLENOL) 500 MG tablet Take 500-1,000 mg by mouth every 6 (six) hours as needed for pain.    Marland Kitchen aspirin 81 MG tablet Take 81 mg by mouth daily.    . Cetirizine HCl 10 MG CAPS Take 1 capsule by mouth daily.    . Cholecalciferol (VITAMIN D PO) Take 6,000 Int'l Units by mouth daily.    . clonazePAM (KLONOPIN) 1 MG tablet Take 1 mg by mouth at bedtime.    Marland Kitchen glucose blood (ACCU-CHEK AVIVA PLUS) test strip USE AS DIRECTED DAILY 100 each 1  . hydrochlorothiazide (HYDRODIURIL) 25 MG tablet TAKE 1 TABLET BY MOUTH EVERY MORNING FOR BLOOD PRESSURE AND FLUID RETENTION 90 tablet 1  . Ibuprofen 200 MG CAPS Take 1 capsule by mouth. PRN    . lamoTRIgine (LAMICTAL) 150 MG tablet Take 150 mg by mouth at bedtime.    . Lancets (ACCU-CHEK MULTICLIX) lancets Use to check blood glucose daily 100 each PRN  . Magnesium 250 MG TABS Take 250 mg by mouth. Takes 5 pills per day    . meloxicam (MOBIC) 15 MG tablet Take 1 tablet (15 mg total) by mouth daily. 90 tablet 99  . Multiple Vitamin (MULTIVITAMIN WITH MINERALS) TABS tablet Take 0.5 tablets by mouth 2 (two) times daily.    . naproxen sodium (ANAPROX) 220 MG tablet Take 220 mg by mouth 2 (two) times daily with a meal. PRN    . Omega-3 Fatty Acids (FISH OIL) 1000 MG CAPS Take by  mouth daily.    . QUEtiapine (SEROQUEL) 100 MG tablet Take 100 mg by mouth 3 (three) times daily.    . rosuvastatin (CRESTOR) 40 MG tablet TAKE 1 TABLET BY MOUTH DAILY 30 tablet 0  . traMADol (ULTRAM) 50 MG tablet Take 1 tablet (50 mg total) by mouth every 6 (six) hours as needed. 30 tablet 0   No current facility-administered medications on file prior to visit.    Medical History:  Past Medical History  Diagnosis Date  . Hypertension   . Sleep apnea     cpap   > 4 yrs  last sleep study  . Arthritis   . Depression   . Fibromyalgia   . Vitamin D deficiency   . Elevated hemoglobin A1c   . Unspecified essential hypertension 08/09/2013  . Other abnormal glucose 08/09/2013   . Unspecified vitamin D deficiency 08/09/2013    Allergies:  Allergies  Allergen Reactions  . Latex     Band aids= if left on for extended period of time  . Celecoxib     REACTION: Rash (celebrex)  . Cephalexin     REACTION: Rash (keflex)  . Codeine     REACTION: Rash  . Erythromycin     REACTION: Rash (emycin)  . Sulfonamide Derivatives     REACTION: Rash  . Trazodone And Nefazodone     insomnia  . 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  HENT: Negative for congestion, ear pain and sore throat.   Eyes: Negative.   Respiratory: Negative for cough, shortness of breath and wheezing.   Cardiovascular: Negative for chest pain, palpitations and leg swelling.  Gastrointestinal: Negative for heartburn, abdominal pain, diarrhea, constipation, blood in stool and melena.  Genitourinary: Negative.   Skin: Negative.   Neurological: Negative for dizziness, sensory change, loss of consciousness and headaches.  Psychiatric/Behavioral: Negative for depression. The patient is not nervous/anxious and does not have insomnia.     Family history- Review and unchanged  Social history- Review and unchanged  Physical Exam: BP 122/74 mmHg  Pulse 86  Temp(Src) 98 F (36.7 C) (Temporal)  Resp 16  Ht 5\' 5"  (1.651 m)  Wt 187 lb (84.823 kg)  BMI 31.12 kg/m2 Wt Readings from Last 3 Encounters:  10/11/15 187 lb (84.823 kg)  07/05/15 187 lb (84.823 kg)  03/20/15 183 lb (83.008 kg)    General Appearance: Well nourished well developed, in no apparent distress. Eyes: PERRLA, EOMs, conjunctiva no swelling or erythema ENT/Mouth: Ear canals normal without obstruction, swelling, erythma, discharge.  TMs normal bilaterally.  Oropharynx moist, clear, without exudate, or postoropharyngeal swelling. Neck: Supple, thyroid normal,no cervical adenopathy  Respiratory: Respiratory effort  normal, Breath sounds clear A&P without rhonchi, wheeze, or rale.  No retractions, no accessory usage. Cardio: RRR with no MRGs. Brisk peripheral pulses without edema.  Abdomen: Soft, + BS,  Non tender, no guarding, rebound, hernias, masses. Musculoskeletal: Full ROM, 5/5 strength, Normal gait Skin: Warm, dry without rashes, lesions, ecchymosis.  Neuro: Awake and oriented X 3, Cranial nerves intact. Normal muscle tone, no cerebellar symptoms. Psych: Normal affect, Insight and Judgment appropriate.    Starlyn Skeans, PA-C 11:32 AM [facility]

## 2015-10-21 ENCOUNTER — Other Ambulatory Visit: Payer: Self-pay | Admitting: Internal Medicine

## 2016-01-10 ENCOUNTER — Encounter: Payer: Self-pay | Admitting: Internal Medicine

## 2016-01-10 ENCOUNTER — Ambulatory Visit (INDEPENDENT_AMBULATORY_CARE_PROVIDER_SITE_OTHER): Payer: Medicare Other | Admitting: Internal Medicine

## 2016-01-10 VITALS — BP 112/74 | HR 76 | Temp 97.7°F | Resp 16 | Ht 65.0 in | Wt 187.6 lb

## 2016-01-10 DIAGNOSIS — G4733 Obstructive sleep apnea (adult) (pediatric): Secondary | ICD-10-CM | POA: Diagnosis not present

## 2016-01-10 DIAGNOSIS — E782 Mixed hyperlipidemia: Secondary | ICD-10-CM

## 2016-01-10 DIAGNOSIS — M797 Fibromyalgia: Secondary | ICD-10-CM | POA: Diagnosis not present

## 2016-01-10 DIAGNOSIS — Z79899 Other long term (current) drug therapy: Secondary | ICD-10-CM

## 2016-01-10 DIAGNOSIS — R7303 Prediabetes: Secondary | ICD-10-CM | POA: Diagnosis not present

## 2016-01-10 DIAGNOSIS — Z9989 Dependence on other enabling machines and devices: Secondary | ICD-10-CM

## 2016-01-10 DIAGNOSIS — E559 Vitamin D deficiency, unspecified: Secondary | ICD-10-CM

## 2016-01-10 DIAGNOSIS — I10 Essential (primary) hypertension: Secondary | ICD-10-CM | POA: Diagnosis not present

## 2016-01-10 LAB — CBC WITH DIFFERENTIAL/PLATELET
BASOS PCT: 0 %
Basophils Absolute: 0 cells/uL (ref 0–200)
Eosinophils Absolute: 159 cells/uL (ref 15–500)
Eosinophils Relative: 3 %
HEMATOCRIT: 42.2 % (ref 35.0–45.0)
HEMOGLOBIN: 13.8 g/dL (ref 11.7–15.5)
LYMPHS PCT: 36 %
Lymphs Abs: 1908 cells/uL (ref 850–3900)
MCH: 29.8 pg (ref 27.0–33.0)
MCHC: 32.7 g/dL (ref 32.0–36.0)
MCV: 91.1 fL (ref 80.0–100.0)
MONO ABS: 424 {cells}/uL (ref 200–950)
MPV: 9.9 fL (ref 7.5–12.5)
Monocytes Relative: 8 %
Neutro Abs: 2809 cells/uL (ref 1500–7800)
Neutrophils Relative %: 53 %
Platelets: 325 10*3/uL (ref 140–400)
RBC: 4.63 MIL/uL (ref 3.80–5.10)
RDW: 13.9 % (ref 11.0–15.0)
WBC: 5.3 10*3/uL (ref 3.8–10.8)

## 2016-01-10 LAB — TSH: TSH: 1.31 m[IU]/L

## 2016-01-10 NOTE — Patient Instructions (Signed)

## 2016-01-10 NOTE — Progress Notes (Signed)
Patient ID: [name], female   DOB: July 20, 1955, 60 y.o.   MRN: [id number]  [facility]                       Unk Pinto, M.D.        Uvaldo Bristle. Silverio Lay, P.A.-C       Starlyn Skeans, P.A.-C   [facility]                [address] [place], [place] [id number] Telephone [phone] Telefax [phone] ______________________________________________________________________     This very nice 60 y.o. MWF presents for  follow up with Hypertension, Hyperlipidemia, Pre-Diabetes and Vitamin D Deficiency. In addition, patient is on CPAP for OSA. Patient has been on SS Disability for Panic Disorder since 2007 and has been followed since by Dr Toy Care.      Patient is treated for HTN circa 2004, altho treatment was n't started until 2014 & BP has been controlled at home. Today's BP: 112/74 mmHg. Patient has had no complaints of any cardiac type chest pain, palpitations, dyspnea/orthopnea/PND, dizziness, claudication, or dependent edema.     Hyperlipidemia is controlled with diet & meds. Patient denies myalgias or other med SE's. Last Lipids were at goal with Cholesterol 161; HDL 51; LDL 80; Triglycerides 152 on 10/11/2015.     Also, the patient has history of Morbid Obesity (BMI 31+) and consequent PreDiabetes with A1c 5.7% in 2011  and has had no symptoms of reactive hypoglycemia, diabetic polys, paresthesias or visual blurring.  Last A1c was still 5.7% on 10/11/2015.     Further, the patient also has history of Vitamin D Deficiency of "23" in 2008 and again low at "8" in 2015 and supplements vitamin D without any suspected side-effects. Last vitamin D was 65 on 03/20/2015.  Medication Sig  . acetaminophen  500 MG  Take 500-1,000 mg by mouth every 6 (six) hours as needed for pain.  Marland Kitchen acyclovir  400 MG  Take 1 tablet (400 mg total) by mouth 3 (three) times daily.  Marland Kitchen acyclovir oint  5 % Apply thin layer to  affected area  . aspirin 81 MG Take 81 mg by mouth daily.  . Cetirizine HCl 10 MG  Take 1 capsule by mouth daily.  Marland Kitchen VITAMIN D Take 6,000 Int'l Units by mouth daily.  . clonazePAM  1 MG  Take 1 mg by mouth at bedtime.  . hctz 25 MG  TAKE 1 TABLET BY MOUTH EVERY MORNING   . Ibuprofen 200 MG  Take 1 capsule by mouth. PRN  . lamoTRIgine  150 MG Take 150 mg by mouth at bedtime.  Marland Kitchen Lysine 500 MG Take by mouth daily. Currently taking 6 pill QD  . Magnesium 250 MG  Take 250 mg by mouth. Takes 5 pills per day  . meloxicam  15 MG  Take 1 tablet (15 mg total) by mouth daily.  . MultiVit w/Min Take 0.5 tablets by mouth 2 (two) times daily.  . naproxen  220 MG  Take 220 mg by mouth 2 (two) times daily with a meal. PRN  . Omega-3 FISH OIL 1000 MG  Take by mouth daily.  .  OsteoBioFlex 2 tablets daily.  . QUEtiapine  100 MG  Take 100 mg by mouth 3 (three) times daily.  . rosuvastatin 40 MG  TAKE 1  TABLET BY MOUTH DAILY  . traMADol 50 MG  Take 1 tablet (50 mg total) by mouth every 6 (six) hours as needed.   Allergies  Allergen Reactions  . Latex     Band aids= if left on for extended period of time  . Celecoxib     REACTION: Rash (celebrex)  . Cephalexin     REACTION: Rash (keflex)  . Codeine     REACTION: Rash  . Erythromycin     REACTION: Rash (emycin)  . Sulfonamide Derivatives     REACTION: Rash  . Trazodone And Nefazodone     insomnia  . 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 Hypertension   . Sleep apnea     cpap   > 4 yrs  last sleep study  . Arthritis   . Depression   . Fibromyalgia   . Vitamin D deficiency   . Elevated hemoglobin A1c   . Unspecified essential hypertension 08/09/2013  . Other abnormal glucose 08/09/2013  . Unspecified vitamin D deficiency 08/09/2013   Immunization History  Administered Date(s) Administered  . DT 07/05/2015  . Influenza Split 04/06/2012, 04/05/2013, 05/16/2014, 03/21/2015  .  Pneumococcal Conjugate-13 05/16/2014  . Pneumococcal-Unspecified 06/22/1998  . Td 06/22/2004   Past Surgical History  Procedure Laterality Date  . Abdominal hysterectomy    .  Rt. Total knee arthroplasty - Alta Corning, MD Right 04/14/2013   FHx:    Reviewed / unchanged  SHx:    Reviewed / unchanged  Systems Review:  Constitutional: Denies fever, chills, wt changes, headaches, insomnia, fatigue, night sweats, change in appetite. Eyes: Denies redness, blurred vision, diplopia, discharge, itchy, watery eyes.  ENT: Denies discharge, congestion, post nasal drip, epistaxis, sore throat, earache, hearing loss, dental pain, tinnitus, vertigo, sinus pain, snoring.  CV: Denies chest pain, palpitations, irregular heartbeat, syncope, dyspnea, diaphoresis, orthopnea, PND, claudication or edema. Respiratory: denies cough, dyspnea, DOE, pleurisy, hoarseness, laryngitis, wheezing.  Gastrointestinal: Denies dysphagia, odynophagia, heartburn, reflux, water brash, abdominal pain or cramps, nausea, vomiting, bloating, diarrhea, constipation, hematemesis, melena, hematochezia  or hemorrhoids. Genitourinary: Denies dysuria, frequency, urgency, nocturia, hesitancy, discharge, hematuria or flank pain. Musculoskeletal: Denies arthralgias, myalgias, stiffness, jt. swelling, pain, limping or strain/sprain.  Skin: Denies pruritus, rash, hives, warts, acne, eczema or change in skin lesion(s). Neuro: No weakness, tremor, incoordination, spasms, paresthesia or pain. Psychiatric: Denies confusion, memory loss or sensory loss. Endo: Denies change in weight, skin or hair change.  Heme/Lymph: No excessive bleeding, bruising or enlarged lymph nodes.  Physical Exam  BP 112/74  Pulse 76  Temp 97.7 F   Resp 16  Ht 5\' 5"    Wt 187 lb 9.6 oz     BMI 31.22   Appears over nourished and in no distress. Eyes: PERRLA, EOMs, conjunctiva no swelling or erythema. Sinuses: No frontal/maxillary tenderness ENT/Mouth: EAC's  clear, TM's nl w/o erythema, bulging. Nares clear w/o erythema, swelling, exudates. Oropharynx clear without erythema or exudates. Oral hygiene is good. Tongue normal, non obstructing. Hearing intact.  Neck: Supple. Thyroid nl. Car 2+/2+ without bruits, nodes or JVD. Chest: Respirations nl with BS clear & equal w/o rales, rhonchi, wheezing or stridor.  Cor: Heart sounds normal w/ regular rate and rhythm without sig. murmurs, gallops, clicks, or rubs. Peripheral pulses normal and equal  without edema.  Abdomen: Soft & bowel sounds normal. Non-tender w/o guarding, rebound, hernias, masses, or organomegaly.  Lymphatics: Unremarkable.  Musculoskeletal: Full ROM all peripheral extremities, joint stability, 5/5 strength, and normal gait.  Skin: Warm, dry without exposed rashes, lesions or ecchymosis apparent.  Neuro: Cranial nerves intact, reflexes equal bilaterally. Sensory-motor testing grossly intact. Tendon reflexes grossly intact.  Pysch: Alert & oriented x 3.  Insight and judgement nl & appropriate. No ideations.  Assessment and Plan:  1. Essential hypertension  - Continue medication, monitor blood pressure at home. Continue DASH diet. Reminder to go to the ER if any CP, SOB, nausea, dizziness, severe HA, changes vision/speech, left arm numbness and tingling and jaw pain. - TSH  2. Mixed hyperlipidemia  - Continue diet/meds, exercise,& lifestyle modifications. Continue monitor periodic cholesterol/liver & renal functions  - Lipid panel - TSH  3. Prediabetes  - Continue diet, exercise, lifestyle modifications. Monitor appropriate labs. - Hemoglobin A1c - Insulin, random  4. Vitamin D deficiency  - Continue supplementation. - VITAMIN D 25 Hydroxy   5. Fibromyalgia  6. OSA on CPAP   7. Medication management  - CBC with Differential/Platelet - BASIC METABOLIC PANEL WITH GFR - Hepatic function panel - Magnesium   Recommended regular exercise, BP monitoring, weight control,  and discussed med and SE's. Recommended labs to assess and monitor clinical status. Further disposition pending results of labs. Over 30 minutes of exam, counseling, chart review was performed

## 2016-01-11 LAB — BASIC METABOLIC PANEL WITH GFR
BUN: 14 mg/dL (ref 7–25)
CHLORIDE: 101 mmol/L (ref 98–110)
CO2: 22 mmol/L (ref 20–31)
Calcium: 9.6 mg/dL (ref 8.6–10.4)
Creat: 0.66 mg/dL (ref 0.50–1.05)
GFR, Est African American: >89 mL/min (ref >=60)
GFR, Est Non African American: >89 mL/min (ref >=60)
GLUCOSE: 89 mg/dL (ref 65–99)
POTASSIUM: 4.1 mmol/L (ref 3.5–5.3)
Sodium: 140 mmol/L (ref 135–146)

## 2016-01-11 LAB — HEPATIC FUNCTION PANEL
ALBUMIN: 4.2 g/dL (ref 3.6–5.1)
ALK PHOS: 85 U/L (ref 33–130)
ALT: 33 U/L — AB (ref 6–29)
AST: 27 U/L (ref 10–35)
Bilirubin, Direct: 0.2 mg/dL (ref <=0.2)
Indirect Bilirubin: 0.5 mg/dL (ref 0.2–1.2)
TOTAL PROTEIN: 6.9 g/dL (ref 6.1–8.1)
Total Bilirubin: 0.7 mg/dL (ref 0.2–1.2)

## 2016-01-11 LAB — MAGNESIUM: Magnesium: 2 mg/dL (ref 1.5–2.5)

## 2016-01-11 LAB — LIPID PANEL
Cholesterol: 143 mg/dL (ref 125–200)
HDL: 49 mg/dL (ref >=46)
LDL CALC: 69 mg/dL (ref <130)
Total CHOL/HDL Ratio: 2.9 Ratio (ref <=5.0)
Triglycerides: 125 mg/dL (ref <150)
VLDL: 25 mg/dL (ref <30)

## 2016-01-11 LAB — HEMOGLOBIN A1C
HEMOGLOBIN A1C: 5.8 % — AB (ref <5.7)
MEAN PLASMA GLUCOSE: 120 mg/dL

## 2016-01-11 LAB — INSULIN, RANDOM: INSULIN: 23.8 u[IU]/mL — AB (ref 2.0–19.6)

## 2016-01-11 LAB — VITAMIN D 25 HYDROXY (VIT D DEFICIENCY, FRACTURES): Vit D, 25-Hydroxy: 58 ng/mL (ref 30–100)

## 2016-04-06 ENCOUNTER — Encounter: Payer: Self-pay | Admitting: Internal Medicine

## 2016-04-29 ENCOUNTER — Ambulatory Visit (INDEPENDENT_AMBULATORY_CARE_PROVIDER_SITE_OTHER): Payer: Medicare Other | Admitting: Internal Medicine

## 2016-04-29 ENCOUNTER — Encounter: Payer: Self-pay | Admitting: Internal Medicine

## 2016-04-29 VITALS — BP 126/84 | HR 80 | Temp 97.7°F | Resp 16 | Ht 65.0 in | Wt 186.4 lb

## 2016-04-29 DIAGNOSIS — G4733 Obstructive sleep apnea (adult) (pediatric): Secondary | ICD-10-CM

## 2016-04-29 DIAGNOSIS — E559 Vitamin D deficiency, unspecified: Secondary | ICD-10-CM

## 2016-04-29 DIAGNOSIS — M797 Fibromyalgia: Secondary | ICD-10-CM

## 2016-04-29 DIAGNOSIS — I10 Essential (primary) hypertension: Secondary | ICD-10-CM | POA: Diagnosis not present

## 2016-04-29 DIAGNOSIS — Z0001 Encounter for general adult medical examination with abnormal findings: Secondary | ICD-10-CM

## 2016-04-29 DIAGNOSIS — Z23 Encounter for immunization: Secondary | ICD-10-CM | POA: Diagnosis not present

## 2016-04-29 DIAGNOSIS — Z0000 Encounter for general adult medical examination without abnormal findings: Secondary | ICD-10-CM

## 2016-04-29 DIAGNOSIS — Z136 Encounter for screening for cardiovascular disorders: Secondary | ICD-10-CM | POA: Diagnosis not present

## 2016-04-29 DIAGNOSIS — Z1212 Encounter for screening for malignant neoplasm of rectum: Secondary | ICD-10-CM

## 2016-04-29 DIAGNOSIS — Z9989 Dependence on other enabling machines and devices: Secondary | ICD-10-CM

## 2016-04-29 DIAGNOSIS — R7303 Prediabetes: Secondary | ICD-10-CM

## 2016-04-29 DIAGNOSIS — Z79899 Other long term (current) drug therapy: Secondary | ICD-10-CM

## 2016-04-29 DIAGNOSIS — E782 Mixed hyperlipidemia: Secondary | ICD-10-CM

## 2016-04-29 DIAGNOSIS — E538 Deficiency of other specified B group vitamins: Secondary | ICD-10-CM

## 2016-04-29 LAB — CBC WITH DIFFERENTIAL/PLATELET
BASOS PCT: 0 %
Basophils Absolute: 0 cells/uL (ref 0–200)
EOS PCT: 3 %
Eosinophils Absolute: 162 cells/uL (ref 15–500)
HCT: 42 % (ref 35.0–45.0)
Hemoglobin: 14.1 g/dL (ref 11.7–15.5)
LYMPHS PCT: 35 %
Lymphs Abs: 1890 cells/uL (ref 850–3900)
MCH: 30.7 pg (ref 27.0–33.0)
MCHC: 33.6 g/dL (ref 32.0–36.0)
MCV: 91.3 fL (ref 80.0–100.0)
MONOS PCT: 10 %
MPV: 10.1 fL (ref 7.5–12.5)
Monocytes Absolute: 540 cells/uL (ref 200–950)
NEUTROS ABS: 2808 {cells}/uL (ref 1500–7800)
Neutrophils Relative %: 52 %
PLATELETS: 330 10*3/uL (ref 140–400)
RBC: 4.6 MIL/uL (ref 3.80–5.10)
RDW: 13.8 % (ref 11.0–15.0)
WBC: 5.4 10*3/uL (ref 3.8–10.8)

## 2016-04-29 LAB — HEMOGLOBIN A1C
Hgb A1c MFr Bld: 5.5 % (ref <5.7)
MEAN PLASMA GLUCOSE: 111 mg/dL

## 2016-04-29 LAB — HEPATIC FUNCTION PANEL
ALBUMIN: 4.4 g/dL (ref 3.6–5.1)
ALK PHOS: 87 U/L (ref 33–130)
ALT: 33 U/L — ABNORMAL HIGH (ref 6–29)
AST: 27 U/L (ref 10–35)
BILIRUBIN DIRECT: 0.1 mg/dL (ref <=0.2)
BILIRUBIN TOTAL: 0.5 mg/dL (ref 0.2–1.2)
Indirect Bilirubin: 0.4 mg/dL (ref 0.2–1.2)
Total Protein: 7 g/dL (ref 6.1–8.1)

## 2016-04-29 LAB — BASIC METABOLIC PANEL WITH GFR
BUN: 14 mg/dL (ref 7–25)
CHLORIDE: 103 mmol/L (ref 98–110)
CO2: 23 mmol/L (ref 20–31)
CREATININE: 0.71 mg/dL (ref 0.50–1.05)
Calcium: 9.5 mg/dL (ref 8.6–10.4)
GFR, Est Non African American: >89 mL/min (ref >=60)
Glucose, Bld: 98 mg/dL (ref 65–99)
POTASSIUM: 4.2 mmol/L (ref 3.5–5.3)
SODIUM: 140 mmol/L (ref 135–146)

## 2016-04-29 LAB — LIPID PANEL
CHOL/HDL RATIO: 3.3 ratio (ref <5.0)
Cholesterol: 178 mg/dL (ref <200)
HDL: 54 mg/dL (ref >50)
LDL Cholesterol: 103 mg/dL — ABNORMAL HIGH
TRIGLYCERIDES: 105 mg/dL (ref <150)
VLDL: 21 mg/dL (ref <30)

## 2016-04-29 LAB — VITAMIN B12: VITAMIN B 12: 524 pg/mL (ref 200–1100)

## 2016-04-29 LAB — TSH: TSH: 1.53 m[IU]/L

## 2016-04-29 LAB — MAGNESIUM: MAGNESIUM: 2 mg/dL (ref 1.5–2.5)

## 2016-04-29 NOTE — Progress Notes (Signed)
[facility] Unk Pinto, M.D.    Uvaldo Bristle. Silverio Lay, P.A.-C      Starlyn Skeans, P.A.-C  [facility]                [address] [place], [place] [id number] Telephone [phone] Telefax [phone]  Annual Screening/Preventative Visit & Comprehensive Evaluation &  Examination     This very nice 60 y.o. MWF presents for a Screening/Preventative Visit & comprehensive evaluation and management of multiple medical co-morbidities.  Patient has been followed for HTN, Prediabetes, Hyperlipidemia and Vitamin D Deficiency.Patient has been on CPAP (10 cm) for OSA since 2008 and reports ongoing improved sleep hygiene and no more daytime excess somnolence.      Patient has been on SS Disability since age 47 yo in 2007 for Panic/Anxiety Atacks & Depression and had hx/o hospitalization in 2004 with a conversion reaction("Pseudo CVA/TIA"). Since on Disability , she seems to be doing well.       Labile HTN predates since 2004, altho treatment was only started in 2014. Patient's BP has been controlled at home and patient denies any cardiac symptoms as chest pain, palpitations, shortness of breath, dizziness or ankle swelling. Today's BP is  126/84.      Patient's hyperlipidemia is controlled with diet and medications. Patient denies myalgias or other medication SE's. Last lipids were at goal: Lab Results  Component Value Date   CHOL 143 01/10/2016   HDL 49 01/10/2016   LDLCALC 69 01/10/2016   TRIG 125 01/10/2016   CHOLHDL 2.9 01/10/2016      Patient has prediabetes predating since 2010 with A1c 5.7% and patient denies reactive hypoglycemic symptoms, visual blurring, diabetic polys, or paresthesias.  Patient's BMI is 31+ and her last A1c was still not at goal: Lab Results  Component Value Date   HGBA1C 5.8 (H) 01/10/2016      Finally, patient has history of Vitamin D Deficiency in 2008 of "23" and last Vitamin D  was near goal: Lab Results  Component Value Date   VD25OH 58 01/10/2016   Current Outpatient Prescriptions on File Prior to Visit  Medication Sig  . acetaminophen (TYLENOL) 500 MG tablet Take 500-1,000 mg by mouth every 6 (six) hours as needed for pain.  Marland Kitchen acyclovir (ZOVIRAX) 400 MG tablet Take 1 tablet (400 mg total) by mouth 3 (three) times daily.  Marland Kitchen acyclovir ointment (ZOVIRAX) 5 % Apply thin layer to affected area  . aspirin 81 MG tablet Take 81 mg by mouth daily.  . Cetirizine HCl 10 MG CAPS Take 1 capsule by mouth daily.  . Cholecalciferol (VITAMIN D PO) Take 6,000 Int'l Units by mouth daily.  . clonazePAM (KLONOPIN) 1 MG tablet Take 1 mg by mouth at bedtime.  Marland Kitchen glucose blood (ACCU-CHEK AVIVA PLUS) test strip USE AS DIRECTED DAILY  . hydrochlorothiazide (HYDRODIURIL) 25 MG tablet TAKE 1 TABLET BY MOUTH EVERY MORNING FOR BLOOD PRESSURE AND FLUID RETENTION  . Ibuprofen 200 MG CAPS Take 1 capsule by mouth. PRN  . lamoTRIgine (LAMICTAL) 150 MG tablet Take 150 mg by mouth at bedtime.  . Lancets (ACCU-CHEK MULTICLIX) lancets Use to check blood glucose daily  . Lysine 500 MG TABS Take by mouth daily. Currently taking 3 pill QD  . Magnesium 250 MG TABS Take 250 mg by mouth. Takes 5 pills per day  . meloxicam (MOBIC) 15 MG tablet  Take 1 tablet (15 mg total) by mouth daily.  . Multiple Vitamin (MULTIVITAMIN WITH MINERALS) TABS tablet Take 0.5 tablets by mouth 2 (two) times daily.  . naproxen sodium (ANAPROX) 220 MG tablet Take 220 mg by mouth 2 (two) times daily with a meal. PRN  . Omega-3 Fatty Acids (FISH OIL) 1000 MG CAPS Take by mouth daily.  Marland Kitchen OVER THE COUNTER MEDICATION 2 tablets daily. OsteoBioFlex  . QUEtiapine (SEROQUEL) 100 MG tablet Take 100 mg by mouth 3 (three) times daily.  . rosuvastatin (CRESTOR) 40 MG tablet TAKE 1 TABLET BY MOUTH DAILY  . traMADol (ULTRAM) 50 MG tablet Take 1 tablet (50 mg total) by mouth every 6 (six) hours as needed.  Marland Kitchen ZOSTAVAX 91478 UNT/0.65ML injection  ADM 0.65ML [place] UTD   Allergies  Allergen Reactions  . Latex     Band aids= if left on for extended period of time  . Celecoxib     REACTION: Rash (celebrex)  . Cephalexin     REACTION: Rash (keflex)  . Codeine     REACTION: Rash  . Erythromycin     REACTION: Rash (emycin)  . Sulfonamide Derivatives     REACTION: Rash  . Trazodone And Nefazodone     insomnia  . 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 Arthritis   . Depression   . Elevated hemoglobin A1c   . Fibromyalgia   . Hypertension   . Other abnormal glucose 08/09/2013  . Sleep apnea    cpap   > 4 yrs  last sleep study  . Unspecified essential hypertension 08/09/2013  . Unspecified vitamin D deficiency 08/09/2013  . Vitamin D deficiency    Health Maintenance  Topic Date Due  . PAP SMEAR  01/01/1978  . INFLUENZA VACCINE  01/21/2016  . MAMMOGRAM  07/23/2017  . COLONOSCOPY  12/11/2017  . TETANUS/TDAP  07/04/2025  . Hepatitis C Screening  Completed  . HIV Screening  Completed   Immunization History  Administered Date(s) Administered  . DT 07/05/2015  . Influenza Split 04/06/2012, 04/05/2013, 05/16/2014, 03/21/2015  . Pneumococcal Conjugate-13 05/16/2014  . Pneumococcal-Unspecified 06/22/1998  . Td 06/22/2004  . Zoster 10/21/2015   Past Surgical History:  Procedure Laterality Date  . ABDOMINAL HYSTERECTOMY    . JOINT REPLACEMENT     arthroscopy  . TOTAL KNEE ARTHROPLASTY Right 04/14/2013   Procedure: RIGHT TOTAL KNEE ARTHROPLASTY AND LEFT KNEE INJECTION ;  Surgeon: Alta Corning, MD;  Location: [facility];  Service: Orthopedics;  Laterality: Right;   Family History  Problem Relation Age of Onset  . Hypertension Mother   . Hypertension Father   . COPD Father   . Cancer Father     thyroid  . CVA Maternal Grandmother    Social History  Substance Use Topics  . Smoking status: Never Smoker  . Smokeless tobacco: Not on file  . Alcohol use No     ROS Constitutional: Denies fever, chills, weight loss/gain, headaches, insomnia,  night sweats, and change in appetite. Does c/o fatigue. Eyes: Denies redness, blurred vision, diplopia, discharge, itchy, watery eyes.  ENT: Denies discharge, congestion, post nasal drip, epistaxis, sore throat, earache, hearing loss, dental pain, Tinnitus, Vertigo, Sinus pain, snoring.  Cardio: Denies chest pain, palpitations, irregular heartbeat, syncope, dyspnea, diaphoresis, orthopnea, PND, claudication, edema Respiratory: denies cough, dyspnea, DOE, pleurisy, hoarseness, laryngitis, wheezing.  Gastrointestinal: Denies dysphagia, heartburn, reflux, water brash, pain, cramps, nausea, vomiting, bloating, diarrhea,  constipation, hematemesis, melena, hematochezia, jaundice, hemorrhoids Genitourinary: Denies dysuria, frequency, urgency, nocturia, hesitancy, discharge, hematuria, flank pain Breast: Breast lumps, nipple discharge, bleeding.  Musculoskeletal: Denies arthralgia, myalgia, stiffness, Jt. Swelling, pain, limp, and strain/sprain. Denies falls. Skin: Denies puritis, rash, hives, warts, acne, eczema, changing in skin lesion Neuro: No weakness, tremor, incoordination, spasms, paresthesia, pain Psychiatric: Denies confusion, memory loss, sensory loss. Denies Depression. Endocrine: Denies change in weight, skin, hair change, nocturia, and paresthesia, diabetic polys, visual blurring, hyper / hypo glycemic episodes.  Heme/Lymph: No excessive bleeding, bruising, enlarged lymph nodes.  Physical Exam  BP 126/84   Pulse 80   Temp 97.7 F (36.5 C)   Resp 16   Ht 5\' 5"  (1.651 m)   Wt 186 lb 6.4 oz (84.6 kg)   BMI 31.02 kg/m   General Appearance: Well nourished and in no apparent distress.  Eyes: PERRLA, EOMs, conjunctiva no swelling or erythema, normal fundi and vessels. Sinuses: No frontal/maxillary tenderness ENT/Mouth: EACs patent / TMs  nl. Nares clear without erythema, swelling, mucoid exudates. Oral  hygiene is good. No erythema, swelling, or exudate. Tongue normal, non-obstructing. Tonsils not swollen or erythematous. Hearing normal.  Neck: Supple, thyroid normal. No bruits, nodes or JVD. Respiratory: Respiratory effort normal.  BS equal and clear bilateral without rales, rhonci, wheezing or stridor. Cardio: Heart sounds are normal with regular rate and rhythm and no murmurs, rubs or gallops. Peripheral pulses are normal and equal bilaterally without edema. No aortic or femoral bruits. Chest: symmetric with normal excursions and percussion. Breasts: Symmetric, without lumps, nipple discharge, retractions, or fibrocystic changes.  Abdomen: Flat, soft with bowel sounds active. Nontender, no guarding, rebound, hernias, masses, or organomegaly.  Lymphatics: Non tender without lymphadenopathy.  Genitourinary:  Musculoskeletal: Full ROM all peripheral extremities, joint stability, 5/5 strength, and normal gait. Skin: Warm and dry without rashes, lesions, cyanosis, clubbing or  ecchymosis.  Neuro: Cranial nerves intact, reflexes equal bilaterally. Normal muscle tone, no cerebellar symptoms. Sensation intact.  Pysch: Alert and oriented X 3, normal affect, Insight and Judgment appropriate.   Assessment and Plan  1. Annual Preventative Screening Examination  - Microalbumin / creatinine urine ratio - EKG 12-Lead - POC Hemoccult Bld/Stl - Urinalysis, Routine w reflex microscopic  - Vitamin B12 - CBC with Differential/Platelet - BASIC METABOLIC PANEL WITH GFR - Hepatic function panel - Magnesium - Lipid panel - TSH - Hemoglobin A1c - Insulin, random - VITAMIN D 25 Hydroxy   2. Essential hypertension  - Microalbumin / creatinine urine ratio - EKG 12-Lead - TSH  3. Mixed hyperlipidemia  - EKG 12-Lead - Lipid panel - TSH  4. Prediabetes  - EKG 12-Lead - Hemoglobin A1c - Insulin, random  5. Vitamin D deficiency  - VITAMIN D 25 Hydroxy   6. B12 deficiency  - Vitamin  B12  7. OSA on CPAP   8. Fibromyalgia   9. Screening for rectal cancer  - POC Hemoccult Bld/Stl   10. Need for prophylactic vaccination and inoculation against influenza  - Flu Vaccine QUAD with presevative  11. Screening for ischemic heart disease  - EKG 12-Lead  12. Medication management  - Urinalysis, Routine w reflex microscopic  - CBC with Differential/Platelet - BASIC METABOLIC PANEL WITH GFR - Hepatic function panel - Magnesium      Continue prudent diet as discussed, weight control, BP monitoring, regular exercise, and medications. Discussed med's effects and SE's. Screening labs and tests as requested with regular follow-up as recommended. Over 40 minutes of exam, counseling,  chart review and high complex critical decision making was performed.

## 2016-04-29 NOTE — Patient Instructions (Signed)

## 2016-04-30 LAB — URINALYSIS, ROUTINE W REFLEX MICROSCOPIC
Bilirubin Urine: NEGATIVE
Glucose, UA: NEGATIVE
HGB URINE DIPSTICK: NEGATIVE
Ketones, ur: NEGATIVE
LEUKOCYTES UA: NEGATIVE
NITRITE: NEGATIVE
PROTEIN: NEGATIVE
Specific Gravity, Urine: 1.013 (ref 1.001–1.035)
pH: 7 (ref 5.0–8.0)

## 2016-04-30 LAB — MICROALBUMIN / CREATININE URINE RATIO
CREATININE, URINE: 84 mg/dL (ref 20–320)
MICROALB UR: 0.3 mg/dL
MICROALB/CREAT RATIO: 4 ug/mg{creat} (ref <30)

## 2016-04-30 LAB — INSULIN, RANDOM: Insulin: 31.4 u[IU]/mL — ABNORMAL HIGH (ref 2.0–19.6)

## 2016-04-30 LAB — VITAMIN D 25 HYDROXY (VIT D DEFICIENCY, FRACTURES): Vit D, 25-Hydroxy: 75 ng/mL (ref 30–100)

## 2016-07-24 ENCOUNTER — Other Ambulatory Visit: Payer: Self-pay

## 2016-07-24 DIAGNOSIS — Z0001 Encounter for general adult medical examination with abnormal findings: Secondary | ICD-10-CM

## 2016-07-24 DIAGNOSIS — Z1212 Encounter for screening for malignant neoplasm of rectum: Secondary | ICD-10-CM

## 2016-07-24 LAB — POC HEMOCCULT BLD/STL (HOME/3-CARD/SCREEN)
FECAL OCCULT BLD: NEGATIVE
FECAL OCCULT BLD: NEGATIVE
FECAL OCCULT BLD: NEGATIVE

## 2016-08-12 ENCOUNTER — Ambulatory Visit (INDEPENDENT_AMBULATORY_CARE_PROVIDER_SITE_OTHER): Payer: Medicare Other | Admitting: Internal Medicine

## 2016-08-12 ENCOUNTER — Encounter: Payer: Self-pay | Admitting: Internal Medicine

## 2016-08-12 ENCOUNTER — Ambulatory Visit: Payer: Self-pay | Admitting: Internal Medicine

## 2016-08-12 VITALS — BP 128/82 | HR 95 | Temp 97.9°F | Resp 16 | Wt 193.0 lb

## 2016-08-12 DIAGNOSIS — Z6830 Body mass index (BMI) 30.0-30.9, adult: Secondary | ICD-10-CM

## 2016-08-12 DIAGNOSIS — I10 Essential (primary) hypertension: Secondary | ICD-10-CM

## 2016-08-12 DIAGNOSIS — Z9989 Dependence on other enabling machines and devices: Secondary | ICD-10-CM

## 2016-08-12 DIAGNOSIS — M170 Bilateral primary osteoarthritis of knee: Secondary | ICD-10-CM

## 2016-08-12 DIAGNOSIS — F329 Major depressive disorder, single episode, unspecified: Secondary | ICD-10-CM

## 2016-08-12 DIAGNOSIS — Z789 Other specified health status: Secondary | ICD-10-CM

## 2016-08-12 DIAGNOSIS — M797 Fibromyalgia: Secondary | ICD-10-CM

## 2016-08-12 DIAGNOSIS — G4733 Obstructive sleep apnea (adult) (pediatric): Secondary | ICD-10-CM

## 2016-08-12 DIAGNOSIS — Z79899 Other long term (current) drug therapy: Secondary | ICD-10-CM

## 2016-08-12 DIAGNOSIS — R7303 Prediabetes: Secondary | ICD-10-CM

## 2016-08-12 DIAGNOSIS — E782 Mixed hyperlipidemia: Secondary | ICD-10-CM | POA: Diagnosis not present

## 2016-08-12 DIAGNOSIS — F32A Depression, unspecified: Secondary | ICD-10-CM

## 2016-08-12 DIAGNOSIS — E559 Vitamin D deficiency, unspecified: Secondary | ICD-10-CM

## 2016-08-12 DIAGNOSIS — R6889 Other general symptoms and signs: Secondary | ICD-10-CM

## 2016-08-12 DIAGNOSIS — Z9181 History of falling: Secondary | ICD-10-CM

## 2016-08-12 DIAGNOSIS — Z0000 Encounter for general adult medical examination without abnormal findings: Secondary | ICD-10-CM

## 2016-08-12 DIAGNOSIS — Z0001 Encounter for general adult medical examination with abnormal findings: Secondary | ICD-10-CM

## 2016-08-12 DIAGNOSIS — E669 Obesity, unspecified: Secondary | ICD-10-CM

## 2016-08-12 LAB — BASIC METABOLIC PANEL WITH GFR
BUN: 16 mg/dL (ref 7–25)
CHLORIDE: 103 mmol/L (ref 98–110)
CO2: 26 mmol/L (ref 20–31)
Calcium: 9.6 mg/dL (ref 8.6–10.4)
Creat: 0.77 mg/dL (ref 0.50–1.05)
GFR, Est African American: >89 mL/min (ref >=60)
GFR, Est Non African American: 85 mL/min (ref >=60)
Glucose, Bld: 97 mg/dL (ref 65–99)
POTASSIUM: 3.8 mmol/L (ref 3.5–5.3)
Sodium: 140 mmol/L (ref 135–146)

## 2016-08-12 LAB — CBC WITH DIFFERENTIAL/PLATELET
BASOS ABS: 0 {cells}/uL (ref 0–200)
BASOS PCT: 0 %
EOS ABS: 159 {cells}/uL (ref 15–500)
Eosinophils Relative: 3 %
HCT: 42.1 % (ref 35.0–45.0)
HEMOGLOBIN: 14 g/dL (ref 11.7–15.5)
LYMPHS ABS: 1855 {cells}/uL (ref 850–3900)
Lymphocytes Relative: 35 %
MCH: 30 pg (ref 27.0–33.0)
MCHC: 33.3 g/dL (ref 32.0–36.0)
MCV: 90.3 fL (ref 80.0–100.0)
MONO ABS: 530 {cells}/uL (ref 200–950)
MONOS PCT: 10 %
MPV: 10 fL (ref 7.5–12.5)
NEUTROS ABS: 2756 {cells}/uL (ref 1500–7800)
Neutrophils Relative %: 52 %
PLATELETS: 305 10*3/uL (ref 140–400)
RBC: 4.66 MIL/uL (ref 3.80–5.10)
RDW: 13.9 % (ref 11.0–15.0)
WBC: 5.3 10*3/uL (ref 3.8–10.8)

## 2016-08-12 LAB — LIPID PANEL
CHOL/HDL RATIO: 2.9 ratio (ref <5.0)
Cholesterol: 152 mg/dL (ref <200)
HDL: 53 mg/dL (ref >50)
LDL Cholesterol: 78 mg/dL (ref <100)
Triglycerides: 103 mg/dL (ref <150)
VLDL: 21 mg/dL (ref <30)

## 2016-08-12 LAB — HEPATIC FUNCTION PANEL
ALBUMIN: 4.3 g/dL (ref 3.6–5.1)
ALK PHOS: 95 U/L (ref 33–130)
ALT: 33 U/L — AB (ref 6–29)
AST: 23 U/L (ref 10–35)
BILIRUBIN DIRECT: 0.1 mg/dL (ref <=0.2)
BILIRUBIN TOTAL: 0.6 mg/dL (ref 0.2–1.2)
Indirect Bilirubin: 0.5 mg/dL (ref 0.2–1.2)
Total Protein: 7.2 g/dL (ref 6.1–8.1)

## 2016-08-12 LAB — HEMOGLOBIN A1C
HEMOGLOBIN A1C: 5.5 % (ref <5.7)
MEAN PLASMA GLUCOSE: 111 mg/dL

## 2016-08-12 LAB — TSH: TSH: 1.43 m[IU]/L

## 2016-08-12 NOTE — Progress Notes (Signed)
MEDICARE ANNUAL WELLNESS VISIT AND FOLLOW UP Assessment:    1. Medication management  - CBC with Differential/Platelet - BASIC METABOLIC PANEL WITH GFR - Hepatic function panel  2. Mixed hyperlipidemia -not diabetic  -LDL at goal - Lipid panel  3. Essential hypertension -cont medications -well controlled -diet and exercise -monitor at home - TSH  5. Prediabetes A1c  6. OSA on CPAP -cont CPAP use  7. Primary osteoarthritis of both knees -tramadol as needed -try to avoid excessive NSAID use -can supplement with tylenol  8. At low risk for fall -negative fall screen  9. BMI 30.0-30.9,adult -cont diet and exercise  10. Depression, controlled -negative depression screen -cont current medications  11. Fibromyalgia -cont stretching and exercise as tolerated  12. Vitamin D deficiency -cont Vit d supplement  14. Medicare annual wellness visit, subsequent -due next year  81.  Allergic rhinitis -tessalon -flonase -daily anthistamine -nasal saline as often as tolerated  Over 30 minutes of exam, counseling, chart review, and critical decision making was performed  Future Appointments Date Time Provider [place]  11/11/2016 9:30 AM Unk Pinto, MD [facility] None  05/12/2017 2:00 PM Unk Pinto, MD [facility] None     Plan:   During the course of the visit the patient was educated and counseled about appropriate screening and preventive services including:    Pneumococcal vaccine   Influenza vaccine  Prevnar 13  Td vaccine  Screening electrocardiogram  Colorectal cancer screening  Diabetes screening  Glaucoma screening  Nutrition counseling    Subjective:  [name] is a 61 y.o. female who presents for Medicare Annual Wellness Visit and 3 month follow up for HTN, hyperlipidemia, prediabetes, and vitamin D Def.   Her blood pressure has been controlled at home, today their BP is BP: 128/82 She does not workout. She  denies chest pain, shortness of breath, dizziness.  She is not on cholesterol medication and denies myalgias. Her cholesterol is at goal. The cholesterol last visit was:   Lab Results  Component Value Date   CHOL 152 08/12/2016   HDL 53 08/12/2016   LDLCALC 78 08/12/2016   TRIG 103 08/12/2016   CHOLHDL 2.9 08/12/2016  She has never been on crestor in the past.  She reports that she takes it MWF.  No side effects from medication.   Patient has a history of prediabetes with related CKD 2.  She is currently well controlled with diet.  She does try to exercise as well, but sometimes can't due to her fibromyalgia.   :  Lab Results  Component Value Date   HGBA1C 5.5 08/12/2016   Last GFR Lab Results  Component Value Date   GFRNONAA 85 08/12/2016     Lab Results  Component Value Date   GFRAA >89 08/12/2016   Patient is on Vitamin D supplement.   Lab Results  Component Value Date   VD25OH 55 04/29/2016     She has been having a dry cough which has been bad in the afternoon.  She has no nasal congestion.  She has had some eye watering.  She has no sneezing.  She does have some ear congestion.  She has a tickle in her throat.  She reports that she tried to take tessalon but it expired.     Medication Review: Current Outpatient Prescriptions on File Prior to Visit  Medication Sig Dispense Refill  . acetaminophen (TYLENOL) 500 MG tablet Take 500-1,000 mg by mouth every 6 (six) hours as needed for  pain.    . acyclovir (ZOVIRAX) 400 MG tablet Take 1 tablet (400 mg total) by mouth 3 (three) times daily. 21 tablet 2  . acyclovir ointment (ZOVIRAX) 5 % Apply thin layer to affected area 15 g 0  . aspirin 81 MG tablet Take 81 mg by mouth daily.    . Cetirizine HCl 10 MG CAPS Take 1 capsule by mouth daily.    . Cholecalciferol (VITAMIN D PO) Take 6,000 Int'l Units by mouth daily.    . clonazePAM (KLONOPIN) 1 MG tablet Take 1 mg by mouth at bedtime.    Marland Kitchen glucose blood (ACCU-CHEK AVIVA PLUS)  test strip USE AS DIRECTED DAILY 100 each 1  . hydrochlorothiazide (HYDRODIURIL) 25 MG tablet TAKE 1 TABLET BY MOUTH EVERY MORNING FOR BLOOD PRESSURE AND FLUID RETENTION 90 tablet 1  . Ibuprofen 200 MG CAPS Take 1 capsule by mouth. PRN    . lamoTRIgine (LAMICTAL) 150 MG tablet Take 150 mg by mouth at bedtime.    . Lancets (ACCU-CHEK MULTICLIX) lancets Use to check blood glucose daily 100 each PRN  . Lysine 500 MG TABS Take by mouth daily. Currently taking 3 pill QD    . Magnesium 250 MG TABS Take 250 mg by mouth. Takes 5 pills per day    . meloxicam (MOBIC) 15 MG tablet Take 1 tablet (15 mg total) by mouth daily. 90 tablet 99  . Multiple Vitamin (MULTIVITAMIN WITH MINERALS) TABS tablet Take 0.5 tablets by mouth 2 (two) times daily.    . naproxen sodium (ANAPROX) 220 MG tablet Take 220 mg by mouth 2 (two) times daily with a meal. PRN    . OVER THE COUNTER MEDICATION 2 tablets daily. OsteoBioFlex    . QUEtiapine (SEROQUEL) 100 MG tablet Take 100 mg by mouth 3 (three) times daily.    . traMADol (ULTRAM) 50 MG tablet Take 1 tablet (50 mg total) by mouth every 6 (six) hours as needed. 30 tablet 0  . ZOSTAVAX 91478 UNT/0.65ML injection ADM 0.65ML [place] UTD  0   No current facility-administered medications on file prior to visit.     Allergies: Allergies  Allergen Reactions  . Latex     Band aids= if left on for extended period of time  . Celecoxib     REACTION: Rash (celebrex)  . Cephalexin     REACTION: Rash (keflex)  . Codeine     REACTION: Rash  . Erythromycin     REACTION: Rash (emycin)  . Sulfonamide Derivatives     REACTION: Rash  . Trazodone And Nefazodone     insomnia  . Venlafaxine     REACTION: Blurred Vision Insurance claims handler)    Current Problems (verified) has Essential hypertension; Mixed hyperlipidemia; Prediabetes; Vitamin D deficiency; Medication management; Fibromyalgia; Depression, controlled; Obesity (BMI 30.5); OSA on CPAP; At low risk for fall; Osteoarthritis of both knees;  BMI 30.0-30.9,adult; and Medicare annual wellness visit, subsequent on her problem list.  Screening Tests Immunization History  Administered Date(s) Administered  . DT 07/05/2015  . Influenza Split 04/06/2012, 04/05/2013, 05/16/2014, 03/21/2015  . Influenza,inj,quad, With Preservative 04/29/2016  . Pneumococcal Conjugate-13 05/16/2014  . Pneumococcal-Unspecified 06/22/1998  . Td 06/22/2004  . Zoster 10/21/2015    Preventative care: Last colonoscopy: 2009 Mammogram:  2017 DEXA: 2013, due Pap:  Hysterectomy  Names of Other Physician/Practitioners you currently use: 1. [facility] here for primary care 2. Dr. Tye Savoy, eye doctor, last visit 2017 3. Dr. Marlou Sa, dentist, last visit 2017 Patient  Care Team: Unk Pinto, MD as PCP - General (Internal Medicine) Dorna Leitz, MD as Consulting Physician (Orthopedic Surgery) Milus Banister, MD as Attending Physician (Gastroenterology) Sheralyn Boatman, MD as Consulting Physician (Psychiatry) [name], [place] (Optometry) [name], DDS as Referring Physician (Dentistry)  Surgical: She  has a past surgical history that includes Abdominal hysterectomy; Joint replacement; and Total knee arthroplasty (Right, 04/14/2013). Family Her family history includes COPD in her father; CVA in her maternal grandmother; Cancer in her father; Hypertension in her father and mother. Social history  She reports that she has never smoked. She has never used smokeless tobacco. She reports that she does not drink alcohol or use drugs.  MEDICARE WELLNESS OBJECTIVES: Physical activity: Current Exercise Habits: The patient does not participate in regular exercise at present Cardiac risk factors: Cardiac Risk Factors include: advanced age (>57men, >32 women);diabetes mellitus;dyslipidemia;hypertension;obesity (BMI >30kg/m2) Depression/mood screen:   Depression screen [facility] 2/9 08/12/2016  Decreased Interest 0  Down,  Depressed, Hopeless 0  PHQ - 2 Score 0  Altered sleeping -  Tired, decreased energy -  Change in appetite -  Feeling bad or failure about yourself  -  Trouble concentrating -  Moving slowly or fidgety/restless -  Suicidal thoughts -  PHQ-9 Score -    ADLs:  In your present state of health, do you have any difficulty performing the following activities: 08/12/2016 04/29/2016  Hearing? N N  Vision? Y N  Difficulty concentrating or making decisions? N N  Walking or climbing stairs? N N  Dressing or bathing? N N  Doing errands, shopping? N N  Preparing Food and eating ? N -  Using the Toilet? N -  In the past six months, have you accidently leaked urine? N -  Do you have problems with loss of bowel control? N -  Managing your Medications? N -  Managing your Finances? N -  Housekeeping or managing your Housekeeping? N -  Some recent data might be hidden     Cognitive Testing  Alert? Yes  Normal Appearance?Yes  Oriented to person? Yes  Place? Yes   Time? Yes  Recall of three objects?  Yes  Can perform simple calculations? Yes  Displays appropriate judgment?Yes  Can read the correct time from a watch face?Yes  EOL planning: Does Patient Have a Medical Advance Directive?: No Would patient like information on creating a medical advance directive?: Yes (MAU/Ambulatory/Procedural Areas - Information given)   Objective:   Today's Vitals   08/12/16 0906  BP: 128/82  Pulse: 95  Resp: 16  Temp: 97.9 F (36.6 C)  SpO2: 95%  Weight: 193 lb (87.5 kg)   Body mass index is 32.12 kg/m.  General appearance: alert, no distress, WD/WN, female HEENT: normocephalic, sclerae anicteric, TMs pearly, nares patent, no discharge or erythema, pharynx normal Oral cavity: MMM, no lesions Neck: supple, no lymphadenopathy, no thyromegaly, no masses Heart: RRR, normal S1, S2, no murmurs Lungs: CTA bilaterally, no wheezes, rhonchi, or rales Abdomen: +bs, soft, non tender, non distended, no  masses, no hepatomegaly, no splenomegaly Musculoskeletal: nontender, no swelling, no obvious deformity Extremities: no edema, no cyanosis, no clubbing Pulses: 2+ symmetric, upper and lower extremities, normal cap refill Neurological: alert, oriented x 3, CN2-12 intact, strength normal upper extremities and lower extremities, sensation normal throughout, DTRs 2+ throughout, no cerebellar signs, gait normal Psychiatric: normal affect, behavior normal, pleasant   Medicare Attestation I have personally reviewed: The patient's medical and social history Their use of alcohol, tobacco or  illicit drugs Their current medications and supplements The patient's functional ability including ADLs,fall risks, home safety risks, cognitive, and hearing and visual impairment Diet and physical activities Evidence for depression or mood disorders  The patient's weight, height, BMI, and visual acuity have been recorded in the chart.  I have made referrals, counseling, and provided education to the patient based on review of the above and I have provided the patient with a written personalized care plan for preventive services.     Starlyn Skeans, PA-C   08/15/2016

## 2016-08-13 MED ORDER — PREDNISONE 20 MG PO TABS
ORAL_TABLET | ORAL | 0 refills | Status: DC
Start: 2016-08-13 — End: 2016-11-11

## 2016-08-13 MED ORDER — FLUTICASONE PROPIONATE 50 MCG/ACT NA SUSP: 2.0000 | Freq: Every day | NASAL | 0 refills | Status: DC

## 2016-10-02 ENCOUNTER — Other Ambulatory Visit: Payer: Self-pay | Admitting: *Deleted

## 2016-10-02 MED ORDER — HYDROCHLOROTHIAZIDE 25 MG PO TABS: ORAL_TABLET | ORAL | 1 refills | Status: DC

## 2016-10-06 ENCOUNTER — Other Ambulatory Visit: Payer: Self-pay | Admitting: *Deleted

## 2016-10-06 MED ORDER — HYDROCHLOROTHIAZIDE 25 MG PO TABS: ORAL_TABLET | ORAL | 1 refills | Status: DC

## 2016-10-26 ENCOUNTER — Other Ambulatory Visit: Payer: Self-pay | Admitting: Internal Medicine

## 2016-10-26 DIAGNOSIS — Z1231 Encounter for screening mammogram for malignant neoplasm of breast: Secondary | ICD-10-CM

## 2016-11-11 ENCOUNTER — Ambulatory Visit (INDEPENDENT_AMBULATORY_CARE_PROVIDER_SITE_OTHER): Payer: Medicare Other | Admitting: Internal Medicine

## 2016-11-11 ENCOUNTER — Other Ambulatory Visit: Payer: Self-pay | Admitting: *Deleted

## 2016-11-11 ENCOUNTER — Encounter: Payer: Self-pay | Admitting: Internal Medicine

## 2016-11-11 VITALS — BP 122/84 | HR 92 | Temp 97.3°F | Resp 16 | Ht 65.0 in | Wt 193.8 lb

## 2016-11-11 DIAGNOSIS — E559 Vitamin D deficiency, unspecified: Secondary | ICD-10-CM

## 2016-11-11 DIAGNOSIS — I10 Essential (primary) hypertension: Secondary | ICD-10-CM | POA: Diagnosis not present

## 2016-11-11 DIAGNOSIS — E782 Mixed hyperlipidemia: Secondary | ICD-10-CM | POA: Diagnosis not present

## 2016-11-11 DIAGNOSIS — G4733 Obstructive sleep apnea (adult) (pediatric): Secondary | ICD-10-CM

## 2016-11-11 DIAGNOSIS — Z79899 Other long term (current) drug therapy: Secondary | ICD-10-CM | POA: Diagnosis not present

## 2016-11-11 DIAGNOSIS — Z9989 Dependence on other enabling machines and devices: Secondary | ICD-10-CM

## 2016-11-11 DIAGNOSIS — M797 Fibromyalgia: Secondary | ICD-10-CM

## 2016-11-11 DIAGNOSIS — R7303 Prediabetes: Secondary | ICD-10-CM

## 2016-11-11 LAB — BASIC METABOLIC PANEL WITH GFR
BUN: 12 mg/dL (ref 7–25)
CALCIUM: 9.7 mg/dL (ref 8.6–10.4)
CO2: 24 mmol/L (ref 20–31)
CREATININE: 0.72 mg/dL (ref 0.50–1.05)
Chloride: 103 mmol/L (ref 98–110)
GFR, Est Non African American: >89 mL/min (ref >=60)
GLUCOSE: 109 mg/dL — AB (ref 65–99)
POTASSIUM: 4.1 mmol/L (ref 3.5–5.3)
Sodium: 140 mmol/L (ref 135–146)

## 2016-11-11 LAB — CBC WITH DIFFERENTIAL/PLATELET
BASOS PCT: 0 %
Basophils Absolute: 0 cells/uL (ref 0–200)
EOS ABS: 232 {cells}/uL (ref 15–500)
Eosinophils Relative: 4 %
HCT: 42 % (ref 35.0–45.0)
Hemoglobin: 14.1 g/dL (ref 11.7–15.5)
Lymphocytes Relative: 35 %
Lymphs Abs: 2030 cells/uL (ref 850–3900)
MCH: 30.5 pg (ref 27.0–33.0)
MCHC: 33.6 g/dL (ref 32.0–36.0)
MCV: 90.9 fL (ref 80.0–100.0)
MONO ABS: 580 {cells}/uL (ref 200–950)
MONOS PCT: 10 %
MPV: 10.2 fL (ref 7.5–12.5)
NEUTROS ABS: 2958 {cells}/uL (ref 1500–7800)
Neutrophils Relative %: 51 %
PLATELETS: 328 10*3/uL (ref 140–400)
RBC: 4.62 MIL/uL (ref 3.80–5.10)
RDW: 13.9 % (ref 11.0–15.0)
WBC: 5.8 10*3/uL (ref 3.8–10.8)

## 2016-11-11 LAB — HEPATIC FUNCTION PANEL
ALBUMIN: 4.3 g/dL (ref 3.6–5.1)
ALT: 39 U/L — ABNORMAL HIGH (ref 6–29)
AST: 30 U/L (ref 10–35)
Alkaline Phosphatase: 93 U/L (ref 33–130)
BILIRUBIN TOTAL: 0.8 mg/dL (ref 0.2–1.2)
Bilirubin, Direct: 0.2 mg/dL (ref <=0.2)
Indirect Bilirubin: 0.6 mg/dL (ref 0.2–1.2)
TOTAL PROTEIN: 7.2 g/dL (ref 6.1–8.1)

## 2016-11-11 LAB — LIPID PANEL
CHOLESTEROL: 159 mg/dL (ref <200)
HDL: 47 mg/dL — AB (ref >50)
LDL CALC: 84 mg/dL (ref <100)
TRIGLYCERIDES: 138 mg/dL (ref <150)
Total CHOL/HDL Ratio: 3.4 Ratio (ref <5.0)
VLDL: 28 mg/dL (ref <30)

## 2016-11-11 MED ORDER — FLUTICASONE PROPIONATE 50 MCG/ACT NA SUSP: 2.0000 | Freq: Every day | NASAL | 2 refills | Status: DC

## 2016-11-11 NOTE — Progress Notes (Signed)
This very nice 61 y.o. MWF  presents for 3 month follow up with Hypertension, Hyperlipidemia, Pre-Diabetes and Vitamin D Deficiency. Patient also has OSA on CPAP (10 cm) and reports compliance and continued benefit wit restful restorative sleep and no daytime sleepiness.      Since 2007 at age 33 yo, patient has been on SS Disability for Panic/Anxiety  And Depression     Patient is treated for HTN & BP has been controlled at home. Today's BP is at goal - 122/84. Patient has had no complaints of any cardiac type chest pain, palpitations, dyspnea, orthopnea, PND, dizziness, claudication, or dependent edema.     Hyperlipidemia is controlled with diet & meds. Patient denies myalgias or other med SE's. Last Lipids were at goal: Lab Results  Component Value Date   CHOL 152 08/12/2016   HDL 53 08/12/2016   LDLCALC 78 08/12/2016   TRIG 103 08/12/2016   CHOLHDL 2.9 08/12/2016      Also, the patient has history of Morbid Obesity (BMI 32+) and PreDiabetes (A1c 5.7% in 2010)  and has had no symptoms of reactive hypoglycemia, diabetic polys, paresthesias or visual blurring.  Last A1c was at goal: Lab Results  Component Value Date   HGBA1C 5.5 08/12/2016      Further, the patient also has history of Vitamin D Deficiency ()"23" in 2008) and supplements vitamin D without any suspected side-effects. Last vitamin D was at goal: Lab Results  Component Value Date   VD25OH 75 04/29/2016   Current Outpatient Prescriptions on File Prior to Visit  Medication Sig  . acetaminophen  500 MG tablet Take 1-2 tabsevery 6  hrs as needed for pain.  Marland Kitchen acyclovir  400 MG tablet Take 1 tab 3 x daily.  Marland Kitchen aspirin 81 MG tablet Take 81 mg by mouth daily.  . Cetirizine HCl 10 MG CAPS Take 1 capsule by mouth daily.  Marland Kitchen VITAMIN D Take 8,000 Int'l Units by mouth daily.   . clonazePAM  1 MG tablet Take 1 mg by mouth at bedtime.  . hctz 25 MG tablet TAKE 1 TAB EVERY MORNING   . Ibuprofen 200 MG CAPS Take 1 capsule by mouth.  PRN  . lamoTRIgine  150 MG tablet Take 150 mg by mouth at bedtime.  Marland Kitchen Lysine 500 MG TABS Take by mouth daily. Currently taking 3 pill QD  . Magnesium 250 MG TABS Takes 5 pills per day  . meloxicam  15 MG tablet Take 1 tab daily.  . Multi-Vit w/Min Take 0.5 tablets by mouth 2 (two) times daily.  . naproxen  220 MG tablet Take 2  times daily with a meal. PRN  .  OsteoBioFlex 2 tablets daily.  . QUEtiapine  100 MG tablet Take 100 mg by mouth 3 (three) times daily.  . traMADol  50 MG tablet Take 1 tab every 6  hours as needed.   Allergies  Allergen Reactions  . Latex     Band aids= if left on for extended period of time  . Celecoxib     REACTION: Rash (celebrex)  . Cephalexin     REACTION: Rash (keflex)  . Codeine     REACTION: Rash  . Erythromycin     REACTION: Rash (emycin)  . Sulfonamide Derivatives     REACTION: Rash  . Trazodone And Nefazodone     insomnia  . Venlafaxine     REACTION: Blurred Vision Insurance claims handler)   PMHx:   Past Medical History:  Diagnosis Date  . Arthritis   . Depression   . Elevated hemoglobin A1c   . Fibromyalgia   . Hypertension   . Other abnormal glucose 08/09/2013  . Sleep apnea    cpap   > 4 yrs  last sleep study  . Unspecified essential hypertension 08/09/2013  . Unspecified vitamin D deficiency 08/09/2013  . Vitamin D deficiency    Immunization History  Administered Date(s) Administered  . DT 07/05/2015  . Influenza Split 04/06/2012, 04/05/2013, 05/16/2014, 03/21/2015  . Influenza,inj,quad, With Preservative 04/29/2016  . Pneumococcal Conjugate-13 05/16/2014  . Pneumococcal-Unspecified 06/22/1998  . Td 06/22/2004  . Zoster 10/21/2015   Past Surgical History:  Procedure Laterality Date  . ABDOMINAL HYSTERECTOMY    . JOINT REPLACEMENT     arthroscopy  . TOTAL KNEE ARTHROPLASTY Right 04/14/2013   Procedure: RIGHT TOTAL KNEE ARTHROPLASTY AND LEFT KNEE INJECTION ;  Surgeon: Alta Corning, MD;  Location: [facility];  Service: Orthopedics;   Laterality: Right;   FHx:    Reviewed / unchanged  SHx:    Reviewed / unchanged  Systems Review:  Constitutional: Denies fever, chills, wt changes, headaches, insomnia, fatigue, night sweats, change in appetite. Eyes: Denies redness, blurred vision, diplopia, discharge, itchy, watery eyes.  ENT: Denies discharge, congestion, post nasal drip, epistaxis, sore throat, earache, hearing loss, dental pain, tinnitus, vertigo, sinus pain, snoring.  CV: Denies chest pain, palpitations, irregular heartbeat, syncope, dyspnea, diaphoresis, orthopnea, PND, claudication or edema. Respiratory: denies cough, dyspnea, DOE, pleurisy, hoarseness, laryngitis, wheezing.  Gastrointestinal: Denies dysphagia, odynophagia, heartburn, reflux, water brash, abdominal pain or cramps, nausea, vomiting, bloating, diarrhea, constipation, hematemesis, melena, hematochezia  or hemorrhoids. Genitourinary: Denies dysuria, frequency, urgency, nocturia, hesitancy, discharge, hematuria or flank pain. Musculoskeletal: Denies arthralgias, myalgias, stiffness, jt. swelling, pain, limping or strain/sprain.  Skin: Denies pruritus, rash, hives, warts, acne, eczema or change in skin lesion(s). Neuro: No weakness, tremor, incoordination, spasms, paresthesia or pain. Psychiatric: Denies confusion, memory loss or sensory loss. Endo: Denies change in weight, skin or hair change.  Heme/Lymph: No excessive bleeding, bruising or enlarged lymph nodes.  Physical Exam  BP 122/84   Pulse 92   Temp 97.3 F (36.3 C)   Resp 16   Ht 5\' 5"  (1.651 m)   Wt 193 lb 12.8 oz (87.9 kg)   BMI 32.25 kg/m   Appears povernourished, well groomed  and in no distress.  Eyes: PERRLA, EOMs, conjunctiva no swelling or erythema. Sinuses: No frontal/maxillary tenderness ENT/Mouth: EAC's clear, TM's nl w/o erythema, bulging. Nares clear w/o erythema, swelling, exudates. Oropharynx clear without erythema or exudates. Oral hygiene is good. Tongue normal, non  obstructing. Hearing intact.  Neck: Supple. Thyroid nl. Car 2+/2+ without bruits, nodes or JVD. Chest: Respirations nl with BS clear & equal w/o rales, rhonchi, wheezing or stridor.  Cor: Heart sounds normal w/ regular rate and rhythm without sig. murmurs, gallops, clicks or rubs. Peripheral pulses normal and equal  without edema.  Abdomen: Soft & bowel sounds normal. Non-tender w/o guarding, rebound, hernias, masses or organomegaly.  Lymphatics: Unremarkable.  Musculoskeletal: Full ROM all peripheral extremities, joint stability, 5/5 strength and normal gait.  Skin: Warm, dry without exposed rashes, lesions or ecchymosis apparent.  Neuro: Cranial nerves intact, reflexes equal bilaterally. Sensory-motor testing grossly intact. Tendon reflexes grossly intact.  Pysch: Alert & oriented x 3.  Insight and judgement nl & appropriate. No ideations.  Assessment and Plan:  - Continue medication, monitor blood pressure at home.  - Continue DASH diet. Reminder to  go to the ER if any CP,  SOB, nausea, dizziness, severe HA, changes vision/speech,  left arm numbness and tingling and jaw pain.  - Continue diet/meds, exercise,& lifestyle modifications.  - Continue monitor periodic cholesterol/liver & renal functions   1. Essential hypertension  - CBC with Differential/Platelet - BASIC METABOLIC PANEL WITH GFR -                            5. OSA on CPAP   6. Fibromyalgia   7. Medication management  - CBC with Differential/Platelet - BASIC METABOLIC PANEL WITH GFR - Hepatic function panel - Magnesium - Lipid panel - TSH - Hemoglobin A1c - Insulin, random - VITAMIN D 25 Hydroxy   - Continue diet, exercise, lifestyle modifications.  - Monitor appropriate labs. - Continue supplementation.      Discussed  regular exercise, BP monitoring, weight control to achieve/maintain BMI less than 25 and discussed med and SE's. Recommended labs to assess and monitor clinical status with further  disposition pending results of labs. Over 30 minutes of exam, counseling, chart review was performed.

## 2016-11-11 NOTE — Patient Instructions (Signed)

## 2016-11-12 LAB — VITAMIN D 25 HYDROXY (VIT D DEFICIENCY, FRACTURES): VIT D 25 HYDROXY: 75 ng/mL (ref 30–100)

## 2016-11-12 LAB — INSULIN, RANDOM: Insulin: 31.5 u[IU]/mL — ABNORMAL HIGH (ref 2.0–19.6)

## 2016-11-12 LAB — TSH: TSH: 1.45 mIU/L

## 2016-11-12 LAB — MAGNESIUM: MAGNESIUM: 1.9 mg/dL (ref 1.5–2.5)

## 2016-11-12 LAB — HEMOGLOBIN A1C
Hgb A1c MFr Bld: 5.7 % — ABNORMAL HIGH (ref <5.7)
MEAN PLASMA GLUCOSE: 117 mg/dL

## 2016-11-19 ENCOUNTER — Other Ambulatory Visit (HOSPITAL_COMMUNITY): Payer: Self-pay | Admitting: Diagnostic Radiology

## 2016-11-19 ENCOUNTER — Ambulatory Visit
Admission: RE | Admit: 2016-11-19 | Discharge: 2016-11-19 | Disposition: A | Discharge status: Home / Self Care | Payer: Medicare Other | Source: Ambulatory Visit | Attending: Internal Medicine | Admitting: Internal Medicine

## 2016-11-19 DIAGNOSIS — Z1231 Encounter for screening mammogram for malignant neoplasm of breast: Secondary | ICD-10-CM

## 2016-12-15 ENCOUNTER — Other Ambulatory Visit: Payer: Self-pay | Admitting: Internal Medicine

## 2016-12-16 ENCOUNTER — Encounter: Payer: Self-pay | Admitting: Physician Assistant

## 2016-12-16 ENCOUNTER — Ambulatory Visit (INDEPENDENT_AMBULATORY_CARE_PROVIDER_SITE_OTHER): Payer: Medicare Other | Admitting: Physician Assistant

## 2016-12-16 VITALS — BP 118/74 | HR 101 | Temp 97.3°F | Resp 18 | Ht 65.0 in | Wt 192.0 lb

## 2016-12-16 DIAGNOSIS — R222 Localized swelling, mass and lump, trunk: Secondary | ICD-10-CM | POA: Diagnosis not present

## 2016-12-16 NOTE — Progress Notes (Signed)
Subjective:    Patient ID: [name], female    DOB: 1955/06/28, 61 y.o.   MRN: [id number]  HPI 61 y.o. WF presents for knot on right chest. She was taking her granddaughter from her husband and felt pain at right chest on the 18th, felt painful knot. No redness, no warmth.  Last MGM was 11/19/2016 and normal.   Blood pressure 118/74, pulse (!) 101, temperature 97.3 F (36.3 C), resp. rate 18, height 5\' 5"  (1.651 m), weight 192 lb (87.1 kg), SpO2 98 %.  Medications Current Outpatient Prescriptions on File Prior to Visit  Medication Sig  . acetaminophen (TYLENOL) 500 MG tablet Take 500-1,000 mg by mouth every 6 (six) hours as needed for pain.  Marland Kitchen acyclovir (ZOVIRAX) 400 MG tablet Take 1 tablet (400 mg total) by mouth 3 (three) times daily.  Marland Kitchen aspirin 81 MG tablet Take 81 mg by mouth daily.  . Cetirizine HCl 10 MG CAPS Take 1 capsule by mouth daily.  . Cholecalciferol (VITAMIN D PO) Take 8,000 Int'l Units by mouth daily.   . clonazePAM (KLONOPIN) 1 MG tablet Take 1 mg by mouth at bedtime.  . fluticasone (FLONASE) 50 MCG/ACT nasal spray Place 2 sprays into both nostrils daily.  Marland Kitchen glucose blood (ACCU-CHEK AVIVA PLUS) test strip USE AS DIRECTED DAILY  . hydrochlorothiazide (HYDRODIURIL) 25 MG tablet TAKE 1 TABLET BY MOUTH EVERY MORNING FOR BLOOD PRESSURE AND FLUID RETENTION  . Ibuprofen 200 MG CAPS Take 1 capsule by mouth. PRN  . lamoTRIgine (LAMICTAL) 150 MG tablet Take 150 mg by mouth at bedtime.  . Lancets (ACCU-CHEK MULTICLIX) lancets Use to check blood glucose daily  . Lysine 500 MG TABS Take by mouth daily. Currently taking 3 pill QD  . Magnesium 250 MG TABS Take 250 mg by mouth. Takes 5 pills per day  . meloxicam (MOBIC) 15 MG tablet Take 1 tablet (15 mg total) by mouth daily.  . Multiple Vitamin (MULTIVITAMIN WITH MINERALS) TABS tablet Take 0.5 tablets by mouth 2 (two) times daily.  . naproxen sodium (ANAPROX) 220 MG tablet Take 220 mg by mouth 2 (two) times daily with a meal.  PRN  . OVER THE COUNTER MEDICATION 2 tablets daily. OsteoBioFlex  . QUEtiapine (SEROQUEL) 100 MG tablet Take 100 mg by mouth 3 (three) times daily.  . rosuvastatin (CRESTOR) 40 MG tablet Take 40 mg by mouth daily.  . traMADol (ULTRAM) 50 MG tablet Take 1 tablet (50 mg total) by mouth every 6 (six) hours as needed.  Marland Kitchen ZOSTAVAX 45809 UNT/0.65ML injection ADM 0.65ML Painter UTD   No current facility-administered medications on file prior to visit.     Problem list She has Essential hypertension; Mixed hyperlipidemia; Prediabetes; Vitamin D deficiency; Medication management; Fibromyalgia; Depression, controlled; Obesity (BMI 30.5); OSA on CPAP; At low risk for fall; Osteoarthritis of both knees; BMI 30.0-30.9,adult; and Medicare annual wellness visit, subsequent on her problem list.   Review of Systems  Constitutional: Negative.   HENT: Negative.   Respiratory: Negative.   Cardiovascular: Negative.   Gastrointestinal: Negative.   Genitourinary: Negative.   Musculoskeletal: Negative.   Skin: Negative.   Neurological: Negative.   Hematological: Negative.   Psychiatric/Behavioral: Negative.        Objective:   Physical Exam  Constitutional: She is oriented to person, place, and time. She appears well-developed and well-nourished.  HENT:  Head: Normocephalic and atraumatic.  Right Ear: External ear normal.  Left Ear: External ear normal.  Mouth/Throat: Oropharynx is clear and  moist.  Eyes: Conjunctivae and EOM are normal. Pupils are equal, round, and reactive to light.  Neck: Normal range of motion. Neck supple. No thyromegaly present.  Cardiovascular: Normal rate, regular rhythm and normal heart sounds.  Exam reveals no gallop and no friction rub.   No murmur heard. Pulmonary/Chest: Effort normal and breath sounds normal. No respiratory distress. She has no wheezes.    Abdominal: Soft. Bowel sounds are normal. She exhibits no distension and no mass. There is no tenderness. There is no  rebound and no guarding.  Musculoskeletal: Normal range of motion.  Lymphadenopathy:    She has no cervical adenopathy.  Neurological: She is alert and oriented to person, place, and time. She displays normal reflexes. No cranial nerve deficit. Coordination normal.  Skin: Skin is warm and dry.  Psychiatric: She has a normal mood and affect.       Assessment & Plan:  Mobile mass on chest Likely cyst/lipoma, recent normal MGM Will try to get just [place] since patient has anxiety

## 2016-12-16 NOTE — Patient Instructions (Signed)
Epidermal Cyst An epidermal cyst is sometimes called an epidermal inclusion cyst or an infundibular cyst. It is a sac made of skin tissue. The sac contains a substance called keratin. Keratin is a protein that is normally secreted through the hair follicles. When keratin becomes trapped in the top layer of skin (epidermis), it can form an epidermal cyst. Epidermal cysts are usually found on the face, neck, trunk, and genitals. These cysts are usually harmless (benign), and they may not cause symptoms unless they become infected. It is important not to pop epidermal cysts yourself. What are the causes? This condition may be caused by:  A blocked hair follicle.  A hair that curls and re-enters the skin instead of growing straight out of the skin (ingrown hair).  A blocked pore.  Irritated skin.  An injury to the skin.  Certain conditions that are passed along from parent to child (inherited).  Human papillomavirus (HPV).  What increases the risk? The following factors may make you more likely to develop an epidermal cyst:  Having acne.  Being overweight.  Wearing tight clothing.  What are the signs or symptoms? The only symptom of this condition may be a small, painless lump underneath the skin. When an epidermal cyst becomes infected, symptoms may include:  Redness.  Inflammation.  Tenderness.  Warmth.  Fever.  Keratin draining from the cyst. Keratin may look like a grayish-white, bad-smelling substance.  Pus draining from the cyst.  How is this diagnosed? This condition is diagnosed with a physical exam. In some cases, you may have a sample of tissue (biopsy) taken from your cyst to be examined under a microscope or tested for bacteria. You may be referred to a health care provider who specializes in skin care (dermatologist). How is this treated? In many cases, epidermal cysts go away on their own without treatment. If a cyst becomes infected, treatment may  include:  Opening and draining the cyst. After draining, minor surgery to remove the rest of the cyst may be done.  Antibiotic medicine to help prevent infection.  Injections of medicines (steroids) that help to reduce inflammation.  Surgery to remove the cyst. Surgery may be done if: ? The cyst becomes large. ? The cyst bothers you. ? There is a chance that the cyst could turn into cancer.  Follow these instructions at home:  Take over-the-counter and prescription medicines only as told by your health care provider.  If you were prescribed an antibiotic, use it as told by your health care provider. Do not stop using the antibiotic even if you start to feel better.  Keep the area around your cyst clean and dry.  Wear loose, dry clothing.  Do not try to pop your cyst.  Avoid touching your cyst.  Check your cyst every day for signs of infection.  Keep all follow-up visits as told by your health care provider. This is important. How is this prevented?  Wear clean, dry, clothing.  Avoid wearing tight clothing.  Keep your skin clean and dry. Shower or take baths every day.  Wash your body with a benzoyl peroxide wash when you shower or bathe. Contact a health care provider if:  Your cyst develops symptoms of infection.  Your condition is not improving or is getting worse.  You develop a cyst that looks different from other cysts you have had.  You have a fever. Get help right away if:  Redness spreads from the cyst into the surrounding area. This information is   not intended to replace advice given to you by your health care provider. Make sure you discuss any questions you have with your health care provider. Document Released: 05/09/2004 Document Revised: 02/05/2016 Document Reviewed: 04/10/2015 Elsevier Interactive Patient Education  2018 [employer].  Lipoma A lipoma is a noncancerous (benign) tumor that is made up of fat cells. This is a very common type of  soft-tissue growth. Lipomas are usually found under the skin (subcutaneous). They may occur in any tissue of the body that contains fat. Common areas for lipomas to appear include the back, shoulders, buttocks, and thighs. Lipomas grow slowly, and they are usually painless. Most lipomas do not cause problems and do not require treatment. What are the causes? The cause of this condition is not known. What increases the risk? This condition is more likely to develop in:  People who are 58-71 years old.  People who have a family history of lipomas.  What are the signs or symptoms? A lipoma usually appears as a small, round bump under the skin. It may feel soft or rubbery, but the firmness can vary. Most lipomas are not painful. However, a lipoma may become painful if it is located in an area where it pushes on nerves. How is this diagnosed? A lipoma can usually be diagnosed with a physical exam. You may also have tests to confirm the diagnosis and to rule out other conditions. Tests may include:  Imaging tests, such as a CT scan or MRI.  Removal of a tissue sample to be looked at under a microscope (biopsy).  How is this treated? Treatment is not needed for small lipomas that are not causing problems. If a lipoma continues to get bigger or it causes problems, removal is often the best option. Lipomas can also be removed to improve appearance. Removal of a lipoma is usually done with a surgery in which the fatty cells and the surrounding capsule are removed. Most often, a medicine that numbs the area (local anesthetic) is used for this procedure. Follow these instructions at home:  Keep all follow-up visits as directed by your health care provider. This is important. Contact a health care provider if:  Your lipoma becomes larger or hard.  Your lipoma becomes painful, red, or increasingly swollen. These could be signs of infection or a more serious condition. This information is not intended  to replace advice given to you by your health care provider. Make sure you discuss any questions you have with your health care provider. Document Released: 05/29/2002 Document Revised: 11/14/2015 Document Reviewed: 06/04/2014 Elsevier Interactive Patient Education  [employer].

## 2016-12-18 ENCOUNTER — Ambulatory Visit
Admission: RE | Admit: 2016-12-18 | Discharge: 2016-12-18 | Disposition: A | Discharge status: Home / Self Care | Payer: Medicare Other | Source: Ambulatory Visit | Attending: Physician Assistant | Admitting: Physician Assistant

## 2016-12-18 DIAGNOSIS — R222 Localized swelling, mass and lump, trunk: Secondary | ICD-10-CM

## 2017-02-16 NOTE — Progress Notes (Signed)
Assessment and Plan:   Hypertension -Continue medication, monitor blood pressure at home. Continue DASH diet.  Reminder to go to the ER if any CP, SOB, nausea, dizziness, severe HA, changes vision/speech, left arm numbness and tingling and jaw pain.  Cholesterol -Continue diet and exercise. Check cholesterol.    Prediabetes  -Continue diet and exercise. Check A1C   Vitamin D Def - check level and continue medications.   Obesity with co morbidities - long discussion about weight loss, diet, and exercise   Continue diet and meds as discussed. Further disposition pending results of labs. Over 30 minutes of exam, counseling, chart review, and critical decision making was performed  Future Appointments Date Time Provider [place]  05/28/2017 10:00 AM Unk Pinto, MD [facility] None     HPI 61 y.o. female  presents for 3 month follow up on hypertension, cholesterol, prediabetes, and vitamin D deficiency.   Her blood pressure has been controlled at home, today their BP is BP: 122/78  She does not workout but states she does not sit down. She denies chest pain, shortness of breath, dizziness. Has lipoma on chest and feels may have one on left shoulder.   She is on cholesterol medication, crestor 40mg  1/2 tablet Monday, Wednesday and Friday and denies myalgias. Her cholesterol is at goal. The cholesterol last visit was:   Lab Results  Component Value Date   CHOL 159 11/11/2016   HDL 47 (L) 11/11/2016   LDLCALC 84 11/11/2016   TRIG 138 11/11/2016   CHOLHDL 3.4 11/11/2016    She has been working on diet and exercise for prediabetes, and denies paresthesia of the feet, polydipsia, polyuria and visual disturbances. Last A1C in the office was:  Lab Results  Component Value Date   HGBA1C 5.7 (H) 11/11/2016   Patient is on Vitamin D supplement.   Lab Results  Component Value Date   VD25OH 75 11/11/2016     BMI is Body mass index is 32.35 kg/m., she is working on diet  and exercise. She is on CPAP for OSA.  Wt Readings from Last 3 Encounters:  02/17/17 194 lb 6.4 oz (88.2 kg)  12/16/16 192 lb (87.1 kg)  11/11/16 193 lb 12.8 oz (87.9 kg)    Current Medications:  Current Outpatient Prescriptions on File Prior to Visit  Medication Sig Dispense Refill  . acetaminophen (TYLENOL) 500 MG tablet Take 500-1,000 mg by mouth every 6 (six) hours as needed for pain.    Marland Kitchen acyclovir (ZOVIRAX) 400 MG tablet Take 1 tablet (400 mg total) by mouth 3 (three) times daily. 21 tablet 2  . aspirin 81 MG tablet Take 81 mg by mouth daily.    . Cetirizine HCl 10 MG CAPS Take 1 capsule by mouth daily.    . Cholecalciferol (VITAMIN D PO) Take 8,000 Int'l Units by mouth daily.     . clonazePAM (KLONOPIN) 1 MG tablet Take 1 mg by mouth at bedtime.    . fluticasone (FLONASE) 50 MCG/ACT nasal spray Place 2 sprays into both nostrils daily. 16 g 2  . glucose blood (ACCU-CHEK AVIVA PLUS) test strip USE AS DIRECTED DAILY 100 each 1  . hydrochlorothiazide (HYDRODIURIL) 25 MG tablet TAKE 1 TABLET BY MOUTH EVERY MORNING FOR BLOOD PRESSURE AND FLUID RETENTION 90 tablet 1  . Ibuprofen 200 MG CAPS Take 1 capsule by mouth. PRN    . lamoTRIgine (LAMICTAL) 150 MG tablet Take 150 mg by mouth at bedtime.    . Lancets (ACCU-CHEK MULTICLIX) lancets  Use to check blood glucose daily 100 each PRN  . Lysine 500 MG TABS Take by mouth daily. Currently taking 3 pill QD    . Magnesium 250 MG TABS Take 250 mg by mouth. Takes 5 pills per day    . meloxicam (MOBIC) 15 MG tablet Take 1 tablet (15 mg total) by mouth daily. 90 tablet 99  . Multiple Vitamin (MULTIVITAMIN WITH MINERALS) TABS tablet Take 0.5 tablets by mouth 2 (two) times daily.    . naproxen sodium (ANAPROX) 220 MG tablet Take 220 mg by mouth 2 (two) times daily with a meal. PRN    . OVER THE COUNTER MEDICATION 2 tablets daily. OsteoBioFlex    . QUEtiapine (SEROQUEL) 100 MG tablet Take 100 mg by mouth 3 (three) times daily.    . rosuvastatin  (CRESTOR) 40 MG tablet Take 40 mg by mouth daily.    . traMADol (ULTRAM) 50 MG tablet Take 1 tablet (50 mg total) by mouth every 6 (six) hours as needed. 30 tablet 0  . ZOSTAVAX 01601 UNT/0.65ML injection ADM 0.65ML [place] UTD  0   No current facility-administered medications on file prior to visit.    Medical History:  Past Medical History:  Diagnosis Date  . Arthritis   . Depression   . Elevated hemoglobin A1c   . Fibromyalgia   . Hypertension   . Other abnormal glucose 08/09/2013  . Sleep apnea    cpap   > 4 yrs  last sleep study  . Unspecified essential hypertension 08/09/2013  . Unspecified vitamin D deficiency 08/09/2013  . Vitamin D deficiency    Allergies:  Allergies  Allergen Reactions  . Latex     Band aids= if left on for extended period of time  . Celecoxib     REACTION: Rash (celebrex)  . Cephalexin     REACTION: Rash (keflex)  . Codeine     REACTION: Rash  . Erythromycin     REACTION: Rash (emycin)  . Sulfonamide Derivatives     REACTION: Rash  . Trazodone And Nefazodone     insomnia  . 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   HENT: Negative.   Eyes: Negative.   Respiratory: Negative.   Cardiovascular: Negative.   Gastrointestinal: Negative.   Genitourinary: Negative.   Musculoskeletal: Negative.   Skin: Negative.   Neurological: Negative.   Endo/Heme/Allergies: Negative.   Psychiatric/Behavioral: Negative.     Family history- Review and unchanged Social history- Review and unchanged Physical Exam: BP 122/78   Pulse 93   Temp (!) 97.5 F (36.4 C)   Resp 16   Ht 5\' 5"  (1.651 m)   Wt 194 lb 6.4 oz (88.2 kg)   SpO2 97%   BMI 32.35 kg/m  Wt Readings from Last 3 Encounters:  02/17/17 194 lb 6.4 oz (88.2 kg)  12/16/16 192 lb (87.1 kg)  11/11/16 193 lb 12.8 oz (87.9 kg)   General Appearance: Well nourished, in no apparent distress. Eyes: PERRLA, EOMs,  conjunctiva no swelling or erythema Sinuses: No Frontal/maxillary tenderness ENT/Mouth: Ext aud canals clear, TMs without erythema, bulging. No erythema, swelling, or exudate on post pharynx.  Tonsils not swollen or erythematous. Hearing normal.  Neck: Supple, thyroid normal.  Respiratory: Respiratory effort normal, BS equal bilaterally without rales, rhonchi, wheezing or stridor.  Cardio: RRR with no MRGs. Brisk peripheral pulses without edema.  Abdomen: Soft, + BS,  Non tender, no guarding, rebound, hernias, masses. Lymphatics: Non tender without lymphadenopathy.  Musculoskeletal: Full ROM, 5/5 strength, Normal gait Skin: Warm, dry without rashes, lesions, ecchymosis.  Neuro: Cranial nerves intact. Normal muscle tone, no cerebellar symptoms. Psych: Awake and oriented X 3, normal affect, Insight and Judgment appropriate.    Vicie Mutters, PA-C 11:02 AM [facility]

## 2017-02-17 ENCOUNTER — Ambulatory Visit (INDEPENDENT_AMBULATORY_CARE_PROVIDER_SITE_OTHER): Payer: Medicare Other | Admitting: Physician Assistant

## 2017-02-17 ENCOUNTER — Encounter: Payer: Self-pay | Admitting: Physician Assistant

## 2017-02-17 VITALS — BP 122/78 | HR 93 | Temp 97.5°F | Resp 16 | Ht 65.0 in | Wt 194.4 lb

## 2017-02-17 DIAGNOSIS — Z9989 Dependence on other enabling machines and devices: Secondary | ICD-10-CM

## 2017-02-17 DIAGNOSIS — Z79899 Other long term (current) drug therapy: Secondary | ICD-10-CM

## 2017-02-17 DIAGNOSIS — G4733 Obstructive sleep apnea (adult) (pediatric): Secondary | ICD-10-CM

## 2017-02-17 DIAGNOSIS — E669 Obesity, unspecified: Secondary | ICD-10-CM

## 2017-02-17 DIAGNOSIS — I10 Essential (primary) hypertension: Secondary | ICD-10-CM

## 2017-02-17 DIAGNOSIS — E782 Mixed hyperlipidemia: Secondary | ICD-10-CM

## 2017-02-17 DIAGNOSIS — F3341 Major depressive disorder, recurrent, in partial remission: Secondary | ICD-10-CM

## 2017-02-17 DIAGNOSIS — R7303 Prediabetes: Secondary | ICD-10-CM | POA: Diagnosis not present

## 2017-02-17 NOTE — Patient Instructions (Signed)
Your ears and sinuses are connected by the eustachian tube. When your sinuses are inflamed, this can close off the tube and cause fluid to collect in your middle ear. This can then cause dizziness, popping, clicking, ringing, and echoing in your ears. This is often NOT an infection and does NOT require antibiotics, it is caused by inflammation so the treatments help the inflammation. This can take a long time to get better so please be patient.  Here are things you can do to help with this: - Try the Flonase or Nasonex. Remember to spray each nostril twice towards the outer part of your eye.  Do not sniff but instead pinch your nose and tilt your head back to help the medicine get into your sinuses.  The best time to do this is at bedtime.Stop if you get blurred vision or nose bleeds.  -While drinking fluids, pinch and hold nose close and swallow, to help open eustachian tubes to drain fluid behind ear drums. -Please pick one of the over the counter allergy medications below and take it once daily for allergies.  It will also help with fluid behind ear drums. Claritin or loratadine cheapest but likely the weakest  Zyrtec or certizine at night because it can make you sleepy The strongest is allegra or fexafinadine  Cheapest at [employer], [employer], [employer] -can use decongestant over the counter, please do not use if you have high blood pressure or certain heart conditions.   if worsening HA, changes vision/speech, imbalance, weakness go to the ER   

## 2017-02-18 LAB — MAGNESIUM: MAGNESIUM: 1.9 mg/dL (ref 1.5–2.5)

## 2017-02-18 LAB — HEMOGLOBIN A1C
Hgb A1c MFr Bld: 5.6 % of total Hgb (ref <5.7)
Mean Plasma Glucose: 114 (calc)
eAG (mmol/L): 6.3 (calc)

## 2017-02-18 LAB — CBC WITH DIFFERENTIAL/PLATELET
BASOS ABS: 17 {cells}/uL (ref 0–200)
Basophils Relative: 0.3 %
EOS PCT: 3.3 %
Eosinophils Absolute: 191 cells/uL (ref 15–500)
HEMATOCRIT: 41.2 % (ref 35.0–45.0)
Hemoglobin: 13.9 g/dL (ref 11.7–15.5)
LYMPHS ABS: 2158 {cells}/uL (ref 850–3900)
MCH: 30.3 pg (ref 27.0–33.0)
MCHC: 33.7 g/dL (ref 32.0–36.0)
MCV: 89.8 fL (ref 80.0–100.0)
MONOS PCT: 8.9 %
MPV: 11 fL (ref 7.5–12.5)
NEUTROS PCT: 50.3 %
Neutro Abs: 2917 cells/uL (ref 1500–7800)
PLATELETS: 318 10*3/uL (ref 140–400)
RBC: 4.59 10*6/uL (ref 3.80–5.10)
RDW: 13 % (ref 11.0–15.0)
Total Lymphocyte: 37.2 %
WBC mixed population: 516 cells/uL (ref 200–950)
WBC: 5.8 10*3/uL (ref 3.8–10.8)

## 2017-02-18 LAB — BASIC METABOLIC PANEL WITH GFR
BUN: 15 mg/dL (ref 7–25)
CHLORIDE: 104 mmol/L (ref 98–110)
CO2: 24 mmol/L (ref 20–32)
CREATININE: 0.71 mg/dL (ref 0.50–0.99)
Calcium: 9.4 mg/dL (ref 8.6–10.4)
GFR, Est African American: 107 mL/min/{1.73_m2} (ref >=60)
GFR, Est Non African American: 93 mL/min/{1.73_m2} (ref >=60)
GLUCOSE: 90 mg/dL (ref 65–99)
POTASSIUM: 4.1 mmol/L (ref 3.5–5.3)
SODIUM: 140 mmol/L (ref 135–146)

## 2017-02-18 LAB — HEPATIC FUNCTION PANEL
AG RATIO: 1.6 (calc) (ref 1.0–2.5)
ALKALINE PHOSPHATASE (APISO): 94 U/L (ref 33–130)
ALT: 36 U/L — AB (ref 6–29)
AST: 26 U/L (ref 10–35)
Albumin: 4.2 g/dL (ref 3.6–5.1)
BILIRUBIN INDIRECT: 0.4 mg/dL (ref 0.2–1.2)
Bilirubin, Direct: 0.1 mg/dL (ref 0.0–0.2)
Globulin: 2.7 g/dL (calc) (ref 1.9–3.7)
TOTAL PROTEIN: 6.9 g/dL (ref 6.1–8.1)
Total Bilirubin: 0.5 mg/dL (ref 0.2–1.2)

## 2017-02-18 LAB — LIPID PANEL
CHOLESTEROL: 155 mg/dL (ref <200)
HDL: 53 mg/dL (ref >50)
LDL CHOLESTEROL (CALC): 76 mg/dL
Non-HDL Cholesterol (Calc): 102 mg/dL (calc) (ref <130)
TRIGLYCERIDES: 157 mg/dL — AB (ref <150)
Total CHOL/HDL Ratio: 2.9 (calc) (ref <5.0)

## 2017-02-18 LAB — TSH: TSH: 1.3 m[IU]/L (ref 0.40–4.50)

## 2017-04-01 ENCOUNTER — Other Ambulatory Visit: Payer: Self-pay | Admitting: Internal Medicine

## 2017-04-15 ENCOUNTER — Encounter: Payer: Self-pay | Admitting: *Deleted

## 2017-05-06 ENCOUNTER — Encounter: Payer: Self-pay | Admitting: Internal Medicine

## 2017-05-12 ENCOUNTER — Encounter: Payer: Self-pay | Admitting: Internal Medicine

## 2017-05-28 ENCOUNTER — Encounter: Payer: Self-pay | Admitting: Internal Medicine

## 2017-05-28 ENCOUNTER — Ambulatory Visit: Payer: Medicare Other | Admitting: Internal Medicine

## 2017-05-28 VITALS — BP 126/80 | HR 103 | Temp 97.7°F | Resp 16 | Ht 64.5 in | Wt 191.4 lb

## 2017-05-28 DIAGNOSIS — Z0000 Encounter for general adult medical examination without abnormal findings: Secondary | ICD-10-CM | POA: Diagnosis not present

## 2017-05-28 DIAGNOSIS — Z1212 Encounter for screening for malignant neoplasm of rectum: Secondary | ICD-10-CM

## 2017-05-28 DIAGNOSIS — Z79899 Other long term (current) drug therapy: Secondary | ICD-10-CM

## 2017-05-28 DIAGNOSIS — Z23 Encounter for immunization: Secondary | ICD-10-CM | POA: Diagnosis not present

## 2017-05-28 DIAGNOSIS — R7303 Prediabetes: Secondary | ICD-10-CM

## 2017-05-28 DIAGNOSIS — G4733 Obstructive sleep apnea (adult) (pediatric): Secondary | ICD-10-CM

## 2017-05-28 DIAGNOSIS — Z0001 Encounter for general adult medical examination with abnormal findings: Secondary | ICD-10-CM

## 2017-05-28 DIAGNOSIS — Z9989 Dependence on other enabling machines and devices: Secondary | ICD-10-CM

## 2017-05-28 DIAGNOSIS — Z1211 Encounter for screening for malignant neoplasm of colon: Secondary | ICD-10-CM

## 2017-05-28 DIAGNOSIS — R7309 Other abnormal glucose: Secondary | ICD-10-CM

## 2017-05-28 DIAGNOSIS — E559 Vitamin D deficiency, unspecified: Secondary | ICD-10-CM

## 2017-05-28 DIAGNOSIS — E782 Mixed hyperlipidemia: Secondary | ICD-10-CM

## 2017-05-28 DIAGNOSIS — Z136 Encounter for screening for cardiovascular disorders: Secondary | ICD-10-CM

## 2017-05-28 DIAGNOSIS — I10 Essential (primary) hypertension: Secondary | ICD-10-CM

## 2017-05-28 DIAGNOSIS — F3341 Major depressive disorder, recurrent, in partial remission: Secondary | ICD-10-CM

## 2017-05-28 NOTE — Progress Notes (Signed)
[facility] Unk Pinto, M.D.     Uvaldo Bristle. Silverio Lay, P.A.-C Liane Comber, [place] [address] [place], [place] [address] Telephone [phone] Telefax [phone] Annual Screening/Preventative Visit & Comprehensive Evaluation &  Examination     This very nice 61 y.o. MWF presents for a Screening/Preventative Visit & comprehensive evaluation and management of multiple medical co-morbidities.  Patient has been followed for HTN, Prediabetes, Hyperlipidemia and Vitamin D Deficiency. Also patient has been on CPAP for OSA since 2004 reporting 100% compliance with improved restorative sleep and no excessive daytime sleepiness.      Patient has been on SS Disability since age 52 (2007) for Panic, Anxiety & Depression.       HTN predates since 2004. Patient's BP has been controlled at home and patient denies any cardiac symptoms as chest pain, palpitations, shortness of breath, dizziness or ankle swelling. Today's BP is at goal -  126/80.      Patient's hyperlipidemia is controlled with diet and medications. Patient denies myalgias or other medication SE's. Last lipids were at goal: Lab Results  Component Value Date   CHOL 155 02/17/2017   HDL 53 02/17/2017   LDLCALC 84 11/11/2016   TRIG 157 (H) 02/17/2017   CHOLHDL 2.9 02/17/2017      Patient has Morbid Obesity 32+ and prediabetes (A1c 5.7% / 2010)   and patient denies reactive hypoglycemic symptoms, visual blurring, diabetic polys, or paresthesias. She reports FBG's <120 mg%. Last A1c was improved and at at goal: Lab Results  Component Value Date   HGBA1C 5.6 02/17/2017      Finally, patient has history of Vitamin D Deficiency ("23" / 2008) and last Vitamin D was at goal: Lab Results  Component Value Date   VD25OH 27 11/11/2016   Current Outpatient Medications on File Prior to Visit  Medication Sig  . acetaminophen (TYLENOL) 500 MG tablet Take  500-1,000 mg by mouth every 6 (six) hours as needed for pain.  Marland Kitchen acyclovir (ZOVIRAX) 400 MG tablet Take 1 tablet (400 mg total) by mouth 3 (three) times daily.  Marland Kitchen aspirin 81 MG tablet Take 81 mg by mouth daily.  . Cetirizine HCl 10 MG CAPS Take 1 capsule by mouth daily.  . Cholecalciferol (VITAMIN D PO) Take 8,000 Int'l Units by mouth daily.   . clonazePAM (KLONOPIN) 1 MG tablet Take 1 mg by mouth at bedtime.  . fluticasone (FLONASE) 50 MCG/ACT nasal spray Place 2 sprays into both nostrils daily.  Marland Kitchen glucose blood (ACCU-CHEK AVIVA PLUS) test strip USE AS DIRECTED DAILY  . hydrochlorothiazide (HYDRODIURIL) 25 MG tablet TAKE 1 TABLET BY MOUTH EVERY MORNING FOR BLOOD PRESSURE AND FLUID RETENTION  . Ibuprofen 200 MG CAPS Take 1 capsule by mouth. PRN  . lamoTRIgine (LAMICTAL) 150 MG tablet Take 150 mg by mouth at bedtime.  . Lancets (ACCU-CHEK MULTICLIX) lancets Use to check blood glucose daily  . Lysine 500 MG TABS Take by mouth daily. Currently taking 3 pill QD  . Magnesium 250 MG TABS Take 250 mg by mouth. Takes 5 pills per day  . meloxicam (MOBIC) 15 MG tablet Take 1 tablet (15 mg total) by mouth daily.  . Multiple Vitamin (MULTIVITAMIN WITH MINERALS) TABS tablet Take 0.5 tablets by mouth 2 (two) times daily.  . naproxen sodium (ANAPROX) 220 MG tablet Take 220 mg by mouth 2 (two) times daily with a meal. PRN  . OVER THE COUNTER MEDICATION 2 tablets daily. OsteoBioFlex  .  QUEtiapine (SEROQUEL) 100 MG tablet Take 100 mg by mouth 3 (three) times daily.  . rosuvastatin (CRESTOR) 40 MG tablet TAKE 1 TABLET BY MOUTH DAILY  . traMADol (ULTRAM) 50 MG tablet Take 1 tablet (50 mg total) by mouth every 6 (six) hours as needed.  Marland Kitchen ZOSTAVAX 56213 UNT/0.65ML injection ADM 0.65ML [place] UTD   No current facility-administered medications on file prior to visit.    Allergies  Allergen Reactions  . Latex     Band aids= if left on for extended period of time  . Celecoxib     REACTION: Rash (celebrex)  .  Cephalexin     REACTION: Rash (keflex)  . Codeine     REACTION: Rash  . Erythromycin     REACTION: Rash (emycin)  . Sulfonamide Derivatives     REACTION: Rash  . Trazodone And Nefazodone     insomnia  . 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 Arthritis   . Depression   . Elevated hemoglobin A1c   . Fibromyalgia   . Hypertension   . Other abnormal glucose 08/09/2013  . Sleep apnea    cpap   > 4 yrs  last sleep study  . Unspecified essential hypertension 08/09/2013  . Unspecified vitamin D deficiency 08/09/2013  . Vitamin D deficiency    Health Maintenance  Topic Date Due  . COLONOSCOPY  12/11/2017  . MAMMOGRAM  11/20/2018  . TETANUS/TDAP  07/04/2025  . INFLUENZA VACCINE  Completed  . Hepatitis C Screening  Completed  . HIV Screening  Completed  . PAP SMEAR  Discontinued   Immunization History  Administered Date(s) Administered  . DT 07/05/2015  . Influenza Inj Mdck Quad With Preservative 05/28/2017  . Influenza Split 04/06/2012, 04/05/2013, 05/16/2014, 03/21/2015  . Influenza,inj,quad, With Preservative 04/29/2016  . Pneumococcal Conjugate-13 05/16/2014  . Pneumococcal-Unspecified 06/22/1998  . Td 06/22/2004  . Zoster 10/21/2015   Past Surgical History:  Procedure Laterality Date  . ABDOMINAL HYSTERECTOMY    . JOINT REPLACEMENT     arthroscopy  . TOTAL KNEE ARTHROPLASTY Right 04/14/2013   Procedure: RIGHT TOTAL KNEE ARTHROPLASTY AND LEFT KNEE INJECTION ;  Surgeon: Alta Corning, MD;  Location: [facility];  Service: Orthopedics;  Laterality: Right;   Family History  Problem Relation Age of Onset  . Hypertension Mother   . Hypertension Father   . COPD Father   . Cancer Father        thyroid  . CVA Maternal Grandmother    Social History   Tobacco Use  . Smoking status: Never Smoker  . Smokeless tobacco: Never Used  Substance Use Topics  . Alcohol use: No  . Drug use: No    ROS Constitutional: Denies  fever, chills, weight loss/gain, headaches, insomnia,  night sweats, and change in appetite. Does c/o fatigue. Eyes: Denies redness, blurred vision, diplopia, discharge, itchy, watery eyes.  ENT: Denies discharge, congestion, post nasal drip, epistaxis, sore throat, earache, hearing loss, dental pain, Tinnitus, Vertigo, Sinus pain, snoring.  Cardio: Denies chest pain, palpitations, irregular heartbeat, syncope, dyspnea, diaphoresis, orthopnea, PND, claudication, edema Respiratory: denies cough, dyspnea, DOE, pleurisy, hoarseness, laryngitis, wheezing.  Gastrointestinal: Denies dysphagia, heartburn, reflux, water brash, pain, cramps, nausea, vomiting, bloating, diarrhea, constipation, hematemesis, melena, hematochezia, jaundice, hemorrhoids Genitourinary: Denies dysuria, frequency, urgency, nocturia, hesitancy, discharge, hematuria, flank pain Breast: Breast lumps, nipple discharge, bleeding.  Musculoskeletal: Denies arthralgia, myalgia, stiffness, Jt. Swelling, pain, limp, and strain/sprain. Denies  falls. Skin: Denies puritis, rash, hives, warts, acne, eczema, changing in skin lesion Neuro: No weakness, tremor, incoordination, spasms, paresthesia, pain Psychiatric: Denies confusion, memory loss, sensory loss. Denies Depression. Endocrine: Denies change in weight, skin, hair change, nocturia, and paresthesia, diabetic polys, visual blurring, hyper / hypo glycemic episodes.  Heme/Lymph: No excessive bleeding, bruising, enlarged lymph nodes.  Physical Exam  BP 126/80   Pulse (!) 103   Temp 97.7 F (36.5 C)   Resp 16   Ht 5' 4.5" (1.638 m)   Wt 191 lb 6.4 oz (86.8 kg)   SpO2 96%   BMI 32.35 kg/m   General Appearance: Over nourished, well groomed and in no apparent distress.  Eyes: PERRLA, EOMs, conjunctiva no swelling or erythema, normal fundi and vessels. Sinuses: No frontal/maxillary tenderness ENT/Mouth: EACs patent / TMs  nl. Nares clear without erythema, swelling, mucoid exudates.  Oral hygiene is good. No erythema, swelling, or exudate. Tongue normal, non-obstructing. Tonsils not swollen or erythematous. Hearing normal.  Neck: Supple, thyroid normal. No bruits, nodes or JVD. Respiratory: Respiratory effort normal.  BS equal and clear bilateral without rales, rhonci, wheezing or stridor. Cardio: Heart sounds are normal with regular rate and rhythm and no murmurs, rubs or gallops. Peripheral pulses are normal and equal bilaterally without edema. No aortic or femoral bruits. Chest: symmetric with normal excursions and percussion. Breasts: Symmetric, without lumps, nipple discharge, retractions, or fibrocystic changes.  Abdomen: Flat, soft with bowel sounds active. Nontender, no guarding, rebound, hernias, masses, or organomegaly.  Lymphatics: Non tender without lymphadenopathy.  Musculoskeletal: Full ROM all peripheral extremities, joint stability, 5/5 strength, and normal gait. Skin: Warm and dry without rashes, lesions, cyanosis, clubbing or  ecchymosis.  Neuro: Cranial nerves intact, reflexes equal bilaterally. Normal muscle tone, no cerebellar symptoms. Sensation intact.  Pysch: Alert and oriented X 3, normal affect, Insight and Judgment appropriate.   Assessment and Plan  1. Annual Preventative Screening Examination  2. Essential hypertension  - Urinalysis, Routine w reflex microscopic - Microalbumin / creatinine urine ratio - CBC with Differential/Platelet - BASIC METABOLIC PANEL WITH GFR - Magnesium - TSH - EKG 12-Lead  3. Hyperlipemia, mixed  - Hepatic function panel - Lipid panel - TSH  4. Prediabetes  - Hemoglobin A1c - Insulin, random  5. Vitamin D deficiency  - VITAMIN D 25 Hydroxy   6. Abnormal glucose  - Hemoglobin A1c - Insulin, random  7. Depression, major, recurrent, in partial remission ([place])   8. OSA on CPAP   9. Screening for colorectal cancer  - POC Hemoccult Bld/Stl   10. Screening for ischemic heart disease   11.  Needs flu shot  - FLU VACCINE MDCK QUAD W/Preservative  12. Medication management  - Urinalysis, Routine w reflex microscopic - Microalbumin / creatinine urine ratio - Lipid panel - Lamotrigine level      Patient was counseled in prudent diet to achieve/maintain BMI less than 25 for weight control, BP monitoring, regular exercise and medications. Discussed med's effects and SE's. Screening labs and tests as requested with regular follow-up as recommended. Over 40 minutes of exam, counseling, chart review and high complex critical decision making was performed.

## 2017-05-28 NOTE — Patient Instructions (Signed)

## 2017-06-01 LAB — HEPATIC FUNCTION PANEL
AG Ratio: 1.4 (calc) (ref 1.0–2.5)
ALBUMIN MSPROF: 4.4 g/dL (ref 3.6–5.1)
ALT: 52 U/L — AB (ref 6–29)
AST: 37 U/L — AB (ref 10–35)
Alkaline phosphatase (APISO): 88 U/L (ref 33–130)
BILIRUBIN DIRECT: 0.2 mg/dL (ref 0.0–0.2)
GLOBULIN: 3.2 g/dL (ref 1.9–3.7)
Indirect Bilirubin: 0.5 mg/dL (calc) (ref 0.2–1.2)
TOTAL PROTEIN: 7.6 g/dL (ref 6.1–8.1)
Total Bilirubin: 0.7 mg/dL (ref 0.2–1.2)

## 2017-06-01 LAB — BASIC METABOLIC PANEL WITH GFR
BUN: 18 mg/dL (ref 7–25)
CALCIUM: 10 mg/dL (ref 8.6–10.4)
CHLORIDE: 103 mmol/L (ref 98–110)
CO2: 24 mmol/L (ref 20–32)
Creat: 0.71 mg/dL (ref 0.50–0.99)
GFR, Est African American: 107 mL/min/{1.73_m2} (ref >=60)
GFR, Est Non African American: 93 mL/min/{1.73_m2} (ref >=60)
Glucose, Bld: 103 mg/dL — ABNORMAL HIGH (ref 65–99)
Potassium: 3.9 mmol/L (ref 3.5–5.3)
Sodium: 139 mmol/L (ref 135–146)

## 2017-06-01 LAB — URINALYSIS, ROUTINE W REFLEX MICROSCOPIC
Bilirubin Urine: NEGATIVE
Glucose, UA: NEGATIVE
HGB URINE DIPSTICK: NEGATIVE
KETONES UR: NEGATIVE
LEUKOCYTES UA: NEGATIVE
NITRITE: NEGATIVE
PROTEIN: NEGATIVE
Specific Gravity, Urine: 1.02 (ref 1.001–1.03)
pH: 8 (ref 5.0–8.0)

## 2017-06-01 LAB — CBC WITH DIFFERENTIAL/PLATELET
BASOS PCT: 0.4 %
Basophils Absolute: 23 cells/uL (ref 0–200)
EOS PCT: 3.5 %
Eosinophils Absolute: 200 cells/uL (ref 15–500)
HEMATOCRIT: 41.3 % (ref 35.0–45.0)
Hemoglobin: 14 g/dL (ref 11.7–15.5)
LYMPHS ABS: 2103 {cells}/uL (ref 850–3900)
MCH: 30.7 pg (ref 27.0–33.0)
MCHC: 33.9 g/dL (ref 32.0–36.0)
MCV: 90.6 fL (ref 80.0–100.0)
MPV: 10.7 fL (ref 7.5–12.5)
Monocytes Relative: 8.7 %
Neutro Abs: 2879 cells/uL (ref 1500–7800)
Neutrophils Relative %: 50.5 %
PLATELETS: 356 10*3/uL (ref 140–400)
RBC: 4.56 10*6/uL (ref 3.80–5.10)
RDW: 12.8 % (ref 11.0–15.0)
TOTAL LYMPHOCYTE: 36.9 %
WBC: 5.7 10*3/uL (ref 3.8–10.8)
WBCMIX: 496 {cells}/uL (ref 200–950)

## 2017-06-01 LAB — MICROALBUMIN / CREATININE URINE RATIO
Creatinine, Urine: 88 mg/dL (ref 20–275)
MICROALB UR: 0.2 mg/dL
MICROALB/CREAT RATIO: 2 ug/mg{creat} (ref <30)

## 2017-06-01 LAB — INSULIN, RANDOM: Insulin: 26.7 u[IU]/mL — ABNORMAL HIGH (ref 2.0–19.6)

## 2017-06-01 LAB — LIPID PANEL
CHOLESTEROL: 171 mg/dL (ref <200)
HDL: 50 mg/dL — AB (ref >50)
LDL CHOLESTEROL (CALC): 96 mg/dL
Non-HDL Cholesterol (Calc): 121 mg/dL (calc) (ref <130)
Total CHOL/HDL Ratio: 3.4 (calc) (ref <5.0)
Triglycerides: 152 mg/dL — ABNORMAL HIGH (ref <150)

## 2017-06-01 LAB — VITAMIN D 25 HYDROXY (VIT D DEFICIENCY, FRACTURES): VIT D 25 HYDROXY: 69 ng/mL (ref 30–100)

## 2017-06-01 LAB — TSH: TSH: 1.07 mIU/L (ref 0.40–4.50)

## 2017-06-01 LAB — HEMOGLOBIN A1C
EAG (MMOL/L): 6.5 (calc)
HEMOGLOBIN A1C: 5.7 %{Hb} — AB (ref <5.7)
MEAN PLASMA GLUCOSE: 117 (calc)

## 2017-06-01 LAB — MAGNESIUM: MAGNESIUM: 2 mg/dL (ref 1.5–2.5)

## 2017-06-01 LAB — LAMOTRIGINE LEVEL: Lamotrigine Lvl: 3 ug/mL — ABNORMAL LOW (ref 4.0–18.0)

## 2017-06-02 ENCOUNTER — Other Ambulatory Visit: Payer: Self-pay | Admitting: Internal Medicine

## 2017-06-02 MED ORDER — LAMOTRIGINE 150 MG PO TABS: ORAL_TABLET | ORAL | 1 refills | Status: AC

## 2017-08-20 ENCOUNTER — Other Ambulatory Visit: Payer: Self-pay | Admitting: *Deleted

## 2017-08-20 ENCOUNTER — Ambulatory Visit: Payer: Medicare Other | Admitting: Physician Assistant

## 2017-08-20 VITALS — BP 146/76 | HR 120 | Temp 97.7°F | Resp 18 | Ht 64.5 in | Wt 195.8 lb

## 2017-08-20 DIAGNOSIS — J069 Acute upper respiratory infection, unspecified: Secondary | ICD-10-CM | POA: Diagnosis not present

## 2017-08-20 MED ORDER — PREDNISONE 20 MG PO TABS: ORAL_TABLET | ORAL | 0 refills | Status: DC

## 2017-08-20 MED ORDER — FLUTICASONE FUROATE-VILANTEROL 100-25 MCG/INH IN AEPB: 1.0000 | INHALATION_SPRAY | Freq: Every day | RESPIRATORY_TRACT | 0 refills | Status: DC

## 2017-08-20 MED ORDER — AZITHROMYCIN 250 MG PO TABS: ORAL_TABLET | ORAL | 1 refills | Status: AC

## 2017-08-20 MED ORDER — IPRATROPIUM-ALBUTEROL 0.5-2.5 (3) MG/3ML IN SOLN
3.0000 mL | Freq: Once | RESPIRATORY_TRACT | Status: AC
  Administered 2017-08-20: 3 mL via RESPIRATORY_TRACT

## 2017-08-20 MED ORDER — FLUTICASONE PROPIONATE 50 MCG/ACT NA SUSP: 2.0000 | Freq: Every day | NASAL | 2 refills | Status: DC

## 2017-08-20 MED ORDER — BENZONATATE 100 MG PO CAPS: 100.0000 mg | ORAL_CAPSULE | Freq: Three times a day (TID) | ORAL | 0 refills | Status: DC | PRN

## 2017-08-20 NOTE — Patient Instructions (Addendum)
Make sure you are on an allergy pill, see below for more details. Please take the prednisone as directed below, this is NOT an antibiotic so you do NOT have to finish it. You can take it for a few days and stop it if you are doing better.   Please take the prednisone to help decrease inflammation and therefore decrease symptoms. Take it it with food to avoid GI upset. It can cause increased energy but on the other hand it can make it hard to sleep at night so please take it AT [place], it takes 8-12 hours to start working so it will NOT affect your sleeping if you take it at night with your food!!  If you are diabetic it will increase your sugars so decrease carbs and monitor your sugars closely.    Can do steroid inhaler, NEED TO DO DAILY, this is NOT a rescue inhaler so if you are acutely short of breath please use your albuterol or call 911.  Do 1 puff once a day.  Do before you brush your teeth OR wash your mouth afterwards.  IF YOU DO NOT [place] YOUR MOUTH OUT IT CAN CAUSE YEAST Can do 2 tsp vinegar with water and switch to help prevent yeast or help yeast in your mouth.   Go to the ER if any chest pain, shortness of breath, nausea, dizziness, severe HA, changes vision/speech    HOW TO TREAT VIRAL COUGH AND COLD SYMPTOMS:  -Symptoms usually last at least 1 week with the worst symptoms being around day 4.  - colds usually start with a sore throat and end with a cough, and the cough can take 2 weeks to get better.  -No antibiotics are needed for colds, flu, sore throats, cough, bronchitis UNLESS symptoms are longer than 7 days OR if you are getting better then get drastically worse.  -There are a lot of combination medications (Dayquil, Nyquil, Vicks 44, tyelnol cold and sinus, ETC). Please look at the ingredients on the back so that you are treating the correct symptoms and not doubling up on medications/ingredients.    Medicines you can use  Nasal congestion  Little Remedies  saline spray (aerosol/mist)- can try this, it is in the kids section - pseudoephedrine (Sudafed)- behind the counter, do not use if you have high blood pressure, medicine that have -D in them.  - phenylephrine (Sudafed PE) -Dextormethorphan + chlorpheniramine (Coridcidin HBP)- okay if you have high blood pressure -Oxymetazoline (Afrin) nasal spray- LIMIT to 3 days -Saline nasal spray -Neti pot (used distilled or bottled water)  Ear pain/congestion  -pseudoephedrine (sudafed) - Nasonex/flonase nasal spray  Fever  -Acetaminophen (Tyelnol) -Ibuprofen (Advil, motrin, aleve)  Sore Throat  -Acetaminophen (Tyelnol) -Ibuprofen (Advil, motrin, aleve) -Drink a lot of water -Gargle with salt water - Rest your voice (don't talk) -Throat sprays -Cough drops  Body Aches  -Acetaminophen (Tyelnol) -Ibuprofen (Advil, motrin, aleve)  Headache  -Acetaminophen (Tyelnol) -Ibuprofen (Advil, motrin, aleve) - Exedrin, Exedrin Migraine  Allergy symptoms (cough, sneeze, runny nose, itchy eyes) -Claritin or loratadine cheapest but likely the weakest  -Zyrtec or certizine at night because it can make you sleepy -The strongest is allegra or fexafinadine  Cheapest at [employer]mart, [employer], [employer]  Cough  -Dextromethorphan (Delsym)- medicine that has DM in it -Guafenesin (Mucinex/Robitussin) - cough drops - drink lots of water  Chest Congestion  -Guafenesin (Mucinex/Robitussin)  Red Itchy Eyes  - Naphcon-A  Upset Stomach  - Bland diet (nothing spicy, greasy, fried,  and high acid foods like tomatoes, oranges, berries) -OKAY- cereal, bread, soup, crackers, rice -Eat smaller more frequent meals -reduce caffeine, no alcohol -Loperamide (Imodium-AD) if diarrhea -Prevacid for heart burn  General health when sick  -Hydration -wash your hands frequently -keep surfaces clean -change pillow cases and sheets often -Get fresh air but do not exercise strenuously -Vitamin D, double up on it - Vitamin  C -Zinc

## 2017-08-20 NOTE — Progress Notes (Signed)
62 y.o. female with history of HTN, FM, obesity, OSA presents with 4-5 days of URI symptoms. She is on Flonase, Zyrtec, alk cold.  Symptoms include sinus congestion, cough, shortness of breath due to cough, non productive.   Denies fever, chills, wheezing, CP, myalgias.   Blood pressure (!) 146/76, pulse (!) 120, temperature 97.7 F (36.5 C), resp. rate 18, height 5' 4.5" (1.638 m), weight 195 lb 12.8 oz (88.8 kg).  Problem list has Essential hypertension; Mixed hyperlipidemia; Prediabetes; Vitamin D deficiency; Medication management; Fibromyalgia; Depression, major, recurrent, in partial remission ([place]); Obesity (BMI 30.5); OSA on CPAP; At low risk for fall; Osteoarthritis of both knees; BMI 30.0-30.9,adult; and Medicare annual wellness visit, subsequent on their problem list.  Medications Current Outpatient Medications on File Prior to Visit  Medication Sig  . acetaminophen (TYLENOL) 500 MG tablet Take 500-1,000 mg by mouth every 6 (six) hours as needed for pain.  Marland Kitchen acyclovir (ZOVIRAX) 400 MG tablet Take 1 tablet (400 mg total) by mouth 3 (three) times daily.  Marland Kitchen aspirin 81 MG tablet Take 81 mg by mouth daily.  . Cetirizine HCl 10 MG CAPS Take 1 capsule by mouth daily.  . Cholecalciferol (VITAMIN D PO) Take 8,000 Int'l Units by mouth daily.   . clonazePAM (KLONOPIN) 1 MG tablet Take 1 mg by mouth at bedtime.  Marland Kitchen glucose blood (ACCU-CHEK AVIVA PLUS) test strip USE AS DIRECTED DAILY  . hydrochlorothiazide (HYDRODIURIL) 25 MG tablet TAKE 1 TABLET BY MOUTH EVERY MORNING FOR BLOOD PRESSURE AND FLUID RETENTION  . Ibuprofen 200 MG CAPS Take 1 capsule by mouth. PRN  . lamoTRIgine (LAMICTAL) 150 MG tablet Take 1 & 1/2 tablets at bedtime  . Lancets (ACCU-CHEK MULTICLIX) lancets Use to check blood glucose daily  . Lysine 500 MG TABS Take by mouth daily. Currently taking 3 pill QD  . Magnesium 250 MG TABS Take 250 mg by mouth. Takes 5 pills per day  . meloxicam (MOBIC) 15 MG tablet Take 1 tablet (15 mg  total) by mouth daily.  . Multiple Vitamin (MULTIVITAMIN WITH MINERALS) TABS tablet Take 0.5 tablets by mouth 2 (two) times daily.  Marland Kitchen OVER THE COUNTER MEDICATION 2 tablets daily. OsteoBioFlex  . QUEtiapine (SEROQUEL) 100 MG tablet Take 100 mg by mouth 3 (three) times daily.  . rosuvastatin (CRESTOR) 40 MG tablet TAKE 1 TABLET BY MOUTH DAILY  . traMADol (ULTRAM) 50 MG tablet Take 1 tablet (50 mg total) by mouth every 6 (six) hours as needed.   No current facility-administered medications on file prior to visit.     Allergies: Allergies  Allergen Reactions  . Latex     Band aids= if left on for extended period of time  . Celecoxib     REACTION: Rash (celebrex)  . Cephalexin     REACTION: Rash (keflex)  . Codeine     REACTION: Rash  . Erythromycin     REACTION: Rash (emycin)  . Sulfonamide Derivatives     REACTION: Rash  . Trazodone And Nefazodone     insomnia  . Venlafaxine     REACTION: Blurred Vision (effexor)    ROS: See HPI  Physical Exam Physical Exam  Constitutional: She is oriented to person, place, and time. She appears well-developed and well-nourished.  HENT:  Right Ear: Hearing and external ear normal. No mastoid tenderness. Tympanic membrane is injected. Tympanic membrane is not perforated, not erythematous, not retracted and not bulging. A middle ear effusion is present.  Left Ear: Hearing and external  ear normal. No mastoid tenderness. Tympanic membrane is injected. Tympanic membrane is not perforated, not erythematous, not retracted and not bulging. A middle ear effusion is present.  Nose: Right sinus exhibits maxillary sinus tenderness. Left sinus exhibits maxillary sinus tenderness.  Mouth/Throat: Uvula is midline, oropharynx is clear and moist and mucous membranes are normal.  Eyes: Conjunctivae and EOM are normal. Pupils are equal, round, and reactive to light.  Neck: Neck supple.  Cardiovascular: Normal rate and regular rhythm.  Pulmonary/Chest: Effort  normal and breath sounds normal. No respiratory distress. She has no wheezes.  Abdominal: Soft. Bowel sounds are normal.  Musculoskeletal: Normal range of motion.  Lymphadenopathy:    She has no cervical adenopathy.  Neurological: She is alert and oriented to person, place, and time.  Skin: Skin is warm and dry.    Assessment and Plan: Since it has only been 4 days, we do not want to send in an antibiotic yet. We want to wait 7-10 days for you body to fight off an infection like a virus. We can treat the inflammation which will treat the symptoms. If you are not better after 7-10 days we will send in an antibiotic for you.   If you are not getting better call the office for an office visit. If you are getting worse go to the ER.   Upper respiratory tract infection, unspecified type Will hold the zpak and take if she is not getting better, increase fluids, rest, cont allergy pill -     azithromycin (ZITHROMAX) 250 MG tablet; Take 2 tablets (500 mg) on  Day 1,  followed by 1 tablet (250 mg) once daily on Days 2 through 5. -     predniSONE (DELTASONE) 20 MG tablet; 2 tablets daily for 3 days, 1 tablet daily for 4 days. -     benzonatate (TESSALON PERLES) 100 MG capsule; Take 1 capsule (100 mg total) by mouth 3 (three) times daily as needed for cough (Max: '600mg'$  per day). -     fluticasone furoate-vilanterol (BREO ELLIPTA) 100-25 MCG/INH AEPB; Inhale 1 puff into the lungs daily. Rinse mouth with water after each use -     ipratropium-albuterol (DUONEB) 0.5-2.5 (3) MG/3ML nebulizer solution 3 mL

## 2017-08-30 ENCOUNTER — Other Ambulatory Visit: Payer: Self-pay

## 2017-08-30 DIAGNOSIS — Z1211 Encounter for screening for malignant neoplasm of colon: Secondary | ICD-10-CM

## 2017-08-30 DIAGNOSIS — Z1212 Encounter for screening for malignant neoplasm of rectum: Secondary | ICD-10-CM | POA: Diagnosis not present

## 2017-08-30 LAB — POC HEMOCCULT BLD/STL (HOME/3-CARD/SCREEN)
Card #2 Fecal Occult Blod, POC: NEGATIVE
FECAL OCCULT BLD: NEGATIVE
Fecal Occult Blood, POC: NEGATIVE

## 2017-09-14 NOTE — Progress Notes (Signed)
MEDICARE ANNUAL WELLNESS VISIT AND FOLLOW UP  Assessment:   Diagnoses and all orders for this visit:  Encounter for Medicare annual wellness exam  Essential hypertension Continue medications Monitor blood pressure at home; call if consistently over 130/80 Continue DASH diet.   Reminder to go to the ER if any CP, SOB, nausea, dizziness, severe HA, changes vision/speech, left arm numbness and tingling and jaw pain.  Vitamin D deficiency At goal at recent check; continue to recommend supplementation for goal of 70-100 Defer vitamin D level  Prediabetes Discussed disease and risks Discussed diet/exercise, weight management  A1C  Obesity (BMI 30.0-34.9) Long discussion about weight loss, diet, and exercise Recommended diet heavy in fruits and veggies and low in animal meats, cheeses, and dairy products, appropriate calorie intake Discussed appropriate weight for height Follow up at next visit  Mixed hyperlipidemia Continue medications: crestor Continue low cholesterol diet and exercise.  Check lipid panel.   Medication management CBC, BMP/GFR, LFT  Fibromyalgia Lifestyle discussed: diet/exerise, sleep hygiene, stress management, hydration Continue current medications for sleep management  Depression, major, recurrent, in partial remission ([place]) Continue medications  Lifestyle discussed: diet/exerise, sleep hygiene, stress management, hydration  OSA on CPAP CPAP - reporting 100% compliance with improved restorative sleep and no excessive daytime sleepiness.   Primary osteoarthritis of both knees Followed by Dr. Berenice Primas, s/p R TKA, mobic as needed  Screening colon cancer Due in 11/2017 - will refer back to GI today to start scheduling.   Cough - ongoing for 4+ weeks, medications reviewed  Antitussives per medication orders. Avoid exposure to tobacco smoke and fumes. Chest x-ray. Trial of antihistamines. Trial of H-2 blocker. Recommended voice rest  Over 40  minutes of exam, counseling, chart review and critical decision making was performed Future Appointments  Date Time Provider [place]  01/05/2018 10:30 AM Unk Pinto, MD [facility] None  06/24/2018 11:00 AM Unk Pinto, MD [facility] None     Plan:   During the course of the visit the patient was educated and counseled about appropriate screening and preventive services including:    Pneumococcal vaccine   Prevnar 13  Influenza vaccine  Td vaccine  Screening electrocardiogram  Bone densitometry screening  Colorectal cancer screening  Diabetes screening  Glaucoma screening  Nutrition counseling   Advanced directives: requested   Subjective:  [name] is a 62 y.o. female who presents for Medicare Annual Wellness Visit and 3 month follow up. Patient has been on SS Disability since age 20 (2007) for Panic, Anxiety & Depression, and also has fibromyalgia. She is followed by PA Gareth Eagle for mental health management. She reports she is doing well, but is under a lot of stress at this time related to an emergent surgery for her aunt whom she is caring for.   She presents with a dry cough today ongoing since the end of February. She was treated by zpak, prednisone, tessalon, and a trial of breo - other symptoms have resolved other than nagging cough for which she continues to take benzonatate. She is not a smoker, no hx of GERD, denies seasonal allergies. Last xray was in 2014.   She currently uses klonopin very rarely to help with sleep (1-2 tabs per month). This is managed by mental health provider.   BMI is Body mass index is 32.95 kg/m., she has not been working on diet and exercise. Wt Readings from Last 3 Encounters:  09/15/17 195 lb (88.5 kg)  08/20/17 195 lb 12.8 oz (88.8 kg)  05/28/17 191 lb 6.4 oz (86.8 kg)    Her blood pressure has been controlled at home, today their BP is BP: 124/74 She does not workout. She denies chest pain,  shortness of breath, dizziness.   She is on cholesterol medication (crestor 20 mg MWF) and denies myalgias. Her LDL cholesterol is at goal; trigs remain borderline elevated. The cholesterol last visit was:   Lab Results  Component Value Date   CHOL 171 05/28/2017   HDL 50 (L) 05/28/2017   LDLCALC 96 05/28/2017   TRIG 152 (H) 05/28/2017   CHOLHDL 3.4 05/28/2017    She has not been working on diet and exercise for prediabetes, and denies foot ulcerations, increased appetite, nausea, paresthesia of the feet, polydipsia, polyuria, visual disturbances, vomiting and weight loss. Last A1C in the office was:  Lab Results  Component Value Date   HGBA1C 5.7 (H) 05/28/2017   Last GFR: Lab Results  Component Value Date   GFRNONAA 93 05/28/2017   Patient is on Vitamin D supplement and at goal at recent check:   Lab Results  Component Value Date   VD25OH 69 05/28/2017      Medication Review: Current Outpatient Medications on File Prior to Visit  Medication Sig Dispense Refill  . acetaminophen (TYLENOL) 500 MG tablet Take 500-1,000 mg by mouth every 6 (six) hours as needed for pain.    Marland Kitchen acyclovir (ZOVIRAX) 400 MG tablet Take 1 tablet (400 mg total) by mouth 3 (three) times daily. 21 tablet 2  . aspirin 81 MG tablet Take 81 mg by mouth daily.    . Cetirizine HCl 10 MG CAPS Take 1 capsule by mouth daily.    . Cholecalciferol (VITAMIN D PO) Take 8,000 Int'l Units by mouth daily.     . clonazePAM (KLONOPIN) 1 MG tablet Take 1 mg by mouth at bedtime.    . fluticasone (FLONASE) 50 MCG/ACT nasal spray Place 2 sprays into both nostrils daily. 16 g 2  . glucose blood (ACCU-CHEK AVIVA PLUS) test strip USE AS DIRECTED DAILY 100 each 1  . hydrochlorothiazide (HYDRODIURIL) 25 MG tablet TAKE 1 TABLET BY MOUTH EVERY MORNING FOR BLOOD PRESSURE AND FLUID RETENTION 90 tablet 1  . Ibuprofen 200 MG CAPS Take 1 capsule by mouth. PRN    . lamoTRIgine (LAMICTAL) 150 MG tablet Take 1 & 1/2 tablets at bedtime 135  tablet 1  . Lancets (ACCU-CHEK MULTICLIX) lancets Use to check blood glucose daily 100 each PRN  . Lysine 500 MG TABS Take by mouth daily. Currently taking 3 pill QD    . Magnesium 250 MG TABS Take 400 mg by mouth. Takes 3 pills per day    . meloxicam (MOBIC) 15 MG tablet Take 1 tablet (15 mg total) by mouth daily. 90 tablet 99  . Multiple Vitamin (MULTIVITAMIN WITH MINERALS) TABS tablet Take 0.5 tablets by mouth 2 (two) times daily.    Marland Kitchen OVER THE COUNTER MEDICATION 2 tablets daily. OsteoBioFlex    . QUEtiapine (SEROQUEL) 100 MG tablet Take 100 mg by mouth 3 (three) times daily.    . rosuvastatin (CRESTOR) 40 MG tablet TAKE 1 TABLET BY MOUTH DAILY (Patient taking differently: TAKE 1/2 TABLET BY MOUTH ON MON, WED, & FRIDAYS) 90 tablet 0   No current facility-administered medications on file prior to visit.     Allergies  Allergen Reactions  . Latex     Band aids= if left on for extended period of time  . Celecoxib  REACTION: Rash (celebrex)  . Cephalexin     REACTION: Rash (keflex)  . Codeine     REACTION: Rash  . Erythromycin     REACTION: Rash (emycin)  . Sulfonamide Derivatives     REACTION: Rash  . Trazodone And Nefazodone     insomnia  . Venlafaxine     REACTION: Blurred Vision Insurance claims handler)    Current Problems (verified) Patient Active Problem List   Diagnosis Date Noted  . Osteoarthritis of both knees 03/20/2015  . Encounter for Medicare annual wellness exam 03/20/2015  . OSA on CPAP 08/15/2014  . Medication management 01/31/2014  . Fibromyalgia 01/31/2014  . Depression, major, recurrent, in partial remission ([place]) 01/31/2014  . Obesity (BMI 30.0-34.9) 01/31/2014  . Essential hypertension 08/09/2013  . Mixed hyperlipidemia 08/09/2013  . Prediabetes 08/09/2013  . Vitamin D deficiency 08/09/2013    Screening Tests Immunization History  Administered Date(s) Administered  . DT 07/05/2015  . Influenza Inj Mdck Quad With Preservative 05/28/2017  . Influenza Split  04/06/2012, 04/05/2013, 05/16/2014, 03/21/2015  . Influenza,inj,quad, With Preservative 04/29/2016  . Pneumococcal Conjugate-13 05/16/2014  . Pneumococcal-Unspecified 06/22/1998  . Td 06/22/2004  . Zoster 10/21/2015   Preventative care: Last colonoscopy: 2009 - will refer back for follow up  Mammogram:  2018 DEXA: 2013 normal - would like to postpone Pap:  S/p Hysterectomy  Prior vaccinations: TD or Tdap: 2017 Influenza: 2018 Pneumococcal: 2000 Prevnar13: 2015 Shingles/Zostavax: 2017  Names of Other Physician/Practitioners you currently use: 1. [facility] here for primary care 2. Dr. Tye Savoy, eye doctor, last visit 2018 3. Dr. Marlou Sa, dentist, last visit 2018   Patient Care Team: Unk Pinto, MD as PCP - General (Internal Medicine) Dorna Leitz, MD as Consulting Physician (Orthopedic Surgery) Milus Banister, MD as Attending Physician (Gastroenterology) Sheralyn Boatman, MD as Consulting Physician (Psychiatry) [name], [place] (Optometry) [name], DDS as Referring Physician (Dentistry)  SURGICAL HISTORY She  has a past surgical history that includes Abdominal hysterectomy (2002); Joint replacement; and Total knee arthroplasty (Right, 04/14/2013). FAMILY HISTORY Her family history includes COPD in her father; CVA in her maternal grandmother; Cancer in her father; Hypertension in her father and mother. SOCIAL HISTORY She  reports that she has never smoked. She has never used smokeless tobacco. She reports that she does not drink alcohol or use drugs.   MEDICARE WELLNESS OBJECTIVES: Physical activity: Current Exercise Habits: The patient does not participate in regular exercise at present, Exercise limited by: orthopedic condition(s)(Knee pain) Cardiac risk factors: Cardiac Risk Factors include: advanced age (>64men, >82 women);dyslipidemia;hypertension;sedentary lifestyle;obesity (BMI >30kg/m2) Depression/mood screen:    Depression screen [facility] 2/9 09/15/2017  Decreased Interest 0  Down, Depressed, Hopeless 1  PHQ - 2 Score 1  Altered sleeping -  Tired, decreased energy -  Change in appetite -  Feeling bad or failure about yourself  -  Trouble concentrating -  Moving slowly or fidgety/restless -  Suicidal thoughts -  PHQ-9 Score -    ADLs:  In your present state of health, do you have any difficulty performing the following activities: 09/15/2017 05/28/2017  Hearing? N N  Vision? N N  Difficulty concentrating or making decisions? N N  Walking or climbing stairs? Y N  Comment Bilateral knee arthritis -  Dressing or bathing? N N  Doing errands, shopping? N N  Some recent data might be hidden     Cognitive Testing  Alert? Yes  Normal Appearance?Yes  Oriented to person? Yes  Place? Yes  Time? Yes  Recall of three objects?  Yes  Can perform simple calculations? Yes  Displays appropriate judgment?Yes  Can read the correct time from a watch face?Yes  EOL planning: Does Patient Have a Medical Advance Directive?: No Would patient like information on creating a medical advance directive?: No - Patient declined(Already has paperwork, just needs to complete)  Review of Systems  Constitutional: Negative for malaise/fatigue and weight loss.  HENT: Negative for congestion, hearing loss, sore throat and tinnitus.   Eyes: Negative for blurred vision and double vision.  Respiratory: Positive for cough. Negative for sputum production, shortness of breath and wheezing.   Cardiovascular: Negative for chest pain, palpitations, orthopnea, claudication, leg swelling and PND.  Gastrointestinal: Negative for abdominal pain, blood in stool, constipation, diarrhea, heartburn, melena, nausea and vomiting.  Genitourinary: Negative.   Musculoskeletal: Positive for joint pain (Bilateral knees, arthritis). Negative for falls and myalgias.  Skin: Negative for rash.  Neurological: Negative for dizziness, tingling, sensory  change, weakness and headaches.  Endo/Heme/Allergies: Negative for environmental allergies and polydipsia.  Psychiatric/Behavioral: Negative.  Negative for depression, memory loss, substance abuse and suicidal ideas. The patient is not nervous/anxious and does not have insomnia.   All other systems reviewed and are negative.    Objective:     Today's Vitals   09/15/17 0838  BP: 124/74  Pulse: 83  Temp: (!) 97.5 F (36.4 C)  SpO2: 97%  Weight: 195 lb (88.5 kg)  Height: 5' 4.5" (1.638 m)   Body mass index is 32.95 kg/m.  General appearance: alert, no distress, WD/WN, female HEENT: normocephalic, sclerae anicteric, TMs pearly, nares patent, no discharge or erythema, pharynx normal Oral cavity: MMM, no lesions Neck: supple, no lymphadenopathy, no thyromegaly, no masses Heart: RRR, normal S1, S2, no murmurs Lungs: CTA bilaterally, no wheezes, rhonchi, or rales Abdomen: +bs, soft, non tender, non distended, no masses, no hepatomegaly, no splenomegaly Musculoskeletal: nontender, no swelling, no obvious deformity Extremities: no edema, no cyanosis, no clubbing Pulses: 2+ symmetric, upper and lower extremities, normal cap refill Neurological: alert, oriented x 3, CN2-12 intact, strength normal upper extremities and lower extremities, sensation normal throughout, DTRs 2+ throughout, no cerebellar signs, gait normal Psychiatric: normal affect, behavior normal, pleasant, somewhat anxious   Medicare Attestation I have personally reviewed: The patient's medical and social history Their use of alcohol, tobacco or illicit drugs Their current medications and supplements The patient's functional ability including ADLs,fall risks, home safety risks, cognitive, and hearing and visual impairment Diet and physical activities Evidence for depression or mood disorders  The patient's weight, height, BMI, and visual acuity have been recorded in the chart.  I have made referrals, counseling, and  provided education to the patient based on review of the above and I have provided the patient with a written personalized care plan for preventive services.     Izora Ribas, NP   09/15/2017

## 2017-09-14 NOTE — Progress Notes (Deleted)
FOLLOW UP  Assessment and Plan:   Hypertension Well controlled with current medications  Monitor blood pressure at home; patient to call if consistently greater than 130/80 Continue DASH diet.   Reminder to go to the ER if any CP, SOB, nausea, dizziness, severe HA, changes vision/speech, left arm numbness and tingling and jaw pain.  Cholesterol Currently at goal; continue with statin  Continue low cholesterol diet and exercise.  Check lipid panel.   Prediabetes Continue diet and exercise.  Perform daily foot/skin check, notify office of any concerning changes.  Check A1C  Obesity with co morbidities Long discussion about weight loss, diet, and exercise Recommended diet heavy in fruits and veggies and low in animal meats, cheeses, and dairy products, appropriate calorie intake Discussed ideal weight for height (below ***) and initial weight goal (***) Patient will work on *** Will follow up in 3 months  Vitamin D Def At goal at last visit; continue supplementation to maintain goal of 70-100 Defer Vit D level  Depression/fibromyalgia Fairly controlled; continue medications  Lifestyle discussed: diet/exerise, sleep hygiene, stress management, hydration  High risk medication use ***  Continue diet and meds as discussed. Further disposition pending results of labs. Discussed med's effects and SE's.   Over 30 minutes of exam, counseling, chart review, and critical decision making was performed.   Future Appointments  Date Time Provider [place]  09/15/2017  8:45 AM Liane Comber, NP [facility] None  01/05/2018 10:30 AM Unk Pinto, MD [facility] None  06/24/2018 11:00 AM Unk Pinto, MD [facility] None    ----------------------------------------------------------------------------------------------------------------------  HPI 62 y.o. female  presents for 3 month follow up on hypertension, cholesterol, prediabetes, obesity and vitamin D deficiency.  Patient has been on [employer] Disability since age 53 (2007) for Panic, Anxiety & Depression, and also has fibromyalgia ***  Tramadol and klonopin  Patient is on opioids as well as benzos, due to new guidelines we discussed decreasing benzo use. Went into great detail and length that new studies show that unintential overdose was most likely to occur with concurrent benzo use, that opioids are better acute and short term pain management and long term recent studies show that patients on long term opioid use have worse outcomes and more complications than other medications.    BMI is There is no height or weight on file to calculate BMI., she {HAS HAS OAC:16606} been working on diet and exercise. Wt Readings from Last 3 Encounters:  08/20/17 195 lb 12.8 oz (88.8 kg)  05/28/17 191 lb 6.4 oz (86.8 kg)  02/17/17 194 lb 6.4 oz (88.2 kg)   Her blood pressure {HAS HAS NOT:18834} been controlled at home, today their BP is    She {DOES_DOES TKZ:60109} workout. She denies chest pain, shortness of breath, dizziness.   She is on cholesterol medication (crestor 40 mg *** ) and denies myalgias. Her LDL cholesterol is at goal; trigs demonstrate minor elevation. The cholesterol last visit was:   Lab Results  Component Value Date   CHOL 171 05/28/2017   HDL 50 (L) 05/28/2017   LDLCALC 96 05/28/2017   TRIG 152 (H) 05/28/2017   CHOLHDL 3.4 05/28/2017    She {Has/has not:18111} been working on diet and exercise for prediabetes, and denies {Symptoms; diabetes w/o none:19199}. Last A1C in the office was:  Lab Results  Component Value Date   HGBA1C 5.7 (H) 05/28/2017   Patient is on Vitamin D supplement and at goal at recent check:   Lab Results  Component Value Date  VD25OH 69 05/28/2017        Current Medications:  Current Outpatient Medications on File Prior to Visit  Medication Sig  . acetaminophen (TYLENOL) 500 MG tablet Take 500-1,000 mg by mouth every 6 (six) hours as needed for pain.  Marland Kitchen  acyclovir (ZOVIRAX) 400 MG tablet Take 1 tablet (400 mg total) by mouth 3 (three) times daily.  Marland Kitchen aspirin 81 MG tablet Take 81 mg by mouth daily.  . benzonatate (TESSALON PERLES) 100 MG capsule Take 1 capsule (100 mg total) by mouth 3 (three) times daily as needed for cough (Max: 600mg  per day).  . Cetirizine HCl 10 MG CAPS Take 1 capsule by mouth daily.  . Cholecalciferol (VITAMIN D PO) Take 8,000 Int'l Units by mouth daily.   . clonazePAM (KLONOPIN) 1 MG tablet Take 1 mg by mouth at bedtime.  . fluticasone (FLONASE) 50 MCG/ACT nasal spray Place 2 sprays into both nostrils daily.  . fluticasone furoate-vilanterol (BREO ELLIPTA) 100-25 MCG/INH AEPB Inhale 1 puff into the lungs daily. Rinse mouth with water after each use  . glucose blood (ACCU-CHEK AVIVA PLUS) test strip USE AS DIRECTED DAILY  . hydrochlorothiazide (HYDRODIURIL) 25 MG tablet TAKE 1 TABLET BY MOUTH EVERY MORNING FOR BLOOD PRESSURE AND FLUID RETENTION  . Ibuprofen 200 MG CAPS Take 1 capsule by mouth. PRN  . lamoTRIgine (LAMICTAL) 150 MG tablet Take 1 & 1/2 tablets at bedtime  . Lancets (ACCU-CHEK MULTICLIX) lancets Use to check blood glucose daily  . Lysine 500 MG TABS Take by mouth daily. Currently taking 3 pill QD  . Magnesium 250 MG TABS Take 250 mg by mouth. Takes 5 pills per day  . meloxicam (MOBIC) 15 MG tablet Take 1 tablet (15 mg total) by mouth daily.  . Multiple Vitamin (MULTIVITAMIN WITH MINERALS) TABS tablet Take 0.5 tablets by mouth 2 (two) times daily.  Marland Kitchen OVER THE COUNTER MEDICATION 2 tablets daily. OsteoBioFlex  . predniSONE (DELTASONE) 20 MG tablet 2 tablets daily for 3 days, 1 tablet daily for 4 days.  Marland Kitchen QUEtiapine (SEROQUEL) 100 MG tablet Take 100 mg by mouth 3 (three) times daily.  . rosuvastatin (CRESTOR) 40 MG tablet TAKE 1 TABLET BY MOUTH DAILY  . traMADol (ULTRAM) 50 MG tablet Take 1 tablet (50 mg total) by mouth every 6 (six) hours as needed.   No current facility-administered medications on file prior to  visit.      Allergies:  Allergies  Allergen Reactions  . Latex     Band aids= if left on for extended period of time  . Celecoxib     REACTION: Rash (celebrex)  . Cephalexin     REACTION: Rash (keflex)  . Codeine     REACTION: Rash  . Erythromycin     REACTION: Rash (emycin)  . Sulfonamide Derivatives     REACTION: Rash  . Trazodone And Nefazodone     insomnia  . Venlafaxine     REACTION: Blurred Vision Insurance claims handler)     Medical History:  Past Medical History:  Diagnosis Date  . Arthritis   . Depression   . Elevated hemoglobin A1c   . Fibromyalgia   . Hypertension   . Other abnormal glucose 08/09/2013  . Sleep apnea    cpap   > 4 yrs  last sleep study  . Unspecified essential hypertension 08/09/2013  . Unspecified vitamin D deficiency 08/09/2013  . Vitamin D deficiency    Family history- Reviewed and unchanged Social history- Reviewed and unchanged   Review  of Systems:  Review of Systems  Constitutional: Negative for malaise/fatigue and weight loss.  HENT: Negative for hearing loss and tinnitus.   Eyes: Negative for blurred vision and double vision.  Respiratory: Negative for cough, shortness of breath and wheezing.   Cardiovascular: Negative for chest pain, palpitations, orthopnea, claudication and leg swelling.  Gastrointestinal: Negative for abdominal pain, blood in stool, constipation, diarrhea, heartburn, melena, nausea and vomiting.  Genitourinary: Negative.   Musculoskeletal: Negative for joint pain and myalgias.  Skin: Negative for rash.  Neurological: Negative for dizziness, tingling, sensory change, weakness and headaches.  Endo/Heme/Allergies: Negative for polydipsia.  Psychiatric/Behavioral: Negative.   All other systems reviewed and are negative.     Physical Exam: There were no vitals taken for this visit. Wt Readings from Last 3 Encounters:  08/20/17 195 lb 12.8 oz (88.8 kg)  05/28/17 191 lb 6.4 oz (86.8 kg)  02/17/17 194 lb 6.4 oz (88.2 kg)    General Appearance: Well nourished, in no apparent distress. Eyes: PERRLA, EOMs, conjunctiva no swelling or erythema Sinuses: No Frontal/maxillary tenderness ENT/Mouth: Ext aud canals clear, TMs without erythema, bulging. No erythema, swelling, or exudate on post pharynx.  Tonsils not swollen or erythematous. Hearing normal.  Neck: Supple, thyroid normal.  Respiratory: Respiratory effort normal, BS equal bilaterally without rales, rhonchi, wheezing or stridor.  Cardio: RRR with no MRGs. Brisk peripheral pulses without edema.  Abdomen: Soft, + BS.  Non tender, no guarding, rebound, hernias, masses. Lymphatics: Non tender without lymphadenopathy.  Musculoskeletal: Full ROM, 5/5 strength, {PSY - GAIT AND STATION:22860} gait Skin: Warm, dry without rashes, lesions, ecchymosis.  Neuro: Cranial nerves intact. No cerebellar symptoms.  Psych: Awake and oriented X 3, normal affect, Insight and Judgment appropriate.    Izora Ribas, NP 10:21 AM [facility]

## 2017-09-15 ENCOUNTER — Ambulatory Visit (HOSPITAL_COMMUNITY)
Admission: RE | Admit: 2017-09-15 | Discharge: 2017-09-15 | Disposition: A | Discharge status: Home / Self Care | Payer: Medicare Other | Source: Ambulatory Visit | Attending: Adult Health | Admitting: Adult Health

## 2017-09-15 ENCOUNTER — Ambulatory Visit: Payer: Medicare Other | Admitting: Adult Health

## 2017-09-15 ENCOUNTER — Encounter: Payer: Self-pay | Admitting: Adult Health

## 2017-09-15 VITALS — BP 124/74 | HR 83 | Temp 97.5°F | Ht 64.5 in | Wt 195.0 lb

## 2017-09-15 DIAGNOSIS — Z9989 Dependence on other enabling machines and devices: Secondary | ICD-10-CM

## 2017-09-15 DIAGNOSIS — J069 Acute upper respiratory infection, unspecified: Secondary | ICD-10-CM

## 2017-09-15 DIAGNOSIS — E669 Obesity, unspecified: Secondary | ICD-10-CM | POA: Diagnosis not present

## 2017-09-15 DIAGNOSIS — R05 Cough: Secondary | ICD-10-CM | POA: Insufficient documentation

## 2017-09-15 DIAGNOSIS — J9811 Atelectasis: Secondary | ICD-10-CM | POA: Insufficient documentation

## 2017-09-15 DIAGNOSIS — R7303 Prediabetes: Secondary | ICD-10-CM

## 2017-09-15 DIAGNOSIS — Z0001 Encounter for general adult medical examination with abnormal findings: Secondary | ICD-10-CM | POA: Diagnosis not present

## 2017-09-15 DIAGNOSIS — M797 Fibromyalgia: Secondary | ICD-10-CM

## 2017-09-15 DIAGNOSIS — Z1211 Encounter for screening for malignant neoplasm of colon: Secondary | ICD-10-CM

## 2017-09-15 DIAGNOSIS — F3341 Major depressive disorder, recurrent, in partial remission: Secondary | ICD-10-CM | POA: Diagnosis not present

## 2017-09-15 DIAGNOSIS — R6889 Other general symptoms and signs: Secondary | ICD-10-CM

## 2017-09-15 DIAGNOSIS — Z0000 Encounter for general adult medical examination without abnormal findings: Secondary | ICD-10-CM

## 2017-09-15 DIAGNOSIS — G4733 Obstructive sleep apnea (adult) (pediatric): Secondary | ICD-10-CM

## 2017-09-15 DIAGNOSIS — M170 Bilateral primary osteoarthritis of knee: Secondary | ICD-10-CM

## 2017-09-15 DIAGNOSIS — R059 Cough, unspecified: Secondary | ICD-10-CM

## 2017-09-15 DIAGNOSIS — E559 Vitamin D deficiency, unspecified: Secondary | ICD-10-CM

## 2017-09-15 DIAGNOSIS — E782 Mixed hyperlipidemia: Secondary | ICD-10-CM | POA: Diagnosis not present

## 2017-09-15 DIAGNOSIS — I10 Essential (primary) hypertension: Secondary | ICD-10-CM

## 2017-09-15 DIAGNOSIS — Z79899 Other long term (current) drug therapy: Secondary | ICD-10-CM

## 2017-09-15 MED ORDER — BENZONATATE 200 MG PO CAPS: 200.0000 mg | ORAL_CAPSULE | Freq: Three times a day (TID) | ORAL | 1 refills | Status: DC | PRN

## 2017-09-15 NOTE — Patient Instructions (Signed)
Aim for 7+ servings of fruits and vegetables daily  80+ fluid ounces of water or unsweet tea for healthy kidneys  Limit alcohol intake  Limit animal fats in diet for cholesterol and heart health - choose grass fed whenever available  Aim for low stress - take time to unwind and care for your mental health  Aim for 150 min of moderate intensity exercise weekly for heart health, and weights twice weekly for bone health  Aim for 7-9 hours of sleep daily    Common causes of cough OR hoarseness OR sore throat:   Allergies, Viral Infections, Acid Reflux and Bacterial Infections.   Allergies and viral infections cause a cough OR sore throat by post nasal drip and are often worse at night, can also have sneezing, lower grade fevers, clear/yellow mucus. This is best treated with allergy medications or nasal sprays.  Please get on allegra for 1-2 weeks The strongest is allegra or fexafinadine  Cheapest at [employer], [employer], [employer]   Bacterial infections are more severe than allergies or viral infections with fever, teeth pain, fatigue. This can be treated with prednisone and the same over the counter medication and after 7 days can be treated with an antibiotic.   Silent reflux/GERD can cause a cough OR sore throat OR hoarseness WITHOUT heart burn because the esophagus that goes to the stomach and trachea that goes to the lungs are very close and when you lay down the acid can irritate your throat and lungs. This can cause hoarseness, cough, and wheezing. Please stop any alcohol or anti-inflammatories like aleve/advil/ibuprofen and start an over the counter Prilosec or omeprazole 1-2 times daily 67mins before food for 2 weeks, then switch to over the counter zantac/ratinidine or pepcid/famotadine once at night for 2 weeks.    sometimes irritation causes more irritation. Try voice rest, use sugar free cough drops to prevent coughing, and try to stop clearing your throat.   If you ever have a cough  that does not go away after trying these things please make a follow up visit for further evaluation or we can refer you to a specialist. Or if you ever have shortness of breath or chest pain go to the ER.

## 2017-09-16 LAB — CBC WITH DIFFERENTIAL/PLATELET
BASOS ABS: 31 {cells}/uL (ref 0–200)
Basophils Relative: 0.7 %
EOS PCT: 3.4 %
Eosinophils Absolute: 150 cells/uL (ref 15–500)
HCT: 39.5 % (ref 35.0–45.0)
HEMOGLOBIN: 13.7 g/dL (ref 11.7–15.5)
Lymphs Abs: 1795 cells/uL (ref 850–3900)
MCH: 30.8 pg (ref 27.0–33.0)
MCHC: 34.7 g/dL (ref 32.0–36.0)
MCV: 88.8 fL (ref 80.0–100.0)
MONOS PCT: 10.6 %
MPV: 10.4 fL (ref 7.5–12.5)
NEUTROS ABS: 1958 {cells}/uL (ref 1500–7800)
Neutrophils Relative %: 44.5 %
Platelets: 335 10*3/uL (ref 140–400)
RBC: 4.45 10*6/uL (ref 3.80–5.10)
RDW: 13 % (ref 11.0–15.0)
Total Lymphocyte: 40.8 %
WBC mixed population: 466 cells/uL (ref 200–950)
WBC: 4.4 10*3/uL (ref 3.8–10.8)

## 2017-09-16 LAB — HEPATIC FUNCTION PANEL
AG RATIO: 1.4 (calc) (ref 1.0–2.5)
ALKALINE PHOSPHATASE (APISO): 88 U/L (ref 33–130)
ALT: 33 U/L — ABNORMAL HIGH (ref 6–29)
AST: 22 U/L (ref 10–35)
Albumin: 4.3 g/dL (ref 3.6–5.1)
BILIRUBIN DIRECT: 0.1 mg/dL (ref 0.0–0.2)
BILIRUBIN INDIRECT: 0.6 mg/dL (ref 0.2–1.2)
BILIRUBIN TOTAL: 0.7 mg/dL (ref 0.2–1.2)
Globulin: 3 g/dL (calc) (ref 1.9–3.7)
TOTAL PROTEIN: 7.3 g/dL (ref 6.1–8.1)

## 2017-09-16 LAB — LIPID PANEL
CHOL/HDL RATIO: 3.2 (calc) (ref <5.0)
Cholesterol: 155 mg/dL (ref <200)
HDL: 48 mg/dL — AB (ref >50)
LDL Cholesterol (Calc): 85 mg/dL (calc)
NON-HDL CHOLESTEROL (CALC): 107 mg/dL (ref <130)
Triglycerides: 123 mg/dL (ref <150)

## 2017-09-16 LAB — BASIC METABOLIC PANEL WITH GFR
BUN: 15 mg/dL (ref 7–25)
CO2: 28 mmol/L (ref 20–32)
CREATININE: 0.73 mg/dL (ref 0.50–0.99)
Calcium: 10.1 mg/dL (ref 8.6–10.4)
Chloride: 105 mmol/L (ref 98–110)
GFR, EST AFRICAN AMERICAN: 104 mL/min/{1.73_m2} (ref >=60)
GFR, EST NON AFRICAN AMERICAN: 90 mL/min/{1.73_m2} (ref >=60)
Glucose, Bld: 109 mg/dL — ABNORMAL HIGH (ref 65–99)
Potassium: 4.4 mmol/L (ref 3.5–5.3)
SODIUM: 141 mmol/L (ref 135–146)

## 2017-09-16 LAB — HEMOGLOBIN A1C
EAG (MMOL/L): 6.6 (calc)
HEMOGLOBIN A1C: 5.8 %{Hb} — AB (ref <5.7)
Mean Plasma Glucose: 120 (calc)

## 2017-09-16 LAB — TSH: TSH: 1.53 m[IU]/L (ref 0.40–4.50)

## 2017-09-17 ENCOUNTER — Encounter: Payer: Self-pay | Admitting: Gastroenterology

## 2017-09-17 NOTE — Telephone Encounter (Signed)
Error

## 2017-10-27 ENCOUNTER — Other Ambulatory Visit: Payer: Self-pay | Admitting: Internal Medicine

## 2017-11-03 ENCOUNTER — Other Ambulatory Visit: Payer: Self-pay | Admitting: Internal Medicine

## 2017-11-03 DIAGNOSIS — Z1231 Encounter for screening mammogram for malignant neoplasm of breast: Secondary | ICD-10-CM

## 2017-11-16 ENCOUNTER — Other Ambulatory Visit: Payer: Self-pay | Admitting: *Deleted

## 2017-11-16 MED ORDER — GLUCOSE BLOOD VI STRP: ORAL_STRIP | 1 refills | Status: DC

## 2017-11-17 ENCOUNTER — Ambulatory Visit (AMBULATORY_SURGERY_CENTER): Payer: Self-pay | Admitting: *Deleted

## 2017-11-17 VITALS — Ht 65.0 in | Wt 195.0 lb

## 2017-11-17 DIAGNOSIS — Z1211 Encounter for screening for malignant neoplasm of colon: Secondary | ICD-10-CM

## 2017-11-17 MED ORDER — PEG 3350-KCL-NA BICARB-NACL 420 G PO SOLR: 4000.0000 mL | Freq: Once | ORAL | 0 refills | Status: AC

## 2017-11-17 NOTE — Progress Notes (Signed)
Patient denies any allergies to eggs or soy. Patient denies any problems with anesthesia/sedation. Patient denies any oxygen use at home. Patient denies taking any diet/weight loss medications or blood thinners. EMMI education assisgned to patient on colonoscopy, this was explained and instructions given to patient. 

## 2017-11-19 ENCOUNTER — Encounter: Payer: Self-pay | Admitting: Gastroenterology

## 2017-12-01 ENCOUNTER — Other Ambulatory Visit: Payer: Self-pay

## 2017-12-01 ENCOUNTER — Encounter: Payer: Self-pay | Admitting: Gastroenterology

## 2017-12-01 ENCOUNTER — Ambulatory Visit (AMBULATORY_SURGERY_CENTER): Payer: Medicare Other | Admitting: Gastroenterology

## 2017-12-01 VITALS — BP 132/85 | HR 69 | Temp 98.4°F | Resp 22 | Ht 65.0 in | Wt 195.0 lb

## 2017-12-01 DIAGNOSIS — K5730 Diverticulosis of large intestine without perforation or abscess without bleeding: Secondary | ICD-10-CM

## 2017-12-01 DIAGNOSIS — Z1211 Encounter for screening for malignant neoplasm of colon: Secondary | ICD-10-CM

## 2017-12-01 DIAGNOSIS — K5140 Inflammatory polyps of colon without complications: Secondary | ICD-10-CM | POA: Diagnosis not present

## 2017-12-01 DIAGNOSIS — D120 Benign neoplasm of cecum: Secondary | ICD-10-CM

## 2017-12-01 DIAGNOSIS — K635 Polyp of colon: Secondary | ICD-10-CM

## 2017-12-01 MED ORDER — SODIUM CHLORIDE 0.9 % IV SOLN: 500.0000 mL | Freq: Once | INTRAVENOUS | Status: DC

## 2017-12-01 NOTE — Progress Notes (Signed)
Called to room to assist during endoscopic procedure.  Patient ID and intended procedure confirmed with present staff. Received instructions for my participation in the procedure from the performing physician.  

## 2017-12-01 NOTE — Progress Notes (Signed)
Report to PACU, RN, vss, BBS= Clear.  

## 2017-12-01 NOTE — Patient Instructions (Addendum)
**  Handouts given on polyps and Diverticulosis**  *NO aspirin or NSAIDS (motrin, advil, naproxen, aleve, etc) for 10 days*   YOU HAD AN ENDOSCOPIC PROCEDURE TODAY: Refer to the procedure report and other information in the discharge instructions given to you for any specific questions about what was found during the examination. If this information does not answer your questions, please call [place] office at [phone] to clarify.   YOU SHOULD EXPECT: Some feelings of bloating in the abdomen. Passage of more gas than usual. Walking can help get rid of the air that was put into your GI tract during the procedure and reduce the bloating. If you had a lower endoscopy (such as a colonoscopy or flexible sigmoidoscopy) you may notice spotting of blood in your stool or on the toilet paper. Some abdominal soreness may be present for a day or two, also.  DIET: Your first meal following the procedure should be a light meal and then it is ok to progress to your normal diet. A half-sandwich or bowl of soup is an example of a good first meal. Heavy or fried foods are harder to digest and may make you feel nauseous or bloated. Drink plenty of fluids but you should avoid alcoholic beverages for 24 hours. If you had a esophageal dilation, please see attached instructions for diet.    ACTIVITY: Your care partner should take you home directly after the procedure. You should plan to take it easy, moving slowly for the rest of the day. You can resume normal activity the day after the procedure however YOU SHOULD NOT DRIVE, use power tools, machinery or perform tasks that involve climbing or major physical exertion for 24 hours (because of the sedation medicines used during the test).   SYMPTOMS TO REPORT IMMEDIATELY: A gastroenterologist can be reached at any hour. Please call [phone]  for any of the following symptoms:  Following lower endoscopy (colonoscopy, flexible sigmoidoscopy) Excessive amounts of blood in  the stool  Significant tenderness, worsening of abdominal pains  Swelling of the abdomen that is new, acute  Fever of 100 or higher    FOLLOW UP:  If any biopsies were taken you will be contacted by phone or by letter within the next 1-3 weeks. Call [phone]  if you have not heard about the biopsies in 3 weeks.  Please also call with any specific questions about appointments or follow up tests.

## 2017-12-01 NOTE — Op Note (Signed)
[place] Patient Name: [name] Procedure Date: 12/01/2017 8:52 AM MRN: [id number] Endoscopist: Milus Banister , MD Age: 62 Referring MD:  Date of Birth: 29-May-1956 Gender: Female Account #: [id number] Procedure:                Colonoscopy Indications:              Screening for colorectal malignant neoplasm Medicines:                Monitored Anesthesia Care Procedure:                Pre-Anesthesia Assessment:                           - Prior to the procedure, a History and Physical                            was performed, and patient medications and                            allergies were reviewed. The patient's tolerance of                            previous anesthesia was also reviewed. The risks                            and benefits of the procedure and the sedation                            options and risks were discussed with the patient.                            All questions were answered, and informed consent                            was obtained. Prior Anticoagulants: The patient has                            taken no previous anticoagulant or antiplatelet                            agents. ASA Grade Assessment: II - A patient with                            mild systemic disease. After reviewing the risks                            and benefits, the patient was deemed in                            satisfactory condition to undergo the procedure.                           After obtaining informed consent, the colonoscope  was passed under direct vision. Throughout the                            procedure, the patient's blood pressure, pulse, and                            oxygen saturations were monitored continuously. The                            Model PCF-[id number]) scope was introduced                            through the anus and advanced to the the cecum,                            identified by  appendiceal orifice and ileocecal                            valve. The colonoscopy was performed without                            difficulty. The patient tolerated the procedure                            well. The quality of the bowel preparation was                            good. The ileocecal valve, appendiceal orifice, and                            rectum were photographed. Scope In: 8:56:33 AM Scope Out: 9:10:02 AM Scope Withdrawal Time: 0 hours 10 minutes 42 seconds  Total Procedure Duration: 0 hours 13 minutes 29 seconds  Findings:                 A 12 mm polyp was found in the cecum. The polyp was                            semi-pedunculated. The polyp was removed with a hot                            snare. Resection and retrieval were complete.                           Many small and large-mouthed diverticula were found                            in the left colon.                           The exam was otherwise without abnormality on                            direct and retroflexion views. Complications:  No immediate complications. Estimated blood loss:                            None. Estimated Blood Loss:     Estimated blood loss: none. Impression:               - One 12 mm polyp in the cecum, removed with a hot                            snare. Resected and retrieved.                           - Diverticulosis in the left colon.                           - The examination was otherwise normal on direct                            and retroflexion views. Recommendation:           - Patient has a contact number available for                            emergencies. The signs and symptoms of potential                            delayed complications were discussed with the                            patient. Return to normal activities tomorrow.                            Written discharge instructions were provided to the                             patient.                           - Resume previous diet.                           - Continue present medications.                           You will receive a letter within 2-3 weeks with the                            pathology results and my final recommendations.                           If the polyp(s) is proven to be 'pre-cancerous' on                            pathology, you will need repeat colonoscopy in 3  years. If the polyp(s) is NOT 'precancerous' on                            pathology then you should repeat colon cancer                            screening in 10 years with colonoscopy without need                            for colon cancer screening by any method prior to                            then (including stool testing). Milus Banister, MD 12/01/2017 9:12:34 AM This report has been signed electronically.

## 2017-12-01 NOTE — Progress Notes (Signed)
I have reviewed the patient's medical history in detail and updated the computerized patient record.

## 2017-12-02 ENCOUNTER — Telehealth: Payer: Self-pay

## 2017-12-02 NOTE — Telephone Encounter (Signed)
  Follow up Call-  Call back number 12/01/2017  Post procedure Call Back phone  # [phone]  Permission to leave phone message Yes  Some recent data might be hidden     Patient questions:  Do you have a fever, pain , or abdominal swelling? No. Pain Score  0 *  Have you tolerated food without any problems? Yes.    Have you been able to return to your normal activities? Yes.    Do you have any questions about your discharge instructions: Diet   No. Medications  No. Follow up visit  No.  Do you have questions or concerns about your Care? No.  Actions: * If pain score is 4 or above: No action needed, pain <4.

## 2017-12-03 ENCOUNTER — Ambulatory Visit
Admission: RE | Admit: 2017-12-03 | Discharge: 2017-12-03 | Disposition: A | Discharge status: Home / Self Care | Payer: Medicare Other | Source: Ambulatory Visit | Attending: Internal Medicine | Admitting: Internal Medicine

## 2017-12-03 DIAGNOSIS — Z1231 Encounter for screening mammogram for malignant neoplasm of breast: Secondary | ICD-10-CM

## 2017-12-07 ENCOUNTER — Encounter: Payer: Self-pay | Admitting: Gastroenterology

## 2017-12-22 ENCOUNTER — Ambulatory Visit: Payer: Self-pay | Admitting: Internal Medicine

## 2018-01-05 ENCOUNTER — Encounter: Payer: Self-pay | Admitting: Internal Medicine

## 2018-01-05 ENCOUNTER — Ambulatory Visit: Payer: Medicare Other | Admitting: Internal Medicine

## 2018-01-05 VITALS — BP 132/80 | Temp 97.6°F | Ht 65.0 in | Wt 195.2 lb

## 2018-01-05 DIAGNOSIS — I10 Essential (primary) hypertension: Secondary | ICD-10-CM | POA: Diagnosis not present

## 2018-01-05 DIAGNOSIS — R7303 Prediabetes: Secondary | ICD-10-CM

## 2018-01-05 DIAGNOSIS — E782 Mixed hyperlipidemia: Secondary | ICD-10-CM

## 2018-01-05 DIAGNOSIS — E559 Vitamin D deficiency, unspecified: Secondary | ICD-10-CM

## 2018-01-05 DIAGNOSIS — R7309 Other abnormal glucose: Secondary | ICD-10-CM | POA: Diagnosis not present

## 2018-01-05 DIAGNOSIS — Z79899 Other long term (current) drug therapy: Secondary | ICD-10-CM | POA: Diagnosis not present

## 2018-01-05 DIAGNOSIS — M1990 Unspecified osteoarthritis, unspecified site: Secondary | ICD-10-CM

## 2018-01-05 MED ORDER — MELOXICAM 15 MG PO TABS: ORAL_TABLET | ORAL | 3 refills | Status: DC

## 2018-01-05 NOTE — Patient Instructions (Signed)

## 2018-01-05 NOTE — Progress Notes (Addendum)
This very nice 62 y.o. MWF presents for 6 month follow up with HTN, HLD, Pre-Diabetes and Vitamin D Deficiency. Also , patient is on CPAP for OSA with improved restorative sleep since 2004. She has been on SS Disability for Panic/Anxiety attacks & Depression since 2007 (age 23).      Patient is treated for HTN (2004) & BP has been controlled at home. Today's BP is at goal - 132/80. Patient has had no complaints of any cardiac type chest pain, palpitations, dyspnea / orthopnea / PND, dizziness, claudication, or dependent edema.     Hyperlipidemia is controlled with diet & meds. Patient denies myalgias or other med SE's. Last Lipids were  Lab Results  Component Value Date   CHOL 155 09/15/2017   HDL 48 (L) 09/15/2017   LDLCALC 85 09/15/2017   TRIG 123 09/15/2017   CHOLHDL 3.2 09/15/2017      Also, the patient has history of  Morbid Obesity (BMI 32+) and PreDiabetes (A1c 5.7% / 2010) and has had no symptoms of reactive hypoglycemia, diabetic polys, paresthesias or visual blurring.  Last A1c was not at goal: Lab Results  Component Value Date   HGBA1C 5.8 (H) 09/15/2017      Further, the patient also has history of Vitamin D Deficiency ("23"/2008)  and supplements vitamin D without any suspected side-effects. Last vitamin D was at goal: Lab Results  Component Value Date   VD25OH 69 05/28/2017   Current Outpatient Medications on File Prior to Visit  Medication Sig  . acetaminophen (TYLENOL) 500 MG tablet Take 500-1,000 mg by mouth every 6 (six) hours as needed for pain.  Marland Kitchen acyclovir (ZOVIRAX) 400 MG tablet Take 1 tablet (400 mg total) by mouth 3 (three) times daily.  Marland Kitchen aspirin 81 MG tablet Take 81 mg by mouth daily.  . benzonatate (TESSALON) 200 MG capsule Take 1 capsule (200 mg total) by mouth 3 (three) times daily as needed for cough (Max: 600mg  per day).  . Cetirizine HCl 10 MG CAPS Take 1 capsule by mouth daily.  . Cholecalciferol (VITAMIN D PO) Take 8,000 Int'l Units by mouth  daily.   . clonazePAM (KLONOPIN) 1 MG tablet Take 1 mg by mouth at bedtime.  . fluticasone (FLONASE) 50 MCG/ACT nasal spray Place 2 sprays into both nostrils daily.  Marland Kitchen glucose blood (ACCU-CHEK AVIVA PLUS) test strip CHECK BLOOD SUGAR 1 TIME DAILY. DX-R73.03  . hydrochlorothiazide (HYDRODIURIL) 25 MG tablet TAKE 1 TABLET BY MOUTH EVERY MORNING FOR BLOOD PRESSURE AND FLUID RETENTION  . Ibuprofen 200 MG CAPS Take 1 capsule by mouth. PRN  . Lancets (ACCU-CHEK MULTICLIX) lancets Use to check blood glucose daily  . Lysine 500 MG TABS Take by mouth daily. Currently taking 3 pill QD  . Magnesium 250 MG TABS Take 400 mg by mouth. Takes 3 pills per day  . meloxicam (MOBIC) 15 MG tablet Take 1 tablet (15 mg total) by mouth daily.  . Multiple Vitamin (MULTIVITAMIN WITH MINERALS) TABS tablet Take 0.5 tablets by mouth 2 (two) times daily.  Marland Kitchen OVER THE COUNTER MEDICATION 2 tablets daily. OsteoBioFlex  . QUEtiapine (SEROQUEL) 100 MG tablet Take 100 mg by mouth 3 (three) times daily.  . rosuvastatin (CRESTOR) 40 MG tablet TAKE 1 TABLET BY MOUTH DAILY (Patient taking differently: TAKE 1/2 TABLET BY MOUTH ON MON, WED, & FRIDAYS)  . lamoTRIgine (LAMICTAL) 150 MG tablet Take 1 & 1/2 tablets at bedtime   Current Facility-Administered Medications on File Prior to Visit  Medication  . 0.9 %  sodium chloride infusion   Allergies  Allergen Reactions  . Latex     Band aids= if left on for extended period of time  . Celecoxib     REACTION: Rash (celebrex)  . Cephalexin     REACTION: Rash (keflex)  . Codeine     REACTION: Rash  . Erythromycin     REACTION: Rash (emycin)  . Sulfonamide Derivatives     REACTION: Rash  . Trazodone And Nefazodone     insomnia  . Venlafaxine     REACTION: Blurred Vision Insurance claims handler)   PMHx:   Past Medical History:  Diagnosis Date  . Arthritis   . Depression   . Elevated hemoglobin A1c   . Fibromyalgia   . Hypertension   . Other abnormal glucose 08/09/2013  . Sleep apnea     cpap   > 4 yrs  last sleep study  . Unspecified essential hypertension 08/09/2013  . Unspecified vitamin D deficiency 08/09/2013  . Vitamin D deficiency    Immunization History  Administered Date(s) Administered  . DT 07/05/2015  . Influenza Inj Mdck Quad With Preservative 05/28/2017  . Influenza Split 04/06/2012, 04/05/2013, 05/16/2014, 03/21/2015  . Influenza,inj,quad, With Preservative 04/29/2016  . Pneumococcal Conjugate-13 05/16/2014  . Pneumococcal-Unspecified 06/22/1998  . Td 06/22/2004  . Zoster 10/21/2015   Past Surgical History:  Procedure Laterality Date  . ABDOMINAL HYSTERECTOMY  2002   Dr. Brien Mates   . COLONOSCOPY  2009  . JOINT REPLACEMENT     arthroscopy  . TOTAL KNEE ARTHROPLASTY Right 04/14/2013   Procedure: RIGHT TOTAL KNEE ARTHROPLASTY AND LEFT KNEE INJECTION ;  Surgeon: Alta Corning, MD;  Location: [facility];  Service: Orthopedics;  Laterality: Right;   FHx:    Reviewed / unchanged  SHx:    Reviewed / unchanged   Systems Review:  Constitutional: Denies fever, chills, wt changes, headaches, insomnia, fatigue, night sweats, change in appetite. Eyes: Denies redness, blurred vision, diplopia, discharge, itchy, watery eyes.  ENT: Denies discharge, congestion, post nasal drip, epistaxis, sore throat, earache, hearing loss, dental pain, tinnitus, vertigo, sinus pain, snoring.  CV: Denies chest pain, palpitations, irregular heartbeat, syncope, dyspnea, diaphoresis, orthopnea, PND, claudication or edema. Respiratory: denies cough, dyspnea, DOE, pleurisy, hoarseness, laryngitis, wheezing.  Gastrointestinal: Denies dysphagia, odynophagia, heartburn, reflux, water brash, abdominal pain or cramps, nausea, vomiting, bloating, diarrhea, constipation, hematemesis, melena, hematochezia  or hemorrhoids. Genitourinary: Denies dysuria, frequency, urgency, nocturia, hesitancy, discharge, hematuria or flank pain. Musculoskeletal: Denies arthralgias, myalgias, stiffness, jt. swelling,  pain, limping or strain/sprain.  Skin: Denies pruritus, rash, hives, warts, acne, eczema or change in skin lesion(s). Neuro: No weakness, tremor, incoordination, spasms, paresthesia or pain. Psychiatric: Denies confusion, memory loss or sensory loss. Endo: Denies change in weight, skin or hair change.  Heme/Lymph: No excessive bleeding, bruising or enlarged lymph nodes.  Physical Exam  BP 132/80   Temp 97.6 F (36.4 C)   Ht 5\' 5"  (1.651 m)   Wt 195 lb 3.2 oz (88.5 kg)   SpO2 98%   BMI 32.48 kg/m   Appears  well nourished, well groomed  and in no distress.  Eyes: PERRLA, EOMs, conjunctiva no swelling or erythema. Sinuses: No frontal/maxillary tenderness ENT/Mouth: EAC's clear, TM's nl w/o erythema, bulging. Nares clear w/o erythema, swelling, exudates. Oropharynx clear without erythema or exudates. Oral hygiene is good. Tongue normal, non obstructing. Hearing intact.  Neck: Supple. Thyroid not palpable. Car 2+/2+ without bruits, nodes or JVD. Chest: Respirations nl with  BS clear & equal w/o rales, rhonchi, wheezing or stridor.  Cor: Heart sounds normal w/ regular rate and rhythm without sig. murmurs, gallops, clicks or rubs. Peripheral pulses normal and equal  without edema.  Abdomen: Soft & bowel sounds normal. Non-tender w/o guarding, rebound, hernias, masses or organomegaly.  Lymphatics: Unremarkable.  Musculoskeletal: Full ROM all peripheral extremities, joint stability, 5/5 strength and normal gait.  Skin: Warm, dry without exposed rashes, lesions or ecchymosis apparent.  Neuro: Cranial nerves intact, reflexes equal bilaterally. Sensory-motor testing grossly intact. Tendon reflexes grossly intact.  Pysch: Alert & oriented x 3.  Insight and judgement nl & appropriate. No ideations.  Assessment and Plan:  1. Essential hypertension  - Continue medication, monitor blood pressure at home.  - Continue DASH diet.  Reminder to go to the ER if any CP,  SOB, nausea, dizziness, severe  HA, changes vision/speech.  - CBC with Differential/Platelet - Magnesium - TSH  2. Hyperlipemia, mixed  - Continue diet/meds, exercise,& lifestyle modifications.  - Continue monitor periodic cholesterol/liver & renal functions   - Lipid panel - TSH  3. Abnormal glucose  - Continue diet, exercise, lifestyle modifications.  - Monitor appropriate labs.  - Hemoglobin A1c - Insulin, random  4. Vitamin D deficiency  - Continue supplementation.   - VITAMIN D 25 Hydroxyl  5. Prediabetes  - Hemoglobin A1c - Insulin, random  6. Medication management  - CBC with Differential/Platelet - COMPLETE METABOLIC PANEL WITH GFR - Magnesium - Lipid panel - TSH - Hemoglobin A1c - Insulin, random - VITAMIN D 25 Hydroxyl      Discussed  regular exercise, BP monitoring, weight control to achieve/maintain BMI less than 25 and discussed med and SE's. Recommended labs to assess and monitor clinical status with further disposition pending results of labs. Over 30 minutes of exam, counseling, chart review was performed.

## 2018-01-06 LAB — COMPLETE METABOLIC PANEL WITH GFR
AG RATIO: 1.7 (calc) (ref 1.0–2.5)
ALT: 38 U/L — AB (ref 6–29)
AST: 29 U/L (ref 10–35)
Albumin: 4.5 g/dL (ref 3.6–5.1)
Alkaline phosphatase (APISO): 91 U/L (ref 33–130)
BILIRUBIN TOTAL: 0.5 mg/dL (ref 0.2–1.2)
BUN: 15 mg/dL (ref 7–25)
CO2: 24 mmol/L (ref 20–32)
Calcium: 9.9 mg/dL (ref 8.6–10.4)
Chloride: 104 mmol/L (ref 98–110)
Creat: 0.69 mg/dL (ref 0.50–0.99)
GFR, EST AFRICAN AMERICAN: 109 mL/min/{1.73_m2} (ref >=60)
GFR, EST NON AFRICAN AMERICAN: 94 mL/min/{1.73_m2} (ref >=60)
Globulin: 2.7 g/dL (calc) (ref 1.9–3.7)
Glucose, Bld: 100 mg/dL — ABNORMAL HIGH (ref 65–99)
POTASSIUM: 4.1 mmol/L (ref 3.5–5.3)
SODIUM: 139 mmol/L (ref 135–146)
Total Protein: 7.2 g/dL (ref 6.1–8.1)

## 2018-01-06 LAB — LIPID PANEL
CHOL/HDL RATIO: 3.4 (calc) (ref <5.0)
Cholesterol: 183 mg/dL (ref <200)
HDL: 54 mg/dL (ref >50)
LDL CHOLESTEROL (CALC): 105 mg/dL — AB
Non-HDL Cholesterol (Calc): 129 mg/dL (calc) (ref <130)
Triglycerides: 140 mg/dL (ref <150)

## 2018-01-06 LAB — CBC WITH DIFFERENTIAL/PLATELET
BASOS ABS: 29 {cells}/uL (ref 0–200)
Basophils Relative: 0.5 %
Eosinophils Absolute: 177 cells/uL (ref 15–500)
Eosinophils Relative: 3.1 %
HEMATOCRIT: 42.1 % (ref 35.0–45.0)
HEMOGLOBIN: 14 g/dL (ref 11.7–15.5)
Lymphs Abs: 2103 cells/uL (ref 850–3900)
MCH: 30.1 pg (ref 27.0–33.0)
MCHC: 33.3 g/dL (ref 32.0–36.0)
MCV: 90.5 fL (ref 80.0–100.0)
MPV: 10.9 fL (ref 7.5–12.5)
Monocytes Relative: 8.4 %
Neutro Abs: 2913 cells/uL (ref 1500–7800)
Neutrophils Relative %: 51.1 %
Platelets: 320 10*3/uL (ref 140–400)
RBC: 4.65 10*6/uL (ref 3.80–5.10)
RDW: 12.9 % (ref 11.0–15.0)
Total Lymphocyte: 36.9 %
WBC: 5.7 10*3/uL (ref 3.8–10.8)
WBCMIX: 479 {cells}/uL (ref 200–950)

## 2018-01-06 LAB — HEMOGLOBIN A1C
Hgb A1c MFr Bld: 5.9 % of total Hgb — ABNORMAL HIGH (ref <5.7)
Mean Plasma Glucose: 123 (calc)
eAG (mmol/L): 6.8 (calc)

## 2018-01-06 LAB — VITAMIN D 25 HYDROXY (VIT D DEFICIENCY, FRACTURES): VIT D 25 HYDROXY: 64 ng/mL (ref 30–100)

## 2018-01-06 LAB — MAGNESIUM: Magnesium: 2.1 mg/dL (ref 1.5–2.5)

## 2018-01-06 LAB — INSULIN, RANDOM: INSULIN: 28.1 u[IU]/mL — AB (ref 2.0–19.6)

## 2018-01-06 LAB — TSH: TSH: 1.78 m[IU]/L (ref 0.40–4.50)

## 2018-01-07 ENCOUNTER — Telehealth: Payer: Self-pay | Admitting: Internal Medicine

## 2018-01-07 NOTE — Telephone Encounter (Signed)
Patient requested, Please order new CPAP Machine, mine [employer] for [employer]. Per Dr Melford Aase, faxed order for new replacement Auto CPAP 5-20cm machine, supplies, heated humidifier, and F2F. [name], [place] [phone]. ph [phone].

## 2018-01-26 ENCOUNTER — Other Ambulatory Visit: Payer: Self-pay | Admitting: Internal Medicine

## 2018-03-14 ENCOUNTER — Other Ambulatory Visit: Payer: Self-pay | Admitting: Physician Assistant

## 2018-04-12 NOTE — Progress Notes (Signed)
FOLLOW UP  Assessment and Plan:   Hypertension Well controlled with current medications  Monitor blood pressure at home; patient to call if consistently greater than 130/80 Continue DASH diet.   Reminder to go to the ER if any CP, SOB, nausea, dizziness, severe HA, changes vision/speech, left arm numbness and tingling and jaw pain.  Cholesterol Currently with mild elevations only; treated by lifestyle  Continue low cholesterol diet and exercise.  Check lipid panel.   Prediabetes Continue diet and exercise.  Perform daily foot/skin check, notify office of any concerning changes.  Check A1C  Obesity with co morbidities Long discussion about weight loss, diet, and exercise Recommended diet heavy in fruits and veggies and low in animal meats, cheeses, and dairy products, appropriate calorie intake Discussed ideal weight for height and initial weight goal (<190lb) Patient will work on cutting down on cutting down on sweets in home, making good choices when eating out Will follow up in 3 months  Vitamin D Def At goal at last visit; continue supplementation to maintain goal of 70-100 Defer Vit D level  Continue diet and meds as discussed. Further disposition pending results of labs. Discussed med's effects and SE's.   Over 30 minutes of exam, counseling, chart review, and critical decision making was performed.   Future Appointments  Date Time Provider [place]  06/24/2018 11:00 AM Unk Pinto, MD [facility] None    ----------------------------------------------------------------------------------------------------------------------  HPI 62 y.o. female  presents for 3 month follow up on hypertension, cholesterol, prediabetes, obesity and vitamin D deficiency. She is recovering from mild URI over the past week with dry cough and mild congestion. Taking OTC meds and benzonatate and doing well. No dyspnea, fever.   Patient has been on SS Disability since age 84 (2007)  for Panic, Anxiety & Depression, and also has fibromyalgia. She is followed by PA Gareth Eagle for mental health management.  BMI is Body mass index is 32.95 kg/m., she has not been working on diet and exercise; she admits she eats out too much. Exercise is limited due to knee pain, seeing Dr. Berenice Primas and overdue for shots, needs surgery but putting it off for now.  Wt Readings from Last 3 Encounters:  04/13/18 198 lb (89.8 kg)  01/05/18 195 lb 3.2 oz (88.5 kg)  12/01/17 195 lb (88.5 kg)   Her blood pressure has been controlled at home, today their BP is BP: 130/82  She does not workout. She denies chest pain, shortness of breath, dizziness.   She is on cholesterol medication, rosuvastatin 20 mg MWF and denies myalgias. Her cholesterol is not at goal. The cholesterol last visit was:   Lab Results  Component Value Date   CHOL 183 01/05/2018   HDL 54 01/05/2018   LDLCALC 105 (H) 01/05/2018   TRIG 140 01/05/2018   CHOLHDL 3.4 01/05/2018    She has not been working on diet and exercise for prediabetes, and denies increased appetite, nausea, paresthesia of the feet, polydipsia, polyuria, visual disturbances and vomiting. Last A1C in the office was:  Lab Results  Component Value Date   HGBA1C 5.9 (H) 01/05/2018   Patient is on Vitamin D supplement.   Lab Results  Component Value Date   VD25OH 64 01/05/2018        Current Medications:  Current Outpatient Medications on File Prior to Visit  Medication Sig  . acetaminophen (TYLENOL) 500 MG tablet Take 500-1,000 mg by mouth every 6 (six) hours as needed for pain.  Marland Kitchen acyclovir (ZOVIRAX)  400 MG tablet Take 1 tablet (400 mg total) by mouth 3 (three) times daily.  Marland Kitchen aspirin 81 MG tablet Take 81 mg by mouth daily.  . benzonatate (TESSALON) 200 MG capsule Take 1 capsule (200 mg total) by mouth 3 (three) times daily as needed for cough (Max: 600mg  per day).  . Cetirizine HCl 10 MG CAPS Take 1 capsule by mouth daily.  . Cholecalciferol (VITAMIN  D PO) Take 8,000 Int'l Units by mouth daily.   . clonazePAM (KLONOPIN) 1 MG tablet Take 1 mg by mouth at bedtime.  . fluticasone (FLONASE) 50 MCG/ACT nasal spray Use 1 to 2 sprays each nostril 1 to 2 x /day  . glucose blood (ACCU-CHEK AVIVA PLUS) test strip CHECK BLOOD SUGAR 1 TIME DAILY. DX-R73.03  . hydrochlorothiazide (HYDRODIURIL) 25 MG tablet TAKE 1 TABLET BY MOUTH EVERY MORNING FOR BLOOD PRESSURE AND FLUID RETENTION  . Ibuprofen 200 MG CAPS Take 1 capsule by mouth. PRN  . Lancets (ACCU-CHEK MULTICLIX) lancets Use to check blood glucose daily  . Lysine 500 MG TABS Take by mouth daily. Currently taking 3 pill QD  . Magnesium 250 MG TABS Take 400 mg by mouth. Takes 3 pills per day  . meloxicam (MOBIC) 15 MG tablet Take 1/2 to 1 tablet daily with food for Pain & Inflammation  . Multiple Vitamin (MULTIVITAMIN WITH MINERALS) TABS tablet Take 0.5 tablets by mouth 2 (two) times daily.  Marland Kitchen OVER THE COUNTER MEDICATION 2 tablets daily. OsteoBioFlex  . QUEtiapine (SEROQUEL) 100 MG tablet Take 100 mg by mouth 3 (three) times daily.  . rosuvastatin (CRESTOR) 40 MG tablet TAKE 1 TABLET BY MOUTH DAILY (Patient taking differently: TAKE 1/2 TABLET BY MOUTH ON MON, WED, & FRIDAYS)  . lamoTRIgine (LAMICTAL) 150 MG tablet Take 1 & 1/2 tablets at bedtime   Current Facility-Administered Medications on File Prior to Visit  Medication  . 0.9 %  sodium chloride infusion     Allergies:  Allergies  Allergen Reactions  . Latex     Band aids= if left on for extended period of time  . Celecoxib     REACTION: Rash (celebrex)  . Cephalexin     REACTION: Rash (keflex)  . Codeine     REACTION: Rash  . Erythromycin     REACTION: Rash (emycin)  . Sulfonamide Derivatives     REACTION: Rash  . Trazodone And Nefazodone     insomnia  . Venlafaxine     REACTION: Blurred Vision Insurance claims handler)     Medical History:  Past Medical History:  Diagnosis Date  . Arthritis   . Depression   . Elevated hemoglobin A1c   .  Fibromyalgia   . Hypertension   . Other abnormal glucose 08/09/2013  . Sleep apnea    cpap   > 4 yrs  last sleep study  . Unspecified essential hypertension 08/09/2013  . Unspecified vitamin D deficiency 08/09/2013  . Vitamin D deficiency    Family history- Reviewed and unchanged Social history- Reviewed and unchanged   Review of Systems:  Review of Systems  Constitutional: Negative for malaise/fatigue and weight loss.  HENT: Negative for hearing loss, sore throat and tinnitus.   Eyes: Negative for blurred vision and double vision.  Respiratory: Positive for cough. Negative for sputum production, shortness of breath and wheezing.   Cardiovascular: Negative for chest pain, palpitations, orthopnea, claudication and leg swelling.  Gastrointestinal: Negative for abdominal pain, blood in stool, constipation, diarrhea, heartburn, melena, nausea and vomiting.  Genitourinary: Negative.  Musculoskeletal: Positive for joint pain (left knee). Negative for myalgias.  Skin: Negative for rash.  Neurological: Negative for dizziness, tingling, sensory change, weakness and headaches.  Endo/Heme/Allergies: Negative for environmental allergies and polydipsia.  All other systems reviewed and are negative.    Physical Exam: BP 130/82   Pulse 88   Temp (!) 96.4 F (35.8 C)   Ht 5\' 5"  (1.651 m)   Wt 198 lb (89.8 kg)   SpO2 96%   BMI 32.95 kg/m  Wt Readings from Last 3 Encounters:  04/13/18 198 lb (89.8 kg)  01/05/18 195 lb 3.2 oz (88.5 kg)  12/01/17 195 lb (88.5 kg)   General Appearance: Well nourished, in no apparent distress. Eyes: PERRLA, EOMs, conjunctiva no swelling or erythema Sinuses: No Frontal/maxillary tenderness ENT/Mouth: Ext aud canals clear, TMs without erythema, bulging. No erythema, swelling, or exudate on post pharynx.  Tonsils not swollen or erythematous. Hearing normal.  Neck: Supple, thyroid normal.  Respiratory: Respiratory effort normal, BS equal bilaterally without  rales, rhonchi, wheezing or stridor.  Cardio: RRR with no MRGs. Brisk peripheral pulses without edema.  Abdomen: Soft, + BS.  Non tender, no guarding, rebound, hernias, masses. Lymphatics: Non tender without lymphadenopathy.  Musculoskeletal: Full ROM, 5/5 strength, Antalgic gait Skin: Warm, dry without rashes, lesions, ecchymosis.  Neuro: Cranial nerves intact. No cerebellar symptoms.  Psych: Awake and oriented X 3, normal affect, Insight and Judgment appropriate.   Izora Ribas, NP 11:16 AM [facility]

## 2018-04-13 ENCOUNTER — Encounter: Payer: Self-pay | Admitting: Adult Health

## 2018-04-13 ENCOUNTER — Ambulatory Visit: Payer: Medicare Other | Admitting: Adult Health

## 2018-04-13 VITALS — BP 130/82 | HR 88 | Temp 96.4°F | Ht 65.0 in | Wt 198.0 lb

## 2018-04-13 DIAGNOSIS — I10 Essential (primary) hypertension: Secondary | ICD-10-CM

## 2018-04-13 DIAGNOSIS — R7303 Prediabetes: Secondary | ICD-10-CM | POA: Diagnosis not present

## 2018-04-13 DIAGNOSIS — E669 Obesity, unspecified: Secondary | ICD-10-CM | POA: Diagnosis not present

## 2018-04-13 DIAGNOSIS — E559 Vitamin D deficiency, unspecified: Secondary | ICD-10-CM

## 2018-04-13 DIAGNOSIS — Z23 Encounter for immunization: Secondary | ICD-10-CM | POA: Diagnosis not present

## 2018-04-13 DIAGNOSIS — Z79899 Other long term (current) drug therapy: Secondary | ICD-10-CM

## 2018-04-13 DIAGNOSIS — F3341 Major depressive disorder, recurrent, in partial remission: Secondary | ICD-10-CM

## 2018-04-13 DIAGNOSIS — E782 Mixed hyperlipidemia: Secondary | ICD-10-CM

## 2018-04-13 NOTE — Patient Instructions (Signed)
Goals    . HEMOGLOBIN A1C < 5.7    . Weight (lb) < 190 lb (86.2 kg)        Aim for 7+ servings of fruits and vegetables daily  65-80+ fluid ounces of water or unsweet tea for healthy kidneys  Limit to max 1 drink of alcohol per day; avoid smoking/tobacco  Limit animal fats in diet for cholesterol and heart health - choose grass fed whenever available  Avoid highly processed foods, and foods high in saturated/trans fats  Aim for low stress - take time to unwind and care for your mental health  Aim for 150 min of moderate intensity exercise weekly for heart health, and weights twice weekly for bone health  Aim for 7-9 hours of sleep daily   Dining out: help on how to choose  It is better if you can meal plan and eat at your house but sometimes life happens so here are some tips to help you make healthier choices while eating out:  1) Ask for your server to pack up 1/2 of your meal to take home for lunch or later. Portion sizes are huge so if you don't have all of it in front of you, you are less likely to eat it all.    2) Ask if they have a smaller portion or lunch portion available, this is often cheaper as well!  3) Ask to not have the bread basket brought to the table  4) Ask for the dressing on the side.   5) Look at the nutrition menu BEFORE you go to a restaurant or fast food chain and have what you will eat in mind. You can also look it up on food tracking ups like Myfitness Pal or Lost it. However the web site for the restaurant is usually the best bet.   6) Don't forget that you are the costumer, it is okay to ask them to leave things out, ask about substituting, or ask them to cook with less oil!  Here is some more general information for you!

## 2018-04-13 NOTE — Addendum Note (Signed)
Addended by: Chancy Hurter on: 04/13/2018 11:37 AM   Modules accepted: Orders

## 2018-04-14 LAB — LIPID PANEL
CHOLESTEROL: 144 mg/dL (ref <200)
HDL: 49 mg/dL — AB (ref >50)
LDL Cholesterol (Calc): 75 mg/dL (calc)
NON-HDL CHOLESTEROL (CALC): 95 mg/dL (ref <130)
Total CHOL/HDL Ratio: 2.9 (calc) (ref <5.0)
Triglycerides: 118 mg/dL (ref <150)

## 2018-04-14 LAB — COMPLETE METABOLIC PANEL WITH GFR
AG RATIO: 1.5 (calc) (ref 1.0–2.5)
ALKALINE PHOSPHATASE (APISO): 91 U/L (ref 33–130)
ALT: 35 U/L — ABNORMAL HIGH (ref 6–29)
AST: 24 U/L (ref 10–35)
Albumin: 4.3 g/dL (ref 3.6–5.1)
BILIRUBIN TOTAL: 0.6 mg/dL (ref 0.2–1.2)
BUN: 15 mg/dL (ref 7–25)
CHLORIDE: 104 mmol/L (ref 98–110)
CO2: 29 mmol/L (ref 20–32)
Calcium: 9.8 mg/dL (ref 8.6–10.4)
Creat: 0.67 mg/dL (ref 0.50–0.99)
GFR, EST AFRICAN AMERICAN: 110 mL/min/{1.73_m2} (ref >=60)
GFR, Est Non African American: 95 mL/min/{1.73_m2} (ref >=60)
GLUCOSE: 87 mg/dL (ref 65–99)
Globulin: 2.8 g/dL (calc) (ref 1.9–3.7)
POTASSIUM: 4.2 mmol/L (ref 3.5–5.3)
Sodium: 140 mmol/L (ref 135–146)
Total Protein: 7.1 g/dL (ref 6.1–8.1)

## 2018-04-14 LAB — CBC WITH DIFFERENTIAL/PLATELET
BASOS PCT: 0.7 %
Basophils Absolute: 41 cells/uL (ref 0–200)
EOS PCT: 3.6 %
Eosinophils Absolute: 212 cells/uL (ref 15–500)
HEMATOCRIT: 40.5 % (ref 35.0–45.0)
HEMOGLOBIN: 13.8 g/dL (ref 11.7–15.5)
LYMPHS ABS: 1971 {cells}/uL (ref 850–3900)
MCH: 30.6 pg (ref 27.0–33.0)
MCHC: 34.1 g/dL (ref 32.0–36.0)
MCV: 89.8 fL (ref 80.0–100.0)
MPV: 10.8 fL (ref 7.5–12.5)
Monocytes Relative: 8 %
NEUTROS ABS: 3204 {cells}/uL (ref 1500–7800)
Neutrophils Relative %: 54.3 %
Platelets: 313 10*3/uL (ref 140–400)
RBC: 4.51 10*6/uL (ref 3.80–5.10)
RDW: 12.5 % (ref 11.0–15.0)
Total Lymphocyte: 33.4 %
WBC mixed population: 472 cells/uL (ref 200–950)
WBC: 5.9 10*3/uL (ref 3.8–10.8)

## 2018-04-14 LAB — HEMOGLOBIN A1C
EAG (MMOL/L): 7 (calc)
Hgb A1c MFr Bld: 6 % of total Hgb — ABNORMAL HIGH (ref <5.7)
MEAN PLASMA GLUCOSE: 126 (calc)

## 2018-04-14 LAB — TSH: TSH: 1.23 mIU/L (ref 0.40–4.50)

## 2018-04-14 LAB — MAGNESIUM: MAGNESIUM: 2 mg/dL (ref 1.5–2.5)

## 2018-05-01 ENCOUNTER — Other Ambulatory Visit: Payer: Self-pay | Admitting: Internal Medicine

## 2018-06-09 ENCOUNTER — Encounter: Payer: Self-pay | Admitting: Internal Medicine

## 2018-06-24 ENCOUNTER — Encounter: Payer: Self-pay | Admitting: Internal Medicine

## 2018-07-27 ENCOUNTER — Encounter: Payer: Self-pay | Admitting: Internal Medicine

## 2018-07-27 ENCOUNTER — Ambulatory Visit: Payer: Medicare Other | Admitting: Internal Medicine

## 2018-07-27 VITALS — BP 142/98 | HR 93 | Temp 97.7°F | Ht 65.0 in | Wt 197.8 lb

## 2018-07-27 DIAGNOSIS — I10 Essential (primary) hypertension: Secondary | ICD-10-CM | POA: Diagnosis not present

## 2018-07-27 DIAGNOSIS — Z9989 Dependence on other enabling machines and devices: Secondary | ICD-10-CM

## 2018-07-27 DIAGNOSIS — E559 Vitamin D deficiency, unspecified: Secondary | ICD-10-CM

## 2018-07-27 DIAGNOSIS — Z8249 Family history of ischemic heart disease and other diseases of the circulatory system: Secondary | ICD-10-CM

## 2018-07-27 DIAGNOSIS — Z79899 Other long term (current) drug therapy: Secondary | ICD-10-CM

## 2018-07-27 DIAGNOSIS — Z0001 Encounter for general adult medical examination with abnormal findings: Secondary | ICD-10-CM

## 2018-07-27 DIAGNOSIS — M797 Fibromyalgia: Secondary | ICD-10-CM

## 2018-07-27 DIAGNOSIS — Z136 Encounter for screening for cardiovascular disorders: Secondary | ICD-10-CM | POA: Diagnosis not present

## 2018-07-27 DIAGNOSIS — E782 Mixed hyperlipidemia: Secondary | ICD-10-CM

## 2018-07-27 DIAGNOSIS — Z0000 Encounter for general adult medical examination without abnormal findings: Secondary | ICD-10-CM | POA: Diagnosis not present

## 2018-07-27 DIAGNOSIS — Z1211 Encounter for screening for malignant neoplasm of colon: Secondary | ICD-10-CM

## 2018-07-27 DIAGNOSIS — F329 Major depressive disorder, single episode, unspecified: Secondary | ICD-10-CM

## 2018-07-27 DIAGNOSIS — R7309 Other abnormal glucose: Secondary | ICD-10-CM

## 2018-07-27 DIAGNOSIS — G4733 Obstructive sleep apnea (adult) (pediatric): Secondary | ICD-10-CM

## 2018-07-27 DIAGNOSIS — F32A Depression, unspecified: Secondary | ICD-10-CM

## 2018-07-27 DIAGNOSIS — R7303 Prediabetes: Secondary | ICD-10-CM

## 2018-07-27 DIAGNOSIS — Z1212 Encounter for screening for malignant neoplasm of rectum: Secondary | ICD-10-CM

## 2018-07-27 MED ORDER — ATENOLOL 50 MG PO TABS: ORAL_TABLET | ORAL | 3 refills | Status: DC

## 2018-07-27 NOTE — Patient Instructions (Signed)

## 2018-07-27 NOTE — Progress Notes (Signed)
[facility] Unk Pinto, M.D.     [name]. Silverio Lay, P.A.-C Liane Comber, [place] [address] [place], [place] [address] Telephone [phone] Telefax [phone] Annual Screening/Preventative Visit & Comprehensive Evaluation &  Examination     This very nice 63 y.o. MWF  presents for a Screening /Preventative Visit & comprehensive evaluation and management of multiple medical co-morbidities.  Patient has been followed for HTN, HLD, Prediabetes  and Vitamin D Deficiency. Patient has OSA & is on CPAP predating since 2004 reporting improved restorative sleep and no more daytime excessive sleepiness.      Patient has been on SS Disability since age 60 for hx/o Panic / Anxiety & Depression.       HTN predates circa 2004. Patient's BP has been controlled at home and patient denies any cardiac symptoms as chest pain, palpitations, shortness of breath, dizziness or ankle swelling. Today's BP was initially elevated at 142/98 and rechecked at 120/80.       Patient's hyperlipidemia is controlled with diet and medications. Patient denies myalgias or other medication SE's. Last lipids were at goal: Lab Results  Component Value Date   CHOL 144 04/13/2018   HDL 49 (L) 04/13/2018   LDLCALC 75 04/13/2018   TRIG 118 04/13/2018   CHOLHDL 2.9 04/13/2018      Patient has Morbid Obesity (BMI 33) and hx/o prediabetes (A1c 5.7% / 2010)  and patient denies reactive hypoglycemic symptoms, visual blurring, diabetic polys or paresthesias. Last A1c was still not at goal: Lab Results  Component Value Date   HGBA1C 6.0 (H) 04/13/2018      Finally, patient has history of Vitamin D Deficiency  ("23" / 2008)  and last Vitamin D was at goal; Lab Results  Component Value Date   VD25OH 64 01/05/2018   Current Outpatient Medications on File Prior to Visit  Medication Sig  . acetaminophen (TYLENOL) 500 MG tablet Take 500-1,000 mg  by mouth every 6 (six) hours as needed for pain.  Marland Kitchen acyclovir (ZOVIRAX) 400 MG tablet Take 1 tablet (400 mg total) by mouth 3 (three) times daily.  Marland Kitchen aspirin 81 MG tablet Take 81 mg by mouth daily.  . benzonatate (TESSALON) 200 MG capsule Take 1 capsule (200 mg total) by mouth 3 (three) times daily as needed for cough (Max: 600mg  per day).  . Cetirizine HCl 10 MG CAPS Take 1 capsule by mouth daily.  . Cholecalciferol (VITAMIN D PO) Take 8,000 Int'l Units by mouth daily.   . clonazePAM (KLONOPIN) 1 MG tablet Take 1 mg by mouth at bedtime.  . fluticasone (FLONASE) 50 MCG/ACT nasal spray Use 1 to 2 sprays each nostril 1 to 2 x /day  . glucose blood (ACCU-CHEK AVIVA PLUS) test strip CHECK BLOOD SUGAR 1 TIME DAILY. DX-R73.03  . hydrochlorothiazide (HYDRODIURIL) 25 MG tablet TAKE 1 TABLET BY MOUTH EVERY MORNING FOR BLOOD PRESSURE AND FLUID RETENTION  . Ibuprofen 200 MG CAPS Take 1 capsule by mouth. PRN  . lamoTRIgine (LAMICTAL) 150 MG tablet Take 1 & 1/2 tablets at bedtime  . Lancets (ACCU-CHEK MULTICLIX) lancets Use to check blood glucose daily  . Lysine 500 MG TABS Take by mouth daily. Currently taking 3 pill QD  . Magnesium 250 MG TABS Take 400 mg by mouth. Takes 3 pills per day  . meloxicam (MOBIC) 15 MG tablet Take 1/2 to 1 tablet daily with food for Pain & Inflammation  . Multiple Vitamin (MULTIVITAMIN WITH MINERALS) TABS  tablet Take 0.5 tablets by mouth 2 (two) times daily.  Marland Kitchen OVER THE COUNTER MEDICATION 2 tablets daily. OsteoBioFlex  . QUEtiapine (SEROQUEL) 100 MG tablet Take 100 mg by mouth 3 (three) times daily.  . rosuvastatin (CRESTOR) 40 MG tablet TAKE 1 TABLET BY MOUTH DAILY (Patient taking differently: Take 1/2 tablet on M, W, F)   Current Facility-Administered Medications on File Prior to Visit  Medication  . 0.9 %  sodium chloride infusion   Allergies  Allergen Reactions  . Latex     Band aids= if left on for extended period of time  . Celecoxib     REACTION: Rash (celebrex)   . Cephalexin     REACTION: Rash (keflex)  . Codeine     REACTION: Rash  . Erythromycin     REACTION: Rash (emycin)  . Sulfonamide Derivatives     REACTION: Rash  . Trazodone And Nefazodone     insomnia  . 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 Arthritis   . Depression   . Elevated hemoglobin A1c   . Fibromyalgia   . Hypertension   . Other abnormal glucose 08/09/2013  . Sleep apnea    cpap   > 4 yrs  last sleep study  . Unspecified essential hypertension 08/09/2013  . Unspecified vitamin D deficiency 08/09/2013  . Vitamin D deficiency    Health Maintenance  Topic Date Due  . MAMMOGRAM  12/04/2019  . TETANUS/TDAP  07/04/2025  . COLONOSCOPY  12/02/2027  . INFLUENZA VACCINE  Completed  . Hepatitis C Screening  Completed  . HIV Screening  Completed  . PAP SMEAR-Modifier  Discontinued   Immunization History  Administered Date(s) Administered  . DT 07/05/2015  . Influenza Inj Mdck Quad With Preservative 05/28/2017, 04/13/2018  . Influenza Split 04/06/2012, 04/05/2013, 05/16/2014, 03/21/2015  . Influenza,inj,quad, With Preservative 04/29/2016  . Pneumococcal Conjugate-13 05/16/2014  . Pneumococcal-Unspecified 06/22/1998  . Td 06/22/2004  . Zoster 10/21/2015   Last Colon - 12/01/2017 - Dr Ardis Hughs recc 10 yr f/u due June 2029  Last MGM - 12/03/2017   Past Surgical History:  Procedure Laterality Date  . ABDOMINAL HYSTERECTOMY  2002   Dr. Brien Mates   . COLONOSCOPY  2009  . JOINT REPLACEMENT     arthroscopy  . TOTAL KNEE ARTHROPLASTY Right 04/14/2013   Procedure: RIGHT TOTAL KNEE ARTHROPLASTY AND LEFT KNEE INJECTION ;  Surgeon: Alta Corning, MD;  Location: [facility];  Service: Orthopedics;  Laterality: Right;   Family History  Problem Relation Age of Onset  . Hypertension Mother   . Hypertension Father   . COPD Father   . Cancer Father        thyroid  . CVA Maternal Grandmother   . Colon cancer Neg Hx   .  Esophageal cancer Neg Hx   . Stomach cancer Neg Hx    Social History   Tobacco Use  . Smoking status: Never Smoker  . Smokeless tobacco: Never Used  Substance Use Topics  . Alcohol use: No  . Drug use: No    ROS Constitutional: Denies fever, chills, weight loss/gain, headaches, insomnia,  night sweats, and change in appetite. Does c/o fatigue. Eyes: Denies redness, blurred vision, diplopia, discharge, itchy, watery eyes.  ENT: Denies discharge, congestion, post nasal drip, epistaxis, sore throat, earache, hearing loss, dental pain, Tinnitus, Vertigo, Sinus pain, snoring.  Cardio: Denies chest pain, palpitations, irregular heartbeat, syncope, dyspnea, diaphoresis, orthopnea,  PND, claudication, edema Respiratory: denies cough, dyspnea, DOE, pleurisy, hoarseness, laryngitis, wheezing.  Gastrointestinal: Denies dysphagia, heartburn, reflux, water brash, pain, cramps, nausea, vomiting, bloating, diarrhea, constipation, hematemesis, melena, hematochezia, jaundice, hemorrhoids Genitourinary: Denies dysuria, frequency, urgency, nocturia, hesitancy, discharge, hematuria, flank pain Breast: Breast lumps, nipple discharge, bleeding.  Musculoskeletal: Denies arthralgia, myalgia, stiffness, Jt. Swelling, pain, limp, and strain/sprain. Denies falls. Skin: Denies puritis, rash, hives, warts, acne, eczema, changing in skin lesion Neuro: No weakness, tremor, incoordination, spasms, paresthesia, pain Psychiatric: Denies confusion, memory loss, sensory loss. Denies Depression. Endocrine: Denies change in weight, skin, hair change, nocturia, and paresthesia, diabetic polys, visual blurring, hyper / hypo glycemic episodes.  Heme/Lymph: No excessive bleeding, bruising, enlarged lymph nodes.  Physical Exam  BP (!) 142/98   Pulse 93   Temp 97.7 F (36.5 C)   Ht 5\' 5"  (1.651 m)   Wt 197 lb 12.8 oz (89.7 kg)   SpO2 98%   BMI 32.92 kg/m   General Appearance: Well nourished, well groomed and in no  apparent distress.  Eyes: PERRLA, EOMs, conjunctiva no swelling or erythema, normal fundi and vessels. Sinuses: No frontal/maxillary tenderness ENT/Mouth: EACs patent / TMs  nl. Nares clear without erythema, swelling, mucoid exudates. Oral hygiene is good. No erythema, swelling, or exudate. Tongue normal, non-obstructing. Tonsils not swollen or erythematous. Hearing normal.  Neck: Supple, thyroid not palpable. No bruits, nodes or JVD. Respiratory: Respiratory effort normal.  BS equal and clear bilateral without rales, rhonci, wheezing or stridor. Cardio: Heart sounds are normal with regular rate and rhythm and no murmurs, rubs or gallops. Peripheral pulses are normal and equal bilaterally without edema. No aortic or femoral bruits. Chest: symmetric with normal excursions and percussion. Breasts: Symmetric, without lumps, nipple discharge, retractions, or fibrocystic changes.  Abdomen: Rotund, soft with bowel sounds active. Nontender, no guarding, rebound, hernias, masses, or organomegaly.  Lymphatics: Non tender without lymphadenopathy.  Musculoskeletal: Full ROM all peripheral extremities, joint stability, 5/5 strength, and normal gait. Skin: Warm and dry without rashes, lesions, cyanosis, clubbing or  ecchymosis.  Neuro: Cranial nerves intact, reflexes equal bilaterally. Normal muscle tone, no cerebellar symptoms. Sensation intact.  Pysch: Alert and oriented x 3, normal affect, Insight and Judgment appropriate.   Assessment and Plan  1. Annual Preventative Screening Examination  2. Essential hypertension  - EKG 12-Lead - Urinalysis, Routine w reflex microscopic - Microalbumin / creatinine urine ratio - CBC with Differential/Platelet - COMPLETE METABOLIC PANEL WITH GFR - Magnesium - TSH  3. Hyperlipemia, mixed  - EKG 12-Lead - Lipid panel - TSH  4. Abnormal glucose  - EKG 12-Lead - Hemoglobin A1c - Insulin, random  5. Vitamin D deficiency  - VITAMIN D 25 Hydroxyl  6.  Prediabetes  - EKG 12-Lead - Hemoglobin A1c - Insulin, random  7. Fibromyalgia   8. OSA on CPAP   9. Depression, controlled   10. Screening for colorectal cancer  - POC Hemoccult Bld/St  11. Screening for ischemic heart disease  - EKG 12-Lead  12. FH: hypertension  - EKG 12-Lead  13. Medication management  - Urinalysis, Routine w reflex microscopic - Microalbumin / creatinine urine ratio - CBC with Differential/Platelet - COMPLETE METABOLIC PANEL WITH GFR - Magnesium - Lipid panel - TSH - Hemoglobin A1c - Insulin, random - VITAMIN D 25 Hydroxyl       Patient was counseled in prudent diet to achieve/maintain BMI less than 25 for weight control, BP monitoring, regular exercise and medications. Discussed med's effects and SE's. Screening labs  and tests as requested with regular follow-up as recommended. Over 40 minutes of exam, counseling, chart review and high complex critical decision making was performed.

## 2018-07-28 LAB — CBC WITH DIFFERENTIAL/PLATELET
Absolute Monocytes: 428 cells/uL (ref 200–950)
BASOS ABS: 28 {cells}/uL (ref 0–200)
Basophils Relative: 0.6 %
EOS ABS: 129 {cells}/uL (ref 15–500)
Eosinophils Relative: 2.8 %
HCT: 43.4 % (ref 35.0–45.0)
HEMOGLOBIN: 14.5 g/dL (ref 11.7–15.5)
Lymphs Abs: 1684 cells/uL (ref 850–3900)
MCH: 30.6 pg (ref 27.0–33.0)
MCHC: 33.4 g/dL (ref 32.0–36.0)
MCV: 91.6 fL (ref 80.0–100.0)
MPV: 10.9 fL (ref 7.5–12.5)
Monocytes Relative: 9.3 %
NEUTROS ABS: 2332 {cells}/uL (ref 1500–7800)
NEUTROS PCT: 50.7 %
Platelets: 323 10*3/uL (ref 140–400)
RBC: 4.74 10*6/uL (ref 3.80–5.10)
RDW: 12.9 % (ref 11.0–15.0)
Total Lymphocyte: 36.6 %
WBC: 4.6 10*3/uL (ref 3.8–10.8)

## 2018-07-28 LAB — COMPLETE METABOLIC PANEL WITH GFR
AG RATIO: 1.6 (calc) (ref 1.0–2.5)
ALBUMIN MSPROF: 4.6 g/dL (ref 3.6–5.1)
ALKALINE PHOSPHATASE (APISO): 97 U/L (ref 37–153)
ALT: 42 U/L — ABNORMAL HIGH (ref 6–29)
AST: 29 U/L (ref 10–35)
BUN: 16 mg/dL (ref 7–25)
CHLORIDE: 103 mmol/L (ref 98–110)
CO2: 25 mmol/L (ref 20–32)
CREATININE: 0.69 mg/dL (ref 0.50–0.99)
Calcium: 10.1 mg/dL (ref 8.6–10.4)
GFR, Est African American: 109 mL/min/{1.73_m2} (ref >=60)
GFR, Est Non African American: 94 mL/min/{1.73_m2} (ref >=60)
GLOBULIN: 2.9 g/dL (ref 1.9–3.7)
GLUCOSE: 108 mg/dL — AB (ref 65–99)
Potassium: 3.8 mmol/L (ref 3.5–5.3)
SODIUM: 140 mmol/L (ref 135–146)
Total Bilirubin: 0.5 mg/dL (ref 0.2–1.2)
Total Protein: 7.5 g/dL (ref 6.1–8.1)

## 2018-07-28 LAB — URINALYSIS, ROUTINE W REFLEX MICROSCOPIC
Bilirubin Urine: NEGATIVE
Glucose, UA: NEGATIVE
HGB URINE DIPSTICK: NEGATIVE
Ketones, ur: NEGATIVE
Leukocytes, UA: NEGATIVE
NITRITE: NEGATIVE
PH: 7.5 (ref 5.0–8.0)
PROTEIN: NEGATIVE
Specific Gravity, Urine: 1.013 (ref 1.001–1.03)

## 2018-07-28 LAB — LIPID PANEL
Cholesterol: 162 mg/dL (ref <200)
HDL: 51 mg/dL (ref >=50)
LDL CHOLESTEROL (CALC): 91 mg/dL
Non-HDL Cholesterol (Calc): 111 mg/dL (calc) (ref <130)
Total CHOL/HDL Ratio: 3.2 (calc) (ref <5.0)
Triglycerides: 100 mg/dL (ref <150)

## 2018-07-28 LAB — MICROALBUMIN / CREATININE URINE RATIO: CREATININE, URINE: 44 mg/dL (ref 20–275)

## 2018-07-28 LAB — TSH: TSH: 1.55 mIU/L (ref 0.40–4.50)

## 2018-07-28 LAB — HEMOGLOBIN A1C
Hgb A1c MFr Bld: 5.8 % of total Hgb — ABNORMAL HIGH (ref <5.7)
Mean Plasma Glucose: 120 (calc)
eAG (mmol/L): 6.6 (calc)

## 2018-07-28 LAB — INSULIN, RANDOM: Insulin: 24.7 u[IU]/mL — ABNORMAL HIGH (ref 2.0–19.6)

## 2018-07-28 LAB — MAGNESIUM: MAGNESIUM: 2 mg/dL (ref 1.5–2.5)

## 2018-07-28 LAB — VITAMIN D 25 HYDROXY (VIT D DEFICIENCY, FRACTURES): Vit D, 25-Hydroxy: 72 ng/mL (ref 30–100)

## 2018-08-05 ENCOUNTER — Other Ambulatory Visit: Payer: Self-pay | Admitting: Internal Medicine

## 2018-08-30 ENCOUNTER — Other Ambulatory Visit: Payer: Self-pay

## 2018-08-30 DIAGNOSIS — Z1212 Encounter for screening for malignant neoplasm of rectum: Principal | ICD-10-CM

## 2018-08-30 DIAGNOSIS — Z1211 Encounter for screening for malignant neoplasm of colon: Secondary | ICD-10-CM

## 2018-08-30 LAB — POC HEMOCCULT BLD/STL (HOME/3-CARD/SCREEN)
Card #2 Fecal Occult Blod, POC: NEGATIVE
Card #3 Fecal Occult Blood, POC: NEGATIVE
Fecal Occult Blood, POC: NEGATIVE

## 2018-11-01 NOTE — Progress Notes (Deleted)
MEDICARE ANNUAL WELLNESS VISIT AND FOLLOW UP  Assessment:   Diagnoses and all orders for this visit:  Encounter for Medicare annual wellness exam  Essential hypertension Continue medications Monitor blood pressure at home; call if consistently over 130/80 Continue DASH diet.   Reminder to go to the ER if any CP, SOB, nausea, dizziness, severe HA, changes vision/speech, left arm numbness and tingling and jaw pain.  Vitamin D deficiency At goal at recent check; continue to recommend supplementation for goal of 70-100 Defer vitamin D level  Prediabetes Discussed disease and risks Discussed diet/exercise, weight management  A1C  Obesity (BMI 30.0-34.9) Long discussion about weight loss, diet, and exercise Recommended diet heavy in fruits and veggies and low in animal meats, cheeses, and dairy products, appropriate calorie intake Discussed appropriate weight for height Follow up at next visit  Mixed hyperlipidemia Continue medications: crestor Continue low cholesterol diet and exercise.  Check lipid panel.   Medication management CBC, CMP/GFR  Fibromyalgia Lifestyle discussed: diet/exerise, sleep hygiene, stress management, hydration Continue current medications for sleep management  Depression, major, recurrent, in partial remission ([place]) Continue medications  Lifestyle discussed: diet/exerise, sleep hygiene, stress management, hydration  OSA on CPAP CPAP - reporting 100% compliance with improved restorative sleep and no excessive daytime sleepiness.   Primary osteoarthritis of both knees Followed by Dr. Berenice Primas, s/p R TKA, mobic as needed   Over 40 minutes of exam, counseling, chart review and critical decision making was performed Future Appointments  Date Time Provider [place]  11/02/2018 10:45 AM Liane Comber, NP [facility] None  02/15/2019 10:30 AM Unk Pinto, MD [facility] None  08/15/2019  9:00 AM Unk Pinto, MD [facility] None      Plan:   During the course of the visit the patient was educated and counseled about appropriate screening and preventive services including:    Pneumococcal vaccine   Prevnar 13  Influenza vaccine  Td vaccine  Screening electrocardiogram  Bone densitometry screening  Colorectal cancer screening  Diabetes screening  Glaucoma screening  Nutrition counseling   Advanced directives: requested   Subjective:  [name] is a 63 y.o. female who presents for Medicare Annual Wellness Visit and 3 month follow up.   Patient has been on SS Disability since age 31 (2007) for Panic, Anxiety & Depression, and also has fibromyalgia. She is followed by PA Gareth Eagle for mental health management. She reports she is doing well, but is under a lot of stress at this time related to an emergent surgery for her aunt whom she is caring for.   She currently uses klonopin very rarely to help with sleep (1-2 tabs per month). This is managed by mental health provider.   OSA on CPAP with reported 100% compliance and endorses restorative sleep.   BMI is There is no height or weight on file to calculate BMI., she has not been working on diet and exercise. Wt Readings from Last 3 Encounters:  07/27/18 197 lb 12.8 oz (89.7 kg)  04/13/18 198 lb (89.8 kg)  01/05/18 195 lb 3.2 oz (88.5 kg)    Her blood pressure has been controlled at home, today their BP is   She does not workout. She denies chest pain, shortness of breath, dizziness.   She is on cholesterol medication (crestor 20 mg MWF) and denies myalgias. Her LDL cholesterol is at goal; trigs remain borderline elevated. The cholesterol last visit was:   Lab Results  Component Value Date   CHOL 162 07/27/2018  HDL 51 07/27/2018   LDLCALC 91 07/27/2018   TRIG 100 07/27/2018   CHOLHDL 3.2 07/27/2018    She has not been working on diet and exercise for prediabetes, and denies foot ulcerations, increased appetite, nausea, paresthesia  of the feet, polydipsia, polyuria, visual disturbances, vomiting and weight loss. Last A1C in the office was:  Lab Results  Component Value Date   HGBA1C 5.8 (H) 07/27/2018   Last GFR: Lab Results  Component Value Date   GFRNONAA 94 07/27/2018   Patient is on Vitamin D supplement and at goal at recent check:   Lab Results  Component Value Date   VD25OH 72 07/27/2018      Medication Review: Current Outpatient Medications on File Prior to Visit  Medication Sig Dispense Refill  . acetaminophen (TYLENOL) 500 MG tablet Take 500-1,000 mg by mouth every 6 (six) hours as needed for pain.    Marland Kitchen acyclovir (ZOVIRAX) 400 MG tablet Take 1 tablet (400 mg total) by mouth 3 (three) times daily. 21 tablet 2  . aspirin 81 MG tablet Take 81 mg by mouth daily.    Marland Kitchen atenolol (TENORMIN) 50 MG tablet Take 1 tablet every morning for BP 90 tablet 3  . benzonatate (TESSALON) 200 MG capsule Take 1 capsule (200 mg total) by mouth 3 (three) times daily as needed for cough (Max: 600mg  per day). 90 capsule 1  . Cetirizine HCl 10 MG CAPS Take 1 capsule by mouth daily.    . Cholecalciferol (VITAMIN D PO) Take 8,000 Int'l Units by mouth daily.     . clonazePAM (KLONOPIN) 1 MG tablet Take 1 mg by mouth at bedtime.    . fluticasone (FLONASE) 50 MCG/ACT nasal spray Use 1 to 2 sprays each nostril 1 to 2 x /day 48 g 3  . glucose blood (ACCU-CHEK AVIVA PLUS) test strip CHECK BLOOD SUGAR 1 TIME DAILY. DX-R73.03 100 each 1  . hydrochlorothiazide (HYDRODIURIL) 25 MG tablet TAKE 1 TABLET BY MOUTH EVERY MORNING FOR BLOOD PRESSURE AND FLUID RETENTION 90 tablet 1  . Ibuprofen 200 MG CAPS Take 1 capsule by mouth. PRN    . lamoTRIgine (LAMICTAL) 150 MG tablet Take 1 & 1/2 tablets at bedtime 135 tablet 1  . Lancets (ACCU-CHEK MULTICLIX) lancets Use to check blood glucose daily 100 each PRN  . Lysine 500 MG TABS Take by mouth daily. Currently taking 3 pill QD    . Magnesium 250 MG TABS Take 400 mg by mouth. Takes 3 pills per day     . meloxicam (MOBIC) 15 MG tablet Take 1/2 to 1 tablet daily with food for Pain & Inflammation 90 tablet 3  . Multiple Vitamin (MULTIVITAMIN WITH MINERALS) TABS tablet Take 0.5 tablets by mouth 2 (two) times daily.    Marland Kitchen OVER THE COUNTER MEDICATION 2 tablets daily. OsteoBioFlex    . QUEtiapine (SEROQUEL) 100 MG tablet Take 100 mg by mouth 3 (three) times daily.    . rosuvastatin (CRESTOR) 40 MG tablet TAKE 1 TABLET BY MOUTH DAILY (Patient taking differently: Take 1/2 tablet on M, W, F) 90 tablet 1   Current Facility-Administered Medications on File Prior to Visit  Medication Dose Route Frequency Provider Last Rate Last Dose  . 0.9 %  sodium chloride infusion  500 mL Intravenous Once Milus Banister, MD        Allergies  Allergen Reactions  . Latex     Band aids= if left on for extended period of time  . Celecoxib  REACTION: Rash (celebrex)  . Cephalexin     REACTION: Rash (keflex)  . Codeine     REACTION: Rash  . Erythromycin     REACTION: Rash (emycin)  . Sulfonamide Derivatives     REACTION: Rash  . Trazodone And Nefazodone     insomnia  . Venlafaxine     REACTION: Blurred Vision Insurance claims handler)    Current Problems (verified) Patient Active Problem List   Diagnosis Date Noted  . Osteoarthritis of both knees 03/20/2015  . Encounter for Medicare annual wellness exam 03/20/2015  . OSA on CPAP 08/15/2014  . Medication management 01/31/2014  . Fibromyalgia 01/31/2014  . Depression, major, recurrent, in partial remission ([place]) 01/31/2014  . Obesity (BMI 30.0-34.9) 01/31/2014  . Essential hypertension 08/09/2013  . Mixed hyperlipidemia 08/09/2013  . Other abnormal glucose (prediabetes) 08/09/2013  . Vitamin D deficiency 08/09/2013    Screening Tests Immunization History  Administered Date(s) Administered  . DT 07/05/2015  . Influenza Inj Mdck Quad With Preservative 05/28/2017, 04/13/2018  . Influenza Split 04/06/2012, 04/05/2013, 05/16/2014, 03/21/2015  .  Influenza,inj,quad, With Preservative 04/29/2016  . Pneumococcal Conjugate-13 05/16/2014  . Pneumococcal-Unspecified 06/22/1998  . Td 06/22/2004  . Zoster 10/21/2015   Preventative care: Last colonoscopy: 2019 Mammogram:  2019 DEXA: 2013 normal - would like to postpone Pap:  S/p Hysterectomy  Prior vaccinations: TD or Tdap: 2017 Influenza: 2019 Pneumococcal: 2000 Prevnar13: 2015 Shingles/Zostavax: 2017  Names of Other Physician/Practitioners you currently use: 1. [facility] here for primary care 2. Dr. Tye Savoy, eye doctor, last visit 2018 3. Dr. Marlou Sa, dentist, last visit 2018   Patient Care Team: Unk Pinto, MD as PCP - General (Internal Medicine) Dorna Leitz, MD as Consulting Physician (Orthopedic Surgery) Milus Banister, MD as Attending Physician (Gastroenterology) Sheralyn Boatman, MD as Consulting Physician (Psychiatry) [name], [place] (Optometry) [name], DDS as Referring Physician (Dentistry)  SURGICAL HISTORY She  has a past surgical history that includes Abdominal hysterectomy (2002); Joint replacement; Total knee arthroplasty (Right, 04/14/2013); and Colonoscopy (2009). FAMILY HISTORY Her family history includes COPD in her father; CVA in her maternal grandmother; Cancer in her father; Hypertension in her father and mother. SOCIAL HISTORY She  reports that she has never smoked. She has never used smokeless tobacco. She reports that she does not drink alcohol or use drugs.   MEDICARE WELLNESS OBJECTIVES: Physical activity:   Cardiac risk factors:   Depression/mood screen:   Depression screen [facility] 2/9 07/27/2018  Decreased Interest 0  Down, Depressed, Hopeless 0  PHQ - 2 Score 0  Altered sleeping -  Tired, decreased energy -  Change in appetite -  Feeling bad or failure about yourself  -  Trouble concentrating -  Moving slowly or fidgety/restless -  Suicidal thoughts -  PHQ-9 Score -    ADLs:  In  your present state of health, do you have any difficulty performing the following activities: 07/27/2018 01/05/2018  Hearing? N N  Vision? N N  Difficulty concentrating or making decisions? N N  Walking or climbing stairs? N N  Dressing or bathing? N N  Doing errands, shopping? N N  Some recent data might be hidden     Cognitive Testing  Alert? Yes  Normal Appearance?Yes  Oriented to person? Yes  Place? Yes   Time? Yes  Recall of three objects?  Yes  Can perform simple calculations? Yes  Displays appropriate judgment?Yes  Can read the correct time from a watch face?Yes  EOL planning:  Review of Systems  Constitutional: Negative for malaise/fatigue and weight loss.  HENT: Negative for congestion, hearing loss, sore throat and tinnitus.   Eyes: Negative for blurred vision and double vision.  Respiratory: Negative for cough, sputum production, shortness of breath and wheezing.   Cardiovascular: Negative for chest pain, palpitations, orthopnea, claudication, leg swelling and PND.  Gastrointestinal: Negative for abdominal pain, blood in stool, constipation, diarrhea, heartburn, melena, nausea and vomiting.  Genitourinary: Negative.   Musculoskeletal: Positive for joint pain (Bilateral knees, arthritis). Negative for falls and myalgias.  Skin: Negative for rash.  Neurological: Negative for dizziness, tingling, sensory change, weakness and headaches.  Endo/Heme/Allergies: Negative for environmental allergies and polydipsia.  Psychiatric/Behavioral: Negative.  Negative for depression, memory loss, substance abuse and suicidal ideas. The patient is not nervous/anxious and does not have insomnia.   All other systems reviewed and are negative.    Objective:     There were no vitals filed for this visit. There is no height or weight on file to calculate BMI.  General appearance: alert, no distress, WD/WN, female HEENT: normocephalic, sclerae anicteric, TMs pearly, nares patent, no  discharge or erythema, pharynx normal Oral cavity: MMM, no lesions Neck: supple, no lymphadenopathy, no thyromegaly, no masses Heart: RRR, normal S1, S2, no murmurs Lungs: CTA bilaterally, no wheezes, rhonchi, or rales Abdomen: +bs, soft, non tender, non distended, no masses, no hepatomegaly, no splenomegaly Musculoskeletal: nontender, no swelling, no obvious deformity Extremities: no edema, no cyanosis, no clubbing Pulses: 2+ symmetric, upper and lower extremities, normal cap refill Neurological: alert, oriented x 3, CN2-12 intact, strength normal upper extremities and lower extremities, sensation normal throughout, DTRs 2+ throughout, no cerebellar signs, gait normal Psychiatric: normal affect, behavior normal, pleasant, somewhat anxious   Medicare Attestation I have personally reviewed: The patient's medical and social history Their use of alcohol, tobacco or illicit drugs Their current medications and supplements The patient's functional ability including ADLs,fall risks, home safety risks, cognitive, and hearing and visual impairment Diet and physical activities Evidence for depression or mood disorders  The patient's weight, height, BMI, and visual acuity have been recorded in the chart.  I have made referrals, counseling, and provided education to the patient based on review of the above and I have provided the patient with a written personalized care plan for preventive services.     Izora Ribas, NP   11/01/2018

## 2018-11-02 ENCOUNTER — Encounter: Payer: Self-pay | Admitting: Adult Health

## 2018-11-02 ENCOUNTER — Other Ambulatory Visit: Payer: Self-pay

## 2018-11-02 ENCOUNTER — Ambulatory Visit: Payer: Self-pay | Admitting: Adult Health

## 2018-11-02 ENCOUNTER — Ambulatory Visit: Payer: Medicare Other | Admitting: Adult Health

## 2018-11-02 VITALS — BP 130/73 | HR 94 | Temp 97.5°F | Ht 65.0 in | Wt 195.0 lb

## 2018-11-02 DIAGNOSIS — Z9989 Dependence on other enabling machines and devices: Secondary | ICD-10-CM

## 2018-11-02 DIAGNOSIS — R7309 Other abnormal glucose: Secondary | ICD-10-CM

## 2018-11-02 DIAGNOSIS — M170 Bilateral primary osteoarthritis of knee: Secondary | ICD-10-CM | POA: Diagnosis not present

## 2018-11-02 DIAGNOSIS — Z0000 Encounter for general adult medical examination without abnormal findings: Secondary | ICD-10-CM

## 2018-11-02 DIAGNOSIS — R6889 Other general symptoms and signs: Secondary | ICD-10-CM

## 2018-11-02 DIAGNOSIS — G4733 Obstructive sleep apnea (adult) (pediatric): Secondary | ICD-10-CM | POA: Diagnosis not present

## 2018-11-02 DIAGNOSIS — E782 Mixed hyperlipidemia: Secondary | ICD-10-CM

## 2018-11-02 DIAGNOSIS — Z0001 Encounter for general adult medical examination with abnormal findings: Secondary | ICD-10-CM

## 2018-11-02 DIAGNOSIS — Z79899 Other long term (current) drug therapy: Secondary | ICD-10-CM

## 2018-11-02 DIAGNOSIS — M797 Fibromyalgia: Secondary | ICD-10-CM | POA: Diagnosis not present

## 2018-11-02 DIAGNOSIS — I10 Essential (primary) hypertension: Secondary | ICD-10-CM

## 2018-11-02 DIAGNOSIS — E559 Vitamin D deficiency, unspecified: Secondary | ICD-10-CM

## 2018-11-02 DIAGNOSIS — E669 Obesity, unspecified: Secondary | ICD-10-CM

## 2018-11-02 DIAGNOSIS — F3341 Major depressive disorder, recurrent, in partial remission: Secondary | ICD-10-CM | POA: Diagnosis not present

## 2018-11-02 NOTE — Progress Notes (Signed)
MEDICARE ANNUAL WELLNESS VISIT AND FOLLOW UP  Assessment:   Diagnoses and all orders for this visit:  Encounter for Medicare annual wellness exam  Essential hypertension Continue medications Monitor blood pressure at home; call if consistently over 130/80 Continue DASH diet.   Reminder to go to the ER if any CP, SOB, nausea, dizziness, severe HA, changes vision/speech, left arm numbness and tingling and jaw pain.  Vitamin D deficiency At goal at recent check; continue to recommend supplementation for goal of 60-100 Defer vitamin D level  Prediabetes Discussed disease and risks Discussed diet/exercise, weight management  A1C  Obesity (BMI 30.0-34.9) Long discussion about weight loss, diet, and exercise Recommended diet heavy in fruits and veggies and low in animal meats, cheeses, and dairy products, appropriate calorie intake Discussed appropriate weight for height Follow up at next visit  Mixed hyperlipidemia Continue medications: crestor Continue low cholesterol diet and exercise.  Check lipid panel.   Medication management CBC, CMP/GFR  Fibromyalgia Lifestyle discussed: diet/exerise, sleep hygiene, stress management, hydration Continue current medications for sleep management  Depression, major, recurrent, in partial remission ([place]) Continue medications  Lifestyle discussed: diet/exerise, sleep hygiene, stress management, hydration  OSA on CPAP CPAP - reporting 100% compliance with improved restorative sleep and no excessive daytime sleepiness.   Primary osteoarthritis of both knees Followed by Dr. Berenice Primas, s/p R TKA, pending L TKA, mobic as needed   Over 40 minutes of exam, counseling, chart review and critical decision making was performed Future Appointments  Date Time Provider [place]  02/15/2019 10:30 AM Unk Pinto, MD [facility] None  08/15/2019  9:00 AM Unk Pinto, MD [facility] None     Plan:   During the course of the visit  the patient was educated and counseled about appropriate screening and preventive services including:    Pneumococcal vaccine   Prevnar 13  Influenza vaccine  Td vaccine  Screening electrocardiogram  Bone densitometry screening  Colorectal cancer screening  Diabetes screening  Glaucoma screening  Nutrition counseling   Advanced directives: requested   Subjective:  [name] is a 63 y.o. female who presents for Medicare Annual Wellness Visit and 3 month follow up.   Patient has been on SS Disability since age 70 (2007) for Panic, Anxiety & Depression, and also has fibromyalgia. She is followed by PA Gareth Eagle for mental health management. She reports she is doing very well at this time.   She currently uses klonopin very rarely to help with sleep (1-2 tabs per month). This is managed by mental health provider.   OSA on CPAP with reported 100% compliance and endorses restorative sleep.   She is followed by Dr. Berenice Primas for L knee osteoarthritis, bone on bone, pending surgery which has been postponed due to covid 19.   BMI is Body mass index is 32.45 kg/m., she has been working on diet, exercise limited due to knee pain, she is cutting down on junk food, down to 188 lb on home scale, initial goal is <180 lb.  Wt Readings from Last 3 Encounters:  11/02/18 195 lb (88.5 kg)  07/27/18 197 lb 12.8 oz (89.7 kg)  04/13/18 198 lb (89.8 kg)    Her blood pressure has been controlled at home, today their BP is BP: 130/73 She does not workout. She denies chest pain, shortness of breath, dizziness.   She is on cholesterol medication (crestor 20 mg MWF) and denies myalgias. Her LDL cholesterol is at goal; trigs remain borderline elevated. The cholesterol last visit  was:   Lab Results  Component Value Date   CHOL 162 07/27/2018   HDL 51 07/27/2018   LDLCALC 91 07/27/2018   TRIG 100 07/27/2018   CHOLHDL 3.2 07/27/2018    She has not been working on diet and exercise for  prediabetes, and denies foot ulcerations, increased appetite, nausea, paresthesia of the feet, polydipsia, polyuria, visual disturbances, vomiting and weight loss. Last A1C in the office was:  Lab Results  Component Value Date   HGBA1C 5.8 (H) 07/27/2018   Last GFR: Lab Results  Component Value Date   GFRNONAA 94 07/27/2018   Patient is on Vitamin D supplement and at goal at recent check:   Lab Results  Component Value Date   VD25OH 72 07/27/2018      Medication Review: Current Outpatient Medications on File Prior to Visit  Medication Sig Dispense Refill  . acetaminophen (TYLENOL) 500 MG tablet Take 500-1,000 mg by mouth every 6 (six) hours as needed for pain.    Marland Kitchen acyclovir (ZOVIRAX) 400 MG tablet Take 1 tablet (400 mg total) by mouth 3 (three) times daily. 21 tablet 2  . aspirin 81 MG tablet Take 81 mg by mouth daily.    . benzonatate (TESSALON) 200 MG capsule Take 1 capsule (200 mg total) by mouth 3 (three) times daily as needed for cough (Max: 600mg  per day). 90 capsule 1  . Cetirizine HCl 10 MG CAPS Take 1 capsule by mouth daily.    . Cholecalciferol (VITAMIN D PO) Take 8,000 Int'l Units by mouth daily.     . clonazePAM (KLONOPIN) 1 MG tablet Take 1 mg by mouth at bedtime.    . fluticasone (FLONASE) 50 MCG/ACT nasal spray Use 1 to 2 sprays each nostril 1 to 2 x /day (Patient taking differently: as needed. Use 1 to 2 sprays each nostril 1 to 2 x /day) 48 g 3  . glucose blood (ACCU-CHEK AVIVA PLUS) test strip CHECK BLOOD SUGAR 1 TIME DAILY. DX-R73.03 100 each 1  . hydrochlorothiazide (HYDRODIURIL) 25 MG tablet TAKE 1 TABLET BY MOUTH EVERY MORNING FOR BLOOD PRESSURE AND FLUID RETENTION 90 tablet 1  . Ibuprofen 200 MG CAPS Take 1 capsule by mouth. PRN    . lamoTRIgine (LAMICTAL) 150 MG tablet Take 1 & 1/2 tablets at bedtime 135 tablet 1  . Lancets (ACCU-CHEK MULTICLIX) lancets Use to check blood glucose daily 100 each PRN  . Lysine 500 MG TABS Take by mouth daily. Currently taking 2  pill QD    . Magnesium 250 MG TABS Take 400 mg by mouth. Takes 1000mg  TID    . meloxicam (MOBIC) 15 MG tablet Take 1/2 to 1 tablet daily with food for Pain & Inflammation 90 tablet 3  . Multiple Vitamin (MULTIVITAMIN WITH MINERALS) TABS tablet Take 0.5 tablets by mouth 2 (two) times daily.    . QUEtiapine (SEROQUEL) 100 MG tablet Take 100 mg by mouth 3 (three) times daily.    . rosuvastatin (CRESTOR) 40 MG tablet TAKE 1 TABLET BY MOUTH DAILY (Patient taking differently: Take 1/2 tablet on M, W, F) 90 tablet 1  . atenolol (TENORMIN) 50 MG tablet Take 1 tablet every morning for BP (Patient not taking: Reported on 11/02/2018) 90 tablet 3  . OVER THE COUNTER MEDICATION 2 tablets daily. OsteoBioFlex     Current Facility-Administered Medications on File Prior to Visit  Medication Dose Route Frequency Provider Last Rate Last Dose  . 0.9 %  sodium chloride infusion  500 mL Intravenous Once  Milus Banister, MD        Allergies  Allergen Reactions  . Latex     Band aids= if left on for extended period of time  . Celecoxib     REACTION: Rash (celebrex)  . Cephalexin     REACTION: Rash (keflex)  . Codeine     REACTION: Rash  . Erythromycin     REACTION: Rash (emycin)  . Sulfonamide Derivatives     REACTION: Rash  . Trazodone And Nefazodone     insomnia  . Venlafaxine     REACTION: Blurred Vision Insurance claims handler)    Current Problems (verified) Patient Active Problem List   Diagnosis Date Noted  . Osteoarthritis of both knees 03/20/2015  . Encounter for Medicare annual wellness exam 03/20/2015  . OSA on CPAP 08/15/2014  . Medication management 01/31/2014  . Fibromyalgia 01/31/2014  . Depression, major, recurrent, in partial remission ([place]) 01/31/2014  . Obesity (BMI 30.0-34.9) 01/31/2014  . Essential hypertension 08/09/2013  . Mixed hyperlipidemia 08/09/2013  . Other abnormal glucose (prediabetes) 08/09/2013  . Vitamin D deficiency 08/09/2013    Screening Tests Immunization History   Administered Date(s) Administered  . DT 07/05/2015  . Influenza Inj Mdck Quad With Preservative 05/28/2017, 04/13/2018  . Influenza Split 04/06/2012, 04/05/2013, 05/16/2014, 03/21/2015  . Influenza,inj,quad, With Preservative 04/29/2016  . Pneumococcal Conjugate-13 05/16/2014  . Pneumococcal-Unspecified 06/22/1998  . Td 06/22/2004  . Zoster 10/21/2015   Preventative care: Last colonoscopy: 2019 due 11/2027 Mammogram:  2019 will be schedule  DEXA: 2013 normal - would like to postpone Pap:  S/p Hysterectomy  Prior vaccinations:  TD or Tdap: 2017 Influenza: 2019 Pneumococcal: 2000 Prevnar13: 2015 Shingles/Zostavax: 2017  Names of Other Physician/Practitioners you currently use: 1. [facility] here for primary care 2. Dr. Sabra Heck, eye doctor, last visit 2019, glasses, goes annually 3. Dr. Bosie Helper, dentist, last visit 2019, goes q53m   Patient Care Team: Unk Pinto, MD as PCP - General (Internal Medicine) Dorna Leitz, MD as Consulting Physician (Orthopedic Surgery) Milus Banister, MD as Attending Physician (Gastroenterology) Sheralyn Boatman, MD as Consulting Physician (Psychiatry) [name], [place] (Optometry) [name], DDS as Referring Physician (Dentistry)  SURGICAL HISTORY She  has a past surgical history that includes Abdominal hysterectomy (2002); Joint replacement; Total knee arthroplasty (Right, 04/14/2013); and Colonoscopy (2009). FAMILY HISTORY Her family history includes COPD in her father; CVA in her maternal grandmother; Cancer in her father; Hypertension in her father and mother. SOCIAL HISTORY She  reports that she has never smoked. She has never used smokeless tobacco. She reports that she does not drink alcohol or use drugs.   MEDICARE WELLNESS OBJECTIVES: Physical activity:   Cardiac risk factors:   Depression/mood screen:   Depression screen [facility] 2/9 07/27/2018  Decreased Interest 0  Down, Depressed,  Hopeless 0  PHQ - 2 Score 0  Altered sleeping -  Tired, decreased energy -  Change in appetite -  Feeling bad or failure about yourself  -  Trouble concentrating -  Moving slowly or fidgety/restless -  Suicidal thoughts -  PHQ-9 Score -    ADLs:  In your present state of health, do you have any difficulty performing the following activities: 07/27/2018 01/05/2018  Hearing? N N  Vision? N N  Difficulty concentrating or making decisions? N N  Walking or climbing stairs? N N  Dressing or bathing? N N  Doing errands, shopping? N N  Some recent data might be hidden  Cognitive Testing  Alert? Yes  Normal Appearance?Yes  Oriented to person? Yes  Place? Yes   Time? Yes  Recall of three objects?  Yes  Can perform simple calculations? Yes  Displays appropriate judgment?Yes  Can read the correct time from a watch face?Yes  EOL planning:    Review of Systems  Constitutional: Negative for malaise/fatigue and weight loss.  HENT: Negative for congestion, hearing loss, sore throat and tinnitus.   Eyes: Negative for blurred vision and double vision.  Respiratory: Negative for cough, sputum production, shortness of breath and wheezing.   Cardiovascular: Negative for chest pain, palpitations, orthopnea, claudication, leg swelling and PND.  Gastrointestinal: Negative for abdominal pain, blood in stool, constipation, diarrhea, heartburn, melena, nausea and vomiting.  Genitourinary: Negative.   Musculoskeletal: Positive for joint pain (L knees, arthritis, pending surgery). Negative for falls and myalgias.  Skin: Negative for rash.  Neurological: Negative for dizziness, tingling, sensory change, weakness and headaches.  Endo/Heme/Allergies: Negative for environmental allergies and polydipsia.  Psychiatric/Behavioral: Negative.  Negative for depression, memory loss, substance abuse and suicidal ideas. The patient is not nervous/anxious and does not have insomnia.   All other systems reviewed  and are negative.    Objective:     Today's Vitals   11/02/18 1035  BP: 130/73  Pulse: 94  Temp: (!) 97.5 F (36.4 C)  SpO2: 97%  Weight: 195 lb (88.5 kg)  Height: 5\' 5"  (1.651 m)  PainSc: 2   PainLoc: Knee   Body mass index is 32.45 kg/m.  General appearance: alert, no distress, WD/WN, female HEENT: normocephalic, sclerae anicteric, TMs pearly, nares patent, no discharge or erythema, pharynx normal Oral cavity: MMM, no lesions Neck: supple, no lymphadenopathy, no thyromegaly, no masses Heart: RRR, normal S1, S2, no murmurs Lungs: CTA bilaterally, no wheezes, rhonchi, or rales Abdomen: +bs, soft, non tender, non distended, no masses, no hepatomegaly, no splenomegaly Musculoskeletal: nontender, no swelling, no obvious deformity, antalgic gait, crepitus in L knee Extremities: no edema, no cyanosis, no clubbing Pulses: 2+ symmetric, upper and lower extremities, normal cap refill Neurological: alert, oriented x 3, CN2-12 intact, strength normal upper extremities and lower extremities, sensation normal throughout, DTRs 2+ throughout, no cerebellar signs, gait antalgit Psychiatric: normal affect, behavior normal, pleasant, somewhat anxious   Medicare Attestation I have personally reviewed: The patient's medical and social history Their use of alcohol, tobacco or illicit drugs Their current medications and supplements The patient's functional ability including ADLs,fall risks, home safety risks, cognitive, and hearing and visual impairment Diet and physical activities Evidence for depression or mood disorders  The patient's weight, height, BMI, and visual acuity have been recorded in the chart.  I have made referrals, counseling, and provided education to the patient based on review of the above and I have provided the patient with a written personalized care plan for preventive services.     Izora Ribas, NP   11/02/2018

## 2018-11-02 NOTE — Patient Instructions (Signed)
[name] , Thank you for taking time to come for your Medicare Wellness Visit. I appreciate your ongoing commitment to your health goals. Please review the following plan we discussed and let me know if I can assist you in the future.   These are the goals we discussed: Goals    . Exercise 150 min/wk Moderate Activity    . HEMOGLOBIN A1C < 5.7    . Weight (lb) < 180 lb (81.6 kg)       This is a list of the screening recommended for you and due dates:  Health Maintenance  Topic Date Due  . Flu Shot  01/21/2019  . Mammogram  12/04/2019  . Tetanus Vaccine  07/04/2025  . Colon Cancer Screening  12/02/2027  .  Hepatitis C: One time screening is recommended by Center for Disease Control  (CDC) for  adults born from [birth year] through 1964.   Completed  . HIV Screening  Completed  . Pap Smear  Discontinued    Please complete your advanced directives/living will and bring by copies after having notarized so we can put in chart    Preparing for Knee Replacement Getting prepared before knee replacement surgery can make your recovery easier and more comfortable. This document provides some tips and guidelines that will help you prepare for your surgery. Talk with your health care provider so you can learn what to expect before, during, and after surgery. Ask questions if you do not understand something. To ease concerns about your financial responsibilities, call your insurance company as soon as you decide to have surgery. Ask how much of your surgery and hospital stay will be covered. Also ask about coverage for medical equipment, rehabilitation facilities, and home care. How should I arrange for help? In the first couple weeks after surgery, it will likely be harder for you to do some of your regular activities. You may get tired easily, and you will have limited movement in your leg. Follow these guidelines to make sure you have all the help you need after your surgery:  Plan to have someone  take you home from the hospital. Your health care provider will tell you how many days you can expect to be in the hospital.  Cancel all your work, caregiving, and volunteer responsibilities for at least 4-6 weeks after your surgery.  Plan to have someone stay with you day and night for the first week. This person should be someone you are comfortable with. You may need this person to help you with your exercises and personal care, such as bathing and using the toilet.  If you live alone, arrange for someone to take care of your home and pets for the first 4-6 weeks after surgery.  Arrange for drivers to take you to and from follow-up appointments, the grocery store, and other places you may need to go for at least 4-6 weeks.  Consider applying for a disabled parking permit. To get an application, contact the Department of Motor Vehicles or your health care provider's office. How should I prepare my home?      Pick a recovery spot, but do not plan on recovering in bed. Sitting upright is better for your health. You may want to use a recliner with a small table nearby. Place the items you use most frequently on that table. These may include the TV remote, a cordless phone, a cell phone, a book or laptop computer, and a water glass.  To see if you will  be able to move around in your home with a walker, hold your hands out about 6 inches (15 cm) from your sides, and walk from your recovery spot to your kitchen and bathroom. Then walk from your bed to the bathroom. If you do not hit anything with your hands, you will have enough room for a walker.  Minimize the use of stairs after you return home to reduce your risk of falling or tripping.  Remove all clutter from your floors. Also remove any throw rugs. This will help you avoid tripping after your surgery.  Move the items you use most often in your kitchen, bathroom, and bedroom to shelves and drawers that are at countertop height.  Prepare a  few meals to freeze and reheat later.  Consider getting safety equipment that will be helpful during your recovery, such as: ? Grab bars added in the shower and near the toilet. ? A raised toilet seat to help you get on and off the toilet more easily. ? A tub or shower bench. How should I prepare my body?  Have a preoperative exam. ? During the exam, your health care provider will make sure that your body is healthy enough to safely have the surgery. ? When you go to the exam, bring a complete list of all your medicines and supplements, including herbs and vitamins. ? You may need to have additional tests to ensure your safety.  Have elective dental care and routine cleanings done before your surgery. Germs from anywhere in your body, including your mouth, can travel to your new joint and infect it. It is important that you do not have any dental work done for at least 3 months after your surgery.  Maintain a healthy diet. Do not change your diet before surgery unless your health care provider tells you to do that.  Do not use any products that contain nicotine or tobacco, such as cigarettes and e-cigarettes. These can delay bone healing after surgery. If you need help quitting, ask your health care provider.  Tell your health care provider if: ? You develop any skin infections or skin irritations. You may need to improve the condition of your skin before surgery. ? You have a fever, a cold, or any other illness in the week before your surgery.  Do not drink any alcohol for at least 48 hours before surgery.  The day before your surgery, follow instructions from your health care provider about showering, eating, drinking, and taking medicines. These directions are for your safety.  Talk to your health care provider about doing exercises before your surgery. ? Be sure to follow the exercise program only as directed by your health care provider. ? Doing these exercises in the weeks before  your surgery may help reduce pain and improve function after surgery. Summary  Getting prepared before knee replacement surgery can make your recovery easier and more comfortable.  Prepare your home and arrange for help at home.  Keep all of your preoperative appointments to ensure that you are ready for your surgery.  Plan to have someone take you home from the hospital and stay with you day and night for the first week. This information is not intended to replace advice given to you by your health care provider. Make sure you discuss any questions you have with your health care provider. Document Released: 09/12/2010 Document Revised: 07/26/2017 Document Reviewed: 07/26/2017 Elsevier Interactive Patient Education  2019 [employer].

## 2018-11-03 LAB — COMPLETE METABOLIC PANEL WITH GFR
AG Ratio: 1.5 (calc) (ref 1.0–2.5)
ALT: 35 U/L — ABNORMAL HIGH (ref 6–29)
AST: 28 U/L (ref 10–35)
Albumin: 4.4 g/dL (ref 3.6–5.1)
Alkaline phosphatase (APISO): 90 U/L (ref 37–153)
BUN: 13 mg/dL (ref 7–25)
CO2: 28 mmol/L (ref 20–32)
Calcium: 9.8 mg/dL (ref 8.6–10.4)
Chloride: 102 mmol/L (ref 98–110)
Creat: 0.67 mg/dL (ref 0.50–0.99)
GFR, Est African American: 110 mL/min/{1.73_m2} (ref >=60)
GFR, Est Non African American: 95 mL/min/{1.73_m2} (ref >=60)
Globulin: 2.9 g/dL (calc) (ref 1.9–3.7)
Glucose, Bld: 99 mg/dL (ref 65–99)
Potassium: 4.3 mmol/L (ref 3.5–5.3)
Sodium: 140 mmol/L (ref 135–146)
Total Bilirubin: 0.5 mg/dL (ref 0.2–1.2)
Total Protein: 7.3 g/dL (ref 6.1–8.1)

## 2018-11-03 LAB — CBC WITH DIFFERENTIAL/PLATELET
Absolute Monocytes: 459 cells/uL (ref 200–950)
Basophils Absolute: 41 cells/uL (ref 0–200)
Basophils Relative: 0.8 %
Eosinophils Absolute: 189 cells/uL (ref 15–500)
Eosinophils Relative: 3.7 %
HCT: 42.6 % (ref 35.0–45.0)
Hemoglobin: 14.6 g/dL (ref 11.7–15.5)
Lymphs Abs: 1576 cells/uL (ref 850–3900)
MCH: 30.9 pg (ref 27.0–33.0)
MCHC: 34.3 g/dL (ref 32.0–36.0)
MCV: 90.3 fL (ref 80.0–100.0)
MPV: 10.9 fL (ref 7.5–12.5)
Monocytes Relative: 9 %
Neutro Abs: 2836 cells/uL (ref 1500–7800)
Neutrophils Relative %: 55.6 %
Platelets: 311 10*3/uL (ref 140–400)
RBC: 4.72 10*6/uL (ref 3.80–5.10)
RDW: 12.6 % (ref 11.0–15.0)
Total Lymphocyte: 30.9 %
WBC: 5.1 10*3/uL (ref 3.8–10.8)

## 2018-11-03 LAB — TSH: TSH: 1.2 mIU/L (ref 0.40–4.50)

## 2018-11-03 LAB — HEMOGLOBIN A1C
Hgb A1c MFr Bld: 5.9 % of total Hgb — ABNORMAL HIGH (ref <5.7)
Mean Plasma Glucose: 123 (calc)
eAG (mmol/L): 6.8 (calc)

## 2018-11-03 LAB — LIPID PANEL
Cholesterol: 161 mg/dL (ref <200)
HDL: 44 mg/dL — ABNORMAL LOW (ref >=50)
LDL Cholesterol (Calc): 90 mg/dL (calc)
Non-HDL Cholesterol (Calc): 117 mg/dL (calc) (ref <130)
Total CHOL/HDL Ratio: 3.7 (calc) (ref <5.0)
Triglycerides: 169 mg/dL — ABNORMAL HIGH (ref <150)

## 2018-11-03 LAB — MAGNESIUM: Magnesium: 2.1 mg/dL (ref 1.5–2.5)

## 2018-12-08 ENCOUNTER — Other Ambulatory Visit: Payer: Self-pay | Admitting: Orthopedic Surgery

## 2019-01-02 NOTE — Progress Notes (Signed)
EKG 07-27-18 EPIC

## 2019-01-02 NOTE — Patient Instructions (Addendum)
YOU NEED TO HAVE A COVID 19 TEST ON 01-03-19  @ 2:00 PM, THIS TEST MUST BE DONE BEFORE SURGERY, COME TO [place] ENTRANCE. ONCE YOUR COVID TEST IS COMPLETED, PLEASE BEGIN THE QUARANTINE INSTRUCTIONS AS OUTLINED IN YOUR HANDOUT.                [name]    Your procedure is scheduled on: 01-06-2019   Report to [facility] Main  Entrance    Report to admitting at 1055 AM    BRING CPAP [place]   Call this number if you have problems the morning of surgery [phone]    Remember:    NO SOLID FOOD AFTER MIDNIGHT THE NIGHT PRIOR TO SURGERY. NOTHING BY MOUTH EXCEPT CLEAR LIQUIDS UNTIL 1025 AM. PLEASE FINISH  G2 DRINK PER SURGEON ORDER 3 HOURS PRIOR TO SCHEDULED SURGERY TIME WHICH NEEDS TO BE COMPLETED AT 1025 AM.   CLEAR LIQUID DIET   Foods Allowed                                                                     Foods Excluded  Coffee and tea, regular and decaf                             liquids that you cannot  Plain Jell-O in any flavor                                             see through such as: Fruit ices (not with fruit pulp)                                     milk, soups, orange juice  Iced Popsicles                                    All solid food Carbonated beverages, regular and diet                                    Cranberry, grape and apple juices Sports drinks like Gatorade Lightly seasoned clear broth or consume(fat free) Sugar, honey syrup  Sample Menu Breakfast                                Lunch                                     Supper Cranberry juice                    Beef broth  Chicken broth Jell-O                                     Grape juice                           Apple juice Coffee or tea                        Jell-O                                      Popsicle                                                Coffee or tea                        Coffee or  tea  _____________________________________________________________________     Take these medicines the morning of surgery with A SIP OF WATER: Cetirizine (Zyrtec), and Quetiapine (Seroquel)  BRUSH YOUR TEETH MORNING OF SURGERY AND RINSE YOUR MOUTH OUT, NO CHEWING GUM CANDY OR MINTS.                               You may not have any metal on your body including hair pins and              piercings     Do not wear jewelry, make-up, lotions, powders or perfumes, deodorant              Do not wear nail polish.  Do not shave  48 hours prior to surgery.              Do not bring valuables to the hospital. [place].  Contacts, dentures or bridgework may not be worn into surgery.       _____________________________________________________________________             [facility] - Preparing for Surgery Before surgery, you can play an important role.  Because skin is not sterile, your skin needs to be as free of germs as possible.  You can reduce the number of germs on your skin by washing with CHG (chlorahexidine gluconate) soap before surgery.  CHG is an antiseptic cleaner which kills germs and bonds with the skin to continue killing germs even after washing. Please DO NOT use if you have an allergy to CHG or antibacterial soaps.  If your skin becomes reddened/irritated stop using the CHG and inform your nurse when you arrive at Short Stay. Do not shave (including legs and underarms) for at least 48 hours prior to the first CHG shower.  You may shave your face/neck. Please follow these instructions carefully:  1.  Shower with CHG Soap the night before surgery and the  morning of Surgery.  2.  If you choose to wash your hair, wash your hair first as usual with your  normal  shampoo.  3.  After  you shampoo, rinse your hair and body thoroughly to remove the  shampoo.                           4.  Use CHG as you would any other liquid soap.   You can apply chg directly  to the skin and wash                       Gently with a scrungie or clean washcloth.  5.  Apply the CHG Soap to your body ONLY FROM THE NECK DOWN.   Do not use on face/ open                           Wound or open sores. Avoid contact with eyes, ears mouth and genitals (private parts).                       Wash face,  Genitals (private parts) with your normal soap.             6.  Wash thoroughly, paying special attention to the area where your surgery  will be performed.  7.  Thoroughly rinse your body with warm water from the neck down.  8.  DO NOT shower/wash with your normal soap after using and rinsing off  the CHG Soap.                9.  Pat yourself dry with a clean towel.            10.  Wear clean pajamas.            11.  Place clean sheets on your bed the night of your first shower and do not  sleep with pets. Day of Surgery : Do not apply any lotions/deodorants the morning of surgery.  Please wear clean clothes to the hospital/surgery center.  FAILURE TO FOLLOW THESE INSTRUCTIONS MAY RESULT IN THE CANCELLATION OF YOUR SURGERY PATIENT SIGNATURE_________________________________  NURSE SIGNATURE__________________________________  ________________________________________________________________________   [name]  An incentive spirometer is a tool that can help keep your lungs clear and active. This tool measures how well you are filling your lungs with each breath. Taking long deep breaths may help reverse or decrease the chance of developing breathing (pulmonary) problems (especially infection) following:  A long period of time when you are unable to move or be active. BEFORE THE PROCEDURE   If the spirometer includes an indicator to show your best effort, your nurse or respiratory therapist will set it to a desired goal.  If possible, sit up straight or lean slightly forward. Try not to slouch.  Hold the incentive spirometer in an  upright position. INSTRUCTIONS FOR USE  1. Sit on the edge of your bed if possible, or sit up as far as you can in bed or on a chair. 2. Hold the incentive spirometer in an upright position. 3. Breathe out normally. 4. Place the mouthpiece in your mouth and seal your lips tightly around it. 5. Breathe in slowly and as deeply as possible, raising the piston or the ball toward the top of the column. 6. Hold your breath for 3-5 seconds or for as long as possible. Allow the piston or ball to fall to the bottom of the column. 7. Remove the mouthpiece from your mouth and breathe out normally. 8. Rest for a  few seconds and repeat Steps 1 through 7 at least 10 times every 1-2 hours when you are awake. Take your time and take a few normal breaths between deep breaths. 9. The spirometer may include an indicator to show your best effort. Use the indicator as a goal to work toward during each repetition. 10. After each set of 10 deep breaths, practice coughing to be sure your lungs are clear. If you have an incision (the cut made at the time of surgery), support your incision when coughing by placing a pillow or rolled up towels firmly against it. Once you are able to get out of bed, walk around indoors and cough well. You may stop using the incentive spirometer when instructed by your caregiver.  RISKS AND COMPLICATIONS  Take your time so you do not get dizzy or light-headed.  If you are in pain, you may need to take or ask for pain medication before doing incentive spirometry. It is harder to take a deep breath if you are having pain. AFTER USE  Rest and breathe slowly and easily.  It can be helpful to keep track of a log of your progress. Your caregiver can provide you with a simple table to help with this. If you are using the spirometer at home, follow these instructions: [place] IF:   You are having difficultly using the spirometer.  You have trouble using the spirometer as often as  instructed.  Your pain medication is not giving enough relief while using the spirometer.  You develop fever of 100.5 F (38.1 C) or higher. SEEK IMMEDIATE MEDICAL CARE IF:   You cough up bloody sputum that had not been present before.  You develop fever of 102 F (38.9 C) or greater.  You develop worsening pain at or near the incision site. MAKE SURE YOU:   Understand these instructions.  Will watch your condition.  Will get help right away if you are not doing well or get worse. Document Released: 10/19/2006 Document Revised: 08/31/2011 Document Reviewed: 12/20/2006 [employer] Patient Information 2014 [employer], [place].   ________________________________________________________________________  WHAT IS A BLOOD TRANSFUSION? Blood Transfusion Information  A transfusion is the replacement of blood or some of its parts. Blood is made up of multiple cells which provide different functions.  Red blood cells carry oxygen and are used for blood loss replacement.  White blood cells fight against infection.  Platelets control bleeding.  Plasma helps clot blood.  Other blood products are available for specialized needs, such as hemophilia or other clotting disorders. BEFORE THE TRANSFUSION  Who gives blood for transfusions?   Healthy volunteers who are fully evaluated to make sure their blood is safe. This is blood bank blood. Transfusion therapy is the safest it has ever been in the practice of medicine. Before blood is taken from a donor, a complete history is taken to make sure that person has no history of diseases nor engages in risky social behavior (examples are intravenous drug use or sexual activity with multiple partners). The donor's travel history is screened to minimize risk of transmitting infections, such as malaria. The donated blood is tested for signs of infectious diseases, such as HIV and hepatitis. The blood is then tested to be sure it is compatible with you in  order to minimize the chance of a transfusion reaction. If you or a relative donates blood, this is often done in anticipation of surgery and is not appropriate for emergency situations. It takes many days to  process the donated blood. RISKS AND COMPLICATIONS Although transfusion therapy is very safe and saves many lives, the main dangers of transfusion include:   Getting an infectious disease.  Developing a transfusion reaction. This is an allergic reaction to something in the blood you were given. Every precaution is taken to prevent this. The decision to have a blood transfusion has been considered carefully by your caregiver before blood is given. Blood is not given unless the benefits outweigh the risks. AFTER THE TRANSFUSION  Right after receiving a blood transfusion, you will usually feel much better and more energetic. This is especially true if your red blood cells have gotten low (anemic). The transfusion raises the level of the red blood cells which carry oxygen, and this usually causes an energy increase.  The nurse administering the transfusion will monitor you carefully for complications. HOME CARE INSTRUCTIONS  No special instructions are needed after a transfusion. You may find your energy is better. Speak with your caregiver about any limitations on activity for underlying diseases you may have. SEEK MEDICAL CARE IF:   Your condition is not improving after your transfusion.  You develop redness or irritation at the intravenous (IV) site. SEEK IMMEDIATE MEDICAL CARE IF:  Any of the following symptoms occur over the next 12 hours:  Shaking chills.  You have a temperature by mouth above 102 F (38.9 C), not controlled by medicine.  Chest, back, or muscle pain.  People around you feel you are not acting correctly or are confused.  Shortness of breath or difficulty breathing.  Dizziness and fainting.  You get a rash or develop hives.  You have a decrease in urine  output.  Your urine turns a dark color or changes to pink, red, or brown. Any of the following symptoms occur over the next 10 days:  You have a temperature by mouth above 102 F (38.9 C), not controlled by medicine.  Shortness of breath.  Weakness after normal activity.  The white part of the eye turns yellow (jaundice).  You have a decrease in the amount of urine or are urinating less often.  Your urine turns a dark color or changes to pink, red, or brown. Document Released: 06/05/2000 Document Revised: 08/31/2011 Document Reviewed: 01/23/2008 [facility] Patient Information 2014 [place], [place].  _______________________________________________________________________

## 2019-01-03 ENCOUNTER — Other Ambulatory Visit (HOSPITAL_COMMUNITY)
Admission: RE | Admit: 2019-01-03 | Discharge: 2019-01-03 | Disposition: A | Discharge status: Home / Self Care | Payer: Medicare Other | Source: Ambulatory Visit | Attending: Orthopedic Surgery | Admitting: Orthopedic Surgery

## 2019-01-03 ENCOUNTER — Encounter (HOSPITAL_COMMUNITY)
Admission: RE | Admit: 2019-01-03 | Discharge: 2019-01-03 | Disposition: A | Discharge status: Home / Self Care | Payer: Medicare Other | Source: Ambulatory Visit | Attending: Orthopedic Surgery | Admitting: Orthopedic Surgery

## 2019-01-03 ENCOUNTER — Ambulatory Visit (HOSPITAL_COMMUNITY)
Admission: RE | Admit: 2019-01-03 | Discharge: 2019-01-03 | Disposition: A | Discharge status: Home / Self Care | Payer: Medicare Other | Source: Ambulatory Visit | Attending: Orthopedic Surgery | Admitting: Orthopedic Surgery

## 2019-01-03 ENCOUNTER — Encounter (HOSPITAL_COMMUNITY): Payer: Self-pay

## 2019-01-03 ENCOUNTER — Other Ambulatory Visit: Payer: Self-pay

## 2019-01-03 DIAGNOSIS — Z01811 Encounter for preprocedural respiratory examination: Secondary | ICD-10-CM | POA: Diagnosis present

## 2019-01-03 DIAGNOSIS — Z1159 Encounter for screening for other viral diseases: Secondary | ICD-10-CM | POA: Diagnosis not present

## 2019-01-03 DIAGNOSIS — M1712 Unilateral primary osteoarthritis, left knee: Secondary | ICD-10-CM | POA: Diagnosis not present

## 2019-01-03 LAB — APTT: aPTT: 34 seconds (ref 24–36)

## 2019-01-03 LAB — CBC WITH DIFFERENTIAL/PLATELET
Abs Immature Granulocytes: 0.03 10*3/uL (ref 0.00–0.07)
Basophils Absolute: 0 10*3/uL (ref 0.0–0.1)
Basophils Relative: 1 %
Eosinophils Absolute: 0.2 10*3/uL (ref 0.0–0.5)
Eosinophils Relative: 3 %
HCT: 44.9 % (ref 36.0–46.0)
Hemoglobin: 14.5 g/dL (ref 12.0–15.0)
Immature Granulocytes: 1 %
Lymphocytes Relative: 38 %
Lymphs Abs: 2.4 10*3/uL (ref 0.7–4.0)
MCH: 30.3 pg (ref 26.0–34.0)
MCHC: 32.3 g/dL (ref 30.0–36.0)
MCV: 93.9 fL (ref 80.0–100.0)
Monocytes Absolute: 0.7 10*3/uL (ref 0.1–1.0)
Monocytes Relative: 10 %
Neutro Abs: 3.1 10*3/uL (ref 1.7–7.7)
Neutrophils Relative %: 47 %
Platelets: 311 10*3/uL (ref 150–400)
RBC: 4.78 MIL/uL (ref 3.87–5.11)
RDW: 13.6 % (ref 11.5–15.5)
WBC: 6.4 10*3/uL (ref 4.0–10.5)
nRBC: 0 % (ref 0.0–0.2)

## 2019-01-03 LAB — COMPREHENSIVE METABOLIC PANEL
ALT: 42 U/L (ref 0–44)
AST: 30 U/L (ref 15–41)
Albumin: 4.7 g/dL (ref 3.5–5.0)
Alkaline Phosphatase: 89 U/L (ref 38–126)
Anion gap: 9 (ref 5–15)
BUN: 16 mg/dL (ref 8–23)
CO2: 28 mmol/L (ref 22–32)
Calcium: 9.8 mg/dL (ref 8.9–10.3)
Chloride: 104 mmol/L (ref 98–111)
Creatinine, Ser: 0.67 mg/dL (ref 0.44–1.00)
GFR calc Af Amer: >60 mL/min (ref >60)
GFR calc non Af Amer: >60 mL/min (ref >60)
Glucose, Bld: 84 mg/dL (ref 70–99)
Potassium: 3.9 mmol/L (ref 3.5–5.1)
Sodium: 141 mmol/L (ref 135–145)
Total Bilirubin: 0.6 mg/dL (ref 0.3–1.2)
Total Protein: 7.9 g/dL (ref 6.5–8.1)

## 2019-01-03 LAB — URINALYSIS, ROUTINE W REFLEX MICROSCOPIC
Bilirubin Urine: NEGATIVE
Glucose, UA: NEGATIVE mg/dL
Hgb urine dipstick: NEGATIVE
Ketones, ur: NEGATIVE mg/dL
Leukocytes,Ua: NEGATIVE
Nitrite: NEGATIVE
Protein, ur: NEGATIVE mg/dL
Specific Gravity, Urine: 1.013 (ref 1.005–1.030)
pH: 7 (ref 5.0–8.0)

## 2019-01-03 LAB — HEMOGLOBIN A1C
Hgb A1c MFr Bld: 5.8 % — ABNORMAL HIGH (ref 4.8–5.6)
Mean Plasma Glucose: 119.76 mg/dL

## 2019-01-03 LAB — SURGICAL PCR SCREEN
MRSA, PCR: NEGATIVE
Staphylococcus aureus: NEGATIVE

## 2019-01-03 LAB — PROTIME-INR
INR: 1 (ref 0.8–1.2)
Prothrombin Time: 13.2 seconds (ref 11.4–15.2)

## 2019-01-04 LAB — SARS CORONAVIRUS 2 (TAT 6-24 HRS): SARS Coronavirus 2: NEGATIVE

## 2019-01-05 MED ORDER — BUPIVACAINE LIPOSOME 1.3 % IJ SUSP
20.0000 mL | INTRAMUSCULAR | Status: DC
  Filled 2019-01-05: qty 20

## 2019-01-05 NOTE — Progress Notes (Signed)
Pt advised that surgery time for her 01-06-19 surgery is now 11:30 AM in lieu of 1:25 PM. Pt to report to admitting at 9:00 AM. Pt also to consume her Ensure pre-surgery drink by 8:30 AM. After that, nothing by mouth... Pt verbalized understanding.

## 2019-01-05 NOTE — H&P (Addendum)
TOTAL KNEE ADMISSION H&P  Patient is being admitted for left total knee arthroplasty.  Subjective:  Chief Complaint:left knee pain.  HPI: [name], 63 y.o. female, has a history of pain and functional disability in the left knee due to arthritis and has failed non-surgical conservative treatments for greater than 12 weeks to includeNSAID's and/or analgesics, corticosteriod injections, viscosupplementation injections, flexibility and strengthening excercises, weight reduction as appropriate and activity modification.  Onset of symptoms was gradual, starting 4 years ago with gradually worsening course since that time. The patient noted no past surgery on the left knee(s).  Patient currently rates pain in the left knee(s) at 7 out of 10 with activity. Patient has night pain, worsening of pain with activity and weight bearing, pain that interferes with activities of daily living, crepitus and joint swelling.  Patient has evidence of subchondral cysts, subchondral sclerosis, periarticular osteophytes and joint space narrowing by imaging studies. This patient has had failure of all reasonable conservative care. There is no active infection.  Patient Active Problem List   Diagnosis Date Noted  . Osteoarthritis of both knees 03/20/2015  . Encounter for Medicare annual wellness exam 03/20/2015  . OSA on CPAP 08/15/2014  . Medication management 01/31/2014  . Fibromyalgia 01/31/2014  . Depression, major, recurrent, in partial remission ([place]) 01/31/2014  . Obesity (BMI 30.0-34.9) 01/31/2014  . Essential hypertension 08/09/2013  . Mixed hyperlipidemia 08/09/2013  . Other abnormal glucose (prediabetes) 08/09/2013  . Vitamin D deficiency 08/09/2013   Past Medical History:  Diagnosis Date  . Arthritis   . Depression   . Elevated hemoglobin A1c   . Fibromyalgia   . Hypertension   . Other abnormal glucose 08/09/2013  . Sleep apnea    cpap   > 4 yrs  last sleep study  . Unspecified essential  hypertension 08/09/2013  . Unspecified vitamin D deficiency 08/09/2013  . Vitamin D deficiency     Past Surgical History:  Procedure Laterality Date  . ABDOMINAL HYSTERECTOMY  2002   Dr. Brien Mates   . COLONOSCOPY  2009  . JOINT REPLACEMENT     arthroscopy  . TOTAL KNEE ARTHROPLASTY Right 04/14/2013   Procedure: RIGHT TOTAL KNEE ARTHROPLASTY AND LEFT KNEE INJECTION ;  Surgeon: Alta Corning, MD;  Location: [facility];  Service: Orthopedics;  Laterality: Right;    Current Facility-Administered Medications  Medication Dose Route Frequency Provider Last Rate Last Dose  . 0.9 %  sodium chloride infusion  500 mL Intravenous Once Milus Banister, MD      . Derrill Memo ON 01/06/2019] bupivacaine liposome (EXPAREL) 1.3 % injection 266 mg  20 mL Other On Call to OR Dorna Leitz, MD       Current Outpatient Medications  Medication Sig Dispense Refill Last Dose  . acetaminophen (TYLENOL) 500 MG tablet Take 500-1,000 mg by mouth every 6 (six) hours as needed for pain.     Marland Kitchen acyclovir (ZOVIRAX) 400 MG tablet Take 1 tablet (400 mg total) by mouth 3 (three) times daily. (Patient taking differently: Take 400 mg by mouth 3 (three) times daily as needed (fever blisters). ) 21 tablet 2   . aspirin 81 MG tablet Take 81 mg by mouth daily.     . cetirizine (ZYRTEC) 10 MG tablet Take 10 mg by mouth daily.      . Cholecalciferol (VITAMIN D PO) Take 8,000 Int'l Units by mouth daily. 2000 units per capsule     . clonazePAM (KLONOPIN) 1 MG tablet Take 1 mg by  mouth at bedtime as needed for anxiety.      . fluticasone (FLONASE) 50 MCG/ACT nasal spray Use 1 to 2 sprays each nostril 1 to 2 x /day (Patient taking differently: Place 1 spray into both nostrils daily as needed for allergies. Use 1 to 2 sprays each nostril 1 to 2 x /day) 48 g 3   . hydrochlorothiazide (HYDRODIURIL) 25 MG tablet TAKE 1 TABLET BY MOUTH EVERY MORNING FOR BLOOD PRESSURE AND FLUID RETENTION (Patient taking differently: Take 25 mg by mouth daily. TAKE 1 TABLET  BY MOUTH EVERY MORNING FOR BLOOD PRESSURE AND FLUID RETENTION) 90 tablet 1   . lamoTRIgine (LAMICTAL) 150 MG tablet Take 1 & 1/2 tablets at bedtime (Patient taking differently: Take 150 mg by mouth at bedtime. ) 135 tablet 1   . Lysine 500 MG TABS Take 1,000 mg by mouth daily. Currently taking 2 pill QD     . Magnesium 500 MG TABS Take 1,000 mg by mouth 3 (three) times daily. Takes 1000mg  TID     . meloxicam (MOBIC) 15 MG tablet Take 1/2 to 1 tablet daily with food for Pain & Inflammation (Patient taking differently: Take 15 mg by mouth daily as needed for pain. Take 1/2 to 1 tablet daily with food for Pain & Inflammatio) 90 tablet 3   . Multiple Vitamin (MULTIVITAMIN WITH MINERALS) TABS tablet Take 0.5 tablets by mouth 2 (two) times daily.     . QUEtiapine (SEROQUEL) 100 MG tablet Take 100 mg by mouth 3 (three) times daily.     . rosuvastatin (CRESTOR) 40 MG tablet TAKE 1 TABLET BY MOUTH DAILY (Patient taking differently: Take 20 mg by mouth every Monday, Wednesday, and Friday. Take 1/2 tablet on M, W, F) 90 tablet 1   . glucose blood (ACCU-CHEK AVIVA PLUS) test strip CHECK BLOOD SUGAR 1 TIME DAILY. DX-R73.03 100 each 1   . Lancets (ACCU-CHEK MULTICLIX) lancets Use to check blood glucose daily 100 each PRN    Allergies  Allergen Reactions  . Latex     Band aids= if left on for extended period of time  . Celecoxib     REACTION: Rash (celebrex)  . Cephalexin     REACTION: Rash (keflex)  . Codeine     REACTION: Rash  . Erythromycin     REACTION: Rash (emycin)  . Sulfonamide Derivatives     REACTION: Rash  . Trazodone And Nefazodone     insomnia  . Venlafaxine     REACTION: Blurred Vision Insurance claims handler)    Social History   Tobacco Use  . Smoking status: Never Smoker  . Smokeless tobacco: Never Used  Substance Use Topics  . Alcohol use: No    Family History  Problem Relation Age of Onset  . Hypertension Mother   . Heart attack Mother   . Hypertension Father   . COPD Father   .  Cancer Father        thyroid  . CVA Maternal Grandmother 44  . Heart attack Maternal Grandfather 59  . Deep vein thrombosis Maternal Grandfather   . Colon cancer Neg Hx   . Esophageal cancer Neg Hx   . Stomach cancer Neg Hx      ROS ROS: I have reviewed the patient's review of systems thoroughly and there are no positive responses as relates to the HPI. Objective:  Physical Exam  Vital signs in last 24 hours:    Vitals:   01/06/19 0918  BP: 120/73  Pulse: 93  Resp: 16  Temp: 98.1 F (36.7 C)  SpO2: 95%    Well-developed well-nourished patient in no acute distress. Alert and oriented x3 HEENT:within normal limits Cardiac: Regular rate and rhythm Pulmonary: Lungs clear to auscultation Abdomen: Soft and nontender.  Normal active bowel sounds  Musculoskeletal: (left knee: Painful range of motion.  Limited range of motion.  No instability.  Trace effusion.  Neurovascular intact distally. Labs: Recent Results (from the past 2160 hour(s))  CBC with Differential/Platelet     Status: None   Collection Time: 11/02/18 11:31 AM  Result Value Ref Range   WBC 5.1 3.8 - 10.8 Thousand/uL   RBC 4.72 3.80 - 5.10 Million/uL   Hemoglobin 14.6 11.7 - 15.5 g/dL   HCT 42.6 35.0 - 45.0 %   MCV 90.3 80.0 - 100.0 fL   MCH 30.9 27.0 - 33.0 pg   MCHC 34.3 32.0 - 36.0 g/dL   RDW 12.6 11.0 - 15.0 %   Platelets 311 140 - 400 Thousand/uL   MPV 10.9 7.5 - 12.5 fL   Neutro Abs 2,836 1,500 - 7,800 cells/uL   Lymphs Abs 1,576 850 - 3,900 cells/uL   Absolute Monocytes 459 200 - 950 cells/uL   Eosinophils Absolute 189 15 - 500 cells/uL   Basophils Absolute 41 0 - 200 cells/uL   Neutrophils Relative % 55.6 %   Total Lymphocyte 30.9 %   Monocytes Relative 9.0 %   Eosinophils Relative 3.7 %   Basophils Relative 0.8 %  COMPLETE METABOLIC PANEL WITH GFR     Status: Abnormal   Collection Time: 11/02/18 11:31 AM  Result Value Ref Range   Glucose, Bld 99 65 - 99 mg/dL    Comment: .             Fasting reference interval .    BUN 13 7 - 25 mg/dL   Creat 0.67 0.50 - 0.99 mg/dL    Comment: For patients >60 years of age, the reference limit for Creatinine is approximately 13% higher for people identified as African-American. .    GFR, Est Non African American 95 > OR = 60 mL/min/1.17m2   GFR, Est African American 110 > OR = 60 mL/min/1.44m2   BUN/Creatinine Ratio NOT APPLICABLE 6 - 22 (calc)   Sodium 140 135 - 146 mmol/L   Potassium 4.3 3.5 - 5.3 mmol/L   Chloride 102 98 - 110 mmol/L   CO2 28 20 - 32 mmol/L   Calcium 9.8 8.6 - 10.4 mg/dL   Total Protein 7.3 6.1 - 8.1 g/dL   Albumin 4.4 3.6 - 5.1 g/dL   Globulin 2.9 1.9 - 3.7 g/dL (calc)   AG Ratio 1.5 1.0 - 2.5 (calc)   Total Bilirubin 0.5 0.2 - 1.2 mg/dL   Alkaline phosphatase (APISO) 90 37 - 153 U/L   AST 28 10 - 35 U/L   ALT 35 (H) 6 - 29 U/L  Magnesium     Status: None   Collection Time: 11/02/18 11:31 AM  Result Value Ref Range   Magnesium 2.1 1.5 - 2.5 mg/dL  Lipid panel     Status: Abnormal   Collection Time: 11/02/18 11:31 AM  Result Value Ref Range   Cholesterol 161 <200 mg/dL   HDL 44 (L) > OR = 50 mg/dL   Triglycerides 169 (H) <150 mg/dL   LDL Cholesterol (Calc) 90 mg/dL (calc)    Comment: Reference range: <100 . Desirable range <100 mg/dL for primary prevention;   <70 mg/dL for patients  with CHD or diabetic patients  with > or = 2 CHD risk factors. Marland Kitchen LDL-C is now calculated using the Martin-Hopkins  calculation, which is a validated novel method providing  better accuracy than the Friedewald equation in the  estimation of LDL-C.  Cresenciano Genre et al. Annamaria Helling. 0349;179(15): 2061-2068  (http://education.[employer].com/faq/FAQ164)    Total CHOL/HDL Ratio 3.7 <5.0 (calc)   Non-HDL Cholesterol (Calc) 117 <130 mg/dL (calc)    Comment: For patients with diabetes plus 1 major ASCVD risk  factor, treating to a non-HDL-C goal of <100 mg/dL  (LDL-C of <70 mg/dL) is considered a therapeutic  option.    TSH     Status: None   Collection Time: 11/02/18 11:31 AM  Result Value Ref Range   TSH 1.20 0.40 - 4.50 mIU/L  Hemoglobin A1c     Status: Abnormal   Collection Time: 11/02/18 11:31 AM  Result Value Ref Range   Hgb A1c MFr Bld 5.9 (H) <5.7 % of total Hgb    Comment: For someone without known diabetes, a hemoglobin  A1c value between 5.7% and 6.4% is consistent with prediabetes and should be confirmed with a  follow-up test. . For someone with known diabetes, a value <7% indicates that their diabetes is well controlled. A1c targets should be individualized based on duration of diabetes, age, comorbid conditions, and other considerations. . This assay result is consistent with an increased risk of diabetes. . Currently, no consensus exists regarding use of hemoglobin A1c for diagnosis of diabetes for children. .    Mean Plasma Glucose 123 (calc)   eAG (mmol/L) 6.8 (calc)  APTT     Status: None   Collection Time: 01/03/19  1:50 PM  Result Value Ref Range   aPTT 34 24 - 36 seconds    Comment: Performed at [facility], [place] [address] [place], [place] [address]  CBC WITH DIFFERENTIAL     Status: None   Collection Time: 01/03/19  1:50 PM  Result Value Ref Range   WBC 6.4 4.0 - 10.5 K/uL   RBC 4.78 3.87 - 5.11 MIL/uL   Hemoglobin 14.5 12.0 - 15.0 g/dL   HCT 44.9 36.0 - 46.0 %   MCV 93.9 80.0 - 100.0 fL   MCH 30.3 26.0 - 34.0 pg   MCHC 32.3 30.0 - 36.0 g/dL   RDW 13.6 11.5 - 15.5 %   Platelets 311 150 - 400 K/uL   nRBC 0.0 0.0 - 0.2 %   Neutrophils Relative % 47 %   Neutro Abs 3.1 1.7 - 7.7 K/uL   Lymphocytes Relative 38 %   Lymphs Abs 2.4 0.7 - 4.0 K/uL   Monocytes Relative 10 %   Monocytes Absolute 0.7 0.1 - 1.0 K/uL   Eosinophils Relative 3 %   Eosinophils Absolute 0.2 0.0 - 0.5 K/uL   Basophils Relative 1 %   Basophils Absolute 0.0 0.0 - 0.1 K/uL   Immature Granulocytes 1 %   Abs Immature Granulocytes 0.03 0.00 - 0.07 K/uL    Comment:  Performed at [facility], [place] [address] [place], [place] [address]  Comprehensive metabolic panel     Status: None   Collection Time: 01/03/19  1:50 PM  Result Value Ref Range   Sodium 141 135 - 145 mmol/L   Potassium 3.9 3.5 - 5.1 mmol/L   Chloride 104 98 - 111 mmol/L   CO2 28 22 - 32 mmol/L   Glucose, Bld 84 70 - 99 mg/dL  BUN 16 8 - 23 mg/dL   Creatinine, Ser 0.67 0.44 - 1.00 mg/dL   Calcium 9.8 8.9 - 10.3 mg/dL   Total Protein 7.9 6.5 - 8.1 g/dL   Albumin 4.7 3.5 - 5.0 g/dL   AST 30 15 - 41 U/L   ALT 42 0 - 44 U/L   Alkaline Phosphatase 89 38 - 126 U/L   Total Bilirubin 0.6 0.3 - 1.2 mg/dL   GFR calc non Af Amer >60 >60 mL/min   GFR calc Af Amer >60 >60 mL/min   Anion gap 9 5 - 15    Comment: Performed at [facility], [place] [address], [place], [place] [address]  Protime-INR     Status: None   Collection Time: 01/03/19  1:50 PM  Result Value Ref Range   Prothrombin Time 13.2 11.4 - 15.2 seconds   INR 1.0 0.8 - 1.2    Comment: (NOTE) INR goal varies based on device and disease states. Performed at [facility], [place] [address] [place], [place] [address]   Urinalysis, Routine w reflex microscopic     Status: None   Collection Time: 01/03/19  1:50 PM  Result Value Ref Range   Color, Urine YELLOW YELLOW   APPearance CLEAR CLEAR   Specific Gravity, Urine 1.013 1.005 - 1.030   pH 7.0 5.0 - 8.0   Glucose, UA NEGATIVE NEGATIVE mg/dL   Hgb urine dipstick NEGATIVE NEGATIVE   Bilirubin Urine NEGATIVE NEGATIVE   Ketones, ur NEGATIVE NEGATIVE mg/dL   Protein, ur NEGATIVE NEGATIVE mg/dL   Nitrite NEGATIVE NEGATIVE   Leukocytes,Ua NEGATIVE NEGATIVE    Comment: Performed at [place] [address] [place], [place] [address]  Surgical pcr screen     Status: None   Collection Time: 01/03/19  1:50 PM   Specimen: Nasal Mucosa; Nasal Swab  Result Value Ref Range   MRSA, PCR NEGATIVE NEGATIVE    Staphylococcus aureus NEGATIVE NEGATIVE    Comment: (NOTE) The Xpert SA Assay (FDA approved for NASAL specimens in patients 18 years of age and older), is one component of a comprehensive surveillance program. It is not intended to diagnose infection nor to guide or monitor treatment. Performed at [facility], [place] [address] [place], [place] [address]   Hemoglobin A1c     Status: Abnormal   Collection Time: 01/03/19  1:55 PM  Result Value Ref Range   Hgb A1c MFr Bld 5.8 (H) 4.8 - 5.6 %    Comment: (NOTE) Pre diabetes:          5.7%-6.4% Diabetes:              >6.4% Glycemic control for   <7.0% adults with diabetes    Mean Plasma Glucose 119.76 mg/dL    Comment: Performed at [place] [address], [place], [place] [address]  SARS Coronavirus 2 (Performed in [facility] hospital lab)     Status: None   Collection Time: 01/03/19  2:21 PM   Specimen: Nasal Swab  Result Value Ref Range   SARS Coronavirus 2 NEGATIVE NEGATIVE    Comment: (NOTE) SARS-CoV-2 target nucleic acids are NOT DETECTED. The SARS-CoV-2 RNA is generally detectable in upper and lower respiratory specimens during the acute phase of infection. Negative results do not preclude SARS-CoV-2 infection, do not rule out co-infections with other pathogens, and should not be used as the sole basis for treatment or other patient management decisions. Negative results  must be combined with clinical observations, patient history, and epidemiological information. The expected result is Negative. Fact Sheet for Patients: SugarRoll.be Fact Sheet for Healthcare Providers: https://www.woods-mathews.com/ This test is not yet approved or cleared by the [place] FDA and  has been authorized for detection and/or diagnosis of SARS-CoV-2 by FDA under an Emergency Use Authorization (EUA). This EUA will remain  in effect (meaning this test can be used) for  the duration of the COVID-19 declaration under Section 56 4(b)(1) of the Act, 21 U.S.C. section 360bbb-3(b)(1), unless the authorization is terminated or revoked sooner. Performed at [facility], [place] [address], [place], [place] [address]     Estimated body mass index is 31.92 kg/m as calculated from the following:   Height as of 01/03/19: 5\' 5"  (1.651 m).   Weight as of 01/03/19: 87 kg.   Imaging Review Plain radiographs demonstrate severe degenerative joint disease of the left knee(s). The overall alignment ismild varus. The bone quality appears to be fair for age and reported activity level.      Assessment/Plan:  End stage arthritis, left knee   The patient history, physical examination, clinical judgment of the provider and imaging studies are consistent with end stage degenerative joint disease of the left knee(s) and total knee arthroplasty is deemed medically necessary. The treatment options including medical management, injection therapy arthroscopy and arthroplasty were discussed at length. The risks and benefits of total knee arthroplasty were presented and reviewed. The risks due to aseptic loosening, infection, stiffness, patella tracking problems, thromboembolic complications and other imponderables were discussed. The patient acknowledged the explanation, agreed to proceed with the plan and consent was signed. Patient is being admitted for inpatient treatment for surgery, pain control, PT, OT, prophylactic antibiotics, VTE prophylaxis, progressive ambulation and ADL's and discharge planning. The patient is planning to be discharged home with home health services     Patient's anticipated LOS is less than 2 midnights, meeting these requirements: - Younger than 68 - Lives within 1 hour of care - Has a competent adult at home to recover with post-op recover - NO history of  - Chronic pain requiring opiods  - Diabetes  - Coronary Artery Disease  - Heart  failure  - Heart attack  - Stroke  - DVT/VTE  - Cardiac arrhythmia  - Respiratory Failure/COPD  - Renal failure  - Anemia  - Advanced Liver disease

## 2019-01-06 ENCOUNTER — Ambulatory Visit (HOSPITAL_COMMUNITY): Payer: Medicare Other | Admitting: Emergency Medicine

## 2019-01-06 ENCOUNTER — Other Ambulatory Visit: Payer: Self-pay

## 2019-01-06 ENCOUNTER — Encounter (HOSPITAL_COMMUNITY): Payer: Self-pay | Admitting: *Deleted

## 2019-01-06 ENCOUNTER — Ambulatory Visit (HOSPITAL_COMMUNITY): Payer: Medicare Other | Admitting: Certified Registered"

## 2019-01-06 ENCOUNTER — Inpatient Hospital Stay (HOSPITAL_COMMUNITY)
Admission: RE | Admit: 2019-01-06 | Discharge: 2019-01-09 | DRG: 470 | Disposition: A | Discharge status: Home Health | Payer: Medicare Other | Attending: Orthopedic Surgery | Admitting: Orthopedic Surgery

## 2019-01-06 ENCOUNTER — Encounter (HOSPITAL_COMMUNITY)
Admission: RE | Disposition: A | Discharge status: Home Health | Payer: Self-pay | Source: Home / Self Care | Attending: Orthopedic Surgery

## 2019-01-06 DIAGNOSIS — M170 Bilateral primary osteoarthritis of knee: Principal | ICD-10-CM | POA: Diagnosis present

## 2019-01-06 DIAGNOSIS — Z825 Family history of asthma and other chronic lower respiratory diseases: Secondary | ICD-10-CM

## 2019-01-06 DIAGNOSIS — I110 Hypertensive heart disease with heart failure: Secondary | ICD-10-CM | POA: Diagnosis present

## 2019-01-06 DIAGNOSIS — E782 Mixed hyperlipidemia: Secondary | ICD-10-CM | POA: Diagnosis present

## 2019-01-06 DIAGNOSIS — Z96612 Presence of left artificial shoulder joint: Secondary | ICD-10-CM

## 2019-01-06 DIAGNOSIS — Z79899 Other long term (current) drug therapy: Secondary | ICD-10-CM

## 2019-01-06 DIAGNOSIS — F3341 Major depressive disorder, recurrent, in partial remission: Secondary | ICD-10-CM | POA: Diagnosis present

## 2019-01-06 DIAGNOSIS — Z823 Family history of stroke: Secondary | ICD-10-CM

## 2019-01-06 DIAGNOSIS — Z7951 Long term (current) use of inhaled steroids: Secondary | ICD-10-CM

## 2019-01-06 DIAGNOSIS — Z8249 Family history of ischemic heart disease and other diseases of the circulatory system: Secondary | ICD-10-CM

## 2019-01-06 DIAGNOSIS — M1712 Unilateral primary osteoarthritis, left knee: Secondary | ICD-10-CM

## 2019-01-06 DIAGNOSIS — Z7982 Long term (current) use of aspirin: Secondary | ICD-10-CM

## 2019-01-06 DIAGNOSIS — G4733 Obstructive sleep apnea (adult) (pediatric): Secondary | ICD-10-CM | POA: Diagnosis present

## 2019-01-06 DIAGNOSIS — Z90710 Acquired absence of both cervix and uterus: Secondary | ICD-10-CM

## 2019-01-06 DIAGNOSIS — I509 Heart failure, unspecified: Secondary | ICD-10-CM | POA: Diagnosis present

## 2019-01-06 DIAGNOSIS — M797 Fibromyalgia: Secondary | ICD-10-CM | POA: Diagnosis present

## 2019-01-06 DIAGNOSIS — E559 Vitamin D deficiency, unspecified: Secondary | ICD-10-CM | POA: Diagnosis present

## 2019-01-06 DIAGNOSIS — Z809 Family history of malignant neoplasm, unspecified: Secondary | ICD-10-CM

## 2019-01-06 DIAGNOSIS — Z96652 Presence of left artificial knee joint: Secondary | ICD-10-CM

## 2019-01-06 DIAGNOSIS — Z888 Allergy status to other drugs, medicaments and biological substances status: Secondary | ICD-10-CM

## 2019-01-06 HISTORY — PX: TOTAL KNEE ARTHROPLASTY: SHX125

## 2019-01-06 SURGERY — ARTHROPLASTY, KNEE, TOTAL
Anesthesia: Spinal | Site: Knee | Laterality: Left

## 2019-01-06 MED ORDER — PROPOFOL 10 MG/ML IV BOLUS
INTRAVENOUS | Status: DC | PRN
  Administered 2019-01-06: 30 mg via INTRAVENOUS

## 2019-01-06 MED ORDER — PROPOFOL 10 MG/ML IV BOLUS
INTRAVENOUS | Status: AC
  Filled 2019-01-06: qty 20

## 2019-01-06 MED ORDER — CLINDAMYCIN PHOSPHATE 600 MG/50ML IV SOLN
600.0000 mg | Freq: Four times a day (QID) | INTRAVENOUS | Status: AC
  Administered 2019-01-06 (×2): 600 mg via INTRAVENOUS
  Filled 2019-01-06 (×3): qty 50

## 2019-01-06 MED ORDER — TIZANIDINE HCL 2 MG PO TABS: 2.0000 mg | ORAL_TABLET | Freq: Three times a day (TID) | ORAL | 0 refills | Status: DC | PRN

## 2019-01-06 MED ORDER — TRANEXAMIC ACID-NACL 1000-0.7 MG/100ML-% IV SOLN
1000.0000 mg | Freq: Once | INTRAVENOUS | Status: AC
  Administered 2019-01-06: 1000 mg via INTRAVENOUS
  Filled 2019-01-06: qty 100

## 2019-01-06 MED ORDER — SODIUM CHLORIDE (PF) 0.9 % IJ SOLN
INTRAMUSCULAR | Status: AC
  Filled 2019-01-06: qty 50

## 2019-01-06 MED ORDER — ALUM & MAG HYDROXIDE-SIMETH 200-200-20 MG/5ML PO SUSP
30.0000 mL | ORAL | Status: DC | PRN
  Filled 2019-01-06: qty 30

## 2019-01-06 MED ORDER — ASPIRIN EC 325 MG PO TBEC
325.0000 mg | DELAYED_RELEASE_TABLET | Freq: Two times a day (BID) | ORAL | Status: DC
  Administered 2019-01-06 – 2019-01-09 (×6): 325 mg via ORAL
  Filled 2019-01-06 (×6): qty 1

## 2019-01-06 MED ORDER — HYDROMORPHONE HCL 1 MG/ML IJ SOLN: 0.2500 mg | INTRAMUSCULAR | Status: DC | PRN

## 2019-01-06 MED ORDER — CLONIDINE HCL (ANALGESIA) 100 MCG/ML EP SOLN
EPIDURAL | Status: DC | PRN
  Administered 2019-01-06: 80 ug

## 2019-01-06 MED ORDER — MIDAZOLAM HCL 2 MG/2ML IJ SOLN
1.0000 mg | INTRAMUSCULAR | Status: DC
  Administered 2019-01-06: 11:00:00 1 mg via INTRAVENOUS

## 2019-01-06 MED ORDER — ASPIRIN EC 325 MG PO TBEC: 325.0000 mg | DELAYED_RELEASE_TABLET | Freq: Two times a day (BID) | ORAL | 0 refills | Status: DC

## 2019-01-06 MED ORDER — TRANEXAMIC ACID-NACL 1000-0.7 MG/100ML-% IV SOLN
INTRAVENOUS | Status: AC
  Filled 2019-01-06: qty 100

## 2019-01-06 MED ORDER — MAGNESIUM OXIDE 400 (241.3 MG) MG PO TABS
800.0000 mg | ORAL_TABLET | Freq: Two times a day (BID) | ORAL | Status: DC
  Administered 2019-01-07 – 2019-01-09 (×4): 800 mg via ORAL
  Filled 2019-01-06 (×5): qty 2

## 2019-01-06 MED ORDER — BUPIVACAINE-EPINEPHRINE (PF) 0.5% -1:200000 IJ SOLN
INTRAMUSCULAR | Status: AC
  Filled 2019-01-06: qty 30

## 2019-01-06 MED ORDER — POLYETHYLENE GLYCOL 3350 17 G PO PACK
17.0000 g | PACK | Freq: Every day | ORAL | Status: DC | PRN
  Administered 2019-01-09: 17 g via ORAL
  Filled 2019-01-06: qty 1

## 2019-01-06 MED ORDER — ROPIVACAINE HCL 7.5 MG/ML IJ SOLN
INTRAMUSCULAR | Status: DC | PRN
  Administered 2019-01-06: 20 mL via PERINEURAL

## 2019-01-06 MED ORDER — SODIUM CHLORIDE 0.9 % IV SOLN
INTRAVENOUS | Status: DC
  Administered 2019-01-06 – 2019-01-08 (×3): via INTRAVENOUS

## 2019-01-06 MED ORDER — GABAPENTIN 300 MG PO CAPS
300.0000 mg | ORAL_CAPSULE | Freq: Two times a day (BID) | ORAL | Status: DC
  Administered 2019-01-06 – 2019-01-09 (×6): 300 mg via ORAL
  Filled 2019-01-06 (×6): qty 1

## 2019-01-06 MED ORDER — 0.9 % SODIUM CHLORIDE (POUR BTL) OPTIME
TOPICAL | Status: DC | PRN
  Administered 2019-01-06: 1000 mL

## 2019-01-06 MED ORDER — POVIDONE-IODINE 10 % EX SWAB
2.0000 "application " | Freq: Once | CUTANEOUS | Status: AC
  Administered 2019-01-06: 2 via TOPICAL

## 2019-01-06 MED ORDER — OXYCODONE-ACETAMINOPHEN 5-325 MG PO TABS: 1.0000 | ORAL_TABLET | Freq: Four times a day (QID) | ORAL | 0 refills | Status: DC | PRN

## 2019-01-06 MED ORDER — PROPOFOL 500 MG/50ML IV EMUL: INTRAVENOUS | Status: DC | PRN

## 2019-01-06 MED ORDER — FENTANYL CITRATE (PF) 100 MCG/2ML IJ SOLN
50.0000 ug | INTRAMUSCULAR | Status: DC
  Administered 2019-01-06: 11:00:00 50 ug via INTRAVENOUS

## 2019-01-06 MED ORDER — FENTANYL CITRATE (PF) 100 MCG/2ML IJ SOLN
INTRAMUSCULAR | Status: AC
  Administered 2019-01-06: 50 ug via INTRAVENOUS
  Filled 2019-01-06: qty 2

## 2019-01-06 MED ORDER — TRANEXAMIC ACID-NACL 1000-0.7 MG/100ML-% IV SOLN
1000.0000 mg | INTRAVENOUS | Status: AC
  Administered 2019-01-06: 1000 mg via INTRAVENOUS
  Filled 2019-01-06: qty 100

## 2019-01-06 MED ORDER — HYDROCHLOROTHIAZIDE 25 MG PO TABS
25.0000 mg | ORAL_TABLET | Freq: Every day | ORAL | Status: DC
  Administered 2019-01-08 – 2019-01-09 (×2): 25 mg via ORAL
  Filled 2019-01-06 (×2): qty 1

## 2019-01-06 MED ORDER — METHOCARBAMOL 500 MG IVPB - SIMPLE MED
500.0000 mg | Freq: Four times a day (QID) | INTRAVENOUS | Status: DC | PRN
  Filled 2019-01-06 (×2): qty 50

## 2019-01-06 MED ORDER — CLONAZEPAM 1 MG PO TABS: 1.0000 mg | ORAL_TABLET | Freq: Every evening | ORAL | Status: DC | PRN

## 2019-01-06 MED ORDER — SODIUM CHLORIDE 0.9% FLUSH
INTRAVENOUS | Status: DC | PRN
  Administered 2019-01-06: 50 mL

## 2019-01-06 MED ORDER — DIPHENHYDRAMINE HCL 12.5 MG/5ML PO ELIX: 12.5000 mg | ORAL_SOLUTION | ORAL | Status: DC | PRN

## 2019-01-06 MED ORDER — ONDANSETRON HCL 4 MG/2ML IJ SOLN
4.0000 mg | Freq: Four times a day (QID) | INTRAMUSCULAR | Status: DC | PRN
  Administered 2019-01-07: 4 mg via INTRAVENOUS
  Filled 2019-01-06: qty 2

## 2019-01-06 MED ORDER — BUPIVACAINE IN DEXTROSE 0.75-8.25 % IT SOLN
INTRATHECAL | Status: DC | PRN
  Administered 2019-01-06: 1.6 mL via INTRATHECAL

## 2019-01-06 MED ORDER — BUPIVACAINE LIPOSOME 1.3 % IJ SUSP
INTRAMUSCULAR | Status: DC | PRN
  Administered 2019-01-06: 20 mL

## 2019-01-06 MED ORDER — PROMETHAZINE HCL 25 MG/ML IJ SOLN: 6.2500 mg | INTRAMUSCULAR | Status: DC | PRN

## 2019-01-06 MED ORDER — LAMOTRIGINE 25 MG PO TABS
150.0000 mg | ORAL_TABLET | Freq: Every day | ORAL | Status: DC
  Administered 2019-01-06 – 2019-01-08 (×3): 150 mg via ORAL
  Filled 2019-01-06 (×3): qty 1

## 2019-01-06 MED ORDER — ONDANSETRON HCL 4 MG/2ML IJ SOLN
INTRAMUSCULAR | Status: DC | PRN
  Administered 2019-01-06: 4 mg via INTRAVENOUS

## 2019-01-06 MED ORDER — MAGNESIUM CITRATE PO SOLN
1.0000 | Freq: Once | ORAL | Status: AC | PRN
  Administered 2019-01-09: 1 via ORAL
  Filled 2019-01-06: qty 296

## 2019-01-06 MED ORDER — DEXAMETHASONE SODIUM PHOSPHATE 10 MG/ML IJ SOLN
10.0000 mg | Freq: Two times a day (BID) | INTRAMUSCULAR | Status: AC
  Administered 2019-01-07 – 2019-01-08 (×2): 10 mg via INTRAVENOUS
  Filled 2019-01-06 (×2): qty 1

## 2019-01-06 MED ORDER — MIDAZOLAM HCL 2 MG/2ML IJ SOLN
INTRAMUSCULAR | Status: DC | PRN
  Administered 2019-01-06 (×2): 1 mg via INTRAVENOUS

## 2019-01-06 MED ORDER — OXYCODONE HCL 5 MG PO TABS
5.0000 mg | ORAL_TABLET | ORAL | Status: DC | PRN
  Administered 2019-01-06 – 2019-01-07 (×3): 5 mg via ORAL
  Administered 2019-01-08: 10 mg via ORAL
  Filled 2019-01-06 (×3): qty 1
  Filled 2019-01-06: qty 2

## 2019-01-06 MED ORDER — QUETIAPINE FUMARATE 50 MG PO TABS
100.0000 mg | ORAL_TABLET | Freq: Three times a day (TID) | ORAL | Status: DC
  Administered 2019-01-06 – 2019-01-09 (×10): 100 mg via ORAL
  Filled 2019-01-06 (×10): qty 2

## 2019-01-06 MED ORDER — DOCUSATE SODIUM 100 MG PO CAPS
100.0000 mg | ORAL_CAPSULE | Freq: Two times a day (BID) | ORAL | Status: DC
  Administered 2019-01-07 – 2019-01-09 (×4): 100 mg via ORAL
  Filled 2019-01-06 (×5): qty 1

## 2019-01-06 MED ORDER — GLYCOPYRROLATE 0.2 MG/ML IJ SOLN
INTRAMUSCULAR | Status: DC | PRN
  Administered 2019-01-06: 0.1 mg via INTRAVENOUS

## 2019-01-06 MED ORDER — PROPOFOL 500 MG/50ML IV EMUL
INTRAVENOUS | Status: DC | PRN
  Administered 2019-01-06: 135 ug/kg/min via INTRAVENOUS

## 2019-01-06 MED ORDER — LACTATED RINGERS IV SOLN
INTRAVENOUS | Status: DC
  Administered 2019-01-06 (×2): via INTRAVENOUS

## 2019-01-06 MED ORDER — DEXAMETHASONE SODIUM PHOSPHATE 10 MG/ML IJ SOLN
INTRAMUSCULAR | Status: DC | PRN
  Administered 2019-01-06: 8 mg via INTRAVENOUS

## 2019-01-06 MED ORDER — BISACODYL 5 MG PO TBEC
5.0000 mg | DELAYED_RELEASE_TABLET | Freq: Every day | ORAL | Status: DC | PRN
  Administered 2019-01-08: 5 mg via ORAL
  Filled 2019-01-06 (×2): qty 1

## 2019-01-06 MED ORDER — ONDANSETRON HCL 4 MG PO TABS
4.0000 mg | ORAL_TABLET | Freq: Four times a day (QID) | ORAL | Status: DC | PRN
  Administered 2019-01-08 (×2): 4 mg via ORAL
  Filled 2019-01-06 (×2): qty 1

## 2019-01-06 MED ORDER — MEPERIDINE HCL 50 MG/ML IJ SOLN: 6.2500 mg | INTRAMUSCULAR | Status: DC | PRN

## 2019-01-06 MED ORDER — HYDROMORPHONE HCL 1 MG/ML IJ SOLN
0.5000 mg | INTRAMUSCULAR | Status: DC | PRN
  Administered 2019-01-06: 0.5 mg via INTRAVENOUS
  Filled 2019-01-06: qty 1

## 2019-01-06 MED ORDER — METHOCARBAMOL 500 MG PO TABS
500.0000 mg | ORAL_TABLET | Freq: Four times a day (QID) | ORAL | Status: DC | PRN
  Administered 2019-01-06 – 2019-01-08 (×3): 500 mg via ORAL
  Filled 2019-01-06 (×3): qty 1

## 2019-01-06 MED ORDER — MAGNESIUM 500 MG PO TABS: 1000.0000 mg | ORAL_TABLET | Freq: Three times a day (TID) | ORAL | Status: DC

## 2019-01-06 MED ORDER — BUPIVACAINE-EPINEPHRINE 0.5% -1:200000 IJ SOLN
INTRAMUSCULAR | Status: DC | PRN
  Administered 2019-01-06: 30 mL

## 2019-01-06 MED ORDER — ACETAMINOPHEN 325 MG PO TABS: 325.0000 mg | ORAL_TABLET | Freq: Four times a day (QID) | ORAL | Status: DC | PRN

## 2019-01-06 MED ORDER — MIDAZOLAM HCL 2 MG/2ML IJ SOLN
INTRAMUSCULAR | Status: AC
  Administered 2019-01-06: 1 mg via INTRAVENOUS
  Filled 2019-01-06: qty 2

## 2019-01-06 MED ORDER — MIDAZOLAM HCL 2 MG/2ML IJ SOLN
INTRAMUSCULAR | Status: AC
  Filled 2019-01-06: qty 2

## 2019-01-06 MED ORDER — SODIUM CHLORIDE 0.9 % IR SOLN
Status: DC | PRN
  Administered 2019-01-06: 1000 mL

## 2019-01-06 MED ORDER — VANCOMYCIN HCL IN DEXTROSE 1-5 GM/200ML-% IV SOLN: 1000.0000 mg | INTRAVENOUS | Status: AC

## 2019-01-06 MED ORDER — CHLORHEXIDINE GLUCONATE 4 % EX LIQD: 60.0000 mL | Freq: Once | CUTANEOUS | Status: DC

## 2019-01-06 MED ORDER — VANCOMYCIN HCL IN DEXTROSE 1-5 GM/200ML-% IV SOLN
INTRAVENOUS | Status: AC
  Administered 2019-01-06: 1000 mg via INTRAVENOUS
  Filled 2019-01-06: qty 200

## 2019-01-06 MED ORDER — DOCUSATE SODIUM 100 MG PO CAPS: 100.0000 mg | ORAL_CAPSULE | Freq: Two times a day (BID) | ORAL | 0 refills | Status: DC

## 2019-01-06 SURGICAL SUPPLY — 54 items
ATTUNE PSFEM LTSZ4 NARCEM KNEE (Femur) ×2 IMPLANT
ATTUNE PSRP INSR SZ4 6 KNEE (Insert) ×1 IMPLANT
ATTUNE PSRP INSR SZ4 6MM KNEE (Insert) ×1 IMPLANT
BAG ZIPLOCK 12X15 (MISCELLANEOUS) ×3 IMPLANT
BASEPLATE TIBIAL ROTATING SZ 4 (Knees) ×2 IMPLANT
BENZOIN TINCTURE PRP APPL 2/3 (GAUZE/BANDAGES/DRESSINGS) ×3 IMPLANT
BLADE SAGITTAL 25.0X1.19X90 (BLADE) ×2 IMPLANT
BLADE SAGITTAL 25.0X1.19X90MM (BLADE) ×1
BLADE SAW SGTL 11.0X1.19X90.0M (BLADE) ×3 IMPLANT
BLADE SURG SZ10 CARB STEEL (BLADE) ×6 IMPLANT
BNDG ELASTIC 6X5.8 VLCR STR LF (GAUZE/BANDAGES/DRESSINGS) ×4 IMPLANT
BOOTIES KNEE HIGH SLOAN (MISCELLANEOUS) ×3 IMPLANT
BOWL SMART MIX CTS (DISPOSABLE) ×3 IMPLANT
CEMENT HV SMART SET (Cement) ×6 IMPLANT
CLOSURE STERI-STRIP 1/2X4 (GAUZE/BANDAGES/DRESSINGS) ×1
CLOSURE WOUND 1/2 X4 (GAUZE/BANDAGES/DRESSINGS)
CLSR STERI-STRIP ANTIMIC 1/2X4 (GAUZE/BANDAGES/DRESSINGS) ×1 IMPLANT
COVER SURGICAL LIGHT HANDLE (MISCELLANEOUS) ×3 IMPLANT
COVER WAND RF STERILE (DRAPES) IMPLANT
CUFF TOURN SGL QUICK 34 (TOURNIQUET CUFF) ×2
CUFF TRNQT CYL 34X4.125X (TOURNIQUET CUFF) ×1 IMPLANT
DECANTER SPIKE VIAL GLASS SM (MISCELLANEOUS) ×6 IMPLANT
DRAPE U-SHAPE 47X51 STRL (DRAPES) ×3 IMPLANT
DRSG AQUACEL AG ADV 3.5X10 (GAUZE/BANDAGES/DRESSINGS) ×3 IMPLANT
DURAPREP 26ML APPLICATOR (WOUND CARE) ×3 IMPLANT
ELECT REM PT RETURN 15FT ADLT (MISCELLANEOUS) ×3 IMPLANT
GLOVE BIOGEL PI IND STRL 8 (GLOVE) ×2 IMPLANT
GLOVE BIOGEL PI INDICATOR 8 (GLOVE) ×4
GLOVE ECLIPSE 7.5 STRL STRAW (GLOVE) ×6 IMPLANT
GOWN STRL REUS W/TWL XL LVL3 (GOWN DISPOSABLE) ×6 IMPLANT
HANDPIECE INTERPULSE COAX TIP (DISPOSABLE) ×2
HOLDER FOLEY CATH W/STRAP (MISCELLANEOUS) IMPLANT
HOOD PEEL AWAY FLYTE STAYCOOL (MISCELLANEOUS) ×9 IMPLANT
KIT TURNOVER KIT A (KITS) IMPLANT
MANIFOLD NEPTUNE II (INSTRUMENTS) ×3 IMPLANT
NEEDLE HYPO 22GX1.5 SAFETY (NEEDLE) ×3 IMPLANT
NS IRRIG 1000ML POUR BTL (IV SOLUTION) ×3 IMPLANT
PACK ICE MAXI GEL EZY WRAP (MISCELLANEOUS) ×3 IMPLANT
PACK TOTAL KNEE CUSTOM (KITS) ×3 IMPLANT
PADDING CAST COTTON 6X4 STRL (CAST SUPPLIES) ×3 IMPLANT
PATELLA MEDIAL ATTUN 35MM KNEE (Knees) ×2 IMPLANT
PIN STEINMAN FIXATION KNEE (PIN) ×2 IMPLANT
PIN THREADED HEADED SIGMA (PIN) ×2 IMPLANT
PROTECTOR NERVE ULNAR (MISCELLANEOUS) ×3 IMPLANT
SET HNDPC FAN SPRY TIP SCT (DISPOSABLE) ×1 IMPLANT
STRIP CLOSURE SKIN 1/2X4 (GAUZE/BANDAGES/DRESSINGS) IMPLANT
SUT MNCRL AB 3-0 PS2 18 (SUTURE) ×3 IMPLANT
SUT VIC AB 0 CT1 36 (SUTURE) ×3 IMPLANT
SUT VIC AB 1 CT1 36 (SUTURE) ×6 IMPLANT
SYR CONTROL 10ML LL (SYRINGE) ×6 IMPLANT
TRAY FOLEY MTR SLVR 16FR STAT (SET/KITS/TRAYS/PACK) ×3 IMPLANT
WATER STERILE IRR 1000ML POUR (IV SOLUTION) ×6 IMPLANT
WRAP KNEE MAXI GEL POST OP (GAUZE/BANDAGES/DRESSINGS) ×2 IMPLANT
YANKAUER SUCT BULB TIP 10FT TU (MISCELLANEOUS) ×3 IMPLANT

## 2019-01-06 NOTE — Anesthesia Procedure Notes (Signed)
Anesthesia Regional Block: Adductor canal block   Pre-Anesthetic Checklist: ,, timeout performed, Correct Patient, Correct Site, Correct Laterality, Correct Procedure, Correct Position, site marked, Risks and benefits discussed,  Surgical consent,  Pre-op evaluation,  At surgeon's request and post-op pain management  Laterality: Left  Prep: chloraprep       Needles:  Injection technique: Single-shot  Needle Type: Stimiplex     Needle Length: 9cm  Needle Gauge: 21     Additional Needles:   Procedures:,,,, ultrasound used (permanent image in chart),,,,  Narrative:  Start time: 01/06/2019 10:40 AM End time: 01/06/2019 10:44 AM Injection made incrementally with aspirations every 5 mL.  Performed by: Personally  Anesthesiologist: Nolon Nations, MD  Additional Notes: BP cuff, EKG monitors applied. Sedation begun. Artery and nerve location verified with U/S and anesthetic injected incrementally, slowly, and after negative aspirations under direct u/s guidance. Good fascial /perineural spread. Tolerated well.

## 2019-01-06 NOTE — Anesthesia Procedure Notes (Signed)
Spinal  Patient location during procedure: OR Start time: 01/06/2019 11:18 AM End time: 01/06/2019 11:22 AM Staffing Resident/CRNA: Niel Hummer, CRNA Performed: resident/CRNA  Preanesthetic Checklist Completed: patient identified, surgical consent, pre-op evaluation, IV checked, risks and benefits discussed and monitors and equipment checked Spinal Block Patient position: sitting Prep: DuraPrep Patient monitoring: heart rate, continuous pulse ox and blood pressure Approach: midline Location: L3-4 Injection technique: single-shot Needle Needle type: Pencan  Needle gauge: 24 G

## 2019-01-06 NOTE — Anesthesia Postprocedure Evaluation (Signed)
Anesthesia Post Note  Patient: [name]  Procedure(s) Performed: TOTAL KNEE ARTHROPLASTY (Left Knee)     Patient location during evaluation: PACU Anesthesia Type: Spinal Level of consciousness: awake and alert Pain management: pain level controlled Vital Signs Assessment: post-procedure vital signs reviewed and stable Respiratory status: spontaneous breathing Cardiovascular status: stable Anesthetic complications: no    Last Vitals:  Vitals:   01/06/19 1528 01/06/19 1633  BP: (!) 148/84 138/85  Pulse: 62 85  Resp: 14 16  Temp: 36.5 C 36.8 C  SpO2: 98% 96%    Last Pain:  Vitals:   01/06/19 1633  TempSrc: Oral  PainSc:                  Nolon Nations

## 2019-01-06 NOTE — Discharge Instructions (Signed)

## 2019-01-06 NOTE — Progress Notes (Signed)
AssistedDr. Lissa Hoard with left, ultrasound guided, adductor canal block. Side rails up, monitors on throughout procedure. See vital signs in flow sheet. Tolerated Procedure well.

## 2019-01-06 NOTE — Evaluation (Signed)
Physical Therapy Evaluation Patient Details Name: [name] MRN: [id number] DOB: 09/25/1955 Today's Date: 01/06/2019   History of Present Illness  63 yo female s/p L TKR on 01/06/19. PMH includes OA, OSA, fibromyalgia, depression, HTN, HLD, R TKR  Clinical Impression  Pt presents with L knee pain, decreased L knee ROM, difficulty performing bed mobility and transfers, increased time and effort to perform mobility tasks, and decreased activity tolerance limited by pain. Pt to benefit from acute PT to address deficits. Pt ambulated hallway distance with RW with min guard assist, verbal cuing for form and safety provided. Pt educated on ankle pumps (20/hour) to perform this afternoon/evening to increase circulation, to pt's tolerance and limited by pain. PT to progress mobility as tolerated, and will continue to follow acutely.        Follow Up Recommendations Follow surgeon's recommendation for DC plan and follow-up therapies;Supervision for mobility/OOB    Equipment Recommendations  None recommended by PT    Recommendations for Other Services       Precautions / Restrictions Precautions Precautions: Fall Restrictions Weight Bearing Restrictions: No LLE Weight Bearing: Weight bearing as tolerated      Mobility  Bed Mobility Overal bed mobility: Needs Assistance Bed Mobility: Supine to Sit     Supine to sit: HOB elevated;Min assist     General bed mobility comments: Min assist for LLE management. Increased time to perform  Transfers Overall transfer level: Needs assistance Equipment used: Rolling walker (2 wheeled) Transfers: Sit to/from Stand Sit to Stand: Min assist;From elevated surface         General transfer comment: Sit to stand x2, once from bed and once from toilet. Min assist for power up, steadying, increased time to rise. Verbal cuing for hand placement.  Ambulation/Gait Ambulation/Gait assistance: Min guard Gait Distance (Feet): 60 Feet Assistive  device: Rolling walker (2 wheeled) Gait Pattern/deviations: Step-to pattern;Decreased step length - right;Decreased step length - left;Decreased weight shift to left;Antalgic Gait velocity: decr   General Gait Details: Min guard for safety. Verbal cuing for upright posture, sequencing, placement in RW. Pt with increasingly antalgic gait with further ambulation distance.  Stairs            Wheelchair Mobility    Modified Rankin (Stroke Patients Only)       Balance Overall balance assessment: Mild deficits observed, not formally tested                                           Pertinent Vitals/Pain Pain Assessment: 0-10 Pain Score: 3  Pain Location: L knee Pain Descriptors / Indicators: Sore Pain Intervention(s): Limited activity within patient's tolerance;Monitored during session;Premedicated before session;Repositioned    Home Living Family/patient expects to be discharged to:: Private residence Living Arrangements: Spouse/significant other Available Help at Discharge: Family;Available 24 hours/day Type of Home: House Home Access: Stairs to enter Entrance Stairs-Rails: Right;Left;Can reach both Entrance Stairs-Number of Steps: 3 Home Layout: One level Home Equipment: Walker - 2 wheels;Cane - single point      Prior Function Level of Independence: Independent with assistive device(s)         Comments: pt reports occasionally using cane for ambulation due to pain PTA     Hand Dominance   Dominant Hand: Right    Extremity/Trunk Assessment   Upper Extremity Assessment Upper Extremity Assessment: Overall WFL for tasks assessed    Lower  Extremity Assessment Lower Extremity Assessment: Overall WFL for tasks assessed;LLE deficits/detail LLE Deficits / Details: post-surgical LLE weakness; able to perform ankle pump, weak quad set, heel slide to 80*, SLR with quad lag 10* LLE Sensation: WNL    Cervical / Trunk Assessment Cervical / Trunk  Assessment: Normal  Communication   Communication: No difficulties  Cognition Arousal/Alertness: Awake/alert Behavior During Therapy: WFL for tasks assessed/performed Overall Cognitive Status: Within Functional Limits for tasks assessed                                        General Comments      Exercises     Assessment/Plan    PT Assessment Patient needs continued PT services  PT Problem List Decreased strength;Decreased range of motion;Decreased balance;Decreased activity tolerance;Decreased knowledge of use of DME;Pain;Decreased mobility;Decreased safety awareness       PT Treatment Interventions DME instruction;Therapeutic activities;Gait training;Therapeutic exercise;Patient/family education;Balance training;Stair training;Functional mobility training    PT Goals (Current goals can be found in the Care Plan section)  Acute Rehab PT Goals PT Goal Formulation: With patient Time For Goal Achievement: 01/13/19 Potential to Achieve Goals: Good    Frequency 7X/week   Barriers to discharge        Co-evaluation               AM-PAC PT "6 Clicks" Mobility  Outcome Measure Help needed turning from your back to your side while in a flat bed without using bedrails?: A Little Help needed moving from lying on your back to sitting on the side of a flat bed without using bedrails?: A Little Help needed moving to and from a bed to a chair (including a wheelchair)?: A Little Help needed standing up from a chair using your arms (e.g., wheelchair or bedside chair)?: A Little Help needed to walk in hospital room?: A Little Help needed climbing 3-5 steps with a railing? : A Little 6 Click Score: 18    End of Session Equipment Utilized During Treatment: Gait belt Activity Tolerance: Patient tolerated treatment well Patient left: in bed;with bed alarm set;with call bell/phone within reach;with SCD's reapplied Nurse Communication: Mobility status PT Visit  Diagnosis: Other abnormalities of gait and mobility (R26.89);Difficulty in walking, not elsewhere classified (R26.2)    Time: 3953-2023 PT Time Calculation (min) (ACUTE ONLY): 20 min   Charges:   PT Evaluation $PT Eval Low Complexity: 1 Low         Julien Girt, PT Acute Rehabilitation Services Pager [phone]  Office [phone]  Roxine Caddy D Elonda Husky 01/06/2019, 7:22 PM

## 2019-01-06 NOTE — Transfer of Care (Signed)
Immediate Anesthesia Transfer of Care Note  Patient: [name]  Procedure(s) Performed: TOTAL KNEE ARTHROPLASTY (Left Knee)  Patient Location: PACU  Anesthesia Type:MAC  Level of Consciousness: awake, alert  and oriented  Airway & Oxygen Therapy: Patient Spontanous Breathing and Patient connected to face mask oxygen  Post-op Assessment: Report given to RN and Post -op Vital signs reviewed and stable  Post vital signs: Reviewed and stable  Last Vitals:  Vitals Value Taken Time  BP 121/64 01/06/19 1321  Temp    Pulse 92 01/06/19 1323  Resp 16 01/06/19 1323  SpO2 99 % 01/06/19 1323  Vitals shown include unvalidated device data.  Last Pain:  Vitals:   01/06/19 0918  TempSrc: Oral         Complications: No apparent anesthesia complications

## 2019-01-06 NOTE — Progress Notes (Signed)
Patient refuses CPAP for tonight. RT encouraged patient to call if she changes her mind.

## 2019-01-06 NOTE — Op Note (Signed)
PATIENT ID:      [name]  MRN:     [id number] DOB/AGE:    1955/11/07 / 63 y.o.       OPERATIVE REPORT    DATE OF PROCEDURE:  01/06/2019       PREOPERATIVE DIAGNOSIS:   OSTEOARTHRITIS LEFT KNEE      Estimated body mass index is 31.92 kg/m as calculated from the following:   Height as of 01/03/19: 5\' 5"  (1.651 m).   Weight as of 01/03/19: 87 kg.                                                        POSTOPERATIVE DIAGNOSIS:   OSTEOARTHRITIS LEFT KNEE                                                                      PROCEDURE:  Procedure(s): TOTAL KNEE ARTHROPLASTY Using DepuyAttune RP implants #4N Femur, #4Tibia, 6 mm Attune RP bearing, 35 Patella     SURGEON: Alta Corning    ASSISTANT:   Gaspar Skeeters PA-C   (Present and scrubbed throughout the case, critical for assistance with exposure, retraction, instrumentation, and closure.)         ANESTHESIA: spinal, 20cc Exparel, 50cc 0.25% Marcaine  EBL: minimal cc  FLUID REPLACEMENT: per anesthesia cc crystaloid  Tourniquet Time: None  Drains: None  Tranexamic Acid: 1gm IV, 2gm topical  Exparel: 266mg    COMPLICATIONS:  None         INDICATIONS FOR PROCEDURE: The patient has  OSTEOARTHRITIS LEFT KNEE, mild varus deformities, XR shows bone on bone arthritis, lateral subluxation of tibia. Patient has failed all conservative measures including anti-inflammatory medicines, narcotics, attempts at exercise and weight loss, cortisone injections and viscosupplementation.  Risks and benefits of surgery have been discussed, questions answered.   DESCRIPTION OF PROCEDURE: The patient identified by armband, received  IV antibiotics, in the holding area at [facility]. Patient taken to the operating room, appropriate anesthetic monitors were attached, and spinal anesthesia was  induced. IV Tranexamic acid was given.Tourniquet applied high to the operative thigh. Lateral post and foot positioner applied to the table, the lower extremity was  then prepped and draped in usual sterile fashion from the toes to the tourniquet. Time-out procedure was performed. The skin and subcutaneous tissue along the incision was injected with 20 cc of a mixture of Exparel and Marcaine solution, using a 20-gauge by 1-1/2 inch needle. We began the operation, with the knee flexed 130 degrees, by making the anterior midline incision starting at handbreadth above the patella going over the patella 1 cm medial to and 4 cm distal to the tibial tubercle. Small bleeders in the skin and the subcutaneous tissue identified and cauterized. Transverse retinaculum was incised and reflected medially and a medial parapatellar arthrotomy was accomplished. the patella was everted and theprepatellar fat pad resected. The superficial medial collateral ligament was then elevated from anterior to posterior along the proximal flare of the tibia and anterior half of the menisci resected. The knee was hyperflexed exposing bone on  bone arthritis. Peripheral and notch osteophytes as well as the cruciate ligaments were then resected. We continued to work our way around posteriorly along the proximal tibia, and externally rotated the tibia subluxing it out from underneath the femur. A McHale retractor was placed through the notch and a lateral Hohmann retractor placed, and we then drilled through the proximal tibia in line with the axis of the tibia followed by an intramedullary guide rod and 2-degree posterior slope cutting guide. The tibial cutting guide, 4 degree posterior sloped, was pinned into place allowing resection of 2 mm of bone medially and 10 mm of bone laterally. Satisfied with the tibial resection, we then entered the distal femur 2 mm anterior to the PCL origin with the intramedullary guide rod and applied the distal femoral cutting guide set at 9 mm, with 5 degrees of valgus. This was pinned along the epicondylar axis. At this point, the distal femoral cut was accomplished without  difficulty. We then sized for a #4N femoral component and pinned the guide in 3 degrees of external rotation. The chamfer cutting guide was pinned into place. The anterior, posterior, and chamfer cuts were accomplished without difficulty followed by the Attune RP box cutting guide and the box cut. We also removed posterior osteophytes from the posterior femoral condyles. The posterior capsule was injected with Exparel solution. The knee was brought into full extension. We checked our extension gap and fit a 6 mm bearing. Distracting in extension with a lamina spreader,  bleeders in the posterior capsule, Posterior medial and posterior lateral right down and cauterized.  The transexamic acid-soaked sponge was then placed in the gap of the knee and extension. The knee was flexed 30. The posterior patella cut was accomplished with the 9.5 mm Attune cutting guide, sized for a 68mm dome, and the fixation pegs drilled.The knee was then once again hyperflexed exposing the proximal tibia. We sized for a # 4 tibial base plate, applied the smokestack and the conical reamer followed by the the Delta fin keel punch. We then hammered into place the Attune RP trial femoral component, drilled the lugs, inserted a  6 mm trial bearing, trial patellar button, and took the knee through range of motion from 0-130 degrees. Medial and lateral ligamentous stability was checked. No thumb pressure was required for patellar Tracking. The tourniquet was less than 1 hr. All trial components were removed, mating surfaces irrigated with pulse lavage, and dried with suction and sponges. 10 cc of the Exparel solution was applied to the cancellus bone of the patella distal femur and proximal tibia.  After waiting 30 seconds, the bony surfaces were again, dried with sponges. A double batch of DePuy HV cement was mixed and applied to all bony metallic mating surfaces except for the posterior condyles of the femur itself. In order, we hammered into  place the tibial tray and removed excess cement, the femoral component and removed excess cement. The final Attune RP bearing was inserted, and the knee brought to full extension with compression. The patellar button was clamped into place, and excess cement removed. The knee was held at 30 flexion with compression, while the cement cured. The wound was irrigated out with normal saline solution pulse lavage. The rest of the Exparel was injected into the parapatellar arthrotomy, subcutaneous tissues, and periosteal tissues. The parapatellar arthrotomy was closed with running #1 Vicryl suture. The subcutaneous tissue with 0 and 2-0 undyed Vicryl suture, and the skin with running 3-0 SQ vicryl. An Aquacil  and Ace wrap were applied. The patient was taken to recovery [id number] without difficulty.   Alta Corning 01/06/2019, 3:40 PM

## 2019-01-06 NOTE — Anesthesia Preprocedure Evaluation (Signed)
Anesthesia Evaluation  Patient identified by MRN, date of birth, ID band Patient awake    Reviewed: Allergy & Precautions, H&P , NPO status , Patient's Chart, lab work & pertinent test results, reviewed documented beta blocker date and time   History of Anesthesia Complications Negative for: history of anesthetic complications  Airway Mallampati: II  TM Distance: >3 FB Neck ROM: Full    Dental  (+) Teeth Intact, Dental Advisory Given   Pulmonary sleep apnea and Continuous Positive Airway Pressure Ventilation ,    Pulmonary exam normal breath sounds clear to auscultation       Cardiovascular hypertension, Pt. on medications Normal cardiovascular exam Rhythm:Regular Rate:Normal     Neuro/Psych negative neurological ROS     GI/Hepatic negative GI ROS, Neg liver ROS,   Endo/Other  negative endocrine ROS  Renal/GU negative Renal ROS     Musculoskeletal  (+) Arthritis , Osteoarthritis,    Abdominal (+) + obese,   Peds  Hematology   Anesthesia Other Findings   Reproductive/Obstetrics                             Anesthesia Physical  Anesthesia Plan  ASA: III  Anesthesia Plan: Spinal   Post-op Pain Management:  Regional for Post-op pain   Induction: Intravenous  PONV Risk Score and Plan: 3 and Ondansetron, Dexamethasone, Midazolam and Treatment may vary due to age or medical condition  Airway Management Planned: Natural Airway  Additional Equipment:   Intra-op Plan:   Post-operative Plan:   Informed Consent: I have reviewed the patients History and Physical, chart, labs and discussed the procedure including the risks, benefits and alternatives for the proposed anesthesia with the patient or authorized representative who has indicated his/her understanding and acceptance.     Dental advisory given  Plan Discussed with: CRNA  Anesthesia Plan Comments:        Anesthesia  Quick Evaluation

## 2019-01-06 NOTE — Anesthesia Procedure Notes (Addendum)
Procedure Name: MAC Date/Time: 01/06/2019 11:17 AM Performed by: Niel Hummer, CRNA Pre-anesthesia Checklist: Patient identified, Emergency Drugs available, Suction available and Patient being monitored Patient Re-evaluated:Patient Re-evaluated prior to induction Oxygen Delivery Method: Simple face mask

## 2019-01-06 NOTE — Interval H&P Note (Signed)
History and Physical Interval Note:  01/06/2019 10:07 AM  [name]  has presented today for surgery, with the diagnosis of OSTEOARTHRITIS LEFT KNEE.  The various methods of treatment have been discussed with the patient and family. After consideration of risks, benefits and other options for treatment, the patient has consented to  Procedure(s): TOTAL KNEE ARTHROPLASTY (Left) as a surgical intervention.  The patient's history has been reviewed, patient examined, no change in status, stable for surgery.  I have reviewed the patient's chart and labs.  Questions were answered to the patient's satisfaction.     Alta Corning

## 2019-01-07 DIAGNOSIS — Z823 Family history of stroke: Secondary | ICD-10-CM | POA: Diagnosis not present

## 2019-01-07 DIAGNOSIS — E559 Vitamin D deficiency, unspecified: Secondary | ICD-10-CM | POA: Diagnosis present

## 2019-01-07 DIAGNOSIS — Z79899 Other long term (current) drug therapy: Secondary | ICD-10-CM | POA: Diagnosis not present

## 2019-01-07 DIAGNOSIS — Z888 Allergy status to other drugs, medicaments and biological substances status: Secondary | ICD-10-CM | POA: Diagnosis not present

## 2019-01-07 DIAGNOSIS — Z96652 Presence of left artificial knee joint: Secondary | ICD-10-CM

## 2019-01-07 DIAGNOSIS — Z7951 Long term (current) use of inhaled steroids: Secondary | ICD-10-CM | POA: Diagnosis not present

## 2019-01-07 DIAGNOSIS — G4733 Obstructive sleep apnea (adult) (pediatric): Secondary | ICD-10-CM | POA: Diagnosis present

## 2019-01-07 DIAGNOSIS — I110 Hypertensive heart disease with heart failure: Secondary | ICD-10-CM | POA: Diagnosis present

## 2019-01-07 DIAGNOSIS — Z8249 Family history of ischemic heart disease and other diseases of the circulatory system: Secondary | ICD-10-CM | POA: Diagnosis not present

## 2019-01-07 DIAGNOSIS — M170 Bilateral primary osteoarthritis of knee: Secondary | ICD-10-CM | POA: Diagnosis present

## 2019-01-07 DIAGNOSIS — I509 Heart failure, unspecified: Secondary | ICD-10-CM | POA: Diagnosis present

## 2019-01-07 DIAGNOSIS — Z825 Family history of asthma and other chronic lower respiratory diseases: Secondary | ICD-10-CM | POA: Diagnosis not present

## 2019-01-07 DIAGNOSIS — Z96612 Presence of left artificial shoulder joint: Secondary | ICD-10-CM

## 2019-01-07 DIAGNOSIS — E782 Mixed hyperlipidemia: Secondary | ICD-10-CM | POA: Diagnosis present

## 2019-01-07 DIAGNOSIS — Z7982 Long term (current) use of aspirin: Secondary | ICD-10-CM | POA: Diagnosis not present

## 2019-01-07 DIAGNOSIS — F3341 Major depressive disorder, recurrent, in partial remission: Secondary | ICD-10-CM | POA: Diagnosis present

## 2019-01-07 DIAGNOSIS — Z809 Family history of malignant neoplasm, unspecified: Secondary | ICD-10-CM | POA: Diagnosis not present

## 2019-01-07 DIAGNOSIS — Z90710 Acquired absence of both cervix and uterus: Secondary | ICD-10-CM | POA: Diagnosis not present

## 2019-01-07 DIAGNOSIS — M797 Fibromyalgia: Secondary | ICD-10-CM | POA: Diagnosis present

## 2019-01-07 HISTORY — DX: Presence of left artificial shoulder joint: Z96.612

## 2019-01-07 HISTORY — DX: Presence of left artificial knee joint: Z96.652

## 2019-01-07 LAB — GLUCOSE, CAPILLARY: Glucose-Capillary: 156 mg/dL — ABNORMAL HIGH (ref 70–99)

## 2019-01-07 LAB — CBC
HCT: 39.2 % (ref 36.0–46.0)
Hemoglobin: 12.7 g/dL (ref 12.0–15.0)
MCH: 30.7 pg (ref 26.0–34.0)
MCHC: 32.4 g/dL (ref 30.0–36.0)
MCV: 94.7 fL (ref 80.0–100.0)
Platelets: 290 10*3/uL (ref 150–400)
RBC: 4.14 MIL/uL (ref 3.87–5.11)
RDW: 13.8 % (ref 11.5–15.5)
WBC: 13.4 10*3/uL — ABNORMAL HIGH (ref 4.0–10.5)
nRBC: 0 % (ref 0.0–0.2)

## 2019-01-07 MED ORDER — METOCLOPRAMIDE HCL 5 MG/ML IJ SOLN
10.0000 mg | Freq: Four times a day (QID) | INTRAMUSCULAR | Status: DC
  Administered 2019-01-07 – 2019-01-09 (×7): 10 mg via INTRAVENOUS
  Filled 2019-01-07 (×8): qty 2

## 2019-01-07 MED ORDER — PROMETHAZINE HCL 25 MG PO TABS
12.5000 mg | ORAL_TABLET | Freq: Four times a day (QID) | ORAL | Status: DC | PRN
  Administered 2019-01-07: 12.5 mg via ORAL
  Filled 2019-01-07: qty 1

## 2019-01-07 MED ORDER — TRAMADOL HCL 50 MG PO TABS
50.0000 mg | ORAL_TABLET | Freq: Four times a day (QID) | ORAL | Status: DC
  Administered 2019-01-07 (×2): 100 mg via ORAL
  Administered 2019-01-08: 50 mg via ORAL
  Administered 2019-01-08: 100 mg via ORAL
  Administered 2019-01-08 (×2): 50 mg via ORAL
  Administered 2019-01-08: 100 mg via ORAL
  Administered 2019-01-09 (×3): 50 mg via ORAL
  Filled 2019-01-07 (×2): qty 1
  Filled 2019-01-07: qty 2
  Filled 2019-01-07 (×2): qty 1
  Filled 2019-01-07 (×3): qty 2
  Filled 2019-01-07 (×2): qty 1

## 2019-01-07 MED ORDER — METOCLOPRAMIDE HCL 5 MG PO TABS: 5.0000 mg | ORAL_TABLET | Freq: Four times a day (QID) | ORAL | Status: DC | PRN

## 2019-01-07 NOTE — Progress Notes (Signed)
   PATIENT ID: [name]   1 Day Post-Op Procedure(s) (LRB): TOTAL KNEE ARTHROPLASTY (Left)  Subjective: Patient miserable with nausea and vomiting since 2am. Given dilaudid and oxycodone prior to this. Reports history of n and v with codeine related meds. Reports doing well on tramadol in the past. L knee pain around surgical site and tourniquet site.   Objective:  Vitals:   01/07/19 0652 01/07/19 0739  BP: 134/77 134/72  Pulse: 84 86  Resp: 20 18  Temp: 97.7 F (36.5 C)   SpO2: 96%      L knee dressing c/d/i Wiggles toes, distally NVI  Labs:  Recent Labs    01/07/19 0425  HGB 12.7   Recent Labs    01/07/19 0425  WBC 13.4*  RBC 4.14  HCT 39.2  PLT 290  No results for input(s): NA, K, CL, CO2, BUN, CREATININE, GLUCOSE, CALCIUM in the last 72 hours.  Assessment and Plan: 1 day s/p L TKA Nausea and vomiting likely related to pain rx. Minimize as much as possible and added order for tramadol and reglan IV/po.  Will likely need to stay another day until she is more comfortable and can participate in PT CPM/PT Increased fluids to 125 ml/hr, encourage oral hydration  VTE proph: asa, scds

## 2019-01-07 NOTE — TOC Initial Note (Signed)
Transition of Care [facility]) - Initial/Assessment Note    Patient Details  Name: [name] MRN: [id number] Date of Birth: 08-11-1955  [facility] of [facility]) CM/SW Contact:    Joaquin Courts, RN Phone Number: 01/07/2019, 11:45 AM  Clinical Narrative:         CM spoke with patient at bedside. Patient reports she was initially thinking she may be able to go to outpatient therapy but now realizes she wont be able to and requests home health. [facility] [place]. Patient reports she has rolling walker and 3-in-1 at home.        Expected Discharge Plan: [place] Barriers to Discharge: Continued Medical Work up   Patient Goals and CMS Choice Patient states their goals for this hospitalization and ongoing recovery are:: to go home with home health CMS Medicare.gov Compare Post Acute Care list provided to:: Patient Choice offered to / list presented to : Patient  Expected Discharge Plan and Services Expected Discharge Plan: [place]   Discharge Planning Services: CM Consult   Living arrangements for the past 2 months: Single Family Home                 DME Arranged: N/A DME Agency: NA       HH Arranged: PT HH Agency: [facility] [employer] Date [place]: 01/07/19 Time [place]: 1143 Representative spoke with at [place]: Alwyn Ren  Prior Living Arrangements/Services Living arrangements for the past 2 months: [place] with:: Spouse Patient language and need for interpreter reviewed:: Yes Do you feel safe going back to the place where you live?: Yes      Need for Family Participation in Patient Care: Yes (Comment) Care giver support system in place?: Yes (comment)   Criminal Activity/Legal Involvement Pertinent to Current Situation/Hospitalization: No - Comment as needed  Activities of Daily Living Home Assistive Devices/Equipment: Walker (specify type),  Cane (specify quad or straight), Crutches, Eyeglasses, Built-in shower seat, Grab bars in shower, Raised toilet seat with rails, Hand-held shower hose ADL Screening (condition at time of admission) Patient's cognitive ability adequate to safely complete daily activities?: Yes Is the patient deaf or have difficulty hearing?: No Does the patient have difficulty seeing, even when wearing glasses/contacts?: No Does the patient have difficulty concentrating, remembering, or making decisions?: No Patient able to express need for assistance with ADLs?: Yes Does the patient have difficulty dressing or bathing?: No Independently performs ADLs?: Yes (appropriate for developmental age) Does the patient have difficulty walking or climbing stairs?: Yes Weakness of Legs: Left Weakness of Arms/Hands: None  Permission Sought/Granted                  Emotional Assessment Appearance:: Appears stated age Attitude/Demeanor/Rapport: Engaged Affect (typically observed): Accepting Orientation: : Oriented to Place, Oriented to  Time, Oriented to Situation, Oriented to Self   Psych Involvement: No (comment)  Admission diagnosis:  OSTEOARTHRITIS LEFT KNEE Patient Active Problem List   Diagnosis Date Noted  . Primary osteoarthritis of left knee 01/06/2019  . Osteoarthritis of both knees 03/20/2015  . Encounter for Medicare annual wellness exam 03/20/2015  . OSA on CPAP 08/15/2014  . Medication management 01/31/2014  . Fibromyalgia 01/31/2014  . Depression, major, recurrent, in partial remission ([place]) 01/31/2014  . Obesity (BMI 30.0-34.9) 01/31/2014  . Essential hypertension 08/09/2013  . Mixed hyperlipidemia 08/09/2013  . Other abnormal glucose (prediabetes) 08/09/2013  .  Vitamin D deficiency 08/09/2013   PCP:  Unk Pinto, MD Pharmacy:   [facility] [place], [address]  [id number] Phone: [phone] Fax: [phone]     Social Determinants of Health (SDOH) Interventions    Readmission Risk Interventions No flowsheet data found.

## 2019-01-07 NOTE — Progress Notes (Signed)
Physical Therapy Treatment Patient Details Name: [name] MRN: [id number] DOB: [birth year] Today's Date: 01/07/2019    History of Present Illness 63 yo female s/p L TKR on 01/06/19. PMH includes OA, OSA, fibromyalgia, depression, HTN, HLD, R TKR    PT Comments    Pt agreeable to bed therex.  OOB deferred - pt with extreme nausea all night and this morning and with ++ pain with WB on recent move to [facility] with nursing.  Will follow up and progress in am.   Follow Up Recommendations  Follow surgeon's recommendation for DC plan and follow-up therapies;Supervision for mobility/OOB     Equipment Recommendations  None recommended by PT    Recommendations for Other Services       Precautions / Restrictions Precautions Precautions: Fall Restrictions Weight Bearing Restrictions: No LLE Weight Bearing: Weight bearing as tolerated    Mobility  Bed Mobility                  Transfers                    Ambulation/Gait                 Stairs             Wheelchair Mobility    Modified Rankin (Stroke Patients Only)       Balance                                            Cognition Arousal/Alertness: Awake/alert Behavior During Therapy: WFL for tasks assessed/performed Overall Cognitive Status: Within Functional Limits for tasks assessed                                        Exercises Total Joint Exercises Ankle Circles/Pumps: AROM;Both;20 reps;Supine Quad Sets: AROM;Both;10 reps;Supine Heel Slides: AAROM;Left;15 reps;Supine Straight Leg Raises: AAROM;Left;10 reps;Supine Goniometric ROM: AAROM L knee -8- 45    General Comments        Pertinent Vitals/Pain Pain Assessment: 0-10 Pain Score: 8  Pain Location: L knee 8/10 with WB and 3/10 at session end Pain Descriptors / Indicators: Aching;Sore Pain Intervention(s): Limited activity within patient's tolerance;Monitored during  session;Premedicated before session;Ice applied    Home Living                      Prior Function            PT Goals (current goals can now be found in the care plan section) Acute Rehab PT Goals Patient Stated Goal: Regain IND PT Goal Formulation: With patient Time For Goal Achievement: 01/13/19 Potential to Achieve Goals: Good Progress towards PT goals: Not progressing toward goals - comment(extreme nausea and pain this date)    Frequency    7X/week      PT Plan Current plan remains appropriate    Co-evaluation              AM-PAC PT "6 Clicks" Mobility   Outcome Measure  Help needed turning from your back to your side while in a flat bed without using bedrails?: A Lot Help needed moving from lying on your back to sitting on the side of a flat bed without using bedrails?: A Lot Help needed moving to  and from a bed to a chair (including a wheelchair)?: A Lot Help needed standing up from a chair using your arms (e.g., wheelchair or bedside chair)?: A Lot Help needed to walk in hospital room?: A Lot Help needed climbing 3-5 steps with a railing? : Total 6 Click Score: 11    End of Session   Activity Tolerance: Patient limited by fatigue;Patient limited by pain Patient left: in bed;with bed alarm set;with call bell/phone within reach;with SCD's reapplied Nurse Communication: Mobility status PT Visit Diagnosis: Other abnormalities of gait and mobility (R26.89);Difficulty in walking, not elsewhere classified (R26.2)     Time: 8242-3536 PT Time Calculation (min) (ACUTE ONLY): 17 min  Charges:  $Therapeutic Exercise: 8-22 mins                     [place] Pager [phone] Office [phone]    [facility] 01/07/2019, 4:43 PM

## 2019-01-07 NOTE — Progress Notes (Signed)
    Home health agencies that serve [id number].        [place] Quality of Patient Care Rating Patient Survey Summary Rating  [facility] [phone] 4 out of 5 stars 4 out of [place] [phone] 3 out of 5 stars 4 out of [place] [phone]) [phone] 4  out of 5 stars 3 out of [place] [phone] 4  out of 5 stars 4 out of [place] [phone] 4 out of 5 stars 4 out of [place] [phone] 4 out of 5 stars 4 out of 5 stars  [facility] [place] [phone] 3  out of 5 stars 4 out of [place] [phone] 2  out of 5 stars 3 out of [place] [phone] 3 out of 5 stars 4 out of 5 stars  [facility] (910) [phone] 4 out of 5 stars Not [place] [phone] 3 out of 5 stars 4 out of 5 stars  [facility] [phone] 3  out of 5 stars 3 out of [place] [phone] 3  out of 5 stars 4 out of [place] [phone] 3  out of 5 stars 3 out of [place] [phone] 3  out of 5 stars Not [place] [phone] 4  out of 5 stars 3 out of [place] number Footnote as displayed on [place]  1 This agency provides services under a federal waiver program to non-traditional, chronic long term population.  2 This agency provides services to a special needs population.  3 Not Available.  4 The number of patient episodes for this measure is too small to report.  5 This measure currently does not have data or provider has been certified/recertified for less than 6 months.  6  The national average for this measure is not provided because of state-to-state differences in data collection.  7 Medicare is not displaying rates for this measure for any home health agency, because of an issue with the data.  8 There were problems with the data and they are being corrected.  9 Zero, or very few, patients met the survey's rules for inclusion. The scores shown, if any, reflect a very small number of surveys and may not accurately tell how an agency is doing.  10 Survey results are based on less than 12 months of data.  11 Fewer than 70 patients completed the survey. Use the scores shown, if any, with caution as the number of surveys may be too low to accurately tell how an agency is doing.  12 No survey results are available for this period.  13 Data suppressed by CMS for one or more quarters.

## 2019-01-08 LAB — CBC
HCT: 36.1 % (ref 36.0–46.0)
Hemoglobin: 11.3 g/dL — ABNORMAL LOW (ref 12.0–15.0)
MCH: 29.8 pg (ref 26.0–34.0)
MCHC: 31.3 g/dL (ref 30.0–36.0)
MCV: 95.3 fL (ref 80.0–100.0)
Platelets: 267 10*3/uL (ref 150–400)
RBC: 3.79 MIL/uL — ABNORMAL LOW (ref 3.87–5.11)
RDW: 14 % (ref 11.5–15.5)
WBC: 10.2 10*3/uL (ref 4.0–10.5)
nRBC: 0 % (ref 0.0–0.2)

## 2019-01-08 MED ORDER — ONDANSETRON HCL 4 MG PO TABS: 4.0000 mg | ORAL_TABLET | Freq: Four times a day (QID) | ORAL | 0 refills | Status: DC | PRN

## 2019-01-08 MED ORDER — OXYCODONE-ACETAMINOPHEN 5-325 MG PO TABS: 1.0000 | ORAL_TABLET | Freq: Three times a day (TID) | ORAL | 0 refills | Status: DC | PRN

## 2019-01-08 MED ORDER — TRAMADOL HCL 50 MG PO TABS: 50.0000 mg | ORAL_TABLET | Freq: Four times a day (QID) | ORAL | 0 refills | Status: DC

## 2019-01-08 NOTE — Progress Notes (Signed)
Physical Therapy Treatment Patient Details Name: [name] MRN: [id number] DOB: 1955-09-07 Today's Date: 01/08/2019    History of Present Illness 63 yo female s/p L TKR on 01/06/19. PMH includes OA, OSA, fibromyalgia, depression, HTN, HLD, R TKR    PT Comments    Pt motivated and progressing well with mobility including increased distance ambulating in hall and up to bathroom including standing at sink to perform hand hygiene.   Follow Up Recommendations  Follow surgeon's recommendation for DC plan and follow-up therapies;Supervision for mobility/OOB     Equipment Recommendations  None recommended by PT    Recommendations for Other Services       Precautions / Restrictions Precautions Precautions: Fall Restrictions Weight Bearing Restrictions: No LLE Weight Bearing: Weight bearing as tolerated    Mobility  Bed Mobility Overal bed mobility: Needs Assistance Bed Mobility: Supine to Sit;Sit to Supine     Supine to sit: Min assist Sit to supine: Min assist   General bed mobility comments: Min assist for LLE management  Transfers Overall transfer level: Needs assistance Equipment used: Rolling walker (2 wheeled) Transfers: Sit to/from Stand Sit to Stand: Min guard         General transfer comment: Sit to stand x2, once from bed and once from toilet. Cues for use of UEs   Ambulation/Gait Ambulation/Gait assistance: Min guard Gait Distance (Feet): 160 Feet(and 15' twice to/from bathroom) Assistive device: Rolling walker (2 wheeled) Gait Pattern/deviations: Decreased step length - right;Decreased step length - left;Decreased weight shift to left;Antalgic;Step-to pattern;Step-through pattern Gait velocity: decr   General Gait Details: Min guard for safety. Verbal cuing for upright posture, sequencing, placement in RW.    Stairs             Wheelchair Mobility    Modified Rankin (Stroke Patients Only)       Balance Overall balance assessment:  Mild deficits observed, not formally tested                                          Cognition Arousal/Alertness: Awake/alert Behavior During Therapy: WFL for tasks assessed/performed Overall Cognitive Status: Within Functional Limits for tasks assessed                                        Exercises      General Comments        Pertinent Vitals/Pain Pain Assessment: 0-10 Pain Score: 6  Pain Location: L knee Pain Descriptors / Indicators: Aching;Sore Pain Intervention(s): Limited activity within patient's tolerance;Monitored during session;Premedicated before session;Ice applied    Home Living                      Prior Function            PT Goals (current goals can now be found in the care plan section) Acute Rehab PT Goals Patient Stated Goal: Regain IND PT Goal Formulation: With patient Time For Goal Achievement: 01/13/19 Potential to Achieve Goals: Good Progress towards PT goals: Progressing toward goals    Frequency    7X/week      PT Plan Current plan remains appropriate    Co-evaluation              AM-PAC PT "6 Clicks" Mobility   Outcome  Measure  Help needed turning from your back to your side while in a flat bed without using bedrails?: A Little Help needed moving from lying on your back to sitting on the side of a flat bed without using bedrails?: A Little Help needed moving to and from a bed to a chair (including a wheelchair)?: A Little Help needed standing up from a chair using your arms (e.g., wheelchair or bedside chair)?: A Little Help needed to walk in hospital room?: A Little Help needed climbing 3-5 steps with a railing? : A Little 6 Click Score: 18    End of Session Equipment Utilized During Treatment: Gait belt Activity Tolerance: Patient tolerated treatment well;Patient limited by fatigue Patient left: in bed;with call bell/phone within reach;with bed alarm set;with nursing/sitter  in room Nurse Communication: Mobility status PT Visit Diagnosis: Difficulty in walking, not elsewhere classified (R26.2)     Time: 1856-3149 PT Time Calculation (min) (ACUTE ONLY): 26 min  Charges:  $Gait Training: 8-22 mins $Therapeutic Activity: 8-22 mins                     [place] Pager [phone] Office [phone]    [name] 01/08/2019, 3:16 PM

## 2019-01-08 NOTE — Progress Notes (Signed)
   PATIENT ID: [name]   2 Days Post-Op Procedure(s) (LRB): TOTAL KNEE ARTHROPLASTY (Left)  Subjective: Doing much better today, min nausea. Too sore to get up with PT yesterday since limiting narcotic meds. Hoping to get up today, nervous about d/c today since she is still in sig pain and weak.  Objective:  Vitals:   01/07/19 2118 01/08/19 0443  BP: (!) 148/79 (!) 145/84  Pulse: (!) 107 99  Resp: 20 16  Temp: 98.2 F (36.8 C) 98 F (36.7 C)  SpO2: 92% 94%     L knee dressing c/d/i Wiggles toes, distally NVI  Labs:  Recent Labs    01/07/19 0425 01/08/19 0344  HGB 12.7 11.3*   Recent Labs    01/07/19 0425 01/08/19 0344  WBC 13.4* 10.2  RBC 4.14 3.79*  HCT 39.2 36.1  PLT 290 267  No results for input(s): NA, K, CL, CO2, BUN, CREATININE, GLUCOSE, CALCIUM in the last 72 hours.  Assessment and Plan: 2 days s/p L TKA Nausea and vomiting improved w zofran, switched oxy/dilaudid to tramadol, will change d/c meds Up with PT If pain controlled and cleared by PT d/c today, orders in Call if not cleared by PT and d/c tomorrow   VTE proph: asa, scds

## 2019-01-08 NOTE — Plan of Care (Signed)
  Problem: Clinical Measurements: Goal: Will remain free from infection Outcome: Progressing   Problem: Activity: Goal: Risk for activity intolerance will decrease Outcome: Progressing   Problem: Elimination: Goal: Will not experience complications related to bowel motility Outcome: Progressing Note: Laxative and stool softener given. See MAR Goal: Will not experience complications related to urinary retention Outcome: Progressing   Problem: Pain Managment: Goal: General experience of comfort will improve Outcome: Progressing   Problem: Safety: Goal: Ability to remain free from injury will improve Outcome: Progressing   Problem: Activity: Goal: Ability to avoid complications of mobility impairment will improve Outcome: Progressing Goal: Range of joint motion will improve Outcome: Progressing   Problem: Pain Management: Goal: Pain level will decrease with appropriate interventions Outcome: Progressing

## 2019-01-08 NOTE — Progress Notes (Signed)
Pt stable at this time. She did well overall with physical therapy though it was limited due to weakness and fatigue. rn will inform pa and d/c to be cancelled until tomorrow. This plan was already discussed with pt this am if she did not feel ready to d/c today.

## 2019-01-08 NOTE — Progress Notes (Signed)
Pt continues to get very sleepy from oxycodone for pain. She states it also makes her feel funny in the head. Pt stable overall. rn encouraging her to rest overall. No needs or complications at this time.

## 2019-01-08 NOTE — Progress Notes (Signed)
Physical Therapy Treatment Patient Details Name: [name] MRN: [id number] DOB: [birth year] Today's Date: 01/08/2019    History of Present Illness 63 yo female s/p L TKR on 01/06/19. PMH includes OA, OSA, fibromyalgia, depression, HTN, HLD, R TKR    PT Comments    Pt motivated and with improved control of pain and nausea this am.  Pt ambulated short distance in hall and performed therex program with assist.   Follow Up Recommendations  Follow surgeon's recommendation for DC plan and follow-up therapies;Supervision for mobility/OOB     Equipment Recommendations  None recommended by PT    Recommendations for Other Services       Precautions / Restrictions Precautions Precautions: Fall Restrictions Weight Bearing Restrictions: No LLE Weight Bearing: Weight bearing as tolerated    Mobility  Bed Mobility Overal bed mobility: Needs Assistance Bed Mobility: Supine to Sit     Supine to sit: Min assist     General bed mobility comments: Min assist for LLE management. Increased time to perform  Transfers Overall transfer level: Needs assistance Equipment used: Rolling walker (2 wheeled) Transfers: Sit to/from Stand Sit to Stand: Min assist;From elevated surface         General transfer comment: Sit to stand x2, once from bed and once from toilet. Min assist for power up, steadying, increased time to rise. Verbal cuing for hand placement.  Ambulation/Gait Ambulation/Gait assistance: Min guard Gait Distance (Feet): 60 Feet Assistive device: Rolling walker (2 wheeled) Gait Pattern/deviations: Step-to pattern;Decreased step length - right;Decreased step length - left;Decreased weight shift to left;Antalgic Gait velocity: decr   General Gait Details: Min guard for safety. Verbal cuing for upright posture, sequencing, placement in RW. Pt with increasingly antalgic gait with further ambulation distance.   Stairs             Wheelchair Mobility    Modified  Rankin (Stroke Patients Only)       Balance Overall balance assessment: Mild deficits observed, not formally tested                                          Cognition Arousal/Alertness: Awake/alert Behavior During Therapy: WFL for tasks assessed/performed Overall Cognitive Status: Within Functional Limits for tasks assessed                                        Exercises Total Joint Exercises Ankle Circles/Pumps: AROM;Both;20 reps;Supine Quad Sets: AROM;Both;10 reps;Supine Heel Slides: AAROM;Left;15 reps;Supine Straight Leg Raises: AAROM;Left;10 reps;Supine Goniometric ROM: AAROM L knee -8 - 45    General Comments        Pertinent Vitals/Pain Pain Assessment: 0-10 Pain Score: 6  Pain Location: L knee Pain Descriptors / Indicators: Aching;Sore Pain Intervention(s): Limited activity within patient's tolerance;Monitored during session;Premedicated before session;Utilized relaxation techniques;Ice applied    Home Living                      Prior Function            PT Goals (current goals can now be found in the care plan section) Acute Rehab PT Goals Patient Stated Goal: Regain IND PT Goal Formulation: With patient Time For Goal Achievement: 01/13/19 Potential to Achieve Goals: Good Progress towards PT goals: Progressing toward goals  Frequency    7X/week      PT Plan Current plan remains appropriate    Co-evaluation              AM-PAC PT "6 Clicks" Mobility   Outcome Measure  Help needed turning from your back to your side while in a flat bed without using bedrails?: A Little Help needed moving from lying on your back to sitting on the side of a flat bed without using bedrails?: A Little Help needed moving to and from a bed to a chair (including a wheelchair)?: A Little Help needed standing up from a chair using your arms (e.g., wheelchair or bedside chair)?: A Little Help needed to walk in hospital  room?: A Little Help needed climbing 3-5 steps with a railing? : A Lot 6 Click Score: 17    End of Session Equipment Utilized During Treatment: Gait belt Activity Tolerance: Patient tolerated treatment well;Patient limited by pain Patient left: in chair;with call bell/phone within reach;with chair alarm set Nurse Communication: Mobility status PT Visit Diagnosis: Other abnormalities of gait and mobility (R26.89);Difficulty in walking, not elsewhere classified (R26.2)     Time: 1004-1030 PT Time Calculation (min) (ACUTE ONLY): 26 min  Charges:  $Gait Training: 8-22 mins $Therapeutic Exercise: 8-22 mins                     [place] Pager [phone] Office [phone]    Obdulio Mash 01/08/2019, 11:57 AM

## 2019-01-09 LAB — CBC
HCT: 35.5 % — ABNORMAL LOW (ref 36.0–46.0)
Hemoglobin: 11.7 g/dL — ABNORMAL LOW (ref 12.0–15.0)
MCH: 31.3 pg (ref 26.0–34.0)
MCHC: 33 g/dL (ref 30.0–36.0)
MCV: 94.9 fL (ref 80.0–100.0)
Platelets: 288 10*3/uL (ref 150–400)
RBC: 3.74 MIL/uL — ABNORMAL LOW (ref 3.87–5.11)
RDW: 14.2 % (ref 11.5–15.5)
WBC: 9.9 10*3/uL (ref 4.0–10.5)
nRBC: 0 % (ref 0.0–0.2)

## 2019-01-09 MED ORDER — SIMETHICONE 80 MG PO CHEW
80.0000 mg | CHEWABLE_TABLET | Freq: Four times a day (QID) | ORAL | Status: DC | PRN
  Administered 2019-01-09: 80 mg via ORAL
  Filled 2019-01-09: qty 1

## 2019-01-09 MED ORDER — BISACODYL 10 MG RE SUPP
10.0000 mg | Freq: Once | RECTAL | Status: AC
  Administered 2019-01-09: 10 mg via RECTAL
  Filled 2019-01-09: qty 1

## 2019-01-09 NOTE — Progress Notes (Signed)
Subjective: 3 Days Post-Op Procedure(s) (LRB): TOTAL KNEE ARTHROPLASTY (Left) Patient reports pain as moderate.  The patient has significant nausea over the weekend and her medication had to be adjusted.  She made slow progress with physical therapy.  This morning she is doing much better.  She is taking by mouth, voiding okay, and feels that she is ready to go home.  Objective: Vital signs in last 24 hours: Temp:  [97.9 F (36.6 C)-98.1 F (36.7 C)] 98.1 F (36.7 C) (07/20 0505) Pulse Rate:  [82-99] 99 (07/20 0505) Resp:  [16-18] 18 (07/20 0505) BP: (132-141)/(67-81) 132/79 (07/20 0505) SpO2:  [94 %] 94 % (07/20 0505)  Intake/Output from previous day: 07/19 0701 - 07/20 0700 In: 1603.9 [P.O.:1480; I.V.:123.9] Out: -  Intake/Output this shift: No intake/output data recorded.  Recent Labs    01/07/19 0425 01/08/19 0344 01/09/19 0746  HGB 12.7 11.3* 11.7*   Recent Labs    01/08/19 0344 01/09/19 0746  WBC 10.2 9.9  RBC 3.79* 3.74*  HCT 36.1 35.5*  PLT 267 288   No results for input(s): NA, K, CL, CO2, BUN, CREATININE, GLUCOSE, CALCIUM in the last 72 hours. No results for input(s): LABPT, INR in the last 72 hours. Left knee exam: Neurovascular intact Sensation intact distally Intact pulses distally Dorsiflexion/Plantar flexion intact Incision: dressing C/D/I Compartment soft   Assessment/Plan: 3 Days Post-Op Procedure(s) (LRB): TOTAL KNEE ARTHROPLASTY (Left) Plan: Weight-bear as tolerated on left. Aspirin 325 mg twice daily with food for DVT prophylaxis x1 month postop. Up with therapy Discharge home with home health We will use tramadol orally for pain at home. Follow-up with Dr. Berenice Primas in 2 weeks.   Anticipated LOS equal to or greater than 2 midnights due to - Age 62 and older with one or more of the following:  - Obesity  - Expected need for hospital services (PT, OT, Nursing) required for safe  discharge  - Anticipated need for postoperative skilled  nursing care or inpatient rehab  - Active co-morbidities: Severe nausea postop with pain. OR   - Unanticipated findings during/Post Surgery: Slow post-op progression: GI, pain control, mobility  - Patient is a high risk of re-admission due to: Slow progress with physical therapy, severe postop nausea, not felt to be safe to be discharged home until date of discharge.    Erlene Senters 01/09/2019, 8:21 AM

## 2019-01-09 NOTE — Progress Notes (Signed)
Physical Therapy Treatment Patient Details Name: [name] MRN: [id number] DOB: [birth year] Today's Date: 01/09/2019    History of Present Illness 63 yo female s/p L TKR on 01/06/19. PMH includes OA, OSA, fibromyalgia, depression, HTN, HLD, R TKR    PT Comments    POD # 3 am session Assisted with amb a decreased distance due to increased c/o nausea, feeling bloated and really wanting to have a BM.   Follow Up Recommendations  Follow surgeon's recommendation for DC plan and follow-up therapies;Supervision for mobility/OOB     Equipment Recommendations  None recommended by PT    Recommendations for Other Services       Precautions / Restrictions Precautions Precautions: Fall Restrictions Weight Bearing Restrictions: No LLE Weight Bearing: Weight bearing as tolerated    Mobility  Bed Mobility               General bed mobility comments: OOB in recliner  Transfers Overall transfer level: Needs assistance Equipment used: Rolling walker (2 wheeled) Transfers: Sit to/from Stand Sit to Stand: Min guard         General transfer comment: increased time due to feeling "bad"  Ambulation/Gait Ambulation/Gait assistance: Min guard;Supervision Gait Distance (Feet): 45 Feet Assistive device: Rolling walker (2 wheeled) Gait Pattern/deviations: Decreased step length - right;Decreased step length - left;Decreased weight shift to left;Antalgic;Step-to pattern;Step-through pattern Gait velocity: decreased   General Gait Details: decreased amb distance due to increased c/o nausea/bloating.   Stairs             Wheelchair Mobility    Modified Rankin (Stroke Patients Only)       Balance                                            Cognition Arousal/Alertness: Awake/alert Behavior During Therapy: WFL for tasks assessed/performed Overall Cognitive Status: Within Functional Limits for tasks assessed                                 General Comments: feels "bad" nausea and bloating      Exercises      General Comments        Pertinent Vitals/Pain      Home Living                      Prior Function            PT Goals (current goals can now be found in the care plan section) Progress towards PT goals: Progressing toward goals    Frequency    7X/week      PT Plan Current plan remains appropriate    Co-evaluation              AM-PAC PT "6 Clicks" Mobility   Outcome Measure  Help needed turning from your back to your side while in a flat bed without using bedrails?: A Little Help needed moving from lying on your back to sitting on the side of a flat bed without using bedrails?: A Little Help needed moving to and from a bed to a chair (including a wheelchair)?: A Little Help needed standing up from a chair using your arms (e.g., wheelchair or bedside chair)?: A Little Help needed to walk in hospital room?: A Little Help needed climbing 3-5 steps  with a railing? : A Little 6 Click Score: 18    End of Session Equipment Utilized During Treatment: Gait belt Activity Tolerance: Patient tolerated treatment well;Patient limited by fatigue Patient left: Other (comment)(in bathroom instructed to pull cord) Nurse Communication: Mobility status PT Visit Diagnosis: Difficulty in walking, not elsewhere classified (R26.2)     Time: 1188-6773 PT Time Calculation (min) (ACUTE ONLY): 28 min  Charges:  $Gait Training: 8-22 mins $Therapeutic Activity: 8-22 mins                     Rica Koyanagi  PTA Acute  Rehabilitation Services Pager      [phone] Office      [phone]

## 2019-01-09 NOTE — Progress Notes (Signed)
Patient  Still does not want to wear a cpap while she is here. Encouraged patient to call if she changes her mind.

## 2019-01-09 NOTE — Discharge Summary (Signed)
Patient ID: [name] MRN: [id number] DOB/AGE: [birth year] 63 y.o.  Admit date: 01/06/2019 Discharge date: 01/09/2019  Admission Diagnoses:  Principal Problem:   Primary osteoarthritis of left knee Active Problems:   Status post total shoulder arthroplasty, left   S/P total knee arthroplasty, left   Discharge Diagnoses:  Same  Past Medical History:  Diagnosis Date  . Arthritis   . Depression   . Elevated hemoglobin A1c   . Fibromyalgia   . Hypertension   . Other abnormal glucose 08/09/2013  . Sleep apnea    cpap   > 4 yrs  last sleep study  . Unspecified essential hypertension 08/09/2013  . Unspecified vitamin D deficiency 08/09/2013  . Vitamin D deficiency     Surgeries: Procedure(s): Left TOTAL KNEE ARTHROPLASTY on 01/06/2019   Consultants:   Discharged Condition: Improved  Hospital Course: [name] is an 63 y.o. female who was admitted 01/06/2019 for operative treatment ofPrimary osteoarthritis of left knee. Patient has severe unremitting pain that affects sleep, daily activities, and work/hobbies. After pre-op clearance the patient was taken to the operating room on 01/06/2019 and underwent  Procedure(s): Left TOTAL KNEE ARTHROPLASTY.    Patient was given perioperative antibiotics:  Anti-infectives (From admission, onward)   Start     Dose/Rate Route Frequency Ordered Stop   01/06/19 1600  clindamycin (CLEOCIN) IVPB 600 mg     600 mg 100 mL/hr over 30 Minutes Intravenous Every 6 hours 01/06/19 1430 01/06/19 2343   01/06/19 0915  vancomycin (VANCOCIN) IVPB 1000 mg/200 mL premix     1,000 mg 200 mL/hr over 60 Minutes Intravenous On call to O.R. 01/06/19 0902 01/06/19 1538   01/06/19 0909  vancomycin (VANCOCIN) 1-5 GM/200ML-% IVPB    Note to Pharmacy: Charmayne Sheer   : cabinet override      01/06/19 0909 01/06/19 1145       Patient was given sequential compression devices, early ambulation, and chemoprophylaxis to prevent DVT she has significant  postoperative nausea and vomiting which slowed down her therapy.  She had slow mobility with physical therapy.  Her medication was switched to oral tramadol which was more effective for her and she made slow progress with PT on the date of discharge she was ambulating relatively independently and had done well with physical therapy she is ready for discharge home.  She is afebrile and her vital signs are stable.  Her left knee dressing was clean and dry at the time of discharge and her left calf was soft..  Patient benefited maximally from hospital stay and there were no complications.    Recent vital signs:  Patient Vitals for the past 24 hrs:  BP Temp Temp src Pulse Resp SpO2  01/09/19 0505 132/79 98.1 F (36.7 C) Oral 99 18 94 %  01/08/19 2048 (!) 141/81 98 F (36.7 C) Oral 89 16 94 %  01/08/19 1440 134/67 97.9 F (36.6 C) Oral 82 18 94 %     Recent laboratory studies:  Recent Labs    01/08/19 0344 01/09/19 0746  WBC 10.2 9.9  HGB 11.3* 11.7*  HCT 36.1 35.5*  PLT 267 288     Discharge Medications:   Allergies as of 01/09/2019      Reactions   Latex    Band aids= if left on for extended period of time   Celecoxib    REACTION: Rash (celebrex)   Cephalexin    REACTION: Rash (keflex)   Codeine    REACTION: Rash  Erythromycin    REACTION: Rash (emycin)   Sulfonamide Derivatives    REACTION: Rash   Trazodone And Nefazodone    insomnia   Venlafaxine    REACTION: Blurred Vision Insurance claims handler)      Medication List    STOP taking these medications   acetaminophen 500 MG tablet Commonly known as: TYLENOL   aspirin 81 MG tablet Replaced by: aspirin EC 325 MG tablet   meloxicam 15 MG tablet Commonly known as: MOBIC     TAKE these medications   accu-chek multiclix lancets Use to check blood glucose daily   acyclovir 400 MG tablet Commonly known as: ZOVIRAX Take 1 tablet (400 mg total) by mouth 3 (three) times daily. What changed:   when to take this  reasons to  take this   aspirin EC 325 MG tablet Take 1 tablet (325 mg total) by mouth 2 (two) times daily after a meal. Take x 1 month post op to decrease risk of blood clots. Replaces: aspirin 81 MG tablet   cetirizine 10 MG tablet Commonly known as: ZYRTEC Take 10 mg by mouth daily.   clonazePAM 1 MG tablet Commonly known as: KLONOPIN Take 1 mg by mouth at bedtime as needed for anxiety.   docusate sodium 100 MG capsule Commonly known as: Colace Take 1 capsule (100 mg total) by mouth 2 (two) times daily.   fluticasone 50 MCG/ACT nasal spray Commonly known as: FLONASE Use 1 to 2 sprays each nostril 1 to 2 x /day What changed:   how much to take  how to take this  when to take this  reasons to take this   glucose blood test strip Commonly known as: Accu-Chek Aviva Plus CHECK BLOOD SUGAR 1 TIME DAILY. DX-R73.03   hydrochlorothiazide 25 MG tablet Commonly known as: HYDRODIURIL TAKE 1 TABLET BY MOUTH EVERY MORNING FOR BLOOD PRESSURE AND FLUID RETENTION What changed: See the new instructions.   lamoTRIgine 150 MG tablet Commonly known as: LAMICTAL Take 1 & 1/2 tablets at bedtime What changed:   how much to take  how to take this  when to take this  additional instructions   Lysine 500 MG Tabs Take 1,000 mg by mouth daily. Currently taking 2 pill QD   Magnesium 500 MG Tabs Take 1,000 mg by mouth 3 (three) times daily. Takes 1000mg  TID   multivitamin with minerals Tabs tablet Take 0.5 tablets by mouth 2 (two) times daily.   ondansetron 4 MG tablet Commonly known as: ZOFRAN Take 1 tablet (4 mg total) by mouth every 6 (six) hours as needed for nausea.   oxyCODONE-acetaminophen 5-325 MG tablet Commonly known as: PERCOCET/ROXICET Take 1 tablet by mouth every 8 (eight) hours as needed (breakthrough pain).   QUEtiapine 100 MG tablet Commonly known as: SEROQUEL Take 100 mg by mouth 3 (three) times daily.   rosuvastatin 40 MG tablet Commonly known as: CRESTOR TAKE 1  TABLET BY MOUTH DAILY What changed:   how much to take  when to take this  additional instructions   tiZANidine 2 MG tablet Commonly known as: ZANAFLEX Take 1 tablet (2 mg total) by mouth every 8 (eight) hours as needed for muscle spasms.   traMADol 50 MG tablet Commonly known as: ULTRAM Take 1-2 tablets (50-100 mg total) by mouth every 6 (six) hours.   VITAMIN D PO Take 8,000 Int'l Units by mouth daily. 2000 units per capsule            Discharge Care Instructions  (From  admission, onward)         Start     Ordered   01/09/19 0000  Weight bearing as tolerated    Question Answer Comment  Laterality left   Extremity Lower      01/09/19 0825          Diagnostic Studies: Dg Chest 2 View  Result Date: 01/03/2019 CLINICAL DATA:  Preoperative for knee replacement.  Nonsmoker. EXAM: CHEST - 2 VIEW COMPARISON:  Radiographs 09/15/2017, 04/05/2013 FINDINGS: No consolidation, features of edema, pneumothorax, or effusion. Cardiomediastinal contours are stable from prior. No acute osseous or soft tissue abnormality. Mild degenerative changes in the imaged spine. IMPRESSION: No active cardiopulmonary disease. Electronically Signed   By: Lovena Le M.D.   On: 01/03/2019 21:07    Disposition: Discharge disposition: 01-Home or Self Care       Discharge Instructions    CPM   Complete by: As directed    Continuous passive motion machine (CPM):      Use the CPM from 0 to 70 for 8 hours per day.      You may increase by 5-10 per day.  You may break it up into 2 or 3 sessions per day.      Use CPM for 1-2 weeks or until you are told to stop.   Call MD / Call 911   Complete by: As directed    If you experience chest pain or shortness of breath, CALL 911 and be transported to the hospital emergency room.  If you develope a fever above 101 F, pus (white drainage) or increased drainage or redness at the wound, or calf pain, call your surgeon's office.   Call MD / Call 911    Complete by: As directed    If you experience chest pain or shortness of breath, CALL 911 and be transported to the hospital emergency room.  If you develope a fever above 101 F, pus (white drainage) or increased drainage or redness at the wound, or calf pain, call your surgeon's office.   Constipation Prevention   Complete by: As directed    Drink plenty of fluids.  Prune juice may be helpful.  You may use a stool softener, such as Colace (over the counter) 100 mg twice a day.  Use MiraLax (over the counter) for constipation as needed.   Diet - low sodium heart healthy   Complete by: As directed    Diet general   Complete by: As directed    Do not put a pillow under the knee. Place it under the heel.   Complete by: As directed    Increase activity slowly as tolerated   Complete by: As directed    Increase activity slowly as tolerated   Complete by: As directed    Weight bearing as tolerated   Complete by: As directed    Laterality: left   Extremity: Lower      Follow-up Information    Gary Fleet, PA-C. Schedule an appointment as soon as possible for a visit in 2 weeks.   Specialty: Orthopedic Surgery Contact information: [address] [place] [address] [phone]        [facility] Follow up.   Specialty: Home Health Services Why: agency will provide home health physical therapy. agency will call you to schedule first visit. Contact information: [address] [place] [address] [phone]  Signed: Erlene Senters 01/09/2019, 8:26 AM

## 2019-01-10 ENCOUNTER — Encounter (HOSPITAL_COMMUNITY): Payer: Self-pay | Admitting: Orthopedic Surgery

## 2019-02-14 ENCOUNTER — Encounter: Payer: Self-pay | Admitting: Internal Medicine

## 2019-02-14 NOTE — Progress Notes (Signed)
History of Present Illness:      This very nice 63 y.o. MWF presents for 3 month follow up with HTN, HLD, Pre-Diabetes and Vitamin D Deficiency. Also, patient is on CPAP for OSA with improved restorative sleep since 2004. On July 17, patient underwent Lt TKR.      She has been on SS Disability for Panic/Anxiety attacks & Depression since 2007 (age 65).       Patient is treated for HTN & BP has been controlled at home. Today's BP is at goal - 124/78. Patient has had no complaints of any cardiac type chest pain, palpitations, dyspnea / orthopnea / PND, dizziness, claudication, or dependent edema.      Hyperlipidemia is controlled with diet & meds. Patient denies myalgias or other med SE's. Last Lipids were at goal: Lab Results  Component Value Date   CHOL 161 11/02/2018   HDL 44 (L) 11/02/2018   LDLCALC 90 11/02/2018   TRIG 169 (H) 11/02/2018   CHOLHDL 3.7 11/02/2018        Also, the patient has history of PreDiabetes  (A1c 5.7% / 2010)  and she has had no symptoms of reactive hypoglycemia, diabetic polys, paresthesias or visual blurring.  Last A1c was not at goal: Lab Results  Component Value Date   HGBA1C 5.8 (H) 01/03/2019       Further, the patient also has history of Vitamin D Deficiency ("23" / 2008) and supplements vitamin D without any suspected side-effects. Last vitamin D was at goal: Lab Results  Component Value Date   VD25OH 72 07/27/2018   Current Outpatient Medications on File Prior to Visit  Medication Sig  . acyclovir (ZOVIRAX) 400 MG tablet Take 1 tablet (400 mg total) by mouth 3 (three) times daily. (Patient taking differently: Take 400 mg by mouth 3 (three) times daily as needed (fever blisters). )  . aspirin EC 325 MG tablet Take 1 tablet (325 mg total) by mouth 2 (two) times daily after a meal. Take x 1 month post op to decrease risk of blood clots.  . benzonatate (TESSALON) 200 MG capsule Take 200 mg by mouth 3 (three) times daily as needed for cough.  .  cetirizine (ZYRTEC) 10 MG tablet Take 10 mg by mouth daily.   . Cholecalciferol (VITAMIN D PO) Take 8,000 Int'l Units by mouth daily. 2000 units per capsule  . clonazePAM (KLONOPIN) 1 MG tablet Take 1 mg by mouth at bedtime as needed for anxiety.   . fluticasone (FLONASE) 50 MCG/ACT nasal spray Use 1 to 2 sprays each nostril 1 to 2 x /day (Patient taking differently: Place 1 spray into both nostrils daily as needed for allergies. Use 1 to 2 sprays each nostril 1 to 2 x /day)  . glucose blood (ACCU-CHEK AVIVA PLUS) test strip CHECK BLOOD SUGAR 1 TIME DAILY. DX-R73.03  . hydrochlorothiazide (HYDRODIURIL) 25 MG tablet TAKE 1 TABLET BY MOUTH EVERY MORNING FOR BLOOD PRESSURE AND FLUID RETENTION (Patient taking differently: Take 25 mg by mouth daily. TAKE 1 TABLET BY MOUTH EVERY MORNING FOR BLOOD PRESSURE AND FLUID RETENTION)  . Lancets (ACCU-CHEK MULTICLIX) lancets Use to check blood glucose daily  . Lysine 500 MG TABS Take 1,000 mg by mouth daily. Currently taking 2 pill QD  . Magnesium 500 MG TABS Take 1,000 mg by mouth 3 (three) times daily. Takes 1000mg  TID  . Multiple Vitamin (MULTIVITAMIN WITH MINERALS) TABS tablet Take 0.5 tablets by mouth 2 (two) times daily.  Marland Kitchen  ondansetron (ZOFRAN) 4 MG tablet Take 1 tablet (4 mg total) by mouth every 6 (six) hours as needed for nausea.  Marland Kitchen QUEtiapine (SEROQUEL) 100 MG tablet Take 100 mg by mouth 3 (three) times daily.  . rosuvastatin (CRESTOR) 40 MG tablet TAKE 1 TABLET BY MOUTH DAILY (Patient taking differently: Take 20 mg by mouth every Monday, Wednesday, and Friday. Take 1/2 tablet on M, W, F)  . traMADol (ULTRAM) 50 MG tablet Take 1-2 tablets (50-100 mg total) by mouth every 6 (six) hours.  Marland Kitchen lamoTRIgine (LAMICTAL) 150 MG tablet Take 1 & 1/2 tablets at bedtime (Patient taking differently: Take 150 mg by mouth at bedtime. )   No current facility-administered medications on file prior to visit.    Allergies  Allergen Reactions  . Latex     Band aids= if  left on for extended period of time  . Celecoxib     REACTION: Rash (celebrex)  . Cephalexin     REACTION: Rash (keflex)  . Codeine     REACTION: Rash  . Erythromycin     REACTION: Rash (emycin)  . Sulfonamide Derivatives     REACTION: Rash  . Trazodone And Nefazodone     insomnia  . Venlafaxine     REACTION: Blurred Vision Insurance claims handler)   PMHx:   Past Medical History:  Diagnosis Date  . Arthritis   . Depression   . Elevated hemoglobin A1c   . Fibromyalgia   . Hypertension   . Other abnormal glucose 08/09/2013  . Sleep apnea    cpap   > 4 yrs  last sleep study  . Unspecified essential hypertension 08/09/2013  . Unspecified vitamin D deficiency 08/09/2013  . Vitamin D deficiency    Immunization History  Administered Date(s) Administered  . DT 07/05/2015  . Influenza Inj Mdck Quad With Preservative 05/28/2017, 04/13/2018  . Influenza Split 04/06/2012, 04/05/2013, 05/16/2014, 03/21/2015  . Influenza,inj,quad, With Preservative 04/29/2016  . Pneumococcal Conjugate-13 05/16/2014  . Pneumococcal-Unspecified 06/22/1998  . Td 06/22/2004  . Zoster 10/21/2015   Past Surgical History:  Procedure Laterality Date  . ABDOMINAL HYSTERECTOMY  2002   Dr. Brien Mates   . COLONOSCOPY  2009  . JOINT REPLACEMENT     arthroscopy  . TOTAL KNEE ARTHROPLASTY Right 04/14/2013   Procedure: RIGHT TOTAL KNEE ARTHROPLASTY AND LEFT KNEE INJECTION ;  Surgeon: Alta Corning, MD;  Location: [facility];  Service: Orthopedics;  Laterality: Right;  . TOTAL KNEE ARTHROPLASTY Left 01/06/2019   Procedure: TOTAL KNEE ARTHROPLASTY;  Surgeon: Dorna Leitz, MD;  Location: [facility];  Service: Orthopedics;  Laterality: Left;   FHx:    Reviewed / unchanged  SHx:    Reviewed / unchanged   Systems Review:  Constitutional: Denies fever, chills, wt changes, headaches, insomnia, fatigue, night sweats, change in appetite. Eyes: Denies redness, blurred vision, diplopia, discharge, itchy, watery eyes.  ENT: Denies discharge,  congestion, post nasal drip, epistaxis, sore throat, earache, hearing loss, dental pain, tinnitus, vertigo, sinus pain, snoring.  CV: Denies chest pain, palpitations, irregular heartbeat, syncope, dyspnea, diaphoresis, orthopnea, PND, claudication or edema. Respiratory: denies cough, dyspnea, DOE, pleurisy, hoarseness, laryngitis, wheezing.  Gastrointestinal: Denies dysphagia, odynophagia, heartburn, reflux, water brash, abdominal pain or cramps, nausea, vomiting, bloating, diarrhea, constipation, hematemesis, melena, hematochezia  or hemorrhoids. Genitourinary: Denies dysuria, frequency, urgency, nocturia, hesitancy, discharge, hematuria or flank pain. Musculoskeletal: Denies arthralgias, myalgias, stiffness, jt. swelling, pain, limping or strain/sprain.  Skin: Denies pruritus, rash, hives, warts, acne, eczema or change in skin lesion(s). Neuro:  No weakness, tremor, incoordination, spasms, paresthesia or pain. Psychiatric: Denies confusion, memory loss or sensory loss. Endo: Denies change in weight, skin or hair change.  Heme/Lymph: No excessive bleeding, bruising or enlarged lymph nodes.  Physical Exam  BP 124/78   Pulse 92   Temp (!) 97.2 F (36.2 C)   Resp 16   Ht 5\' 5"  (1.651 m)   Wt 181 lb 9.6 oz (82.4 kg)   BMI 30.22 kg/m   Appears over  nourished, well groomed  and in no distress.  Eyes: PERRLA, EOMs, conjunctiva no swelling or erythema. Sinuses: No frontal/maxillary tenderness ENT/Mouth: EAC's clear, TM's nl w/o erythema, bulging. Nares clear w/o erythema, swelling, exudates. Oropharynx clear without erythema or exudates. Oral hygiene is good. Tongue normal, non obstructing. Hearing intact.  Neck: Supple. Thyroid not palpable. Car 2+/2+ without bruits, nodes or JVD. Chest: Respirations nl with BS clear & equal w/o rales, rhonchi, wheezing or stridor.  Cor: Heart sounds normal w/ regular rate and rhythm without sig. murmurs, gallops, clicks or rubs. Peripheral pulses normal and  equal  without edema.  Abdomen: Soft & bowel sounds normal. Non-tender w/o guarding, rebound, hernias, masses or organomegaly.  Lymphatics: Unremarkable.  Musculoskeletal: Full ROM all peripheral extremities, joint stability, 5/5 strength and normal gait.  Skin: Warm, dry without exposed rashes, lesions or ecchymosis apparent.  Neuro: Cranial nerves intact, reflexes equal bilaterally. Sensory-motor testing grossly intact. Tendon reflexes grossly intact.  Pysch: Alert & oriented x 3.  Insight and judgement nl & appropriate. No ideations.  Assessment and Plan:  1. Essential hypertension  - Continue medication, monitor blood pressure at home.  - Continue DASH diet.  Reminder to go to the ER if any CP,  SOB, nausea, dizziness, severe HA, changes vision/speech.  - CBC with Differential/Platelet - COMPLETE METABOLIC PANEL WITH GFR - Magnesium - TSH  2. Hyperlipemia, mixed  - Continue diet/meds, exercise,& lifestyle modifications.  - Continue monitor periodic cholesterol/liver & renal functions   - Lipid panel - TSH  3. Abnormal glucose  - Continue diet, exercise  - Lifestyle modifications.  - Monitor appropriate labs.  - Hemoglobin A1c  4. Vitamin D deficiency  - Continue supplementation.  - VITAMIN D 25 Hydroxyl  5. Prediabetes  - Hemoglobin A1c - Insulin, random  6. OSA on CPAP   7. Medication management  - CBC with Differential/Platelet - COMPLETE METABOLIC PANEL WITH GFR - Magnesium - Lipid panel - TSH - Hemoglobin A1c - Insulin, random - VITAMIN D 25 Hydroxy      Discussed  regular exercise, BP monitoring, weight control to achieve/maintain BMI less than 25 and discussed med and SE's. Recommended labs to assess and monitor clinical status with further disposition pending results of labs.  I discussed the assessment and treatment plan with the patient. The patient was provided an opportunity to ask questions and all were answered. The patient agreed with the  plan and demonstrated an understanding of the instructions.  I provided over 30 minutes of exam, counseling, chart review and  complex critical decision making.  Kirtland Bouchard, MD

## 2019-02-14 NOTE — Patient Instructions (Signed)

## 2019-02-15 ENCOUNTER — Ambulatory Visit: Payer: Medicare Other | Admitting: Internal Medicine

## 2019-02-15 ENCOUNTER — Other Ambulatory Visit: Payer: Self-pay

## 2019-02-15 VITALS — BP 124/78 | HR 92 | Temp 97.2°F | Resp 16 | Ht 65.0 in | Wt 181.6 lb

## 2019-02-15 DIAGNOSIS — G4733 Obstructive sleep apnea (adult) (pediatric): Secondary | ICD-10-CM

## 2019-02-15 DIAGNOSIS — E559 Vitamin D deficiency, unspecified: Secondary | ICD-10-CM | POA: Diagnosis not present

## 2019-02-15 DIAGNOSIS — Z79899 Other long term (current) drug therapy: Secondary | ICD-10-CM

## 2019-02-15 DIAGNOSIS — Z9989 Dependence on other enabling machines and devices: Secondary | ICD-10-CM

## 2019-02-15 DIAGNOSIS — R7309 Other abnormal glucose: Secondary | ICD-10-CM | POA: Diagnosis not present

## 2019-02-15 DIAGNOSIS — E782 Mixed hyperlipidemia: Secondary | ICD-10-CM

## 2019-02-15 DIAGNOSIS — I10 Essential (primary) hypertension: Secondary | ICD-10-CM | POA: Diagnosis not present

## 2019-02-15 DIAGNOSIS — R7303 Prediabetes: Secondary | ICD-10-CM

## 2019-02-16 LAB — COMPLETE METABOLIC PANEL WITH GFR
AG Ratio: 1.6 (calc) (ref 1.0–2.5)
ALT: 20 U/L (ref 6–29)
AST: 18 U/L (ref 10–35)
Albumin: 4.4 g/dL (ref 3.6–5.1)
Alkaline phosphatase (APISO): 90 U/L (ref 37–153)
BUN: 13 mg/dL (ref 7–25)
CO2: 26 mmol/L (ref 20–32)
Calcium: 9.9 mg/dL (ref 8.6–10.4)
Chloride: 103 mmol/L (ref 98–110)
Creat: 0.7 mg/dL (ref 0.50–0.99)
GFR, Est African American: 108 mL/min/{1.73_m2} (ref >=60)
GFR, Est Non African American: 93 mL/min/{1.73_m2} (ref >=60)
Globulin: 2.8 g/dL (calc) (ref 1.9–3.7)
Glucose, Bld: 125 mg/dL — ABNORMAL HIGH (ref 65–99)
Potassium: 3.7 mmol/L (ref 3.5–5.3)
Sodium: 140 mmol/L (ref 135–146)
Total Bilirubin: 0.5 mg/dL (ref 0.2–1.2)
Total Protein: 7.2 g/dL (ref 6.1–8.1)

## 2019-02-16 LAB — CBC WITH DIFFERENTIAL/PLATELET
Absolute Monocytes: 475 cells/uL (ref 200–950)
Basophils Absolute: 32 cells/uL (ref 0–200)
Basophils Relative: 0.6 %
Eosinophils Absolute: 130 cells/uL (ref 15–500)
Eosinophils Relative: 2.4 %
HCT: 40.3 % (ref 35.0–45.0)
Hemoglobin: 13.1 g/dL (ref 11.7–15.5)
Lymphs Abs: 1534 cells/uL (ref 850–3900)
MCH: 29.4 pg (ref 27.0–33.0)
MCHC: 32.5 g/dL (ref 32.0–36.0)
MCV: 90.4 fL (ref 80.0–100.0)
MPV: 10.4 fL (ref 7.5–12.5)
Monocytes Relative: 8.8 %
Neutro Abs: 3229 cells/uL (ref 1500–7800)
Neutrophils Relative %: 59.8 %
Platelets: 360 10*3/uL (ref 140–400)
RBC: 4.46 10*6/uL (ref 3.80–5.10)
RDW: 13.1 % (ref 11.0–15.0)
Total Lymphocyte: 28.4 %
WBC: 5.4 10*3/uL (ref 3.8–10.8)

## 2019-02-16 LAB — INSULIN, RANDOM: Insulin: 165.7 u[IU]/mL — ABNORMAL HIGH

## 2019-02-16 LAB — HEMOGLOBIN A1C
Hgb A1c MFr Bld: 5.5 % of total Hgb (ref <5.7)
Mean Plasma Glucose: 111 (calc)
eAG (mmol/L): 6.2 (calc)

## 2019-02-16 LAB — VITAMIN D 25 HYDROXY (VIT D DEFICIENCY, FRACTURES): Vit D, 25-Hydroxy: 90 ng/mL (ref 30–100)

## 2019-02-16 LAB — LIPID PANEL
Cholesterol: 186 mg/dL (ref <200)
HDL: 46 mg/dL — ABNORMAL LOW (ref >=50)
LDL Cholesterol (Calc): 109 mg/dL (calc) — ABNORMAL HIGH
Non-HDL Cholesterol (Calc): 140 mg/dL (calc) — ABNORMAL HIGH (ref <130)
Total CHOL/HDL Ratio: 4 (calc) (ref <5.0)
Triglycerides: 191 mg/dL — ABNORMAL HIGH (ref <150)

## 2019-02-16 LAB — MAGNESIUM: Magnesium: 2.1 mg/dL (ref 1.5–2.5)

## 2019-02-16 LAB — TSH: TSH: 0.98 mIU/L (ref 0.40–4.50)

## 2019-04-18 ENCOUNTER — Other Ambulatory Visit: Payer: Self-pay

## 2019-04-18 ENCOUNTER — Ambulatory Visit: Payer: Medicare Other | Admitting: Internal Medicine

## 2019-04-18 VITALS — BP 142/90 | HR 80 | Temp 97.5°F | Resp 16 | Ht 65.0 in | Wt 186.2 lb

## 2019-04-18 DIAGNOSIS — L02212 Cutaneous abscess of back [any part, except buttock]: Secondary | ICD-10-CM | POA: Diagnosis not present

## 2019-04-18 MED ORDER — LEVOFLOXACIN 500 MG PO TABS: ORAL_TABLET | ORAL | 1 refills | Status: DC

## 2019-04-18 MED ORDER — DOXYCYCLINE HYCLATE 100 MG PO CAPS: ORAL_CAPSULE | ORAL | 1 refills | Status: DC

## 2019-04-18 NOTE — Progress Notes (Signed)
Subjective:    Patient ID: [name], female    DOB: January 07, 1956, 63 y.o.   MRN: [id number]  HPI  This very nice 63 yo MWF presents with a large tender "boil" of her lower middle back with some drainage for the last 3 days. Outpatient Medications Prior to Visit  Medication Sig Dispense Refill  . acyclovir (ZOVIRAX) 400 MG tablet Take 1 tablet (400 mg total) by mouth 3 (three) times daily. (Patient taking differently: Take 400 mg by mouth 3 (three) times daily as needed (fever blisters). ) 21 tablet 2  . aspirin EC 81 MG tablet Take 81 mg by mouth daily.    . benzonatate (TESSALON) 200 MG capsule Take 200 mg by mouth 3 (three) times daily as needed for cough.    . cetirizine (ZYRTEC) 10 MG tablet Take 10 mg by mouth daily.     . Cholecalciferol (VITAMIN D PO) Take 8,000 Int'l Units by mouth daily. 2000 units per capsule    . clonazePAM (KLONOPIN) 1 MG tablet Take 1 mg by mouth at bedtime as needed for anxiety.     . fluticasone (FLONASE) 50 MCG/ACT nasal spray Use 1 to 2 sprays each nostril 1 to 2 x /day (Patient taking differently: Place 1 spray into both nostrils daily as needed for allergies. Use 1 to 2 sprays each nostril 1 to 2 x /day) 48 g 3  . glucose blood (ACCU-CHEK AVIVA PLUS) test strip CHECK BLOOD SUGAR 1 TIME DAILY. DX-R73.03 100 each 1  . hydrochlorothiazide (HYDRODIURIL) 25 MG tablet TAKE 1 TABLET BY MOUTH EVERY MORNING FOR BLOOD PRESSURE AND FLUID RETENTION (Patient taking differently: Take 25 mg by mouth daily. TAKE 1 TABLET BY MOUTH EVERY MORNING FOR BLOOD PRESSURE AND FLUID RETENTION) 90 tablet 1  . Lancets (ACCU-CHEK MULTICLIX) lancets Use to check blood glucose daily 100 each PRN  . Lysine 500 MG TABS Take 1,000 mg by mouth daily. Currently taking 2 pill QD    . Magnesium 500 MG TABS Take 1,000 mg by mouth 3 (three) times daily. Takes 1000mg  TID    . Multiple Vitamin (MULTIVITAMIN WITH MINERALS) TABS tablet Take 0.5 tablets by mouth 2 (two) times daily.    . QUEtiapine  (SEROQUEL) 100 MG tablet Take 100 mg by mouth 3 (three) times daily.    . rosuvastatin (CRESTOR) 40 MG tablet TAKE 1 TABLET BY MOUTH DAILY (Patient taking differently: Take 20 mg by mouth every Monday, Wednesday, and Friday. Take 1/2 tablet on M, W, F) 90 tablet 1  . lamoTRIgine (LAMICTAL) 150 MG tablet Take 1 & 1/2 tablets at bedtime (Patient taking differently: Take 150 mg by mouth at bedtime. ) 135 tablet 1  . aspirin EC 325 MG tablet Take 1 tablet (325 mg total) by mouth 2 (two) times daily after a meal. Take x 1 month post op to decrease risk of blood clots. 60 tablet 0  . ondansetron (ZOFRAN) 4 MG tablet Take 1 tablet (4 mg total) by mouth every 6 (six) hours as needed for nausea. 20 tablet 0  . traMADol (ULTRAM) 50 MG tablet Take 1-2 tablets (50-100 mg total) by mouth every 6 (six) hours. 30 tablet 0   No facility-administered medications prior to visit.    Allergies  Allergen Reactions  . Latex     Band aids= if left on for extended period of time  . Celecoxib     REACTION: Rash (celebrex)  . Cephalexin     REACTION: Rash (keflex)  .  Codeine     REACTION: Rash  . Erythromycin     REACTION: Rash (emycin)  . Sulfonamide Derivatives     REACTION: Rash  . Trazodone And Nefazodone     insomnia  . 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 Arthritis   . Depression   . Elevated hemoglobin A1c   . Fibromyalgia   . Hypertension   . Other abnormal glucose 08/09/2013  . Sleep apnea    cpap   > 4 yrs  last sleep study  . Unspecified essential hypertension 08/09/2013  . Unspecified vitamin D deficiency 08/09/2013  . Vitamin D deficiency    Review of Systems    10 point systems review negative except as above.    Objective:   Physical Exam   BP (!) 142/90   Pulse 80   Temp (!) 97.5 F (36.4 C)   Resp 16   Ht 5\' 5"  (1.651 m)   Wt 186 lb 3.2 oz (84.5 kg)   BMI 30.99 kg/m   HEENT - WNL. Neck - supple.  Chest - Clear  equal BS. Cor - Nl HS. RRR w/o sig MGR. PP 1(+). No edema. MS- FROM w/o deformities.  Gait Nl. Neuro -  Nl w/o focal abnormalities. Skin -  Tender sl indurated area of middle mid lumbar area.     Assessment & Plan:   1. Abscess of lower back  - levofloxacin  500 MG tablet; Take 1 tablet Daily with Food for Infection  Disp: 15 tablet; Refill: 1  - doxycycline  100 MG capsule; Take 1 capsule 2 x /day with Meal for Infection  Disp: 30 capsule; Refill: 1 - Discussed meds / side effects and advised ROV10 days to rechecki

## 2019-04-18 NOTE — Patient Instructions (Signed)
Skin Abscess  A skin abscess is an infected area on or under your skin that contains a collection of pus and other material. An abscess may also be called a furuncle, carbuncle, or boil. An abscess can occur in or on almost any part of your body. Some abscesses break open (rupture) on their own. Most continue to get worse unless they are treated. The infection can spread deeper into the body and eventually into your blood, which can make you feel ill. Treatment usually involves draining the abscess. What are the causes? An abscess occurs when germs, like bacteria, pass through your skin and cause an infection. This may be caused by:  A scrape or cut on your skin.  A puncture wound through your skin, including a needle injection or insect bite.  Blocked oil or sweat glands.  Blocked and infected hair follicles.  A cyst that forms beneath your skin (sebaceous cyst) and becomes infected. What increases the risk? This condition is more likely to develop in people who:  Have a weak body defense system (immune system).  Have diabetes.  Have dry and irritated skin.  Get frequent injections or use illegal IV drugs.  Have a foreign body in a wound, such as a splinter.  Have problems with their lymph system or veins. What are the signs or symptoms? Symptoms of this condition include:  A painful, firm bump under the skin.  A bump with pus at the top. This may break through the skin and drain. Other symptoms include:  Redness surrounding the abscess site.  Warmth.  Swelling of the lymph nodes (glands) near the abscess.  Tenderness.  A sore on the skin. How is this diagnosed? This condition may be diagnosed based on:  A physical exam.  Your medical history.  A sample of pus. This may be used to find out what is causing the infection.  Blood tests.  Imaging tests, such as an ultrasound, CT scan, or MRI. How is this treated? A small abscess that drains on its own may not  need treatment. Treatment for larger abscesses may include:  Moist heat or heat pack applied to the area several times a day.  A procedure to drain the abscess (incision and drainage).  Antibiotic medicines. For a severe abscess, you may first get antibiotics through an IV and then change to antibiotics by mouth. Follow these instructions at home: Medicines   Take over-the-counter and prescription medicines only as told by your health care provider.  If you were prescribed an antibiotic medicine, take it as told by your health care provider. Do not stop taking the antibiotic even if you start to feel better. Abscess care   If you have an abscess that has not drained, apply heat to the affected area. Use the heat source that your health care provider recommends, such as a moist heat pack or a heating pad. ? Place a towel between your skin and the heat source. ? Leave the heat on for 20-30 minutes. ? Remove the heat if your skin turns bright red. This is especially important if you are unable to feel pain, heat, or cold. You may have a greater risk of getting burned.  Follow instructions from your health care provider about how to take care of your abscess. Make sure you: ? Cover the abscess with a bandage (dressing). ? Change your dressing or gauze as told by your health care provider. ? Wash your hands with soap and water before you change the   or gauze. If soap and water are not available, use hand sanitizer.  Check your abscess every day for signs of a worsening infection. Check for: ? More redness, swelling, or pain. ? More fluid or blood. ? Warmth. ? More pus or a bad smell.  General instructions  To avoid spreading the infection: ? Do not share personal care items, towels, or hot tubs with others. ? Avoid making skin contact with other people.  Keep all follow-up visits as told by your health care provider. This is important. Contact a health care provider if you  have:  More redness, swelling, or pain around your abscess.  More fluid or blood coming from your abscess.  Warm skin around your abscess.  More pus or a bad smell coming from your abscess.  A fever.  Muscle aches.  Chills or a general ill feeling. Get help right away if you:  Have severe pain.  See red streaks on your skin spreading away from the abscess. Summary  A skin abscess is an infected area on or under your skin that contains a collection of pus and other material.  A small abscess that drains on its own may not need treatment.  Treatment for larger abscesses may include having a procedure to drain the abscess and taking an antibiotic.   

## 2019-04-22 ENCOUNTER — Encounter: Payer: Self-pay | Admitting: Internal Medicine

## 2019-04-27 ENCOUNTER — Encounter: Payer: Self-pay | Admitting: Internal Medicine

## 2019-04-27 ENCOUNTER — Ambulatory Visit (INDEPENDENT_AMBULATORY_CARE_PROVIDER_SITE_OTHER): Payer: Medicare Other | Admitting: Internal Medicine

## 2019-04-27 ENCOUNTER — Ambulatory Visit: Payer: Medicare Other | Admitting: Internal Medicine

## 2019-04-27 ENCOUNTER — Other Ambulatory Visit: Payer: Self-pay

## 2019-04-27 VITALS — BP 116/80 | HR 76 | Temp 97.2°F | Resp 16 | Ht 65.0 in | Wt 185.4 lb

## 2019-04-27 DIAGNOSIS — L02212 Cutaneous abscess of back [any part, except buttock]: Secondary | ICD-10-CM | POA: Diagnosis not present

## 2019-04-27 MED ORDER — DOXYCYCLINE HYCLATE 100 MG PO CAPS: ORAL_CAPSULE | ORAL | 1 refills | Status: DC

## 2019-04-27 MED ORDER — LEVOFLOXACIN 500 MG PO TABS: ORAL_TABLET | ORAL | 1 refills | Status: DC

## 2019-04-27 MED ORDER — TRAMADOL HCL 50 MG PO TABS: ORAL_TABLET | ORAL | 0 refills | Status: DC

## 2019-04-27 NOTE — Progress Notes (Signed)
Subjective:    Patient ID: [name], female    DOB: December 19, 1955, 63 y.o.   MRN: [id number]  HPI   This nice 63 yo MWF returns for 10 day f/u treatment of a large boil / abscess of her lower back with Doxycycline & Levaquin.  Medication Sig  . acyclovir400 MG tablet Take 1 tablet (400 mg total) by mouth 3 (three) times daily.   Marland Kitchen aspirin EC 81 MG tablet Take 81 mg by mouth daily.  . benzonatate 200 MG capsule Take 200 mg by mouth 3 (three) times daily as needed for cough.  . cetirizine  10 MG tablet Take 10 mg by mouth daily.   Marland Kitchen VITAMIN D  Take 8,000 Int'l Units by mouth daily. 2000 units per capsule  . clonazePAM  1 MG tablet Take 1 mg by mouth at bedtime as needed for anxiety.   Marland Kitchen doxycycline  100 MG capsule Take 1 capsule 2 x /day with Meal for Infection  . FLONASE  nasal spray Use 1 to 2 sprays each nostril 1 to 2 x /day (Patient taking differently: Place 1 spray into both nostrils daily as needed for allergies. Use 1 to 2 sprays each nostril 1 to 2 x /day)  . hydrochlorothiazide  25 MG  TAKE 1 TABLET BY MOUTH EVERY MORNING FOR BLOOD PRESSURE AND FLUID RETENTION (Patient taking differently: Take 25 mg by mouth daily. TAKE 1 TABLET BY MOUTH EVERY MORNING FOR BLOOD PRESSURE AND FLUID RETENTION)  . lamoTRIgine (LAMICTAL) 150 MG tablet Take 1 & 1/2 tablets at bedtime (Patient taking differently: Take 150 mg by mouth at bedtime. )  . Lancets (ACCU-CHEK MULTICLIX) lancets Use to check blood glucose daily  . levofloxacin (LEVAQUIN) 500 MG tablet Take 1 tablet Daily with Food for Infection  . Lysine 500 MG TABS Take 1,000 mg by mouth daily. Currently taking 2 pill QD  . Magnesium 500 MG TABS Take 1,000 mg by mouth 3 (three) times daily. Takes 1000mg  TID  . Multiple Vitamin (MULTIVITAMIN WITH MINERALS) TABS tablet Take 0.5 tablets by mouth 2 (two) times daily.  . QUEtiapine (SEROQUEL) 100 MG tablet Take 100 mg by mouth 3 (three) times daily.  . rosuvastatin (CRESTOR) 40 MG tablet TAKE 1  TABLET BY MOUTH DAILY (Patient taking differently: Take 20 mg by mouth every Monday, Wednesday, and Friday. Take 1/2 tablet on M, W, F)   No facility-administered medications prior to visit.      Review of Systems     Objective:   Physical Exam  BP 116/80   Pulse 76   Temp (!) 97.2 F (36.2 C)   Resp 16   Ht 5\' 5"  (1.651 m)   Wt 185 lb 6.4 oz (84.1 kg)   BMI 30.85 kg/m   HEENT - WNL. Neck - supple.  Chest - Clear equal BS. Cor - Nl HS. RRR w/o sig MGR. PP 1(+). No edema. MS- FROM w/o deformities.  Gait Nl. Neuro -  Nl w/o focal abnormalities. Skin - There is a fluctuant  multi-loculated draining abscess of the upper middle back - mid scapular level which measures 5 cm cross diameter.  Procedure ( CPT - [id number] )  After informed consent and aseptic prep with alcohol, the areas was anesthetized with 4 cc Marcaine 0.5%,  sub-cut & intradermal. Then with a #10 scalpel a 3 cm transverse incision was made down to the level of the firm loculated tissue which was sharply excised and explanted along with a moderate amount of necrotic putrid cyst material and purulent detritus.  Next the cavity was tightly packed with iodoform gauze and edges approximated with #3 vertical mattress sutures of Nylon  and covered with a 4" gauze and then with a 4" x 6 " Tegaderm.   Patient was instructed in post-op wound care.     Assessment & Plan:   1. Abscess of lower back  - levofloxacin (LEVAQUIN) 500 MG tablet; Take 1 tablet Daily with Food for Infection  Dispense: 15 tablet; Refill: 1  - doxycycline (VIBRAMYCIN) 100 MG capsule; Take 1 capsule 2 x /day with Meal for Infection  Dispense: 30 capsule; Refill: 1  - traMADol (ULTRAM) 50 MG tablet; Use 1 to 2 tablets every 4 hours as needed for Severe Pain  Dispense: 30 tablet; Refill: 0  - Recommended to return in 4-5 days to remove packing

## 2019-05-02 ENCOUNTER — Ambulatory Visit: Payer: Medicare Other | Admitting: Internal Medicine

## 2019-05-02 ENCOUNTER — Other Ambulatory Visit: Payer: Self-pay

## 2019-05-02 VITALS — BP 148/84 | HR 84 | Temp 97.5°F | Resp 16 | Ht 65.0 in | Wt 184.2 lb

## 2019-05-02 DIAGNOSIS — L02212 Cutaneous abscess of back [any part, except buttock]: Secondary | ICD-10-CM

## 2019-05-02 NOTE — Progress Notes (Signed)
 -   5 day f/u post I & D large carbuncle lower back   BP (!) 148/84   Pulse 84   Temp (!) 97.5 F (36.4 C)   Resp 16   Ht 5\' 5"  (1.651 m)   Wt 184 lb 3.2 oz (83.6 kg)   BMI 30.65 kg/m    - Less erythema of wound edges -   - Iodoform packing removed ans 2 approximating sutures removed  - wound caviity irrigated x 2 with H2O2,. Then x 1 with Betadine sol'n  - wound efges anesthetized with Marcaine 0.5% x 2 ml  - # 2 sutures of Nylon 3-0 applied to approximate and evert wound edges  - 2" gauze w/ Antibiotic ung and 4 " x 6 " Tegaderm applied   -  ROV 1 week to recheck

## 2019-05-07 NOTE — Progress Notes (Signed)
Subjective:    Patient ID: [name], female    DOB: 10-01-1955, 63 y.o.   MRN: [id number]  HPI    This nice 63 yo MWF returns for 3rd visit f/u I & D of a large Carbuncle of her back with packing removed at last visit 5 days ago and 2 approximating mattress sutures applied. She continues on Doxycycline 100 mg bid     Patient indicates that she'd scheduled for Knee arthroscopy  In 2 days.   Medication Sig  . aspirin EC 81 MG tablet Take 81 mg by mouth daily.  . cetirizine (ZYRTEC) 10 MG tablet Take 10 mg by mouth daily.   . Cholecalciferol (VITAMIN D PO) Take 8,000 Int'l Units  daily. 2000 units per capsule  . clonazePAM (KLONOPIN) 1 MG tablet Take at bedtime as needed for anxiety.   Marland Kitchen doxycycline  100 MG capsule Take 1 capsule 2 x /day with Meal for Infection  . FLONASE nasal spray Use 1 to 2 sprays each nostril 1 to 2 x /day   . hydrochlorothiazide25 MG tablet TAKE 1 TABLET  EVERY MORNING   . lamoTRIgine 150 MG tablet Take 1 & 1/2 tablets at bedtime   . Lysine 500 MG TABS Take 1,000 mg by mouth daily.  . Magnesium 500 MG TABS Take 1,000 mg  3  times daily. Takes 1000mg  TID  . MULTI-VIT WITH MINERALS Take 0.5 tablets  2  times daily.  . QUEtiapine  100 MG tablet Take 100 mg  3 (three) times daily.  . rosuvastatin40 MG tablet Take 1/2 tablet on M, W, F)  . traMADol  50 MG tablet Use 1 to 2 tablets every 4 hours as needed  . acyclovir  400 MG tablet Take 1 tablet  3  times daily.   Allergies  Allergen Reactions  . Latex     Band aids= if left on for extended period of time  . Celecoxib     REACTION: Rash (celebrex)  . Cephalexin     REACTION: Rash (keflex)  . Codeine     REACTION: Rash  . Erythromycin     REACTION: Rash (emycin)  . Sulfonamide Derivatives     REACTION: Rash  . Trazodone And Nefazodone     insomnia  . 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 Arthritis   . Depression   . Elevated hemoglobin  A1c   . Fibromyalgia   . Hypertension   . Other abnormal glucose 08/09/2013  . Sleep apnea    cpap   > 4 yrs  last sleep study  . Unspecified essential hypertension 08/09/2013  . Unspecified vitamin D deficiency 08/09/2013  . Vitamin D deficiency    Past Surgical History:  Procedure Laterality Date  . ABDOMINAL HYSTERECTOMY  2002   Dr. Brien Mates   . COLONOSCOPY  2009  . JOINT REPLACEMENT     arthroscopy  . TOTAL KNEE ARTHROPLASTY Right 04/14/2013   Procedure: RIGHT TOTAL KNEE ARTHROPLASTY AND LEFT KNEE INJECTION ;  Surgeon: Alta Corning, MD;  Location: [facility];  Service: Orthopedics;  Laterality: Right;  . TOTAL KNEE ARTHROPLASTY Left 01/06/2019   Procedure: TOTAL KNEE ARTHROPLASTY;  Surgeon: Dorna Leitz, MD;  Location: [facility];  Service: Orthopedics;  Laterality: Left;   Review of Systems    10 point systems review negative except as above.\    Objective:   Physical Exam   BP  124/68   Pulse 84   Temp (!) 97 F (36.1 C)   Resp 16   Ht 5\' 5"  (1.651 m)   Wt 186 lb 3.2 oz (84.5 kg)   BMI 30.99 kg/m   - Skin focused exam finds the wound on patients back with slight amount of detritus expressed.    - wound irrigated with H2O2   x 2, then with Betadine solution and 2 x 2 cotton dressing applied and patient instructed in wound care. 2 sutures remain in place and patient to return in 1 week for f/u.     Assessment & Plan:   1. Abscess of lower back, s/p I & D  - ROV 1 week.

## 2019-05-08 ENCOUNTER — Other Ambulatory Visit: Payer: Self-pay

## 2019-05-08 ENCOUNTER — Ambulatory Visit: Payer: Medicare Other | Admitting: Internal Medicine

## 2019-05-08 VITALS — BP 124/68 | HR 84 | Temp 97.0°F | Resp 16 | Ht 65.0 in | Wt 186.2 lb

## 2019-05-08 DIAGNOSIS — L02212 Cutaneous abscess of back [any part, except buttock]: Secondary | ICD-10-CM

## 2019-05-15 ENCOUNTER — Other Ambulatory Visit: Payer: Self-pay | Admitting: *Deleted

## 2019-05-15 MED ORDER — HYDROCHLOROTHIAZIDE 25 MG PO TABS: ORAL_TABLET | ORAL | 1 refills | Status: DC

## 2019-05-17 ENCOUNTER — Ambulatory Visit: Payer: Medicare Other | Admitting: Internal Medicine

## 2019-05-17 ENCOUNTER — Other Ambulatory Visit: Payer: Self-pay

## 2019-05-17 VITALS — BP 120/84 | HR 60 | Temp 97.0°F | Resp 16 | Ht 65.0 in | Wt 184.6 lb

## 2019-05-17 DIAGNOSIS — L02212 Cutaneous abscess of back [any part, except buttock]: Secondary | ICD-10-CM

## 2019-05-17 NOTE — Progress Notes (Signed)
   Subjective:    Patient ID: [name], female    DOB: [birth year], 63 y.o.   MRN: [id number]  HPI    F/U I & D of cyst of back     Review of Systems     Objective:   Physical Exam  BP 120/84   Pulse 60   Temp (!) 97 F (36.1 C)   Resp 16   Ht 5\' 5"  (1.651 m)   Wt 184 lb 9.6 oz (83.7 kg)   BMI 30.72 kg/m   - Wound clean & healed in - No sign of infection - sutures removed   - antibiotic ung applied & Tegaderm      Assessment & Plan:    1. Abscess of lower back - resolved

## 2019-05-23 DIAGNOSIS — Z87898 Personal history of other specified conditions: Secondary | ICD-10-CM | POA: Insufficient documentation

## 2019-05-23 DIAGNOSIS — R7309 Other abnormal glucose: Secondary | ICD-10-CM | POA: Insufficient documentation

## 2019-05-23 NOTE — Progress Notes (Signed)
FOLLOW UP  Assessment and Plan:   Hypertension Well controlled with current medications  Monitor blood pressure at home; patient to call if consistently greater than 130/80 Continue DASH diet.   Reminder to go to the ER if any CP, SOB, nausea, dizziness, severe HA, changes vision/speech, left arm numbness and tingling and jaw pain.  Cholesterol Now taking rosuvastatin 20 mg daily LDL goal <100 Continue low cholesterol diet and exercise.  Check lipid panel.   History of prediabetes Recent A1Cs at goal Discussed diet/exercise, weight management  Defer A1C; check CMP  Morbid obesity - BMI 30+ with OSA Long discussion about weight loss, diet, and exercise Recommended diet heavy in fruits and veggies and low in animal meats, cheeses, and dairy products, appropriate calorie intake Discussed ideal weight for height and initial weight goal (<180lb) Patient will work on cutting down on cutting down on sweets in home, increasing exercise after knee surgery  Will follow up in 3 months  OSA on CPAP Endorses 100% compliance with restorative sleep   Vitamin D Def At goal at last visit; continue supplementation to maintain goal of 70-100 Defer Vit D level  Continue diet and meds as discussed. Further disposition pending results of labs. Discussed med's effects and SE's.   Over 30 minutes of exam, counseling, chart review, and critical decision making was performed.   Future Appointments  Date Time Provider [place]  08/23/2019 11:00 AM Unk Pinto, MD [facility] None    ----------------------------------------------------------------------------------------------------------------------  HPI 63 y.o. female  presents for 3 month follow up on hypertension, cholesterol, prediabetes, obesity and vitamin D deficiency.   Patient has been on SS Disability since age 65 (2007) for Panic, Anxiety & Depression, and also has fibromyalgia. She is followed by PA Gareth Eagle for  mental health management.  She has OSA on CPAP, wears daily and endorses restorative sleep with this.   BMI is Body mass index is 30.69 kg/m., she has not been working on diet and exercise; she admits she eats out too much. Exercise is limited due to knee pain, seeing Dr. Berenice Primas and overdue for shots, needs surgery but putting it off for now.  Wt Readings from Last 3 Encounters:  05/24/19 184 lb 6.4 oz (83.6 kg)  05/17/19 184 lb 9.6 oz (83.7 kg)  05/08/19 186 lb 3.2 oz (84.5 kg)   Her blood pressure has been controlled at home, today their BP is BP: 120/68  She does not workout. She denies chest pain, shortness of breath, dizziness.   She is on cholesterol medication, rosuvastatin 20 mg daily since last visit and denies myalgias. Her cholesterol is not at goal. The cholesterol last visit was:   Lab Results  Component Value Date   CHOL 186 02/15/2019   HDL 46 (L) 02/15/2019   LDLCALC 109 (H) 02/15/2019   TRIG 191 (H) 02/15/2019   CHOLHDL 4.0 02/15/2019    She has not been working on diet and exercise for hx of prediabetes, and denies increased appetite, nausea, paresthesia of the feet, polydipsia, polyuria, visual disturbances and vomiting. Last A1C in the office was:  Lab Results  Component Value Date   HGBA1C 5.5 02/15/2019   Patient is on Vitamin D supplement and at goal at last check:    Lab Results  Component Value Date   VD25OH 90 02/15/2019        Current Medications:  Current Outpatient Medications on File Prior to Visit  Medication Sig  . aspirin EC 81 MG tablet Take  81 mg by mouth daily.  . cetirizine (ZYRTEC) 10 MG tablet Take 10 mg by mouth daily.   . Cholecalciferol (VITAMIN D PO) Take 8,000 Int'l Units by mouth daily. 2000 units per capsule  . clonazePAM (KLONOPIN) 1 MG tablet Take 1 mg by mouth at bedtime as needed for anxiety.   . fluticasone (FLONASE) 50 MCG/ACT nasal spray Use 1 to 2 sprays each nostril 1 to 2 x /day (Patient taking differently: Place 1  spray into both nostrils daily as needed for allergies. Use 1 to 2 sprays each nostril 1 to 2 x /day)  . glucose blood (ACCU-CHEK AVIVA PLUS) test strip CHECK BLOOD SUGAR 1 TIME DAILY. [id number]  . hydrochlorothiazide (HYDRODIURIL) 25 MG tablet TAKE 1 TABLET EVERY MORNING FOR BLOOD PRESSURE AND FLUID RETENTION  . lamoTRIgine (LAMICTAL) 150 MG tablet Take 1 & 1/2 tablets at bedtime (Patient taking differently: Take 150 mg by mouth at bedtime. )  . Lancets (ACCU-CHEK MULTICLIX) lancets Use to check blood glucose daily  . Lysine 500 MG TABS Take 1,000 mg by mouth daily. Currently taking 2 pill QD  . Magnesium 500 MG TABS Take 3,000 mg by mouth 3 (three) times daily. Takes 1000mg  TID  . Multiple Vitamin (MULTIVITAMIN WITH MINERALS) TABS tablet Take 0.5 tablets by mouth 2 (two) times daily.  . QUEtiapine (SEROQUEL) 100 MG tablet Take 100 mg by mouth 3 (three) times daily.  . rosuvastatin (CRESTOR) 40 MG tablet TAKE 1 TABLET BY MOUTH DAILY (Patient taking differently: Take 20 mg by mouth every Monday, Wednesday, and Friday. Take 1/2 tablet everyday)  . traMADol (ULTRAM) 50 MG tablet Use 1 to 2 tablets every 4 hours as needed for Severe Pain  . doxycycline (VIBRAMYCIN) 100 MG capsule Take 1 capsule 2 x /day with Meal for Infection  . levofloxacin (LEVAQUIN) 500 MG tablet Take 1 tablet Daily with Food for Infection   No current facility-administered medications on file prior to visit.      Allergies:  Allergies  Allergen Reactions  . Latex     Band aids= if left on for extended period of time  . Celecoxib     REACTION: Rash (celebrex)  . Cephalexin     REACTION: Rash (keflex)  . Codeine     REACTION: Rash  . Erythromycin     REACTION: Rash (emycin)  . Sulfonamide Derivatives     REACTION: Rash  . Trazodone And Nefazodone     insomnia  . Venlafaxine     REACTION: Blurred Vision Insurance claims handler)     Medical History:  Past Medical History:  Diagnosis Date  . Arthritis   . Depression   .  Elevated hemoglobin A1c   . Fibromyalgia   . Hypertension   . Other abnormal glucose 08/09/2013  . Sleep apnea    cpap   > 4 yrs  last sleep study  . Unspecified essential hypertension 08/09/2013  . Unspecified vitamin D deficiency 08/09/2013  . Vitamin D deficiency    Family history- Reviewed and unchanged Social history- Reviewed and unchanged   Review of Systems:  Review of Systems  Constitutional: Negative for malaise/fatigue and weight loss.  HENT: Negative for hearing loss, sore throat and tinnitus.   Eyes: Negative for blurred vision and double vision.  Respiratory: Negative for cough, sputum production, shortness of breath and wheezing.   Cardiovascular: Negative for chest pain, palpitations, orthopnea, claudication and leg swelling.  Gastrointestinal: Negative for abdominal pain, blood in stool, constipation, diarrhea, heartburn, melena, nausea and  vomiting.  Genitourinary: Negative.   Musculoskeletal: Positive for joint pain (right knee - scheduled for surgery ). Negative for myalgias.  Skin: Negative for rash.  Neurological: Negative for dizziness, tingling, sensory change, weakness and headaches.  Endo/Heme/Allergies: Negative for environmental allergies and polydipsia.  Psychiatric/Behavioral: Negative for depression and suicidal ideas. The patient is not nervous/anxious and does not have insomnia.   All other systems reviewed and are negative.    Physical Exam: BP 120/68   Pulse 92   Temp (!) 97.5 F (36.4 C)   Ht 5\' 5"  (1.651 m)   Wt 184 lb 6.4 oz (83.6 kg)   SpO2 97%   BMI 30.69 kg/m  Wt Readings from Last 3 Encounters:  05/24/19 184 lb 6.4 oz (83.6 kg)  05/17/19 184 lb 9.6 oz (83.7 kg)  05/08/19 186 lb 3.2 oz (84.5 kg)   General Appearance: Well nourished, in no apparent distress. Eyes: PERRLA, EOMs, conjunctiva no swelling or erythema Sinuses: No Frontal/maxillary tenderness ENT/Mouth: Ext aud canals clear, TMs without erythema, bulging. No erythema,  swelling, or exudate on post pharynx.  Tonsils not swollen or erythematous. Hearing normal.  Neck: Supple, thyroid normal.  Respiratory: Respiratory effort normal, BS equal bilaterally without rales, rhonchi, wheezing or stridor.  Cardio: RRR with no MRGs. Brisk peripheral pulses without edema.  Abdomen: Soft, + BS.  Non tender, no guarding, rebound, hernias, masses. Lymphatics: Non tender without lymphadenopathy.  Musculoskeletal: Full ROM, 5/5 strength, Antalgic gait Skin: Warm, dry without rashes, lesions, ecchymosis; well healed small incision to lower back Neuro: Cranial nerves intact. No cerebellar symptoms.  Psych: Awake and oriented X 3, normal affect, Insight and Judgment appropriate.   Vanessa Ribas, NP 4:19 PM [facility]

## 2019-05-24 ENCOUNTER — Ambulatory Visit (INDEPENDENT_AMBULATORY_CARE_PROVIDER_SITE_OTHER): Payer: Medicare Other | Admitting: Adult Health

## 2019-05-24 ENCOUNTER — Encounter: Payer: Self-pay | Admitting: Adult Health

## 2019-05-24 ENCOUNTER — Other Ambulatory Visit: Payer: Self-pay

## 2019-05-24 DIAGNOSIS — G4733 Obstructive sleep apnea (adult) (pediatric): Secondary | ICD-10-CM

## 2019-05-24 DIAGNOSIS — Z87898 Personal history of other specified conditions: Secondary | ICD-10-CM

## 2019-05-24 DIAGNOSIS — E782 Mixed hyperlipidemia: Secondary | ICD-10-CM | POA: Diagnosis not present

## 2019-05-24 DIAGNOSIS — I10 Essential (primary) hypertension: Secondary | ICD-10-CM

## 2019-05-24 DIAGNOSIS — Z9989 Dependence on other enabling machines and devices: Secondary | ICD-10-CM

## 2019-05-24 DIAGNOSIS — F3341 Major depressive disorder, recurrent, in partial remission: Secondary | ICD-10-CM

## 2019-05-24 DIAGNOSIS — E559 Vitamin D deficiency, unspecified: Secondary | ICD-10-CM | POA: Diagnosis not present

## 2019-05-24 NOTE — Patient Instructions (Addendum)
Goals    . Exercise 150 min/wk Moderate Activity    . HEMOGLOBIN A1C < 5.7    . Weight (lb) < 180 lb (81.6 kg)        Aim for 7+ servings of fruits and vegetables daily  65-80+ fluid ounces of water or unsweet tea for healthy kidneys  Limit to max 1 drink of alcohol per day; avoid smoking/tobacco  Limit animal fats in diet for cholesterol and heart health - choose grass fed whenever available  Avoid highly processed foods, and foods high in saturated/trans fats  Aim for low stress - take time to unwind and care for your mental health  Aim for 150 min of moderate intensity exercise weekly for heart health, and weights twice weekly for bone health  Aim for 7-9 hours of sleep daily

## 2019-05-25 LAB — COMPLETE METABOLIC PANEL WITH GFR
AG Ratio: 1.6 (calc) (ref 1.0–2.5)
ALT: 19 U/L (ref 6–29)
AST: 18 U/L (ref 10–35)
Albumin: 4.3 g/dL (ref 3.6–5.1)
Alkaline phosphatase (APISO): 97 U/L (ref 37–153)
BUN: 15 mg/dL (ref 7–25)
CO2: 26 mmol/L (ref 20–32)
Calcium: 10.1 mg/dL (ref 8.6–10.4)
Chloride: 102 mmol/L (ref 98–110)
Creat: 0.72 mg/dL (ref 0.50–0.99)
GFR, Est African American: 104 mL/min/{1.73_m2} (ref >=60)
GFR, Est Non African American: 90 mL/min/{1.73_m2} (ref >=60)
Globulin: 2.7 g/dL (calc) (ref 1.9–3.7)
Glucose, Bld: 145 mg/dL — ABNORMAL HIGH (ref 65–99)
Potassium: 3.5 mmol/L (ref 3.5–5.3)
Sodium: 140 mmol/L (ref 135–146)
Total Bilirubin: 0.5 mg/dL (ref 0.2–1.2)
Total Protein: 7 g/dL (ref 6.1–8.1)

## 2019-05-25 LAB — CBC WITH DIFFERENTIAL/PLATELET
Absolute Monocytes: 519 cells/uL (ref 200–950)
Basophils Absolute: 29 cells/uL (ref 0–200)
Basophils Relative: 0.6 %
Eosinophils Absolute: 132 cells/uL (ref 15–500)
Eosinophils Relative: 2.7 %
HCT: 40.7 % (ref 35.0–45.0)
Hemoglobin: 13.5 g/dL (ref 11.7–15.5)
Lymphs Abs: 1431 cells/uL (ref 850–3900)
MCH: 29 pg (ref 27.0–33.0)
MCHC: 33.2 g/dL (ref 32.0–36.0)
MCV: 87.5 fL (ref 80.0–100.0)
MPV: 10.8 fL (ref 7.5–12.5)
Monocytes Relative: 10.6 %
Neutro Abs: 2788 cells/uL (ref 1500–7800)
Neutrophils Relative %: 56.9 %
Platelets: 343 10*3/uL (ref 140–400)
RBC: 4.65 10*6/uL (ref 3.80–5.10)
RDW: 13.4 % (ref 11.0–15.0)
Total Lymphocyte: 29.2 %
WBC: 4.9 10*3/uL (ref 3.8–10.8)

## 2019-05-25 LAB — TSH: TSH: 1.31 mIU/L (ref 0.40–4.50)

## 2019-05-25 LAB — LIPID PANEL
Cholesterol: 123 mg/dL (ref <200)
HDL: 51 mg/dL (ref >=50)
LDL Cholesterol (Calc): 50 mg/dL (calc)
Non-HDL Cholesterol (Calc): 72 mg/dL (calc) (ref <130)
Total CHOL/HDL Ratio: 2.4 (calc) (ref <5.0)
Triglycerides: 140 mg/dL (ref <150)

## 2019-05-25 LAB — MAGNESIUM: Magnesium: 2.1 mg/dL (ref 1.5–2.5)

## 2019-06-14 ENCOUNTER — Other Ambulatory Visit: Payer: Self-pay | Admitting: Internal Medicine

## 2019-06-14 ENCOUNTER — Ambulatory Visit
Admission: RE | Admit: 2019-06-14 | Discharge: 2019-06-14 | Disposition: A | Discharge status: Home / Self Care | Payer: Medicare Other | Source: Ambulatory Visit | Attending: Internal Medicine | Admitting: Internal Medicine

## 2019-06-14 ENCOUNTER — Other Ambulatory Visit: Payer: Self-pay

## 2019-06-14 DIAGNOSIS — Z1231 Encounter for screening mammogram for malignant neoplasm of breast: Secondary | ICD-10-CM

## 2019-06-19 ENCOUNTER — Other Ambulatory Visit: Payer: Self-pay | Admitting: Internal Medicine

## 2019-08-14 DIAGNOSIS — G4733 Obstructive sleep apnea (adult) (pediatric): Secondary | ICD-10-CM | POA: Diagnosis not present

## 2019-08-15 ENCOUNTER — Encounter: Payer: Self-pay | Admitting: Internal Medicine

## 2019-08-22 ENCOUNTER — Encounter: Payer: Self-pay | Admitting: Internal Medicine

## 2019-08-22 NOTE — Patient Instructions (Signed)

## 2019-08-22 NOTE — Progress Notes (Signed)
Annual Screening/Preventative Visit & Comprehensive Evaluation &  Examination     This very nice 64 y.o. MWF  presents for a Screening /Preventative Visit & comprehensive evaluation and management of multiple medical co-morbidities.  Patient has been followed for HTN, HLD, Prediabetes  and Vitamin D Deficiency. Patient has OSA on CPAP (2004) with improved sleep hygiene. Patient has been on SS Disability since 43 (age 41)  for Panic / Anxiety Attacks and Depression.      Today, patient is c/o ongoing pains in hands & fingers and feels fingers ar splaying laterally and requests referral to a Rheumatologist for definitive diagnosis.       HTN predates since 2004. Patient's BP has been controlled at home and patient denies any cardiac symptoms as chest pain, palpitations, shortness of breath, dizziness or ankle swelling. Today's BP is borderline elevated at 148/82.      Patient's hyperlipidemia is controlled with diet and medications. Patient denies myalgias or other medication SE's. Last lipids were at goal:  Lab Results  Component Value Date   CHOL 123 05/24/2019   HDL 51 05/24/2019   LDLCALC 50 05/24/2019   TRIG 140 05/24/2019   CHOLHDL 2.4 05/24/2019       Patient has  Moderate Obesity (BMI 33) and consequent prediabetes (A1c 5.7% / 2010) and patient denies reactive hypoglycemic symptoms, visual blurring, diabetic polys or paresthesias. Last A1c was Normal & at goal:  Lab Results  Component Value Date   HGBA1C 5.5 02/15/2019       Finally, patient has history of Vitamin D Deficiency ("23" / 2008) and last Vitamin D was at goal:  Lab Results  Component Value Date   VD25OH 90 02/15/2019    Current Outpatient Medications on File Prior to Visit  Medication Sig  . aspirin EC 81 MG tablet Take 81 mg by mouth daily.  . cetirizine (ZYRTEC) 10 MG tablet Take 10 mg by mouth daily.   . Cholecalciferol (VITAMIN D PO) Take 8,000 Int'l Units by mouth daily. 2000 units per capsule  .  clonazePAM (KLONOPIN) 1 MG tablet Take 1 mg by mouth at bedtime as needed for anxiety.   . fluticasone (FLONASE) 50 MCG/ACT nasal spray Use 1 to 2 sprays each nostril 1 to 2 x /day (Patient taking differently: Place 1 spray into both nostrils daily as needed for allergies. Use 1 to 2 sprays each nostril 1 to 2 x /day)  . glucose blood (ACCU-CHEK AVIVA PLUS) test strip CHECK BLOOD SUGAR 1 TIME DAILY. [id number]  . hydrochlorothiazide (HYDRODIURIL) 25 MG tablet TAKE 1 TABLET EVERY MORNING FOR BLOOD PRESSURE AND FLUID RETENTION  . Lancets (ACCU-CHEK MULTICLIX) lancets Use to check blood glucose daily  . Lysine 500 MG TABS Take 1,000 mg by mouth daily. Currently taking 2 pill QD  . Magnesium 500 MG TABS Take 3,000 mg by mouth 3 (three) times daily. Takes 1000mg  TID  . Multiple Vitamin (MULTIVITAMIN WITH MINERALS) TABS tablet Take 0.5 tablets by mouth 2 (two) times daily.  . QUEtiapine (SEROQUEL) 100 MG tablet Take 100 mg by mouth 3 (three) times daily.  . rosuvastatin (CRESTOR) 40 MG tablet Take 1 tablet Daily for Cholesterol  . traMADol (ULTRAM) 50 MG tablet Use 1 to 2 tablets every 4 hours as needed for Severe Pain  . lamoTRIgine (LAMICTAL) 150 MG tablet Take 1 & 1/2 tablets at bedtime (Patient taking differently: Take 150 mg by mouth at bedtime. )   No current facility-administered medications on file prior to visit.  Allergies  Allergen Reactions  . Latex     Band aids= if left on for extended period of time  . Celecoxib     REACTION: Rash (celebrex)  . Cephalexin     REACTION: Rash (keflex)  . Codeine     REACTION: Rash  . Erythromycin     REACTION: Rash (emycin)  . Sulfonamide Derivatives     REACTION: Rash  . Trazodone And Nefazodone     insomnia  . 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 Arthritis   . Depression   . Elevated hemoglobin A1c   . Fibromyalgia   . Hypertension   . Other abnormal glucose 08/09/2013  . Sleep  apnea    cpap   > 4 yrs  last sleep study  . Unspecified essential hypertension 08/09/2013  . Unspecified vitamin D deficiency 08/09/2013  . Vitamin D deficiency    Health Maintenance  Topic Date Due  . MAMMOGRAM  06/13/2021  . TETANUS/TDAP  07/04/2025  . COLONOSCOPY  12/02/2027  . INFLUENZA VACCINE  Completed  . Hepatitis C Screening  Completed  . HIV Screening  Completed  . PAP SMEAR-Modifier  Discontinued   Immunization History  Administered Date(s) Administered  . DT (Pediatric) 07/05/2015  . Influenza Inj Mdck Quad With Preservative 05/28/2017, 04/13/2018  . Influenza Split 04/06/2012, 04/05/2013, 05/16/2014, 03/21/2015  . Influenza,inj,Quad PF,6+ Mos 03/16/2019  . Influenza,inj,quad, With Preservative 04/29/2016  . Pneumococcal Conjugate-13 05/16/2014  . Pneumococcal-Unspecified 06/22/1998  . Td 06/22/2004  . Zoster 10/21/2015    Last Colon - 06.12.2019 - Dr Ardis Hughs recc 10 yr f/u due June 2029  Last MGM - 12.24.2020  Past Surgical History:  Procedure Laterality Date  . ABDOMINAL HYSTERECTOMY  2002   Dr. Brien Mates   . COLONOSCOPY  2009  . JOINT REPLACEMENT     arthroscopy  . TOTAL KNEE ARTHROPLASTY Right 04/14/2013   Procedure: RIGHT TOTAL KNEE ARTHROPLASTY AND LEFT KNEE INJECTION ;  Surgeon: Alta Corning, MD;  Location: [facility];  Service: Orthopedics;  Laterality: Right;  . TOTAL KNEE ARTHROPLASTY Left 01/06/2019   Procedure: TOTAL KNEE ARTHROPLASTY;  Surgeon: Dorna Leitz, MD;  Location: [facility];  Service: Orthopedics;  Laterality: Left;   Family History  Problem Relation Age of Onset  . Hypertension Mother   . Heart attack Mother   . Hypertension Father   . COPD Father   . Cancer Father        thyroid  . CVA Maternal Grandmother 72  . Heart attack Maternal Grandfather 59  . Deep vein thrombosis Maternal Grandfather   . Colon cancer Neg Hx   . Esophageal cancer Neg Hx   . Stomach cancer Neg Hx    Social History   Tobacco Use  . Smoking status: Never  Smoker  . Smokeless tobacco: Never Used  Substance Use Topics  . Alcohol use: No  . Drug use: No    ROS Constitutional: Denies fever, chills, weight loss/gain, headaches, insomnia,  night sweats, and change in appetite. Does c/o fatigue. Eyes: Denies redness, blurred vision, diplopia, discharge, itchy, watery eyes.  ENT: Denies discharge, congestion, post nasal drip, epistaxis, sore throat, earache, hearing loss, dental pain, Tinnitus, Vertigo, Sinus pain, snoring.  Cardio: Denies chest pain, palpitations, irregular heartbeat, syncope, dyspnea, diaphoresis, orthopnea, PND, claudication, edema Respiratory: denies cough, dyspnea, DOE, pleurisy, hoarseness, laryngitis, wheezing.  Gastrointestinal: Denies dysphagia, heartburn, reflux, water brash, pain, cramps, nausea, vomiting, bloating,  diarrhea, constipation, hematemesis, melena, hematochezia, jaundice, hemorrhoids Genitourinary: Denies dysuria, frequency, urgency, nocturia, hesitancy, discharge, hematuria, flank pain Breast: Breast lumps, nipple discharge, bleeding.  Musculoskeletal:  C/O arthralgias hands/fingers. Denies myalgia, stiffness, Jt. Swelling, pain, limp, and strain/sprain. Denies falls. Skin: Denies puritis, rash, hives, warts, acne, eczema, changing in skin lesion Neuro: No weakness, tremor, incoordination, spasms, paresthesia, pain Psychiatric: Denies confusion, memory loss, sensory loss. Denies Depression. Endocrine: Denies change in weight, skin, hair change, nocturia, and paresthesia, diabetic polys, visual blurring, hyper / hypo glycemic episodes.  Heme/Lymph: No excessive bleeding, bruising, enlarged lymph nodes.  Physical Exam  BP (!) 148/82   Pulse 76   Temp (!) 97 F (36.1 C)   Resp 16   Ht 5\' 5"  (1.651 m)   Wt 191 lb 6.4 oz (86.8 kg)   BMI 31.85 kg/m   General Appearance: Over nourished, well groomed and in no apparent distress.  Eyes: PERRLA, EOMs, conjunctiva no swelling or erythema, normal fundi and  vessels. Sinuses: No frontal/maxillary tenderness ENT/Mouth: EACs patent / TMs  nl. Nares clear without erythema, swelling, mucoid exudates. Oral hygiene is good. No erythema, swelling, or exudate. Tongue normal, non-obstructing. Tonsils not swollen or erythematous. Hearing normal.  Neck: Supple, thyroid not palpable. No bruits, nodes or JVD. Respiratory: Respiratory effort normal.  BS equal and clear bilateral without rales, rhonci, wheezing or stridor. Cardio: Heart sounds are normal with regular rate and rhythm and no murmurs, rubs or gallops. Peripheral pulses are normal and equal bilaterally without edema. No aortic or femoral bruits. Chest: symmetric with normal excursions and percussion. Breasts: Symmetric, without lumps, nipple discharge, retractions, or fibrocystic changes.  Abdomen: Flat, soft with bowel sounds active. Nontender, no guarding, rebound, hernias, masses, or organomegaly.  Lymphatics: Non tender without lymphadenopathy.  Musculoskeletal: Full ROM all peripheral extremities, joint stability, 5/5 strength, and normal gait. Skin: Warm and dry without rashes, lesions, cyanosis, clubbing or  ecchymosis.  Neuro: Cranial nerves intact, reflexes equal bilaterally. Normal muscle tone, no cerebellar symptoms. Sensation intact.  Pysch: Alert and oriented X 3, normal affect, Insight and Judgment appropriate.   Assessment and Plan  1. Annual Preventative Screening Examination  2. Essential hypertension  - Urinalysis, Routine w reflex microscopic - Microalbumin / creatinine urine ratio - CBC with Differential/Platelet - COMPLETE METABOLIC PANEL WITH GFR - Magnesium - TSH  3. Hyperlipemia, mixed  - EKG 12-Lead - Lipid panel - TSH  4. Abnormal glucose  - EKG 12-Lead - Hemoglobin A1c - Insulin, random  5. Vitamin D deficiency  - VITAMIN D 25 Hydroxy  6. OSA on CPAP  7. Depression, major, recurrent, in partial remission ([place])  8. Prediabetes  - EKG 12-Lead -  Hemoglobin A1c - Insulin, random  9. Screening for colorectal cancer  - POC Hemoccult Bld/Stl  10. Screening for ischemic heart disease  - EKG 12-Lead  11. FH: hypertension  - EKG 12-Lead  12. Medication management  - Urinalysis, Routine w reflex microscopic - Microalbumin / creatinine urine ratio - CBC with Differential/Platelet - COMPLETE METABOLIC PANEL WITH GFR - Magnesium - Lipid panel - TSH - Hemoglobin A1c - Insulin, random - VITAMIN D 25 Hydroxy         Patient was counseled in prudent diet to achieve/maintain BMI less than 25 for weight control, BP monitoring, regular exercise and medications. Discussed med's effects and SE's. Screening labs and tests as requested with regular follow-up as recommended. Over 40 minutes of exam, counseling, chart review and high complex critical decision making  was performed.   Kirtland Bouchard, MD

## 2019-08-23 ENCOUNTER — Ambulatory Visit: Payer: Medicare PPO | Admitting: Internal Medicine

## 2019-08-23 ENCOUNTER — Other Ambulatory Visit: Payer: Self-pay

## 2019-08-23 VITALS — BP 148/82 | HR 76 | Temp 97.0°F | Resp 16 | Ht 65.0 in | Wt 191.4 lb

## 2019-08-23 DIAGNOSIS — I10 Essential (primary) hypertension: Secondary | ICD-10-CM | POA: Diagnosis not present

## 2019-08-23 DIAGNOSIS — Z0001 Encounter for general adult medical examination with abnormal findings: Secondary | ICD-10-CM

## 2019-08-23 DIAGNOSIS — Z1211 Encounter for screening for malignant neoplasm of colon: Secondary | ICD-10-CM

## 2019-08-23 DIAGNOSIS — R7303 Prediabetes: Secondary | ICD-10-CM | POA: Diagnosis not present

## 2019-08-23 DIAGNOSIS — E782 Mixed hyperlipidemia: Secondary | ICD-10-CM

## 2019-08-23 DIAGNOSIS — R7309 Other abnormal glucose: Secondary | ICD-10-CM

## 2019-08-23 DIAGNOSIS — Z0000 Encounter for general adult medical examination without abnormal findings: Secondary | ICD-10-CM

## 2019-08-23 DIAGNOSIS — Z9989 Dependence on other enabling machines and devices: Secondary | ICD-10-CM

## 2019-08-23 DIAGNOSIS — Z8249 Family history of ischemic heart disease and other diseases of the circulatory system: Secondary | ICD-10-CM

## 2019-08-23 DIAGNOSIS — E559 Vitamin D deficiency, unspecified: Secondary | ICD-10-CM

## 2019-08-23 DIAGNOSIS — G4733 Obstructive sleep apnea (adult) (pediatric): Secondary | ICD-10-CM

## 2019-08-23 DIAGNOSIS — M158 Other polyosteoarthritis: Secondary | ICD-10-CM

## 2019-08-23 DIAGNOSIS — Z79899 Other long term (current) drug therapy: Secondary | ICD-10-CM | POA: Diagnosis not present

## 2019-08-23 DIAGNOSIS — Z87898 Personal history of other specified conditions: Secondary | ICD-10-CM

## 2019-08-23 DIAGNOSIS — Z136 Encounter for screening for cardiovascular disorders: Secondary | ICD-10-CM

## 2019-08-23 DIAGNOSIS — F3341 Major depressive disorder, recurrent, in partial remission: Secondary | ICD-10-CM

## 2019-08-24 LAB — CBC WITH DIFFERENTIAL/PLATELET
Absolute Monocytes: 437 cells/uL (ref 200–950)
Basophils Absolute: 38 cells/uL (ref 0–200)
Basophils Relative: 0.8 %
Eosinophils Absolute: 158 cells/uL (ref 15–500)
Eosinophils Relative: 3.3 %
HCT: 43.5 % (ref 35.0–45.0)
Hemoglobin: 14.6 g/dL (ref 11.7–15.5)
Lymphs Abs: 1608 cells/uL (ref 850–3900)
MCH: 30.1 pg (ref 27.0–33.0)
MCHC: 33.6 g/dL (ref 32.0–36.0)
MCV: 89.7 fL (ref 80.0–100.0)
MPV: 10.9 fL (ref 7.5–12.5)
Monocytes Relative: 9.1 %
Neutro Abs: 2558 cells/uL (ref 1500–7800)
Neutrophils Relative %: 53.3 %
Platelets: 323 10*3/uL (ref 140–400)
RBC: 4.85 10*6/uL (ref 3.80–5.10)
RDW: 13.1 % (ref 11.0–15.0)
Total Lymphocyte: 33.5 %
WBC: 4.8 10*3/uL (ref 3.8–10.8)

## 2019-08-24 LAB — LIPID PANEL
Cholesterol: 152 mg/dL (ref <200)
HDL: 52 mg/dL (ref >=50)
LDL Cholesterol (Calc): 77 mg/dL (calc)
Non-HDL Cholesterol (Calc): 100 mg/dL (calc) (ref <130)
Total CHOL/HDL Ratio: 2.9 (calc) (ref <5.0)
Triglycerides: 135 mg/dL (ref <150)

## 2019-08-24 LAB — URINALYSIS, ROUTINE W REFLEX MICROSCOPIC
Bilirubin Urine: NEGATIVE
Glucose, UA: NEGATIVE
Hgb urine dipstick: NEGATIVE
Ketones, ur: NEGATIVE
Leukocytes,Ua: NEGATIVE
Nitrite: NEGATIVE
Protein, ur: NEGATIVE
Specific Gravity, Urine: 1.014 (ref 1.001–1.03)
pH: >=8.5 — AB (ref 5.0–8.0)

## 2019-08-24 LAB — COMPLETE METABOLIC PANEL WITH GFR
AG Ratio: 1.6 (calc) (ref 1.0–2.5)
ALT: 25 U/L (ref 6–29)
AST: 19 U/L (ref 10–35)
Albumin: 4.4 g/dL (ref 3.6–5.1)
Alkaline phosphatase (APISO): 98 U/L (ref 37–153)
BUN: 16 mg/dL (ref 7–25)
CO2: 26 mmol/L (ref 20–32)
Calcium: 10.1 mg/dL (ref 8.6–10.4)
Chloride: 106 mmol/L (ref 98–110)
Creat: 0.67 mg/dL (ref 0.50–0.99)
GFR, Est African American: 109 mL/min/{1.73_m2} (ref >=60)
GFR, Est Non African American: 94 mL/min/{1.73_m2} (ref >=60)
Globulin: 2.8 g/dL (calc) (ref 1.9–3.7)
Glucose, Bld: 94 mg/dL (ref 65–99)
Potassium: 3.7 mmol/L (ref 3.5–5.3)
Sodium: 142 mmol/L (ref 135–146)
Total Bilirubin: 0.5 mg/dL (ref 0.2–1.2)
Total Protein: 7.2 g/dL (ref 6.1–8.1)

## 2019-08-24 LAB — HEMOGLOBIN A1C
Hgb A1c MFr Bld: 6.1 % of total Hgb — ABNORMAL HIGH (ref <5.7)
Mean Plasma Glucose: 128 (calc)
eAG (mmol/L): 7.1 (calc)

## 2019-08-24 LAB — MICROALBUMIN / CREATININE URINE RATIO
Creatinine, Urine: 55 mg/dL (ref 20–275)
Microalb Creat Ratio: 5 mcg/mg creat (ref <30)
Microalb, Ur: 0.3 mg/dL

## 2019-08-24 LAB — INSULIN, RANDOM: Insulin: 24.5 u[IU]/mL — ABNORMAL HIGH

## 2019-08-24 LAB — MAGNESIUM: Magnesium: 2.1 mg/dL (ref 1.5–2.5)

## 2019-08-24 LAB — TSH: TSH: 1.42 mIU/L (ref 0.40–4.50)

## 2019-08-24 LAB — VITAMIN D 25 HYDROXY (VIT D DEFICIENCY, FRACTURES): Vit D, 25-Hydroxy: 71 ng/mL (ref 30–100)

## 2019-09-06 DIAGNOSIS — H25813 Combined forms of age-related cataract, bilateral: Secondary | ICD-10-CM | POA: Diagnosis not present

## 2019-09-06 DIAGNOSIS — H2513 Age-related nuclear cataract, bilateral: Secondary | ICD-10-CM | POA: Diagnosis not present

## 2019-09-28 ENCOUNTER — Other Ambulatory Visit: Payer: Self-pay

## 2019-09-28 DIAGNOSIS — Z1212 Encounter for screening for malignant neoplasm of rectum: Secondary | ICD-10-CM | POA: Diagnosis not present

## 2019-09-28 DIAGNOSIS — Z1211 Encounter for screening for malignant neoplasm of colon: Secondary | ICD-10-CM

## 2019-09-28 LAB — POC HEMOCCULT BLD/STL (HOME/3-CARD/SCREEN)
Card #2 Fecal Occult Blod, POC: NEGATIVE
Card #3 Fecal Occult Blood, POC: NEGATIVE
Fecal Occult Blood, POC: NEGATIVE

## 2019-11-01 NOTE — Progress Notes (Signed)
Office Visit Note  Patient: [name]             Date of Birth: August 05, 1955           MRN: [id number]             PCP: Unk Pinto, MD Referring: Unk Pinto, MD Visit Date: 11/13/2019 Occupation: @GUAROCC @  Subjective:  New Patient (Initial Visit) (Bil hand pain and swelling)   History of Present Illness: [name] is a 64 y.o. female with history of osteoarthritis.  She states in 2014 she started having pain in her knee joints.  She underwent right total knee replacement in 2014 and left total knee replacement in 2020.  She has been experiencing pain in her hands for the last 3 to 4 years.  She also gives history of intermittent swelling in her hands.  She has been experiencing pain and discomfort in her bilateral feet top of her right foot has been painful.  She was diagnosed with fibromyalgia in 2004 and has been on different medications.  She states she takes tramadol for pain relief.  None of the other joints are painful.  There is no history of oral ulcers, nasal ulcers, sicca symptoms, Raynaud's phenomenon, lymphadenopathy.  There is history of rheumatoid arthritis in maternal aunt.  Activities of Daily Living:  Patient reports morning stiffness for 1 hour.   Patient Reports nocturnal pain.  Difficulty dressing/grooming: Denies Difficulty climbing stairs: Reports Difficulty getting out of chair: Reports Difficulty using hands for taps, buttons, cutlery, and/or writing: Reports  Review of Systems  Constitutional: Positive for fatigue. Negative for night sweats, weight gain and weight loss.  HENT: Positive for mouth dryness. Negative for mouth sores, trouble swallowing, trouble swallowing and nose dryness.   Eyes: Positive for dryness. Negative for pain, redness and visual disturbance.  Respiratory: Negative for cough, shortness of breath and difficulty breathing.   Cardiovascular: Negative for chest pain, palpitations, hypertension, irregular heartbeat and  swelling in legs/feet.  Gastrointestinal: Negative for blood in stool, constipation and diarrhea.  Endocrine: Positive for increased urination. Negative for excessive thirst.  Genitourinary: Negative for painful urination and vaginal dryness.  Musculoskeletal: Positive for arthralgias, joint pain, joint swelling, myalgias, morning stiffness and myalgias. Negative for muscle weakness and muscle tenderness.  Skin: Negative for color change, rash, hair loss, skin tightness, ulcers and sensitivity to sunlight.  Allergic/Immunologic: Negative for susceptible to infections.  Neurological: Positive for weakness. Negative for dizziness, memory loss and night sweats.  Hematological: Negative for bruising/bleeding tendency and swollen glands.  Psychiatric/Behavioral: Positive for depressed mood and sleep disturbance. The patient is not nervous/anxious.     PMFS History:  Patient Active Problem List   Diagnosis Date Noted  . History of prediabetes 05/23/2019  . Status post total shoulder arthroplasty, left 01/07/2019  . S/P total knee arthroplasty, left 01/07/2019  . Primary osteoarthritis of left knee 01/06/2019  . Osteoarthritis of both knees 03/20/2015  . Encounter for Medicare annual wellness exam 03/20/2015  . OSA on CPAP 08/15/2014  . Fibromyalgia 01/31/2014  . Depression, major, recurrent, in partial remission ([place]) 01/31/2014  . Morbid obesity ([place]) - BMI 30+ with OSA 01/31/2014  . Essential hypertension 08/09/2013  . Mixed hyperlipidemia 08/09/2013  . Vitamin D deficiency 08/09/2013    Past Medical History:  Diagnosis Date  . Arthritis   . Depression   . Elevated hemoglobin A1c   . Fibromyalgia   . Hypertension   . Other abnormal glucose 08/09/2013  .  Sleep apnea    cpap   > 4 yrs  last sleep study  . Unspecified essential hypertension 08/09/2013  . Unspecified vitamin D deficiency 08/09/2013  . Vitamin D deficiency     Family History  Problem Relation Age of Onset  .  Hypertension Mother   . Heart attack Mother   . Hypertension Father   . COPD Father   . Cancer Father        thyroid  . CVA Maternal Grandmother 78  . Heart attack Maternal Grandfather 59  . Deep vein thrombosis Maternal Grandfather   . Colon cancer Neg Hx   . Esophageal cancer Neg Hx   . Stomach cancer Neg Hx    Past Surgical History:  Procedure Laterality Date  . ABDOMINAL HYSTERECTOMY  2002   Dr. Brien Mates   . COLONOSCOPY  2009  . JOINT REPLACEMENT     arthroscopy  . KNEE ARTHROPLASTY    . TOTAL KNEE ARTHROPLASTY Right 04/14/2013   Procedure: RIGHT TOTAL KNEE ARTHROPLASTY AND LEFT KNEE INJECTION ;  Surgeon: Alta Corning, MD;  Location: [facility];  Service: Orthopedics;  Laterality: Right;  . TOTAL KNEE ARTHROPLASTY Left 01/06/2019   Procedure: TOTAL KNEE ARTHROPLASTY;  Surgeon: Dorna Leitz, MD;  Location: [facility];  Service: Orthopedics;  Laterality: Left;   Social History   Social History Narrative  . Not on file   Immunization History  Administered Date(s) Administered  . DT (Pediatric) 07/05/2015  . Influenza Inj Mdck Quad With Preservative 05/28/2017, 04/13/2018  . Influenza Split 04/06/2012, 04/05/2013, 05/16/2014, 03/21/2015  . Influenza,inj,Quad PF,6+ Mos 03/16/2019  . Influenza,inj,quad, With Preservative 04/29/2016  . Pneumococcal Conjugate-13 05/16/2014  . Pneumococcal-Unspecified 06/22/1998  . Td 06/22/2004  . Zoster 10/21/2015     Objective: Vital Signs: BP 118/76 (BP Location: Right Arm, Patient Position: Sitting, Cuff Size: Normal)   Pulse 87   Resp 18   Ht 5\' 5"  (1.651 m)   Wt 199 lb 3.2 oz (90.4 kg)   BMI 33.15 kg/m    Physical Exam Vitals and nursing note reviewed.  Constitutional:      Appearance: She is well-developed.  HENT:     Head: Normocephalic and atraumatic.  Eyes:     Conjunctiva/sclera: Conjunctivae normal.  Cardiovascular:     Rate and Rhythm: Normal rate and regular rhythm.     Heart sounds: Normal heart sounds.  Pulmonary:      Effort: Pulmonary effort is normal.     Breath sounds: Normal breath sounds.  Abdominal:     General: Bowel sounds are normal.     Palpations: Abdomen is soft.  Musculoskeletal:     Cervical back: Normal range of motion.  Lymphadenopathy:     Cervical: No cervical adenopathy.  Skin:    General: Skin is warm and dry.     Capillary Refill: Capillary refill takes less than 2 seconds.  Neurological:     Mental Status: She is alert and oriented to person, place, and time.  Psychiatric:        Behavior: Behavior normal.      Musculoskeletal Exam: C-spine was in good range of motion.  She had no discomfort range of motion of the lumbar spine.  Shoulder joints, elbow joints, wrist joints with good range of motion.  She has bilateral DIP and PIP thickening.  No synovitis was noted.  Hip joints with good range of motion.  Bilateral knee joints are replaced.  She had right dorsal spurring.  Some MCP and  PIP prominence was noted without any synovitis.  CDAI Exam: CDAI Score: -- Patient Global: --; Provider Global: -- Swollen: --; Tender: -- Joint Exam 11/13/2019   No joint exam has been documented for this visit   There is currently no information documented on the homunculus. Go to the Rheumatology activity and complete the homunculus joint exam.  Investigation: No additional findings.  Imaging: No results found.  Recent Labs: Lab Results  Component Value Date   WBC 4.8 08/23/2019   HGB 14.6 08/23/2019   PLT 323 08/23/2019   NA 142 08/23/2019   K 3.7 08/23/2019   CL 106 08/23/2019   CO2 26 08/23/2019   GLUCOSE 94 08/23/2019   BUN 16 08/23/2019   CREATININE 0.67 08/23/2019   BILITOT 0.5 08/23/2019   ALKPHOS 89 01/03/2019   AST 19 08/23/2019   ALT 25 08/23/2019   PROT 7.2 08/23/2019   ALBUMIN 4.7 01/03/2019   CALCIUM 10.1 08/23/2019   GFRAA 109 08/23/2019    Speciality Comments: No specialty comments available.  Procedures:  No procedures performed Allergies: Latex,  Celecoxib, Cephalexin, Codeine, Erythromycin, Sulfonamide derivatives, Trazodone and nefazodone, and Venlafaxine   Assessment / Plan:     Visit Diagnoses: Pain in both hands -she has pain and discomfort in her bilateral hands.  She gives history of intermittent swelling.  I do not see any synovitis.  Plan: XR Hand 2 View Right, XR Hand 2 View Left, Sedimentation rate, Uric acid, Rheumatoid factor, Cyclic citrul peptide antibody, IgG.  Have given her a handout on hand exercises.  Joint protection muscle strengthening was discussed.  Pain in both feet- she is discomfort in her bilateral feet.  She has a dorsal spur on her right foot.  Plan: XR Foot 2 Views Right, XR Foot 2 Views Left.  Proper fitting shoes were discussed.  Status post total bilateral knee replacement-she had bilateral total knee replacement and has been doing well.  Fibromyalgia-she has history of fibromyalgia for many years.  She continues to have generalized pain and discomfort.  Essential hypertension-blood pressure is well controlled.  Other medical problems are listed as follows:  Mixed hyperlipidemia  History of prediabetes  Depression, major, recurrent, in partial remission ([place])  Vitamin D deficiency  OSA on CPAP  Orders: Orders Placed This Encounter  Procedures  . XR Hand 2 View Right  . XR Hand 2 View Left  . XR Foot 2 Views Right  . XR Foot 2 Views Left  . Sedimentation rate  . Uric acid  . Rheumatoid factor  . Cyclic citrul peptide antibody, IgG   No orders of the defined types were placed in this encounter.   Face-to-face time spent with patient was 45 minutes. Greater than 50% of time was spent in counseling and coordination of care.  Follow-Up Instructions: Return for Osteoarthritis, polyarthralgia.   Bo Merino, MD  Note - This record has been created using Editor, commissioning.  Chart creation errors have been sought, but may not always  have been located. Such creation errors do not  reflect on  the standard of medical care.

## 2019-11-11 ENCOUNTER — Other Ambulatory Visit: Payer: Self-pay | Admitting: Internal Medicine

## 2019-11-11 MED ORDER — HYDROCHLOROTHIAZIDE 25 MG PO TABS: ORAL_TABLET | ORAL | 1 refills | Status: DC

## 2019-11-13 ENCOUNTER — Ambulatory Visit: Payer: Self-pay

## 2019-11-13 ENCOUNTER — Other Ambulatory Visit: Payer: Self-pay

## 2019-11-13 ENCOUNTER — Ambulatory Visit: Payer: Medicare PPO | Admitting: Rheumatology

## 2019-11-13 ENCOUNTER — Encounter: Payer: Self-pay | Admitting: Rheumatology

## 2019-11-13 VITALS — BP 118/76 | HR 87 | Resp 18 | Ht 65.0 in | Wt 199.2 lb

## 2019-11-13 DIAGNOSIS — Z96653 Presence of artificial knee joint, bilateral: Secondary | ICD-10-CM

## 2019-11-13 DIAGNOSIS — I10 Essential (primary) hypertension: Secondary | ICD-10-CM | POA: Diagnosis not present

## 2019-11-13 DIAGNOSIS — M79642 Pain in left hand: Secondary | ICD-10-CM | POA: Diagnosis not present

## 2019-11-13 DIAGNOSIS — Z87898 Personal history of other specified conditions: Secondary | ICD-10-CM | POA: Diagnosis not present

## 2019-11-13 DIAGNOSIS — M79672 Pain in left foot: Secondary | ICD-10-CM

## 2019-11-13 DIAGNOSIS — G4733 Obstructive sleep apnea (adult) (pediatric): Secondary | ICD-10-CM

## 2019-11-13 DIAGNOSIS — M79641 Pain in right hand: Secondary | ICD-10-CM | POA: Diagnosis not present

## 2019-11-13 DIAGNOSIS — E782 Mixed hyperlipidemia: Secondary | ICD-10-CM

## 2019-11-13 DIAGNOSIS — M797 Fibromyalgia: Secondary | ICD-10-CM | POA: Diagnosis not present

## 2019-11-13 DIAGNOSIS — E559 Vitamin D deficiency, unspecified: Secondary | ICD-10-CM

## 2019-11-13 DIAGNOSIS — M79671 Pain in right foot: Secondary | ICD-10-CM | POA: Diagnosis not present

## 2019-11-13 DIAGNOSIS — F3341 Major depressive disorder, recurrent, in partial remission: Secondary | ICD-10-CM

## 2019-11-13 DIAGNOSIS — Z9989 Dependence on other enabling machines and devices: Secondary | ICD-10-CM

## 2019-11-13 NOTE — Patient Instructions (Signed)
Hand Exercises Hand exercises can be helpful for almost anyone. These exercises can strengthen the hands, improve flexibility and movement, and increase blood flow to the hands. These results can make work and daily tasks easier. Hand exercises can be especially helpful for people who have joint pain from arthritis or have nerve damage from overuse (carpal tunnel syndrome). These exercises can also help people who have injured a hand. Exercises Most of these hand exercises are gentle stretching and motion exercises. It is usually safe to do them often throughout the day. Warming up your hands before exercise may help to reduce stiffness. You can do this with gentle massage or by placing your hands in warm water for 10-15 minutes. It is normal to feel some stretching, pulling, tightness, or mild discomfort as you begin new exercises. This will gradually improve. Stop an exercise right away if you feel sudden, severe pain or your pain gets worse. Ask your health care provider which exercises are best for you. Knuckle bend or "claw" fist 1. Stand or sit with your arm, hand, and all five fingers pointed straight up. Make sure to keep your wrist straight during the exercise. 2. Gently bend your fingers down toward your palm until the tips of your fingers are touching the top of your palm. Keep your big knuckle straight and just bend the small knuckles in your fingers. 3. Hold this position for __________ seconds. 4. Straighten (extend) your fingers back to the starting position. Repeat this exercise 5-10 times with each hand. Full finger fist 1. Stand or sit with your arm, hand, and all five fingers pointed straight up. Make sure to keep your wrist straight during the exercise. 2. Gently bend your fingers into your palm until the tips of your fingers are touching the middle of your palm. 3. Hold this position for __________ seconds. 4. Extend your fingers back to the starting position, stretching every  joint fully. Repeat this exercise 5-10 times with each hand. Straight fist 1. Stand or sit with your arm, hand, and all five fingers pointed straight up. Make sure to keep your wrist straight during the exercise. 2. Gently bend your fingers at the big knuckle, where your fingers meet your hand, and the middle knuckle. Keep the knuckle at the tips of your fingers straight and try to touch the bottom of your palm. 3. Hold this position for __________ seconds. 4. Extend your fingers back to the starting position, stretching every joint fully. Repeat this exercise 5-10 times with each hand. Tabletop 1. Stand or sit with your arm, hand, and all five fingers pointed straight up. Make sure to keep your wrist straight during the exercise. 2. Gently bend your fingers at the big knuckle, where your fingers meet your hand, as far down as you can while keeping the small knuckles in your fingers straight. Think of forming a tabletop with your fingers. 3. Hold this position for __________ seconds. 4. Extend your fingers back to the starting position, stretching every joint fully. Repeat this exercise 5-10 times with each hand. Finger spread 1. Place your hand flat on a table with your palm facing down. Make sure your wrist stays straight as you do this exercise. 2. Spread your fingers and thumb apart from each other as far as you can until you feel a gentle stretch. Hold this position for __________ seconds. 3. Bring your fingers and thumb tight together again. Hold this position for __________ seconds. Repeat this exercise 5-10 times with each hand.   Making circles 1. Stand or sit with your arm, hand, and all five fingers pointed straight up. Make sure to keep your wrist straight during the exercise. 2. Make a circle by touching the tip of your thumb to the tip of your index finger. 3. Hold for __________ seconds. Then open your hand wide. 4. Repeat this motion with your thumb and each finger on your  hand. Repeat this exercise 5-10 times with each hand. Thumb motion 1. Sit with your forearm resting on a table and your wrist straight. Your thumb should be facing up toward the ceiling. Keep your fingers relaxed as you move your thumb. 2. Lift your thumb up as high as you can toward the ceiling. Hold for __________ seconds. 3. Bend your thumb across your palm as far as you can, reaching the tip of your thumb for the small finger (pinkie) side of your palm. Hold for __________ seconds. Repeat this exercise 5-10 times with each hand. Grip strengthening  1. Hold a stress ball or other soft ball in the middle of your hand. 2. Slowly increase the pressure, squeezing the ball as much as you can without causing pain. Think of bringing the tips of your fingers into the middle of your palm. All of your finger joints should bend when doing this exercise. 3. Hold your squeeze for __________ seconds, then relax. Repeat this exercise 5-10 times with each hand. Contact a health care provider if:  Your hand pain or discomfort gets much worse when you do an exercise.  Your hand pain or discomfort does not improve within 2 hours after you exercise. If you have any of these problems, stop doing these exercises right away. Do not do them again unless your health care provider says that you can. Get help right away if:  You develop sudden, severe hand pain or swelling. If this happens, stop doing these exercises right away. Do not do them again unless your health care provider says that you can. This information is not intended to replace advice given to you by your health care provider. Make sure you discuss any questions you have with your health care provider. Document Revised: 09/29/2018 Document Reviewed: 06/09/2018 Elsevier Patient Education  2020 [employer]  

## 2019-11-14 LAB — URIC ACID: Uric Acid, Serum: 6.1 mg/dL (ref 2.5–7.0)

## 2019-11-14 LAB — CYCLIC CITRUL PEPTIDE ANTIBODY, IGG: Cyclic Citrullin Peptide Ab: 39 UNITS — ABNORMAL HIGH

## 2019-11-14 LAB — SEDIMENTATION RATE: Sed Rate: 6 mm/h (ref 0–30)

## 2019-11-14 LAB — RHEUMATOID FACTOR: Rheumatoid fact SerPl-aCnc: <14 IU/mL (ref <14)

## 2019-11-14 NOTE — Progress Notes (Signed)
Anti-CCP is weak positive.  She has been having intermittent swelling in her hands.  I will discuss results at the follow-up visit.

## 2019-11-16 DIAGNOSIS — G4733 Obstructive sleep apnea (adult) (pediatric): Secondary | ICD-10-CM | POA: Diagnosis not present

## 2019-11-21 NOTE — Progress Notes (Signed)
Office Visit Note  Patient: [name]             Date of Birth: [birth year]           MRN: [id number]             PCP: Unk Pinto, MD Referring: Unk Pinto, MD Visit Date: 12/04/2019 Occupation: _0 @  Subjective:  Pain in both hands.   History of Present Illness: [name] is a 64 y.o. female with history of osteoarthritis and weak positive anti-CCP.  Since last visit on 11/13/19, patient has continued to experience pain and swelling in bilateral hands.  She is also concerned that she may have a bone spur on her left middle finger.  Activities of Daily Living:  Patient reports morning stiffness for  30-60  minutes.   Patient Reports nocturnal pain.  Difficulty dressing/grooming: Denies Difficulty climbing stairs: Reports Difficulty getting out of chair: Denies Difficulty using hands for taps, buttons, cutlery, and/or writing: Denies  Review of Systems  Constitutional: Negative for fatigue, night sweats, weight gain and weight loss.  HENT: Positive for mouth dryness. Negative for mouth sores, trouble swallowing, trouble swallowing and nose dryness.   Eyes: Positive for itching and dryness. Negative for pain, redness and visual disturbance.  Respiratory: Negative for cough, shortness of breath and difficulty breathing.   Cardiovascular: Negative for chest pain, palpitations, hypertension, irregular heartbeat and swelling in legs/feet.  Gastrointestinal: Negative for blood in stool, constipation and diarrhea.  Endocrine: Negative for increased urination.  Genitourinary: Negative for difficulty urinating and vaginal dryness.  Musculoskeletal: Positive for arthralgias, joint pain, joint swelling, myalgias, morning stiffness and myalgias. Negative for muscle weakness and muscle tenderness.  Skin: Negative for color change, rash, hair loss, redness, skin tightness, ulcers and sensitivity to sunlight.  Allergic/Immunologic: Negative for susceptible to  infections.  Neurological: Positive for headaches. Negative for dizziness, numbness, memory loss, night sweats and weakness.  Hematological: Negative for bruising/bleeding tendency and swollen glands.  Psychiatric/Behavioral: Negative for depressed mood, confusion and sleep disturbance. The patient is not nervous/anxious.     PMFS History:  Patient Active Problem List   Diagnosis Date Noted  . Right foot pain 11/29/2019  . History of prediabetes 05/23/2019  . Osteoarthritis of both knees 03/20/2015  . Encounter for Medicare annual wellness exam 03/20/2015  . OSA on CPAP 08/15/2014  . Fibromyalgia 01/31/2014  . Depression, major, recurrent, in partial remission ([place]) 01/31/2014  . Morbid obesity ([place]) - BMI 30+ with OSA 01/31/2014  . Essential hypertension 08/09/2013  . Mixed hyperlipidemia 08/09/2013  . Vitamin D deficiency 08/09/2013    Past Medical History:  Diagnosis Date  . Arthritis   . Depression   . Elevated hemoglobin A1c   . Fibromyalgia   . Hypertension   . Other abnormal glucose 08/09/2013  . S/P total knee arthroplasty, left 01/07/2019  . Sleep apnea    cpap   > 4 yrs  last sleep study  . Status post total shoulder arthroplasty, left 01/07/2019  . Unspecified essential hypertension 08/09/2013  . Unspecified vitamin D deficiency 08/09/2013  . Vitamin D deficiency     Family History  Problem Relation Age of Onset  . Hypertension Mother   . Heart attack Mother   . Hypertension Father   . COPD Father   . Cancer Father        thyroid  . CVA Maternal Grandmother 11  . Heart attack Maternal Grandfather 59  . Deep vein thrombosis Maternal Grandfather   .  Colon cancer Neg Hx   . Esophageal cancer Neg Hx   . Stomach cancer Neg Hx    Past Surgical History:  Procedure Laterality Date  . ABDOMINAL HYSTERECTOMY  2002   Dr. Brien Mates   . COLONOSCOPY  2009  . JOINT REPLACEMENT     arthroscopy  . KNEE ARTHROPLASTY    . TOTAL KNEE ARTHROPLASTY Right 04/14/2013    Procedure: RIGHT TOTAL KNEE ARTHROPLASTY AND LEFT KNEE INJECTION ;  Surgeon: Alta Corning, MD;  Location: [facility];  Service: Orthopedics;  Laterality: Right;  . TOTAL KNEE ARTHROPLASTY Left 01/06/2019   Procedure: TOTAL KNEE ARTHROPLASTY;  Surgeon: Dorna Leitz, MD;  Location: [facility];  Service: Orthopedics;  Laterality: Left;   Social History   Social History Narrative  . Not on file   Immunization History  Administered Date(s) Administered  . DT (Pediatric) 07/05/2015  . Influenza Inj Mdck Quad With Preservative 05/28/2017, 04/13/2018  . Influenza Split 04/06/2012, 04/05/2013, 05/16/2014, 03/21/2015  . Influenza,inj,Quad PF,6+ Mos 03/16/2019  . Influenza,inj,quad, With Preservative 04/29/2016  . Pneumococcal Conjugate-13 05/16/2014  . Pneumococcal-Unspecified 06/22/1998  . Td 06/22/2004  . Zoster 10/21/2015     Objective: Vital Signs: BP 138/75 (BP Location: Left Arm, Patient Position: Sitting, Cuff Size: Normal)   Pulse 94   Resp 15   Ht _0  (1.651 m)   Wt 199 lb 6.4 oz (90.4 kg)   BMI 33.18 kg/m    Physical Exam Vitals and nursing note reviewed.  Constitutional:      Appearance: She is well-developed.  HENT:     Head: Normocephalic and atraumatic.  Eyes:     Conjunctiva/sclera: Conjunctivae normal.  Cardiovascular:     Rate and Rhythm: Normal rate and regular rhythm.     Heart sounds: Normal heart sounds.  Pulmonary:     Effort: Pulmonary effort is normal.     Breath sounds: Normal breath sounds.  Abdominal:     General: Bowel sounds are normal.     Palpations: Abdomen is soft.  Musculoskeletal:     Cervical back: Normal range of motion.  Lymphadenopathy:     Cervical: No cervical adenopathy.  Skin:    General: Skin is warm and dry.     Capillary Refill: Capillary refill takes less than 2 seconds.  Neurological:     Mental Status: She is alert and oriented to person, place, and time.  Psychiatric:        Behavior: Behavior normal.      Musculoskeletal  Exam: C-spine was in good range of motion.  Shoulder joints, elbow joints, wrist joints with good range of motion.  She has tenderness on palpation of bilateral wrist joints with some swelling.  She has tenderness across all of her MCPs.  She has DIP and PIP thickening.  Hip joints are in good range of motion.  Her both knee joints are replaced.  Ankle joints MTPs PIPs with good range of motion with no synovitis.  CDAI Exam: CDAI Score: 9.4  Patient Global: 2 mm; Provider Global: 2 mm Swollen: 0 ; Tender: 9  Joint Exam 12/04/2019      Right  Left  Wrist   Tender   Tender  MCP 2   Tender   Tender  MCP 3   Tender   Tender  MCP 4   Tender   Tender  MCP 5   Tender        Investigation: No additional findings.  Imaging: XR Foot 2 Views Left  Result Date: 11/13/2019 PIP and DIP narrowing was noted.  First MTP narrowing was noted.  Significant dorsal spurring was noted.  Inferior calcaneal spur was noted. Impression: These findings are consistent with osteoarthritis of the foot.  XR Foot 2 Views Right  Result Date: 11/13/2019 PIP and DIP narrowing was noted.  Dorsal spurring was noted.  Inferior calcaneal spur was noted.  Mild first MTP narrowing was noted.  No erosive changes were noted. Impression: These findings are consistent with osteoarthritis of the foot.  XR Hand 2 View Left  Result Date: 11/13/2019 PIP and DIP narrowing was noted.  CMC narrowing was noted.  No MCP, intercarpal or radiocarpal joint space narrowing was noted. Impression: These findings are consistent with osteoarthritis of the hand.  XR Hand 2 View Right  Result Date: 11/13/2019 PIP and DIP narrowing was noted.  CMC narrowing was noted.  No MCP, intercarpal or radiocarpal joint space narrowing was noted. Impression: These findings are consistent with osteoarthritis of the hand.   Recent Labs: Lab Results  Component Value Date   WBC 4.8 11/29/2019   HGB 14.7 11/29/2019   PLT 305 11/29/2019   NA 140  11/29/2019   K 3.9 11/29/2019   CL 101 11/29/2019   CO2 27 11/29/2019   GLUCOSE 96 11/29/2019   BUN 18 11/29/2019   CREATININE 0.63 11/29/2019   BILITOT 0.6 11/29/2019   ALKPHOS 89 01/03/2019   AST 27 11/29/2019   ALT 40 (H) 11/29/2019   PROT 7.3 11/29/2019   ALBUMIN 4.7 01/03/2019   CALCIUM 10.2 11/29/2019   GFRAA 111 11/29/2019   Nov 13, 2019 ESR 6, uric acid 6.1, RF negative, anti-CCP 39 (weak positive) Speciality Comments: No specialty comments available.  Procedures:  No procedures performed Allergies: Latex, Celecoxib, Cephalexin, Codeine, Erythromycin, Sulfonamide derivatives, Trazodone and nefazodone, and Venlafaxine   Assessment / Plan:     Visit Diagnoses: Rheumatoid arthritis of multiple sites with negative rheumatoid factor ([place]) - RF-, +anti-CCP.  Patient has significant pain and discomfort in bilateral wrist joints and hands over MCPs.  She has been experiencing nocturnal pain.  She has intermittent swelling in her MCPs and wrist joints.  Different treatment options and their side effects were discussed.  We are detailed discussion regarding the use of Plaquenil.  Indications side effects contraindications were discussed.  She had mild sulfa allergy in the past.  We have advised her to try a lower dose.  She should keep Benadryl with her in case she has a reaction.  Her dose will be Plaquenil 200 mg p.o. twice daily.  She will get labs in a month and then every 3 months to monitor for drug toxicity.  She been also advised to get eye examination on yearly basis.  Positive anti-CCP test - Anti-CCP 39 (weak positive), RF negative, ESR 6.  Patient gives history of intermittent swelling.  High risk medication use-she will be starting Plaquenil.  Primary osteoarthritis of both hands-joint protection muscle strengthening was discussed.  Primary osteoarthritis of both feet-use of proper fitting shoes were discussed.  Status post total bilateral knee replacement-she has chronic  discomfort and some problems with mobility.  Fibromyalgia-she has generalized pain and discomfort.  Essential hypertension-her blood pressure is well controlled.  Mixed hyperlipidemia  History of prediabetes  Depression, major, recurrent, in partial remission ([facility])  Vitamin D deficiency  OSA on CPAP  Orders: Orders Placed This Encounter  Procedures  . CBC with Differential/Platelet  . COMPLETE METABOLIC PANEL WITH GFR   Meds  ordered this encounter  Medications  . hydroxychloroquine (PLAQUENIL) 200 MG tablet    Sig: Take 1 tablet (200 mg total) by mouth daily.    Dispense:  60 tablet    Refill:  2    Order Specific Question:   Supervising Provider    Answer:   Bo Merino [[id number]]      Follow-Up Instructions: Return in about 6 weeks (around 01/15/2020) for Rheumatoid arthritis, Osteoarthritis.   Bo Merino, MD  Note - This record has been created using Editor, commissioning.  Chart creation errors have been sought, but may not always  have been located. Such creation errors do not reflect on  the standard of medical care.

## 2019-11-23 ENCOUNTER — Ambulatory Visit: Payer: Medicare PPO | Admitting: Adult Health

## 2019-11-28 ENCOUNTER — Encounter: Payer: Self-pay | Admitting: Adult Health

## 2019-11-28 NOTE — Progress Notes (Signed)
MEDICARE ANNUAL WELLNESS VISIT AND FOLLOW UP  Assessment:   Diagnoses and all orders for this visit:  Encounter for Medicare annual wellness exam Getting second covid 19 vaccine today - record requested Otherwise UTD  Essential hypertension Continue medications Monitor blood pressure at home; call if consistently over 130/80 Continue DASH diet.   Reminder to go to the ER if any CP, SOB, nausea, dizziness, severe HA, changes vision/speech, left arm numbness and tingling and jaw pain.  Vitamin D deficiency At goal at recent check; continue to recommend supplementation for goal of 60-100 Defer vitamin D level  Prediabetes Discussed disease and risks Discussed diet/exercise, weight management  A1C q94m;   Morbid obesity - BMI 32 with OSA Long discussion about weight loss, diet, and exercise Recommended diet heavy in fruits and veggies and low in animal meats, cheeses, and dairy products, appropriate calorie intake Will start the patient on phentermine- hand out given and AE's discussed, will do close follow up. Return in 4 weeks.  Follow up at next visit  Mixed hyperlipidemia Continue medications: crestor Continue low cholesterol diet and exercise.  Check lipid panel.   Medication management CBC, CMP/GFR  Fibromyalgia Lifestyle discussed: diet/exerise, sleep hygiene, stress management, hydration Continue current medications for sleep management  Depression, major, recurrent, in partial remission ([place]) Continue medications  Lifestyle discussed: diet/exerise, sleep hygiene, stress management, hydration  OSA on CPAP CPAP - reporting 100% compliance with improved restorative sleep and no excessive daytime sleepiness.   Primary osteoarthritis of both knees Followed by Dr. Berenice Primas, s/p bil TKA and doing well   R foot pain Pending workup by Dr. Estanislado Pandy; taking tylenol to manage Consider podiatry if no recommendations by reum   Over 40 minutes of exam, counseling,  chart review and critical decision making was performed Future Appointments  Date Time Provider Protection  12/04/2019 10:30 AM Bo Merino, MD [facility] None  01/03/2020 11:00 AM Vicie Mutters, PA-C [facility] None  03/04/2020 11:30 AM Unk Pinto, MD [facility] None  09/02/2020 11:00 AM Unk Pinto, MD [facility] None     Plan:   During the course of the visit the patient was educated and counseled about appropriate screening and preventive services including:    Pneumococcal vaccine   Prevnar 13  Influenza vaccine  Td vaccine  Screening electrocardiogram  Bone densitometry screening  Colorectal cancer screening  Diabetes screening  Glaucoma screening  Nutrition counseling   Advanced directives: requested   Subjective:  [name] is a 64 y.o. female who presents for Medicare Annual Wellness Visit and 3 month follow up.   Patient has been on SS Disability since age 75 (2007) for Panic, Anxiety & Depression, and also has fibromyalgia. She is followed by PA Gareth Eagle for mental health management. She reports she is doing very well at this time.   She currently uses klonopin very rarely to help with sleep (1-2 tabs per month). This is managed by mental health provider.   OSA on CPAP with reported 100% compliance and endorses restorative sleep.   She is followed by Dr. Berenice Primas for arthritis; s/p bil TKA. Newly seeing Dr. Estanislado Pandy for R foot pain, bil hands, pending lab workup.Taking tylenol to help managed -   BMI is Body mass index is 32.78 kg/m., she has been working on diet, exercise limited due to knee pain, she is cutting down on junk food, down to 188 lb on home scale, initial goal is <180 lb.  Wt Readings from Last 3 Encounters:  11/29/19  197 lb (89.4 kg)  11/13/19 199 lb 3.2 oz (90.4 kg)  08/23/19 191 lb 6.4 oz (86.8 kg)    Her blood pressure has been controlled at home, today their BP is BP: 120/78 She does not workout. She  denies chest pain, shortness of breath, dizziness.   She is on cholesterol medication (crestor 20 mg MWF) and denies myalgias. Her LDL cholesterol is at goal. The cholesterol last visit was:   Lab Results  Component Value Date   CHOL 152 08/23/2019   HDL 52 08/23/2019   LDLCALC 77 08/23/2019   TRIG 135 08/23/2019   CHOLHDL 2.9 08/23/2019    She has not been working on diet and exercise for prediabetes, and denies foot ulcerations, increased appetite, nausea, paresthesia of the feet, polydipsia, polyuria, visual disturbances, vomiting and weight loss. Last A1C in the office was:  Lab Results  Component Value Date   HGBA1C 6.1 (H) 08/23/2019   Last GFR: Lab Results  Component Value Date   GFRNONAA 94 08/23/2019   Patient is on Vitamin D supplement and at goal at recent check:   Lab Results  Component Value Date   VD25OH 71 08/23/2019      Medication Review: Current Outpatient Medications on File Prior to Visit  Medication Sig Dispense Refill  . aspirin EC 81 MG tablet Take 81 mg by mouth daily.    . cetirizine (ZYRTEC) 10 MG tablet Take 10 mg by mouth daily.     . Cholecalciferol (VITAMIN D PO) Take 8,000 Int'l Units by mouth daily. 2000 units per capsule    . clonazePAM (KLONOPIN) 1 MG tablet Take 1 mg by mouth at bedtime as needed for anxiety.     . fluticasone (FLONASE) 50 MCG/ACT nasal spray Use 1 to 2 sprays each nostril 1 to 2 x /day (Patient taking differently: Place 1 spray into both nostrils daily as needed for allergies. Use 1 to 2 sprays each nostril 1 to 2 x /day) 48 g 3  . hydrochlorothiazide (HYDRODIURIL) 25 MG tablet Take 1 tablet Daily for BP & Fluid Retention / Ankle Swelling 90 tablet 1  . lamoTRIgine (LAMICTAL) 150 MG tablet Take 1 & 1/2 tablets at bedtime (Patient taking differently: Take 150 mg by mouth at bedtime. ) 135 tablet 1  . Lancets (ACCU-CHEK MULTICLIX) lancets Use to check blood glucose daily 100 each PRN  . Lysine 500 MG TABS Take 1,000 mg by mouth  daily. Currently taking 2 pill QD    . Magnesium 500 MG TABS Take 3,000 mg by mouth 3 (three) times daily. Takes 1000mg  TID    . Multiple Vitamin (MULTIVITAMIN WITH MINERALS) TABS tablet Take 0.5 tablets by mouth 2 (two) times daily.    . QUEtiapine (SEROQUEL) 100 MG tablet Take 100 mg by mouth 3 (three) times daily.    . rosuvastatin (CRESTOR) 40 MG tablet Take 1 tablet Daily for Cholesterol 90 tablet 3  . traMADol (ULTRAM) 50 MG tablet Use 1 to 2 tablets every 4 hours as needed for Severe Pain 30 tablet 0   No current facility-administered medications on file prior to visit.    Allergies  Allergen Reactions  . Latex     Band aids= if left on for extended period of time  . Celecoxib     REACTION: Rash (celebrex)  . Cephalexin     REACTION: Rash (keflex)  . Codeine     REACTION: Rash  . Erythromycin     REACTION: Rash (emycin)  .  Sulfonamide Derivatives     REACTION: Rash  . Trazodone And Nefazodone     insomnia  . Venlafaxine     REACTION: Blurred Vision Insurance claims handler)    Current Problems (verified) Patient Active Problem List   Diagnosis Date Noted  . Right foot pain 11/29/2019  . History of prediabetes 05/23/2019  . Osteoarthritis of both knees 03/20/2015  . Encounter for Medicare annual wellness exam 03/20/2015  . OSA on CPAP 08/15/2014  . Fibromyalgia 01/31/2014  . Depression, major, recurrent, in partial remission ([place]) 01/31/2014  . Morbid obesity ([place]) - BMI 30+ with OSA 01/31/2014  . Essential hypertension 08/09/2013  . Mixed hyperlipidemia 08/09/2013  . Vitamin D deficiency 08/09/2013    Screening Tests Immunization History  Administered Date(s) Administered  . DT (Pediatric) 07/05/2015  . Influenza Inj Mdck Quad With Preservative 05/28/2017, 04/13/2018  . Influenza Split 04/06/2012, 04/05/2013, 05/16/2014, 03/21/2015  . Influenza,inj,Quad PF,6+ Mos 03/16/2019  . Influenza,inj,quad, With Preservative 04/29/2016  . Pneumococcal Conjugate-13 05/16/2014  .  Pneumococcal-Unspecified 06/22/1998  . Td 06/22/2004  . Zoster 10/21/2015   Preventative care: Last colonoscopy: 2019 due 11/2027 Mammogram:  05/2019 DEXA: 2013 normal - would like to postpone Pap:  S/p Hysterectomy, DONE   Prior vaccinations:  TD or Tdap: 2017 Influenza: 2020 Pneumococcal: 2000 Prevnar13: 2015 Shingles/Zostavax: 2017 Covid 19: 2/2, 2021, pfizer - second shot today   Names of Other Physician/Practitioners you currently use: 1. [facility] here for primary care 2. Dr. Sabra Heck, eye doctor, last visit 2021, glasses, goes annually, monitoring mild cataracts  3. Dr. Bosie Helper, dentist, last visit 2020, goes q51m, has scheduled in August    Patient Care Team: Unk Pinto, MD as PCP - General (Internal Medicine) Dorna Leitz, MD as Consulting Physician (Orthopedic Surgery) Milus Banister, MD as Attending Physician (Gastroenterology) Sheralyn Boatman, MD as Consulting Physician (Psychiatry) [name], [place] (Optometry) [name], DDS as Referring Physician (Dentistry)  SURGICAL HISTORY She  has a past surgical history that includes Abdominal hysterectomy (2002); Joint replacement; Total knee arthroplasty (Right, 04/14/2013); Colonoscopy (2009); Total knee arthroplasty (Left, 01/06/2019); and Knee Arthroplasty. FAMILY HISTORY Her family history includes COPD in her father; CVA (age of onset: 34) in her maternal grandmother; Cancer in her father; Deep vein thrombosis in her maternal grandfather; Heart attack in her mother; Heart attack (age of onset: 63) in her maternal grandfather; Hypertension in her father and mother. SOCIAL HISTORY She  reports that she has never smoked. She has never used smokeless tobacco. She reports that she does not drink alcohol or use drugs.   MEDICARE WELLNESS OBJECTIVES: Physical activity: Current Exercise Habits: The patient does not participate in regular exercise at present, Exercise  limited by: orthopedic condition(s) Cardiac risk factors: Cardiac Risk Factors include: advanced age (>47men, >39 women);dyslipidemia;hypertension;obesity (BMI >30kg/m2);sedentary lifestyle Depression/mood screen:   Depression screen [facility] 2/9 11/29/2019  Decreased Interest 0  Down, Depressed, Hopeless 0  PHQ - 2 Score 0  Altered sleeping 1  Tired, decreased energy 1  Change in appetite 0  Feeling bad or failure about yourself  0  Trouble concentrating 0  Moving slowly or fidgety/restless 0  Suicidal thoughts 0  PHQ-9 Score 2  Difficult doing work/chores Not difficult at all    ADLs:  In your present state of health, do you have any difficulty performing the following activities: 11/29/2019 08/22/2019  Hearing? N N  Vision? N N  Difficulty concentrating or making decisions? N N  Walking or climbing stairs? N N  Dressing or bathing? N N  Doing errands, shopping? N N  Some recent data might be hidden     Cognitive Testing  Alert? Yes  Normal Appearance?Yes  Oriented to person? Yes  Place? Yes   Time? Yes  Recall of three objects?  Yes  Can perform simple calculations? Yes  Displays appropriate judgment?Yes  Can read the correct time from a watch face?Yes  EOL planning: Does Patient Have a Medical Advance Directive?: No Would patient like information on creating a medical advance directive?: No - Patient declined  Review of Systems  Constitutional: Negative for malaise/fatigue and weight loss.  HENT: Negative for congestion, hearing loss, sore throat and tinnitus.   Eyes: Negative for blurred vision and double vision.  Respiratory: Negative for cough, sputum production, shortness of breath and wheezing.   Cardiovascular: Negative for chest pain, palpitations, orthopnea, claudication, leg swelling and PND.  Gastrointestinal: Negative for abdominal pain, blood in stool, constipation, diarrhea, heartburn, melena, nausea and vomiting.  Genitourinary: Negative.   Musculoskeletal:  Positive for joint pain (R foot, bil hands). Negative for falls and myalgias.  Skin: Negative for rash.  Neurological: Negative for dizziness, tingling, sensory change, weakness and headaches.  Endo/Heme/Allergies: Negative for environmental allergies and polydipsia.  Psychiatric/Behavioral: Negative.  Negative for depression, memory loss, substance abuse and suicidal ideas. The patient is not nervous/anxious and does not have insomnia.   All other systems reviewed and are negative.    Objective:     Today's Vitals   11/29/19 1054  BP: 120/78  Pulse: 93  Temp: (!) 96.3 F (35.7 C)  SpO2: 96%  Weight: 197 lb (89.4 kg)  Height: 5\' 5"  (1.651 m)   Body mass index is 32.78 kg/m.  General appearance: alert, no distress, WD/WN, obese female HEENT: normocephalic, sclerae anicteric, TMs pearly, nares patent, no discharge or erythema, pharynx normal Oral cavity: MMM, no lesions Neck: supple, no lymphadenopathy, no thyromegaly, no masses Heart: RRR, normal S1, S2, no murmurs Lungs: CTA bilaterally, no wheezes, rhonchi, or rales Abdomen: +bs, soft, obese non tender, non distended, no masses, no hepatomegaly, no splenomegaly Musculoskeletal: nontender, no swelling, no obvious deformity, antalgic gait.  Extremities: no edema, no cyanosis, no clubbing Pulses: 2+ symmetric, upper and lower extremities, normal cap refill Neurological: alert, oriented x 3, CN2-12 intact, strength normal upper extremities and lower extremities, sensation normal throughout, DTRs 2+ throughout, no cerebellar signs, gait antalgit Psychiatric: normal affect, behavior normal, pleasant, somewhat anxious   Medicare Attestation I have personally reviewed: The patient's medical and social history Their use of alcohol, tobacco or illicit drugs Their current medications and supplements The patient's functional ability including ADLs,fall risks, home safety risks, cognitive, and hearing and visual impairment Diet and  physical activities Evidence for depression or mood disorders  The patient's weight, height, BMI, and visual acuity have been recorded in the chart.  I have made referrals, counseling, and provided education to the patient based on review of the above and I have provided the patient with a written personalized care plan for preventive services.     Izora Ribas, NP   11/29/2019

## 2019-11-29 ENCOUNTER — Other Ambulatory Visit: Payer: Self-pay

## 2019-11-29 ENCOUNTER — Ambulatory Visit (INDEPENDENT_AMBULATORY_CARE_PROVIDER_SITE_OTHER): Payer: Medicare PPO | Admitting: Adult Health

## 2019-11-29 ENCOUNTER — Encounter: Payer: Self-pay | Admitting: Adult Health

## 2019-11-29 VITALS — BP 120/78 | HR 93 | Temp 96.3°F | Ht 65.0 in | Wt 197.0 lb

## 2019-11-29 DIAGNOSIS — E559 Vitamin D deficiency, unspecified: Secondary | ICD-10-CM | POA: Diagnosis not present

## 2019-11-29 DIAGNOSIS — Z87898 Personal history of other specified conditions: Secondary | ICD-10-CM

## 2019-11-29 DIAGNOSIS — M797 Fibromyalgia: Secondary | ICD-10-CM | POA: Diagnosis not present

## 2019-11-29 DIAGNOSIS — M170 Bilateral primary osteoarthritis of knee: Secondary | ICD-10-CM | POA: Diagnosis not present

## 2019-11-29 DIAGNOSIS — G4733 Obstructive sleep apnea (adult) (pediatric): Secondary | ICD-10-CM | POA: Diagnosis not present

## 2019-11-29 DIAGNOSIS — Z9989 Dependence on other enabling machines and devices: Secondary | ICD-10-CM

## 2019-11-29 DIAGNOSIS — E782 Mixed hyperlipidemia: Secondary | ICD-10-CM

## 2019-11-29 DIAGNOSIS — M79671 Pain in right foot: Secondary | ICD-10-CM

## 2019-11-29 DIAGNOSIS — R6889 Other general symptoms and signs: Secondary | ICD-10-CM

## 2019-11-29 DIAGNOSIS — F3341 Major depressive disorder, recurrent, in partial remission: Secondary | ICD-10-CM

## 2019-11-29 DIAGNOSIS — Z0001 Encounter for general adult medical examination with abnormal findings: Secondary | ICD-10-CM | POA: Diagnosis not present

## 2019-11-29 DIAGNOSIS — I10 Essential (primary) hypertension: Secondary | ICD-10-CM | POA: Diagnosis not present

## 2019-11-29 DIAGNOSIS — Z0000 Encounter for general adult medical examination without abnormal findings: Secondary | ICD-10-CM

## 2019-11-29 MED ORDER — ACCU-CHEK AVIVA PLUS VI STRP: ORAL_STRIP | 1 refills | Status: DC

## 2019-11-29 MED ORDER — PHENTERMINE HCL 37.5 MG PO TABS: ORAL_TABLET | ORAL | 0 refills | Status: DC

## 2019-11-29 NOTE — Patient Instructions (Addendum)
[name] , Thank you for taking time to come for your Medicare Wellness Visit. I appreciate your ongoing commitment to your health goals. Please review the following plan we discussed and let me know if I can assist you in the future.   These are the goals we discussed: Goals    . Exercise 150 min/wk Moderate Activity    . HEMOGLOBIN A1C < 5.7    . Weight (lb) < 180 lb (81.6 kg)       This is a list of the screening recommended for you and due dates:  Health Maintenance  Topic Date Due  . COVID-19 Vaccine (1) Never done  . Flu Shot  01/21/2020  . Mammogram  06/13/2021  . Tetanus Vaccine  07/04/2025  . Colon Cancer Screening  12/02/2027  .  Hepatitis C: One time screening is recommended by Center for Disease Control  (CDC) for  adults born from [birth year] through 1964.   Completed  . HIV Screening  Completed  . Pap Smear  Discontinued     Phentermine tablets or capsules What is this medicine? PHENTERMINE (FEN ter meen) decreases your appetite. It is used with a reduced calorie diet and exercise to help you lose weight. This medicine may be used for other purposes; ask your health care provider or pharmacist if you have questions. COMMON BRAND NAME(S): Adipex-P, Atti-Plex P, Atti-Plex P Spansule, Fastin, Lomaira, Pro-Fast, Tara-8 What should I tell my health care provider before I take this medicine? They need to know if you have any of these conditions:  agitation or nervousness  diabetes  glaucoma  heart disease  high blood pressure  history of drug abuse or addiction  history of stroke  kidney disease  lung disease called Primary Pulmonary Hypertension (PPH)  taken an MAOI like Carbex, Eldepryl, Marplan, Nardil, or Parnate in last 14 days  taking stimulant medicines for attention disorders, weight loss, or to stay awake  thyroid disease  an unusual or allergic reaction to phentermine, other medicines, foods, dyes, or preservatives  pregnant or trying to get  pregnant  breast-feeding How should I use this medicine? Take this medicine by mouth with a glass of water. Follow the directions on the prescription label. Take your medicine at regular intervals. Do not take it more often than directed. Do not stop taking except on your doctor's advice. Talk to your pediatrician regarding the use of this medicine in children. While this drug may be prescribed for children 17 years or older for selected conditions, precautions do apply. Overdosage: If you think you have taken too much of this medicine contact a poison control center or emergency room at once. NOTE: This medicine is only for you. Do not share this medicine with others. What if I miss a dose? If you miss a dose, take it as soon as you can. If it is almost time for your next dose, take only that dose. Do not take double or extra doses. What may interact with this medicine? Do not take this medicine with any of the following medications:  MAOIs like Carbex, Eldepryl, Marplan, Nardil, and Parnate This medicine may also interact with the following medications:  alcohol  certain medicines for depression, anxiety, or psychotic disorders  certain medicines for high blood pressure  linezolid  medicines for colds or breathing difficulties like pseudoephedrine or phenylephrine  medicines for diabetes  sibutramine  stimulant medicines for attention disorders, weight loss, or to stay awake This list may not describe all possible  interactions. Give your health care provider a list of all the medicines, herbs, non-prescription drugs, or dietary supplements you use. Also tell them if you smoke, drink alcohol, or use illegal drugs. Some items may interact with your medicine. What should I watch for while using this medicine? Visit your doctor or health care provider for regular checks on your progress. Do not stop taking except on your health care provider's advice. You may develop a severe  reaction. Your health care provider will tell you how much medicine to take. Do not take this medicine close to bedtime. It may prevent you from sleeping. You may get drowsy or dizzy. Do not drive, use machinery, or do anything that needs mental alertness until you know how this medicine affects you. Do not stand or sit up quickly, especially if you are an older patient. This reduces the risk of dizzy or fainting spells. Alcohol may increase dizziness and drowsiness. Avoid alcoholic drinks. This medicine may affect blood sugar levels. Ask your healthcare provider if changes in diet or medicines are needed if you have diabetes. Women should inform their health care provider if they wish to become pregnant or think they might be pregnant. Losing weight while pregnant is not advised and may cause harm to the unborn child. Talk to your health care provider for more information. What side effects may I notice from receiving this medicine? Side effects that you should report to your doctor or health care professional as soon as possible:  allergic reactions like skin rash, itching or hives, swelling of the face, lips, or tongue  breathing problems  changes in emotions or moods  changes in vision  chest pain or chest tightness  fast, irregular heartbeat  feeling faint or lightheaded  increased blood pressure  irritable  restlessness  tremors  seizures  signs and symptoms of a stroke like changes in vision; confusion; trouble speaking or understanding; severe headaches; sudden numbness or weakness of the face, arm or leg; trouble walking; dizziness; loss of balance or coordination  unusually weak or tired Side effects that usually do not require medical attention (report to your doctor or health care professional if they continue or are bothersome):  changes in taste  constipation or diarrhea  dizziness  dry mouth  headache  trouble sleeping  upset stomach This list may not  describe all possible side effects. Call your doctor for medical advice about side effects. You may report side effects to FDA at 1-800-FDA-1088. Where should I keep my medicine? Keep out of the reach of children. This medicine can be abused. Keep your medicine in a safe place to protect it from theft. Do not share this medicine with anyone. Selling or giving away this medicine is dangerous and against the law. This medicine may cause harm and death if it is taken by other adults, children, or pets. Return medicine that has not been used to an official disposal site. Contact the DEA at 407-631-8513 or your city/county government to find a site. If you cannot return the medicine, mix any unused medicine with a substance like cat litter or coffee grounds. Then throw the medicine away in a sealed container like a sealed bag or coffee can with a lid. Do not use the medicine after the expiration date. Store at room temperature between 20 and 25 degrees C (68 and 77 degrees F). Keep container tightly closed. NOTE: This sheet is a summary. It may not cover all possible information. If you have questions about  this medicine, talk to your doctor, pharmacist, or health care provider.  2020 Elsevier/Gold Standard (2019-04-14 12:54:20)

## 2019-11-30 ENCOUNTER — Other Ambulatory Visit: Payer: Self-pay | Admitting: Adult Health

## 2019-11-30 DIAGNOSIS — R7989 Other specified abnormal findings of blood chemistry: Secondary | ICD-10-CM

## 2019-11-30 LAB — CBC WITH DIFFERENTIAL/PLATELET
Absolute Monocytes: 437 cells/uL (ref 200–950)
Basophils Absolute: 29 cells/uL (ref 0–200)
Basophils Relative: 0.6 %
Eosinophils Absolute: 178 cells/uL (ref 15–500)
Eosinophils Relative: 3.7 %
HCT: 43.8 % (ref 35.0–45.0)
Hemoglobin: 14.7 g/dL (ref 11.7–15.5)
Lymphs Abs: 1632 cells/uL (ref 850–3900)
MCH: 30.9 pg (ref 27.0–33.0)
MCHC: 33.6 g/dL (ref 32.0–36.0)
MCV: 92 fL (ref 80.0–100.0)
MPV: 10.8 fL (ref 7.5–12.5)
Monocytes Relative: 9.1 %
Neutro Abs: 2525 cells/uL (ref 1500–7800)
Neutrophils Relative %: 52.6 %
Platelets: 305 10*3/uL (ref 140–400)
RBC: 4.76 10*6/uL (ref 3.80–5.10)
RDW: 13.2 % (ref 11.0–15.0)
Total Lymphocyte: 34 %
WBC: 4.8 10*3/uL (ref 3.8–10.8)

## 2019-11-30 LAB — COMPLETE METABOLIC PANEL WITH GFR
AG Ratio: 1.6 (calc) (ref 1.0–2.5)
ALT: 40 U/L — ABNORMAL HIGH (ref 6–29)
AST: 27 U/L (ref 10–35)
Albumin: 4.5 g/dL (ref 3.6–5.1)
Alkaline phosphatase (APISO): 90 U/L (ref 37–153)
BUN: 18 mg/dL (ref 7–25)
CO2: 27 mmol/L (ref 20–32)
Calcium: 10.2 mg/dL (ref 8.6–10.4)
Chloride: 101 mmol/L (ref 98–110)
Creat: 0.63 mg/dL (ref 0.50–0.99)
GFR, Est African American: 111 mL/min/{1.73_m2} (ref >=60)
GFR, Est Non African American: 96 mL/min/{1.73_m2} (ref >=60)
Globulin: 2.8 g/dL (calc) (ref 1.9–3.7)
Glucose, Bld: 96 mg/dL (ref 65–99)
Potassium: 3.9 mmol/L (ref 3.5–5.3)
Sodium: 140 mmol/L (ref 135–146)
Total Bilirubin: 0.6 mg/dL (ref 0.2–1.2)
Total Protein: 7.3 g/dL (ref 6.1–8.1)

## 2019-11-30 LAB — LIPID PANEL
Cholesterol: 136 mg/dL (ref <200)
HDL: 50 mg/dL (ref >=50)
LDL Cholesterol (Calc): 64 mg/dL (calc)
Non-HDL Cholesterol (Calc): 86 mg/dL (calc) (ref <130)
Total CHOL/HDL Ratio: 2.7 (calc) (ref <5.0)
Triglycerides: 132 mg/dL (ref <150)

## 2019-11-30 LAB — TSH: TSH: 1.35 mIU/L (ref 0.40–4.50)

## 2019-11-30 LAB — MAGNESIUM: Magnesium: 2.3 mg/dL (ref 1.5–2.5)

## 2019-12-04 ENCOUNTER — Other Ambulatory Visit: Payer: Self-pay

## 2019-12-04 ENCOUNTER — Ambulatory Visit: Payer: Medicare Other | Admitting: Rheumatology

## 2019-12-04 ENCOUNTER — Encounter: Payer: Self-pay | Admitting: Rheumatology

## 2019-12-04 VITALS — BP 138/75 | HR 94 | Resp 15 | Ht 65.0 in | Wt 199.4 lb

## 2019-12-04 DIAGNOSIS — E782 Mixed hyperlipidemia: Secondary | ICD-10-CM | POA: Diagnosis not present

## 2019-12-04 DIAGNOSIS — M797 Fibromyalgia: Secondary | ICD-10-CM | POA: Diagnosis not present

## 2019-12-04 DIAGNOSIS — F3341 Major depressive disorder, recurrent, in partial remission: Secondary | ICD-10-CM

## 2019-12-04 DIAGNOSIS — I10 Essential (primary) hypertension: Secondary | ICD-10-CM

## 2019-12-04 DIAGNOSIS — M19041 Primary osteoarthritis, right hand: Secondary | ICD-10-CM | POA: Diagnosis not present

## 2019-12-04 DIAGNOSIS — Z79899 Other long term (current) drug therapy: Secondary | ICD-10-CM

## 2019-12-04 DIAGNOSIS — M19072 Primary osteoarthritis, left ankle and foot: Secondary | ICD-10-CM

## 2019-12-04 DIAGNOSIS — G4733 Obstructive sleep apnea (adult) (pediatric): Secondary | ICD-10-CM

## 2019-12-04 DIAGNOSIS — M0609 Rheumatoid arthritis without rheumatoid factor, multiple sites: Secondary | ICD-10-CM | POA: Diagnosis not present

## 2019-12-04 DIAGNOSIS — M19071 Primary osteoarthritis, right ankle and foot: Secondary | ICD-10-CM

## 2019-12-04 DIAGNOSIS — Z96653 Presence of artificial knee joint, bilateral: Secondary | ICD-10-CM

## 2019-12-04 DIAGNOSIS — R768 Other specified abnormal immunological findings in serum: Secondary | ICD-10-CM | POA: Diagnosis not present

## 2019-12-04 DIAGNOSIS — Z9989 Dependence on other enabling machines and devices: Secondary | ICD-10-CM

## 2019-12-04 DIAGNOSIS — Z87898 Personal history of other specified conditions: Secondary | ICD-10-CM

## 2019-12-04 DIAGNOSIS — E559 Vitamin D deficiency, unspecified: Secondary | ICD-10-CM

## 2019-12-04 DIAGNOSIS — M19042 Primary osteoarthritis, left hand: Secondary | ICD-10-CM

## 2019-12-04 MED ORDER — HYDROXYCHLOROQUINE SULFATE 200 MG PO TABS: 200.0000 mg | ORAL_TABLET | Freq: Every day | ORAL | 2 refills | Status: DC

## 2019-12-04 NOTE — Patient Instructions (Signed)
Standing Labs We placed an order today for your standing lab work.    Please come back and get your standing labs in 1 month and every 3 monthsHydroxychloroquine tablets What is this medicine? HYDROXYCHLOROQUINE (hye drox ee KLOR oh kwin) is used to treat rheumatoid arthritis and systemic lupus erythematosus. It is also used to treat malaria. This medicine may be used for other purposes; ask your health care provider or pharmacist if you have questions. COMMON BRAND NAME(S): Plaquenil, Quineprox What should I tell my health care provider before I take this medicine? They need to know if you have any of these conditions:  diabetes  eye disease, vision problems  G6PD deficiency  heart disease  history of irregular heartbeat  if you often drink alcohol  kidney disease  liver disease  porphyria  psoriasis  an unusual or allergic reaction to chloroquine, hydroxychloroquine, other medicines, foods, dyes, or preservatives  pregnant or trying to get pregnant  breast-feeding How should I use this medicine? Take this medicine by mouth with a glass of water. Follow the directions on the prescription label. Do not cut, crush or chew this medicine. Swallow the tablets whole. Take this medicine with food. Avoid taking antacids within 4 hours of taking this medicine. It is best to separate these medicines by at least 4 hours. Take your medicine at regular intervals. Do not take it more often than directed. Take all of your medicine as directed even if you think you are better. Do not skip doses or stop your medicine early. Talk to your pediatrician regarding the use of this medicine in children. While this drug may be prescribed for selected conditions, precautions do apply. Overdosage: If you think you have taken too much of this medicine contact a poison control center or emergency room at once. NOTE: This medicine is only for you. Do not share this medicine with others. What if I miss a  dose? If you miss a dose, take it as soon as you can. If it is almost time for your next dose, take only that dose. Do not take double or extra doses. What may interact with this medicine? Do not take this medicine with any of the following medications:  cisapride  dronedarone  pimozide  thioridazine This medicine may also interact with the following medications:  ampicillin  antacids  cimetidine  cyclosporine  digoxin  kaolin  medicines for diabetes, like insulin, glipizide, glyburide  medicines for seizures like carbamazepine, phenobarbital, phenytoin  mefloquine  methotrexate  other medicines that prolong the QT interval (cause an abnormal heart rhythm)  praziquantel This list may not describe all possible interactions. Give your health care provider a list of all the medicines, herbs, non-prescription drugs, or dietary supplements you use. Also tell them if you smoke, drink alcohol, or use illegal drugs. Some items may interact with your medicine. What should I watch for while using this medicine? Visit your health care professional for regular checks on your progress. Tell your health care professional if your symptoms do not start to get better or if they get worse. You may need blood work done while you are taking this medicine. If you take other medicines that can affect heart rhythm, you may need more testing. Talk to your health care professional if you have questions. Your vision may be tested before and during use of this medicine. Tell your health care professional right away if you have any change in your eyesight. What side effects may I notice from  receiving this medicine? Side effects that you should report to your doctor or health care professional as soon as possible:  allergic reactions like skin rash, itching or hives, swelling of the face, lips, or tongue  changes in vision  decreased hearing or ringing of the ears  muscle weakness  redness,  blistering, peeling or loosening of the skin, including inside the mouth  sensitivity to light  signs and symptoms of a dangerous change in heartbeat or heart rhythm like chest pain; dizziness; fast or irregular heartbeat; palpitations; feeling faint or lightheaded, falls; breathing problems  signs and symptoms of liver injury like dark yellow or brown urine; general ill feeling or flu-like symptoms; light-colored stools; loss of appetite; nausea; right upper belly pain; unusually weak or tired; yellowing of the eyes or skin  signs and symptoms of low blood sugar such as feeling anxious; confusion; dizziness; increased hunger; unusually weak or tired; sweating; shakiness; cold; irritable; headache; blurred vision; fast heartbeat; loss of consciousness  suicidal thoughts  uncontrollable head, mouth, neck, arm, or leg movements Side effects that usually do not require medical attention (report to your doctor or health care professional if they continue or are bothersome):  diarrhea  dizziness  hair loss  headache  irritable  loss of appetite  nausea, vomiting  stomach pain This list may not describe all possible side effects. Call your doctor for medical advice about side effects. You may report side effects to FDA at 1-800-FDA-1088. Where should I keep my medicine? Keep out of the reach of children. Store at room temperature between 15 and 30 degrees C (59 and 86 degrees F). Protect from moisture and light. Throw away any unused medicine after the expiration date. NOTE: This sheet is a summary. It may not cover all possible information. If you have questions about this medicine, talk to your doctor, pharmacist, or health care provider.  2020 Elsevier/Gold Standard (2018-10-17 12:56:32)   We have open lab daily Monday through Thursday from 8:30-12:30 PM and 1:30-4:30 PM and Friday from 8:30-12:30 PM and 1:30-4:00 PM at the office of Dr. Bo Merino.   You may experience  shorter wait times on Monday and Friday afternoons. The office is located at [address] [place], [place], [place] [address] No appointment is necessary.   Labs are drawn by [facility].  You may receive a bill from [facility] for your lab work.  If you wish to have your labs drawn at another location, please call the office 24 hours in advance to send orders.  If you have any questions regarding directions or hours of operation,  please call [phone].   Just as a reminder please drink plenty of water prior to coming for your lab work. Thanks!

## 2019-12-04 NOTE — Progress Notes (Signed)
Pharmacy Note  Subjective: Patient presents today to [facility] for follow up office visit.   Patient seen by the pharmacist for counseling on hydroxychloroquine rheumatoid arthritis.  She is naive to therapy.  Objective: CMP     Component Value Date/Time   NA 140 11/29/2019 1149   K 3.9 11/29/2019 1149   CL 101 11/29/2019 1149   CO2 27 11/29/2019 1149   GLUCOSE 96 11/29/2019 1149   BUN 18 11/29/2019 1149   CREATININE 0.63 11/29/2019 1149   CALCIUM 10.2 11/29/2019 1149   PROT 7.3 11/29/2019 1149   ALBUMIN 4.7 01/03/2019 1350   AST 27 11/29/2019 1149   ALT 40 (H) 11/29/2019 1149   ALKPHOS 89 01/03/2019 1350   BILITOT 0.6 11/29/2019 1149   GFRNONAA 96 11/29/2019 1149   GFRAA 111 11/29/2019 1149    CBC    Component Value Date/Time   WBC 4.8 11/29/2019 1149   RBC 4.76 11/29/2019 1149   HGB 14.7 11/29/2019 1149   HCT 43.8 11/29/2019 1149   PLT 305 11/29/2019 1149   MCV 92.0 11/29/2019 1149   MCH 30.9 11/29/2019 1149   MCHC 33.6 11/29/2019 1149   RDW 13.2 11/29/2019 1149   LYMPHSABS 1,632 11/29/2019 1149   MONOABS 0.7 01/03/2019 1350   EOSABS 178 11/29/2019 1149   BASOSABS 29 11/29/2019 1149    Assessment/Plan: Patient was counseled on the purpose, proper use, and adverse effects of hydroxychloroquine including nausea/diarrhea, skin rash, headaches, and sun sensitivity.  Discussed importance of annual eye exams while on hydroxychloroquine to monitor to ocular toxicity and discussed importance of frequent laboratory monitoring.  Provided patient with eye exam form for baseline ophthalmologic exam and standing lab instructions.  Provided patient with educational materials on hydroxychloroquine and answered all questions.  Patient consented to hydroxychloroquine.  Will upload consent in the media tab.     Dose will be Plaquenil 200 mg twice daily.  Patient does have a sulfa allergy.  Patient states reaction was a rash and was very mild.  Discussed potential for  cross-reactivity with Plaquenil.  Advised patient to take 1 tablet for a few days with Benadryl on hand in case of an allergic reaction and then increase to twice daily.  Patient verbalized understanding.  All questions encouraged and answered.  Instructed patient to call with any further questions or concerns.   Mariella Saa, PharmD, [place], CPP Clinical Specialty Pharmacist (Rheumatology and Pulmonology)  12/04/2019 11:43 AM

## 2019-12-11 ENCOUNTER — Ambulatory Visit
Admission: RE | Admit: 2019-12-11 | Discharge: 2019-12-11 | Disposition: A | Discharge status: Home / Self Care | Payer: Medicare PPO | Source: Ambulatory Visit | Attending: Adult Health | Admitting: Adult Health

## 2019-12-11 DIAGNOSIS — R7989 Other specified abnormal findings of blood chemistry: Secondary | ICD-10-CM

## 2019-12-11 DIAGNOSIS — K760 Fatty (change of) liver, not elsewhere classified: Secondary | ICD-10-CM | POA: Diagnosis not present

## 2019-12-12 ENCOUNTER — Other Ambulatory Visit: Payer: Self-pay | Admitting: Adult Health

## 2019-12-12 DIAGNOSIS — K760 Fatty (change of) liver, not elsewhere classified: Secondary | ICD-10-CM

## 2019-12-12 DIAGNOSIS — N281 Cyst of kidney, acquired: Secondary | ICD-10-CM

## 2019-12-12 HISTORY — DX: Cyst of kidney, acquired: N28.1

## 2020-01-01 NOTE — Progress Notes (Signed)
64 y.o.female with history of obesity with OSA, HTN, chol presents for a follow up after being on phentermine for weight loss.   While on the medication they have lost 8 lbs since last visit which was 4 weeks ago.  She has had severe dry mouth since starting, some times trouble with swallowing spit.  Has had burping x Monday, no nausea, no AB pain, no GERD, no diarrhea/constipation. She has been doing more hard candies for the dry mouth.  They deny palpitations, anxiety, trouble sleeping, elevated BP.   BMI is Body mass index is 31.78 kg/m., she is working on diet and exercise. Wt Readings from Last 3 Encounters:  01/03/20 191 lb (86.6 kg)  12/04/19 199 lb 6.4 oz (90.4 kg)  11/29/19 197 lb (89.4 kg)    Medications:   Current Outpatient Medications (Cardiovascular):  .  hydrochlorothiazide (HYDRODIURIL) 25 MG tablet, Take 1 tablet Daily for BP & Fluid Retention / Ankle Swelling .  rosuvastatin (CRESTOR) 40 MG tablet, Take 1 tablet Daily for Cholesterol  Current Outpatient Medications (Respiratory):  .  cetirizine (ZYRTEC) 10 MG tablet, Take 10 mg by mouth daily.  .  fluticasone (FLONASE) 50 MCG/ACT nasal spray, Use 1 to 2 sprays each nostril 1 to 2 x /day (Patient taking differently: Place 1 spray into both nostrils daily as needed for allergies. Use 1 to 2 sprays each nostril 1 to 2 x /day)  Current Outpatient Medications (Analgesics):  .  aspirin EC 81 MG tablet, Take 81 mg by mouth daily. .  traMADol (ULTRAM) 50 MG tablet, Use 1 to 2 tablets every 4 hours as needed for Severe Pain   Current Outpatient Medications (Other):  Marland Kitchen  Cholecalciferol (VITAMIN D PO), Take 8,000 Int'l Units by mouth daily. 2000 units per capsule .  clonazePAM (KLONOPIN) 1 MG tablet, Take 1 mg by mouth at bedtime as needed for anxiety.  Marland Kitchen  glucose blood (ACCU-CHEK AVIVA PLUS) test strip, CHECK BLOOD SUGAR 1 TIME DAILY. [id number] .  hydroxychloroquine (PLAQUENIL) 200 MG tablet, Take 1 tablet (200 mg total) by  mouth daily. .  Lancets (ACCU-CHEK MULTICLIX) lancets, Use to check blood glucose daily .  Lysine 500 MG TABS, Take 1,000 mg by mouth daily. Currently taking 2 pill QD .  Magnesium 500 MG TABS, Take 3,000 mg by mouth 3 (three) times daily. Takes 1000mg  TID .  Multiple Vitamin (MULTIVITAMIN WITH MINERALS) TABS tablet, Take 0.5 tablets by mouth 2 (two) times daily. .  phentermine (ADIPEX-P) 37.5 MG tablet, Take 1/2 to 1 tablet every morning for dieting & weightloss .  QUEtiapine (SEROQUEL) 100 MG tablet, Take 100 mg by mouth 3 (three) times daily. Marland Kitchen  lamoTRIgine (LAMICTAL) 150 MG tablet, Take 1 & 1/2 tablets at bedtime (Patient taking differently: Take 150 mg by mouth at bedtime. )  ROS: All negative except for above  Physical exam: Vitals:   01/03/20 1047  BP: 126/80  Pulse: (!) 105  Temp: (!) 97.5 F (36.4 C)  SpO2: 97%   Physical Exam General appearance: alert, no distress, WD/WN, obese female HEENT: normocephalic, sclerae anicteric, TMs pearly, nares patent, no discharge or erythema, pharynx normal Oral cavity: MMM, no lesions Neck: supple, no lymphadenopathy, no thyromegaly, no masses Heart: RRR, normal S1, S2, no murmurs Lungs: CTA bilaterally, no wheezes, rhonchi, or rales Abdomen: +bs, soft, obese non tender, non distended, no masses, no hepatomegaly, no splenomegaly Musculoskeletal: nontender, no swelling, no obvious deformity, antalgic gait.  Extremities: no edema, no cyanosis, no clubbing Pulses:  2+ symmetric, upper and lower extremities, normal cap refill Neurological: alert, oriented x 3, CN2-12 intact, strength normal upper extremities and lower extremities, sensation normal throughout, DTRs 2+ throughout, no cerebellar signs, gait antalgit Psychiatric: normal affect, behavior normal, pleasant, somewhat anxious   Assessment: Obesity with co morbid conditions.   Plan: She will work on meal planning, intentional eating, and increasing water.  She has been instructed  to work up to a goal of 150 minutes of combined cardio and strengthening exercise per week for weight loss and overall health benefits. We discussed the following Behavioral Modification Strategies today: increasing lean protein intake, decreasing simple carbohydrates, increasing vegetables, increase H20 intake, decrease eating out, no skipping meals, work on meal planning and easy cooking plans, keeping healthy foods in the home, and planning for success.  Medication refill: phentermine but will do 1/2 pill but stop if continuing dry mouth Burping/beltching no pain, normal exam, will stop hard candies- ? Need to stop vitamin E Will talk with Precious Gilding about adding on topamax for weight loss due to psych medications Patient is anxious about her MRI AB due to kidney cyst- will do close follow up after MRI  Future Appointments  Date Time Provider [place]  01/17/2020  1:45 PM Bo Merino, MD [facility] None  01/24/2020  4:40 PM [facility] MR 1 [id number] [address] [facility]  03/04/2020 11:30 AM Unk Pinto, MD [facility] None  09/02/2020 11:00 AM Unk Pinto, MD [facility] None

## 2020-01-03 ENCOUNTER — Ambulatory Visit (INDEPENDENT_AMBULATORY_CARE_PROVIDER_SITE_OTHER): Payer: Medicare PPO | Admitting: Physician Assistant

## 2020-01-03 ENCOUNTER — Other Ambulatory Visit: Payer: Self-pay

## 2020-01-03 ENCOUNTER — Encounter: Payer: Self-pay | Admitting: Physician Assistant

## 2020-01-03 DIAGNOSIS — I10 Essential (primary) hypertension: Secondary | ICD-10-CM | POA: Diagnosis not present

## 2020-01-03 DIAGNOSIS — E782 Mixed hyperlipidemia: Secondary | ICD-10-CM | POA: Diagnosis not present

## 2020-01-03 DIAGNOSIS — Z9989 Dependence on other enabling machines and devices: Secondary | ICD-10-CM | POA: Diagnosis not present

## 2020-01-03 DIAGNOSIS — G4733 Obstructive sleep apnea (adult) (pediatric): Secondary | ICD-10-CM | POA: Diagnosis not present

## 2020-01-03 DIAGNOSIS — K760 Fatty (change of) liver, not elsewhere classified: Secondary | ICD-10-CM | POA: Diagnosis not present

## 2020-01-03 MED ORDER — ACCU-CHEK MULTICLIX LANCETS MISC: 99 refills | Status: DC

## 2020-01-03 MED ORDER — PHENTERMINE HCL 37.5 MG PO TABS: ORAL_TABLET | ORAL | 0 refills | Status: DC

## 2020-01-03 NOTE — Patient Instructions (Addendum)
I will contact [name] your NP to see if she would be okay with me adding on topamax.   Cut back on the phentermine Increase water 80 oz a day If you keep having the dry mouth than stop the phentermine completely  Who Qualifies for Obesity Medications? Although everyone is hopeful for a fast and easy way to lose weight, nothing has been shown to replace a prudent, calorie-controlled diet along with behavior modification as a cornerstone for all obesity treatments.   The next tool that can be used to achieve weight-loss and health improvement is medication.   Pharmacotherapy may be offered to Individuals affected by obesity who have failed to achieve weight-loss through diet and exercise alone.  We have decided to try you on a weight loss medication here is some general information about this medication.    TOPIRAMATE Sometimes we will prescribe topiramate AND phentermine together to make Qsymia- separating the medications makes it cheaper.  Or topiramate can be used by itself for weight loss at night.   How to start it start on 1/2 pill for 3-5 nights, can increase to a whole pill for 1-2 weeks.  This medication is good for weight loss, headaches, pain This medication can cause numbness, tingling and can cause brain fog- stop if you get these Check with your eye doctor if you have a history of glaucoma and stop if you severe vision changes or blurry vision.  There is a long acting medication that we can switch to if you have numbness,tingling or brain fog.   Topiramate has been associated with an increased risk of birth defects- and should NOT be taken if pregnant or becoming pregnant.   Follow-up Visits: Frequent visits (every 3 to 4 weeks) are encouraged until initial weight-loss goals (5 to 10 percent of body weight) are achieved.   At that point, less frequent visits are typically scheduled as needed for individual patients. However, since obesity is considered a chronic  life-long problem for many individuals, periodic continual follow up is recommended.  Research has shown that weight-loss as low as 5 percent of initial body weight can lead to favorable improvements in blood pressure, cholesterol, glucose levels and insulin sensitivity. The risk of developing heart disease is reduced the most in patients who have impaired glucose tolerance, type 2 diabetes or high blood pressure.     1. Belching is caused by swallowed air from:  Eating or drinking too fast  Poorly fitting dentures; not chewing food completely  Carbonated beverages  Chewing gum or sucking on hard candies  Excessive swallowing due to nervous tension or postnasal drip  Forced belching to relieve abdominal discomfort 2. To prevent excessive belching, avoid:  Carbonated beverages  Chewing gum  Hard candies  Simethicone/GasX may be helpful   Can take pepcid daily for 5 days.

## 2020-01-08 DIAGNOSIS — G44209 Tension-type headache, unspecified, not intractable: Secondary | ICD-10-CM | POA: Diagnosis not present

## 2020-01-08 DIAGNOSIS — R519 Headache, unspecified: Secondary | ICD-10-CM | POA: Diagnosis not present

## 2020-01-08 DIAGNOSIS — Z5181 Encounter for therapeutic drug level monitoring: Secondary | ICD-10-CM | POA: Diagnosis not present

## 2020-01-17 ENCOUNTER — Ambulatory Visit: Payer: Medicare PPO | Admitting: Rheumatology

## 2020-01-17 ENCOUNTER — Encounter: Payer: Self-pay | Admitting: Rheumatology

## 2020-01-17 ENCOUNTER — Other Ambulatory Visit: Payer: Self-pay

## 2020-01-17 VITALS — BP 137/70 | HR 87 | Resp 16 | Ht 65.0 in | Wt 189.0 lb

## 2020-01-17 DIAGNOSIS — G4733 Obstructive sleep apnea (adult) (pediatric): Secondary | ICD-10-CM

## 2020-01-17 DIAGNOSIS — M797 Fibromyalgia: Secondary | ICD-10-CM

## 2020-01-17 DIAGNOSIS — I10 Essential (primary) hypertension: Secondary | ICD-10-CM | POA: Diagnosis not present

## 2020-01-17 DIAGNOSIS — E559 Vitamin D deficiency, unspecified: Secondary | ICD-10-CM

## 2020-01-17 DIAGNOSIS — Z96653 Presence of artificial knee joint, bilateral: Secondary | ICD-10-CM | POA: Diagnosis not present

## 2020-01-17 DIAGNOSIS — M19041 Primary osteoarthritis, right hand: Secondary | ICD-10-CM | POA: Diagnosis not present

## 2020-01-17 DIAGNOSIS — M0609 Rheumatoid arthritis without rheumatoid factor, multiple sites: Secondary | ICD-10-CM

## 2020-01-17 DIAGNOSIS — R768 Other specified abnormal immunological findings in serum: Secondary | ICD-10-CM | POA: Diagnosis not present

## 2020-01-17 DIAGNOSIS — E782 Mixed hyperlipidemia: Secondary | ICD-10-CM

## 2020-01-17 DIAGNOSIS — M19071 Primary osteoarthritis, right ankle and foot: Secondary | ICD-10-CM

## 2020-01-17 DIAGNOSIS — F3341 Major depressive disorder, recurrent, in partial remission: Secondary | ICD-10-CM

## 2020-01-17 DIAGNOSIS — Z79899 Other long term (current) drug therapy: Secondary | ICD-10-CM

## 2020-01-17 DIAGNOSIS — M19072 Primary osteoarthritis, left ankle and foot: Secondary | ICD-10-CM

## 2020-01-17 DIAGNOSIS — M19042 Primary osteoarthritis, left hand: Secondary | ICD-10-CM

## 2020-01-17 DIAGNOSIS — Z87898 Personal history of other specified conditions: Secondary | ICD-10-CM

## 2020-01-17 DIAGNOSIS — Z9989 Dependence on other enabling machines and devices: Secondary | ICD-10-CM

## 2020-01-17 MED ORDER — HYDROXYCHLOROQUINE SULFATE 200 MG PO TABS: ORAL_TABLET | ORAL | 0 refills | Status: DC

## 2020-01-17 NOTE — Patient Instructions (Addendum)
Vaccines You are taking a medication(s) that can suppress your immune system.  The following immunizations are recommended: . Flu annually . Covid-19  o Please continue to wear your mask, practice social distancing and hand hygiene if you are on any medications that suppress your immune system or if you have autoimmune disease. o If you are on methotrexate, Cellcept (mycophenolate), Rinvoq, Morrie Sheldon, and Olumiant - Hold medication for 1 week after each vaccine dose o If you are on Orencia Subcutaneous injections - Hold medication both one week prior to and one week after the first vaccine dose (only) o If you are on Orencia IV infusions - Time vaccine administration so that the first vaccination will occur four weeks after infusion . Pneumonia (Pneumovax 23 and Prevnar 13 spaced at least 1 year apart) . Shingrix (after age 46)  Please check with your PCP to make sure you are up to date.  If you develop COVID-19 infection please contact your PCP or our office to get a referral to Covid antibody infusion clinic  Standing Labs We placed an order today for your standing lab work.   Please have your standing labs drawn in November  If possible, please have your labs drawn 2 weeks prior to your appointment so that the provider can discuss your results at your appointment.  We have open lab daily Monday through Thursday from 8:30-12:30 PM and 1:30-4:30 PM and Friday from 8:30-12:30 PM and 1:30-4:00 PM at the office of Dr. Bo Merino, [facility].   Please be advised, patients with office appointments requiring lab work will take precedents over walk-in lab work.  If possible, please come for your lab work on Monday and Friday afternoons, as you may experience shorter wait times. The office is located at [address], [place], [place], [place] [address] No appointment is necessary.   Labs are drawn by [facility]. Please bring your co-pay at the time of your lab draw.  You may  receive a bill from [facility] for your lab work.  If you wish to have your labs drawn at another location, please call the office 24 hours in advance to send orders.  If you have any questions regarding directions or hours of operation,  please call [phone].   As a reminder, please drink plenty of water prior to coming for your lab work. Thanks!

## 2020-01-17 NOTE — Progress Notes (Signed)
Office Visit Note  Patient: [name]             Date of Birth: 1956-04-11           MRN: [id number]             PCP: Unk Pinto, MD Referring: Unk Pinto, MD Visit Date: 01/17/2020 Occupation: '@GUAROCC'$ @  Subjective:  Dry mouth   History of Present Illness: [name] is a 64 y.o. female with history of rheumatoid arthritis and osteoarthritis.  She was a started on Plaquenil in June due to ongoing pain and inflammation.  She states her symptoms gradually improved and she has been tolerating the medication well.  She denies any joint pain or joint swelling currently.  She continues to have dry mouth.  Activities of Daily Living:  Patient reports morning stiffness for 30  minutes.   Patient Denies nocturnal pain.  Difficulty dressing/grooming: Denies Difficulty climbing stairs: Reports Difficulty getting out of chair: Reports Difficulty using hands for taps, buttons, cutlery, and/or writing: Denies  Review of Systems  Constitutional: Positive for fatigue.  HENT: Positive for mouth dryness. Negative for mouth sores and nose dryness.   Eyes: Negative for pain, itching, visual disturbance and dryness.  Respiratory: Negative for cough, hemoptysis, shortness of breath and difficulty breathing.   Cardiovascular: Negative for chest pain, palpitations, hypertension and swelling in legs/feet.  Gastrointestinal: Negative for blood in stool, constipation and diarrhea.  Endocrine: Negative for increased urination.  Genitourinary: Negative for difficulty urinating and painful urination.  Musculoskeletal: Positive for morning stiffness. Negative for arthralgias, joint pain, joint swelling, myalgias, muscle weakness, muscle tenderness and myalgias.  Skin: Negative for color change, pallor, rash, hair loss, nodules/bumps, redness, skin tightness, ulcers and sensitivity to sunlight.  Allergic/Immunologic: Negative for susceptible to infections.  Neurological: Negative for  dizziness, numbness, headaches, memory loss and weakness.  Hematological: Negative for bruising/bleeding tendency and swollen glands.  Psychiatric/Behavioral: Negative for depressed mood, confusion and sleep disturbance. The patient is not nervous/anxious.     PMFS History:  Patient Active Problem List   Diagnosis Date Noted  . Acquired cyst of kidney 12/12/2019  . Fatty liver 12/12/2019  . Right foot pain 11/29/2019  . History of prediabetes 05/23/2019  . Osteoarthritis of both knees 03/20/2015  . Encounter for Medicare annual wellness exam 03/20/2015  . OSA on CPAP 08/15/2014  . Fibromyalgia 01/31/2014  . Depression, major, recurrent, in partial remission ([place]) 01/31/2014  . Morbid obesity ([place]) - BMI 30+ with OSA 01/31/2014  . Essential hypertension 08/09/2013  . Mixed hyperlipidemia 08/09/2013  . Vitamin D deficiency 08/09/2013    Past Medical History:  Diagnosis Date  . Arthritis   . Depression   . Elevated hemoglobin A1c   . Fibromyalgia   . Hypertension   . Other abnormal glucose 08/09/2013  . S/P total knee arthroplasty, left 01/07/2019  . Sleep apnea    cpap   > 4 yrs  last sleep study  . Status post total shoulder arthroplasty, left 01/07/2019  . Unspecified essential hypertension 08/09/2013  . Unspecified vitamin D deficiency 08/09/2013  . Vitamin D deficiency     Family History  Problem Relation Age of Onset  . Hypertension Mother   . Heart attack Mother   . Hypertension Father   . COPD Father   . Cancer Father        thyroid  . CVA Maternal Grandmother 40  . Heart attack Maternal Grandfather 59  . Deep vein thrombosis Maternal  Grandfather   . Colon cancer Neg Hx   . Esophageal cancer Neg Hx   . Stomach cancer Neg Hx    Past Surgical History:  Procedure Laterality Date  . ABDOMINAL HYSTERECTOMY  2002   Dr. Brien Mates   . COLONOSCOPY  2009  . JOINT REPLACEMENT     arthroscopy  . KNEE ARTHROPLASTY    . TOTAL KNEE ARTHROPLASTY Right 04/14/2013    Procedure: RIGHT TOTAL KNEE ARTHROPLASTY AND LEFT KNEE INJECTION ;  Surgeon: Alta Corning, MD;  Location: [facility];  Service: Orthopedics;  Laterality: Right;  . TOTAL KNEE ARTHROPLASTY Left 01/06/2019   Procedure: TOTAL KNEE ARTHROPLASTY;  Surgeon: Dorna Leitz, MD;  Location: [facility];  Service: Orthopedics;  Laterality: Left;   Social History   Social History Narrative  . Not on file   Immunization History  Administered Date(s) Administered  . DT (Pediatric) 07/05/2015  . Influenza Inj Mdck Quad With Preservative 05/28/2017, 04/13/2018  . Influenza Split 04/06/2012, 04/05/2013, 05/16/2014, 03/21/2015  . Influenza,inj,Quad PF,6+ Mos 03/16/2019  . Influenza,inj,quad, With Preservative 04/29/2016  . Pneumococcal Conjugate-13 05/16/2014  . Pneumococcal-Unspecified 06/22/1998  . Td 06/22/2004  . Zoster 10/21/2015     Objective: Vital Signs: BP (!) 137/70 (BP Location: Left Arm, Patient Position: Sitting, Cuff Size: Normal)   Pulse 87   Resp 16   Ht '5\' 5"'$  (1.651 m)   Wt 189 lb (85.7 kg)   BMI 31.45 kg/m    Physical Exam Vitals and nursing note reviewed.  Constitutional:      Appearance: She is well-developed.  HENT:     Head: Normocephalic and atraumatic.  Eyes:     Conjunctiva/sclera: Conjunctivae normal.  Pulmonary:     Effort: Pulmonary effort is normal.  Abdominal:     General: Bowel sounds are normal.     Palpations: Abdomen is soft.  Musculoskeletal:     Cervical back: Normal range of motion.  Lymphadenopathy:     Cervical: No cervical adenopathy.  Skin:    General: Skin is warm and dry.     Capillary Refill: Capillary refill takes less than 2 seconds.  Neurological:     Mental Status: She is alert and oriented to person, place, and time.  Psychiatric:        Behavior: Behavior normal.      Musculoskeletal Exam: She has limited range of motion of her cervical spine.  Shoulder joints, elbow joints, wrist joints with good range of motion.  She had no MCP  synovitis.  She has DIP and PIP thickening bilaterally.  Hip joints in good range of motion.  Her both knee joints are replaced.  No synovitis was noted over the MTPs and PIPs. CDAI Exam: CDAI Score: 0.4  Patient Global: 2 mm; Provider Global: 2 mm Swollen: 0 ; Tender: 0  Joint Exam 01/17/2020   No joint exam has been documented for this visit   There is currently no information documented on the homunculus. Go to the Rheumatology activity and complete the homunculus joint exam.  Investigation: No additional findings.  Imaging: No results found.  Recent Labs: Lab Results  Component Value Date   WBC 4.8 11/29/2019   HGB 14.7 11/29/2019   PLT 305 11/29/2019   NA 140 11/29/2019   K 3.9 11/29/2019   CL 101 11/29/2019   CO2 27 11/29/2019   GLUCOSE 96 11/29/2019   BUN 18 11/29/2019   CREATININE 0.63 11/29/2019   BILITOT 0.6 11/29/2019   ALKPHOS 89 01/03/2019  AST 27 11/29/2019   ALT 40 (H) 11/29/2019   PROT 7.3 11/29/2019   ALBUMIN 4.7 01/03/2019   CALCIUM 10.2 11/29/2019   GFRAA 111 11/29/2019    Speciality Comments: PLQ Eye Exam: 01/08/2020 WNL @ [facility] Follow up in 6 months.   Procedures:  No procedures performed Allergies: Latex, Celecoxib, Cephalexin, Codeine, Erythromycin, Sulfonamide derivatives, Trazodone and nefazodone, and Venlafaxine   Assessment / Plan:     Visit Diagnoses: Rheumatoid arthritis of multiple sites with negative rheumatoid factor ([place]) - RF-, +anti-CCP.  Patient has no synovitis on examination today.  She had very good response to Plaquenil.  She has been taking Plaquenil twice daily.  As she is doing well we will reduce the dose of Plaquenil to twice daily Monday to Friday.  She still have some limitation in her joints due to underlying osteoarthritis.  Positive anti-CCP test - Anti-CCP 39 (weak positive), RF negative, ESR 6.   High risk medication use - plaquenil '200mg'$  po twice daily.  CBC and CMP were normal on November 29, 2019.PLQ Eye Exam: 01/08/2020 WNL @ [facility] was within normal limits which was reviewed.- Plan: CBC with Differential/Platelet, COMPLETE METABOLIC PANEL WITH GFR today and then in 3 months.    She has had COVID-19 vaccination.  I advised her to continue to wear mask and practice social distancing and hand hygiene.  I also advised in case she develops COVID-19 infection she will need COVID-19 antibody infusion.  Primary osteoarthritis of both hands-joint protection muscle strengthening was discussed.  Primary osteoarthritis of both feet-proper fitting shoes were discussed.  Status post total bilateral knee replacement-doing well.  Fibromyalgia-she continues to have some discomfort due to fibromyalgia.  Mixed hyperlipidemia  Essential hypertension-her blood pressure is mildly elevated.  I have advised her to monitor blood pressure.  Depression, major, recurrent, in partial remission ([place])  History of prediabetes  OSA on CPAP  Vitamin D deficiency  Orders: Orders Placed This Encounter  Procedures  . CBC with Differential/Platelet  . COMPLETE METABOLIC PANEL WITH GFR   Meds ordered this encounter  Medications  . hydroxychloroquine (PLAQUENIL) 200 MG tablet    Sig: Take 1 tablet by mouth twice daily, Monday through Friday only.    Dispense:  120 tablet    Refill:  0   .  Follow-Up Instructions: Return in 3 months (on 04/18/2020) for Rheumatoid arthritis, Osteoarthritis, Fibromyalgia.   Bo Merino, MD  Note - This record has been created using Editor, commissioning.  Chart creation errors have been sought, but may not always  have been located. Such creation errors do not reflect on  the standard of medical care.

## 2020-01-18 LAB — CBC WITH DIFFERENTIAL/PLATELET
Absolute Monocytes: 603 cells/uL (ref 200–950)
Basophils Absolute: 41 cells/uL (ref 0–200)
Basophils Relative: 0.7 %
Eosinophils Absolute: 191 cells/uL (ref 15–500)
Eosinophils Relative: 3.3 %
HCT: 41.3 % (ref 35.0–45.0)
Hemoglobin: 13.7 g/dL (ref 11.7–15.5)
Lymphs Abs: 1688 cells/uL (ref 850–3900)
MCH: 30.4 pg (ref 27.0–33.0)
MCHC: 33.2 g/dL (ref 32.0–36.0)
MCV: 91.8 fL (ref 80.0–100.0)
MPV: 10.8 fL (ref 7.5–12.5)
Monocytes Relative: 10.4 %
Neutro Abs: 3277 cells/uL (ref 1500–7800)
Neutrophils Relative %: 56.5 %
Platelets: 336 10*3/uL (ref 140–400)
RBC: 4.5 10*6/uL (ref 3.80–5.10)
RDW: 12.7 % (ref 11.0–15.0)
Total Lymphocyte: 29.1 %
WBC: 5.8 10*3/uL (ref 3.8–10.8)

## 2020-01-18 LAB — COMPLETE METABOLIC PANEL WITH GFR
AG Ratio: 1.7 (calc) (ref 1.0–2.5)
ALT: 30 U/L — ABNORMAL HIGH (ref 6–29)
AST: 27 U/L (ref 10–35)
Albumin: 4.5 g/dL (ref 3.6–5.1)
Alkaline phosphatase (APISO): 94 U/L (ref 37–153)
BUN: 13 mg/dL (ref 7–25)
CO2: 27 mmol/L (ref 20–32)
Calcium: 10.2 mg/dL (ref 8.6–10.4)
Chloride: 103 mmol/L (ref 98–110)
Creat: 0.85 mg/dL (ref 0.50–0.99)
GFR, Est African American: 85 mL/min/{1.73_m2} (ref >=60)
GFR, Est Non African American: 73 mL/min/{1.73_m2} (ref >=60)
Globulin: 2.6 g/dL (calc) (ref 1.9–3.7)
Glucose, Bld: 78 mg/dL (ref 65–99)
Potassium: 4 mmol/L (ref 3.5–5.3)
Sodium: 140 mmol/L (ref 135–146)
Total Bilirubin: 0.7 mg/dL (ref 0.2–1.2)
Total Protein: 7.1 g/dL (ref 6.1–8.1)

## 2020-01-18 NOTE — Progress Notes (Signed)
Labs are stable.

## 2020-01-24 ENCOUNTER — Ambulatory Visit
Admission: RE | Admit: 2020-01-24 | Discharge: 2020-01-24 | Disposition: A | Discharge status: Home / Self Care | Payer: Medicare PPO | Source: Ambulatory Visit | Attending: Adult Health | Admitting: Adult Health

## 2020-01-24 ENCOUNTER — Other Ambulatory Visit: Payer: Self-pay

## 2020-01-24 DIAGNOSIS — N281 Cyst of kidney, acquired: Secondary | ICD-10-CM

## 2020-01-24 DIAGNOSIS — K8689 Other specified diseases of pancreas: Secondary | ICD-10-CM | POA: Diagnosis not present

## 2020-01-24 DIAGNOSIS — D1809 Hemangioma of other sites: Secondary | ICD-10-CM | POA: Diagnosis not present

## 2020-01-24 DIAGNOSIS — N2889 Other specified disorders of kidney and ureter: Secondary | ICD-10-CM | POA: Diagnosis not present

## 2020-01-24 MED ORDER — GADOBENATE DIMEGLUMINE 529 MG/ML IV SOLN
17.0000 mL | Freq: Once | INTRAVENOUS | Status: AC | PRN
  Administered 2020-01-24: 17 mL via INTRAVENOUS

## 2020-01-24 NOTE — Progress Notes (Deleted)
   Subjective:    Patient ID: [name], female    DOB: [birth year], 64 y.o.   MRN: [id number]  HPI 64 y.o. WF presents for weight loss management and for evaluation after MRI of kidneys due to complicated left kidney cyst.  She was put on phentermine last visit but did have AE's with it, was going to try 1/2 pill daily and I attempted to talk with her psych NP but have not heard back about the addition of topmax for weight loss.   There were no vitals taken for this visit.  Medications   Current Outpatient Medications (Cardiovascular):  .  hydrochlorothiazide (HYDRODIURIL) 25 MG tablet, Take 1 tablet Daily for BP & Fluid Retention / Ankle Swelling .  rosuvastatin (CRESTOR) 40 MG tablet, Take 1 tablet Daily for Cholesterol  Current Outpatient Medications (Respiratory):  .  cetirizine (ZYRTEC) 10 MG tablet, Take 10 mg by mouth daily.   Current Outpatient Medications (Analgesics):  .  aspirin EC 81 MG tablet, Take 81 mg by mouth daily. .  traMADol (ULTRAM) 50 MG tablet, Use 1 to 2 tablets every 4 hours as needed for Severe Pain   Current Outpatient Medications (Other):  Marland Kitchen  Cholecalciferol (VITAMIN D PO), Take 8,000 Int'l Units by mouth daily. 2000 units per capsule .  clonazePAM (KLONOPIN) 1 MG tablet, Take 1 mg by mouth at bedtime as needed for anxiety.  Marland Kitchen  glucose blood (ACCU-CHEK AVIVA PLUS) test strip, CHECK BLOOD SUGAR 1 TIME DAILY. DX-R73.03 .  hydroxychloroquine (PLAQUENIL) 200 MG tablet, Take 1 tablet by mouth twice daily, Monday through Friday only. .  lamoTRIgine (LAMICTAL) 150 MG tablet, Take 1 & 1/2 tablets at bedtime (Patient taking differently: Take 150 mg by mouth at bedtime. ) .  Lancets (ACCU-CHEK MULTICLIX) lancets, Use to check blood glucose daily .  Lysine 500 MG TABS, Take 1,000 mg by mouth daily. Currently taking 2 pill QD .  Magnesium 500 MG TABS, Take 3,000 mg by mouth 3 (three) times daily. Takes 1000mg  TID .  Multiple Vitamin (MULTIVITAMIN WITH MINERALS)  TABS tablet, Take 0.5 tablets by mouth 2 (two) times daily. .  phentermine (ADIPEX-P) 37.5 MG tablet, Take 1/2 to 1 tablet every morning for dieting & weightloss .  QUEtiapine (SEROQUEL) 100 MG tablet, Take 100 mg by mouth 3 (three) times daily.  Problem list She has Essential hypertension; Mixed hyperlipidemia; Vitamin D deficiency; Fibromyalgia; Depression, major, recurrent, in partial remission ([place]); Morbid obesity ([place]) - BMI 30+ with OSA; OSA on CPAP; Osteoarthritis of both knees; Encounter for Medicare annual wellness exam; History of prediabetes; Right foot pain; Acquired cyst of kidney; and Fatty liver on their problem list.   Review of Systems     Objective:   Physical Exam        Assessment & Plan:

## 2020-01-25 ENCOUNTER — Encounter: Payer: Self-pay | Admitting: Adult Health

## 2020-01-29 ENCOUNTER — Ambulatory Visit: Payer: Medicare PPO | Admitting: Physician Assistant

## 2020-02-07 DIAGNOSIS — L570 Actinic keratosis: Secondary | ICD-10-CM | POA: Diagnosis not present

## 2020-02-07 DIAGNOSIS — L723 Sebaceous cyst: Secondary | ICD-10-CM | POA: Diagnosis not present

## 2020-02-07 DIAGNOSIS — D2230 Melanocytic nevi of unspecified part of face: Secondary | ICD-10-CM | POA: Diagnosis not present

## 2020-02-07 DIAGNOSIS — D2239 Melanocytic nevi of other parts of face: Secondary | ICD-10-CM | POA: Diagnosis not present

## 2020-02-07 DIAGNOSIS — L578 Other skin changes due to chronic exposure to nonionizing radiation: Secondary | ICD-10-CM | POA: Diagnosis not present

## 2020-02-07 DIAGNOSIS — D225 Melanocytic nevi of trunk: Secondary | ICD-10-CM | POA: Diagnosis not present

## 2020-02-07 DIAGNOSIS — D2272 Melanocytic nevi of left lower limb, including hip: Secondary | ICD-10-CM | POA: Diagnosis not present

## 2020-02-07 DIAGNOSIS — L821 Other seborrheic keratosis: Secondary | ICD-10-CM | POA: Diagnosis not present

## 2020-02-14 DIAGNOSIS — G4733 Obstructive sleep apnea (adult) (pediatric): Secondary | ICD-10-CM | POA: Diagnosis not present

## 2020-02-27 ENCOUNTER — Ambulatory Visit: Payer: Medicare PPO | Admitting: Internal Medicine

## 2020-03-03 ENCOUNTER — Encounter: Payer: Self-pay | Admitting: Internal Medicine

## 2020-03-03 NOTE — Progress Notes (Signed)
History of Present Illness:      This very nice 64 y.o.  MWF presents for 6  month follow up with HTN, HLD, Pre-Diabetes and Vitamin D Deficiency.        Patient is treated for HTN & BP has been controlled at home. Today's BP is at goal - 110/72. Patient has had no complaints of any cardiac type chest pain, palpitations, dyspnea / orthopnea / PND, dizziness, claudication, or dependent edema. Patient is on Plaquenil for Rheumatoid Arthritis (08/2019)    followed by Dr Cy Blamer. Patient is also on CPAP for OSA with improved restorative sleep.        Patient has been on SS Disability since age 59 (1997) for Panic /Anxiety Attacks and Depression.        Hyperlipidemia is controlled with diet & Rosuvastatin. Patient denies myalgias or other med SE's. Last Lipids were at goal:  Lab Results  Component Value Date   CHOL 136 11/29/2019   HDL 50 11/29/2019   LDLCALC 64 11/29/2019   TRIG 132 11/29/2019   CHOLHDL 2.7 11/29/2019    Also, the patient has history of PreDiabetes (A1c 5.7% /2010) and has had no symptoms of reactive hypoglycemia, diabetic polys, paresthesias or visual blurring.  Last A1c was not at goal:  Lab Results  Component Value Date   HGBA1C 6.1 (H) 08/23/2019       Further, the patient also has history of Vitamin D Deficiency ("23" / 2008) and supplements vitamin D without any suspected side-effects. Last vitamin D was at goal:   Lab Results  Component Value Date   VD25OH 71 08/23/2019    Current Outpatient Medications on File Prior to Visit  Medication Sig  . aspirin EC 81 MG tablet Take 81 mg by mouth daily.  . cetirizine (ZYRTEC) 10 MG tablet Take 10 mg by mouth daily.   . Cholecalciferol (VITAMIN D PO) Take 8,000 Int'l Units by mouth daily. 2000 units per capsule  . clonazePAM (KLONOPIN) 1 MG tablet Take 1 mg by mouth at bedtime as needed for anxiety.   Marland Kitchen glucose blood (ACCU-CHEK AVIVA PLUS) test strip CHECK BLOOD SUGAR 1 TIME DAILY. DX-R73.03  .  hydrochlorothiazide (HYDRODIURIL) 25 MG tablet Take 1 tablet Daily for BP & Fluid Retention / Ankle Swelling  . hydroxychloroquine (PLAQUENIL) 200 MG tablet Take 1 tablet by mouth twice daily, Monday through Friday only.  . Lancets (ACCU-CHEK MULTICLIX) lancets Use to check blood glucose daily  . Lysine 500 MG TABS Take 1,000 mg by mouth daily. Currently taking 2 pill QD  . Magnesium 500 MG TABS Take 3,000 mg by mouth 3 (three) times daily. Takes 1000mg  TID  . Multiple Vitamin (MULTIVITAMIN WITH MINERALS) TABS tablet Take 0.5 tablets by mouth 2 (two) times daily.  . QUEtiapine (SEROQUEL) 100 MG tablet Take 100 mg by mouth 3 (three) times daily.  . rosuvastatin (CRESTOR) 40 MG tablet Take 1 tablet Daily for Cholesterol  . traMADol (ULTRAM) 50 MG tablet Use 1 to 2 tablets every 4 hours as needed for Severe Pain  . lamoTRIgine (LAMICTAL) 150 MG tablet Take 1 & 1/2 tablets at bedtime (Patient taking differently: Take 150 mg by mouth at bedtime. )   No current facility-administered medications on file prior to visit.    Allergies  Allergen Reactions  . Latex     Band aids= if left on for extended period of time  . Celecoxib     REACTION: Rash (celebrex)  .  Cephalexin     REACTION: Rash (keflex)  . Codeine     REACTION: Rash  . Erythromycin     REACTION: Rash (emycin)  . Sulfonamide Derivatives     REACTION: Rash  . Trazodone And Nefazodone     insomnia  . 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 Arthritis   . Depression   . Elevated hemoglobin A1c   . Fibromyalgia   . Hypertension   . Other abnormal glucose 08/09/2013  . S/P total knee arthroplasty, left 01/07/2019  . Simple renal cyst 12/12/2019   Incidentally noted on abd [place] 11/2019 - radiologist recommended further characterization with MRI w & w/o contrast   Hypoechoic lesions within the kidney which may simply represent mildly complicated cysts. The largest of  these lies in the midportion of the kidney with increased echogenicity within and suggestion of internal color flow which may be related to septation. MRI of the kidneys with and w  . Sleep apnea    cpap   > 4 yrs  last sleep study  . Status post total shoulder arthroplasty, left 01/07/2019  . Unspecified essential hypertension 08/09/2013  . Unspecified vitamin D deficiency 08/09/2013  . Vitamin D deficiency     Immunization History  Administered Date(s) Administered  . DT (Pediatric) 07/05/2015  . Influenza Inj Mdck Quad With Preservative 05/28/2017, 04/13/2018  . Influenza Split 04/06/2012, 04/05/2013, 05/16/2014, 03/21/2015  . Influenza,inj,Quad PF,6+ Mos 03/16/2019  . Influenza,inj,quad, With Preservative 04/29/2016  . Pneumococcal Conjugate-13 05/16/2014  . Pneumococcal-Unspecified 06/22/1998  . Td 06/22/2004  . Zoster 10/21/2015    Past Surgical History:  Procedure Laterality Date  . ABDOMINAL HYSTERECTOMY  2002   Dr. Brien Mates   . COLONOSCOPY  2009  . JOINT REPLACEMENT     arthroscopy  . KNEE ARTHROPLASTY    . TOTAL KNEE ARTHROPLASTY Right 04/14/2013   Procedure: RIGHT TOTAL KNEE ARTHROPLASTY AND LEFT KNEE INJECTION ;  Surgeon: Alta Corning, MD;  Location: [facility];  Service: Orthopedics;  Laterality: Right;  . TOTAL KNEE ARTHROPLASTY Left 01/06/2019   Procedure: TOTAL KNEE ARTHROPLASTY;  Surgeon: Dorna Leitz, MD;  Location: [facility];  Service: Orthopedics;  Laterality: Left;    FHx:    Reviewed / unchanged  SHx:    Reviewed / unchanged   Systems Review:  Constitutional: Denies fever, chills, wt changes, headaches, insomnia, fatigue, night sweats, change in appetite. Eyes: Denies redness, blurred vision, diplopia, discharge, itchy, watery eyes.  ENT: Denies discharge, congestion, post nasal drip, epistaxis, sore throat, earache, hearing loss, dental pain, tinnitus, vertigo, sinus pain, snoring.  CV: Denies chest pain, palpitations, irregular heartbeat, syncope, dyspnea,  diaphoresis, orthopnea, PND, claudication or edema. Respiratory: denies cough, dyspnea, DOE, pleurisy, hoarseness, laryngitis, wheezing.  Gastrointestinal: Denies dysphagia, odynophagia, heartburn, reflux, water brash, abdominal pain or cramps, nausea, vomiting, bloating, diarrhea, constipation, hematemesis, melena, hematochezia  or hemorrhoids. Genitourinary: Denies dysuria, frequency, urgency, nocturia, hesitancy, discharge, hematuria or flank pain. Musculoskeletal: Denies arthralgias, myalgias, stiffness, jt. swelling, pain, limping or strain/sprain.  Skin: Denies pruritus, rash, hives, warts, acne, eczema or change in skin lesion(s). Neuro: No weakness, tremor, incoordination, spasms, paresthesia or pain. Psychiatric: Denies confusion, memory loss or sensory loss. Endo: Denies change in weight, skin or hair change.  Heme/Lymph: No excessive bleeding, bruising or enlarged lymph nodes.  Physical Exam  BP 110/72   Pulse 80   Temp (!) 96.7 F (35.9 C)  Resp 16   Ht 5\' 5"  (1.651 m)   Wt 185 lb 9.6 oz (84.2 kg)   BMI 30.89 kg/m   Appears  over nourished, well groomed  and in no distress.  Eyes: PERRLA, EOMs, conjunctiva no swelling or erythema. Sinuses: No frontal/maxillary tenderness ENT/Mouth: EAC's clear, TM's nl w/o erythema, bulging. Nares clear w/o erythema, swelling, exudates. Oropharynx clear without erythema or exudates. Oral hygiene is good. Tongue normal, non obstructing. Hearing intact.  Neck: Supple. Thyroid not palpable. Car 2+/2+ without bruits, nodes or JVD. Chest: Respirations nl with BS clear & equal w/o rales, rhonchi, wheezing or stridor.  Cor: Heart sounds normal w/ regular rate and rhythm without sig. murmurs, gallops, clicks or rubs. Peripheral pulses normal and equal  without edema.  Abdomen: Soft & bowel sounds normal. Non-tender w/o guarding, rebound, hernias, masses or organomegaly.  Lymphatics: Unremarkable.  Musculoskeletal: Full ROM all peripheral  extremities, joint stability, 5/5 strength and normal gait.  Skin: Warm, dry without exposed rashes, lesions or ecchymosis apparent.  Neuro: Cranial nerves intact, reflexes equal bilaterally. Sensory-motor testing grossly intact. Tendon reflexes grossly intact.  Pysch: Alert & oriented x 3.  Insight and judgement nl & appropriate. No ideations.  Assessment and Plan:  1. Essential hypertension  - Continue medication, monitor blood pressure at home.  - Continue DASH diet.  Reminder to go to the ER if any CP,  SOB, nausea, dizziness, severe HA, changes vision/speech.  - CBC with Differential/Platelet - COMPLETE METABOLIC PANEL WITH GFR - Magnesium - TSH  2. Hyperlipemia, mixed  - Continue diet/meds, exercise,& lifestyle modifications.  - Continue monitor periodic cholesterol/liver & renal functions   - Lipid panel - TSH  3. Abnormal glucose  - Continue diet, exercise  - Lifestyle modifications.  - Monitor appropriate labs.  - Hemoglobin A1c - Insulin, random  4. Vitamin D deficiency  - Continue supplementation.  - VITAMIN D 25 Hydroxy  5. Prediabetes  - Hemoglobin A1c - Insulin, random  6. Medication management  - CBC with Differential/Platelet - COMPLETE METABOLIC PANEL WITH GFR - Magnesium - Lipid panel - TSH - Hemoglobin A1c - Insulin, random - VITAMIN D 25 Hydroxy         Discussed  regular exercise, BP monitoring, weight control to achieve/maintain BMI less than 25 and discussed med and SE's. Recommended labs to assess and monitor clinical status with further disposition pending results of labs.  I discussed the assessment and treatment plan with the patient. The patient was provided an opportunity to ask questions and all were answered. The patient agreed with the plan and demonstrated an understanding of the instructions.  I provided over 30 minutes of exam, counseling, chart review and  complex critical decision making.   Kirtland Bouchard, MD

## 2020-03-03 NOTE — Patient Instructions (Signed)

## 2020-03-04 ENCOUNTER — Other Ambulatory Visit: Payer: Self-pay

## 2020-03-04 ENCOUNTER — Ambulatory Visit (INDEPENDENT_AMBULATORY_CARE_PROVIDER_SITE_OTHER): Payer: Medicare PPO | Admitting: Internal Medicine

## 2020-03-04 VITALS — BP 110/72 | HR 80 | Temp 96.7°F | Resp 16 | Ht 65.0 in | Wt 185.6 lb

## 2020-03-04 DIAGNOSIS — R7309 Other abnormal glucose: Secondary | ICD-10-CM

## 2020-03-04 DIAGNOSIS — R7303 Prediabetes: Secondary | ICD-10-CM

## 2020-03-04 DIAGNOSIS — Z79899 Other long term (current) drug therapy: Secondary | ICD-10-CM

## 2020-03-04 DIAGNOSIS — E559 Vitamin D deficiency, unspecified: Secondary | ICD-10-CM

## 2020-03-04 DIAGNOSIS — E782 Mixed hyperlipidemia: Secondary | ICD-10-CM

## 2020-03-04 DIAGNOSIS — I10 Essential (primary) hypertension: Secondary | ICD-10-CM | POA: Diagnosis not present

## 2020-03-05 LAB — CBC WITH DIFFERENTIAL/PLATELET
Absolute Monocytes: 491 cells/uL (ref 200–950)
Basophils Absolute: 22 cells/uL (ref 0–200)
Basophils Relative: 0.4 %
Eosinophils Absolute: 151 cells/uL (ref 15–500)
Eosinophils Relative: 2.8 %
HCT: 41.2 % (ref 35.0–45.0)
Hemoglobin: 14 g/dL (ref 11.7–15.5)
Lymphs Abs: 1598 cells/uL (ref 850–3900)
MCH: 31.1 pg (ref 27.0–33.0)
MCHC: 34 g/dL (ref 32.0–36.0)
MCV: 91.6 fL (ref 80.0–100.0)
MPV: 10.7 fL (ref 7.5–12.5)
Monocytes Relative: 9.1 %
Neutro Abs: 3137 cells/uL (ref 1500–7800)
Neutrophils Relative %: 58.1 %
Platelets: 306 10*3/uL (ref 140–400)
RBC: 4.5 10*6/uL (ref 3.80–5.10)
RDW: 12.8 % (ref 11.0–15.0)
Total Lymphocyte: 29.6 %
WBC: 5.4 10*3/uL (ref 3.8–10.8)

## 2020-03-05 LAB — HEMOGLOBIN A1C
Hgb A1c MFr Bld: 5.7 % of total Hgb — ABNORMAL HIGH (ref <5.7)
Mean Plasma Glucose: 117 (calc)
eAG (mmol/L): 6.5 (calc)

## 2020-03-05 LAB — COMPLETE METABOLIC PANEL WITH GFR
AG Ratio: 1.6 (calc) (ref 1.0–2.5)
ALT: 29 U/L (ref 6–29)
AST: 25 U/L (ref 10–35)
Albumin: 4.5 g/dL (ref 3.6–5.1)
Alkaline phosphatase (APISO): 91 U/L (ref 37–153)
BUN: 15 mg/dL (ref 7–25)
CO2: 29 mmol/L (ref 20–32)
Calcium: 10 mg/dL (ref 8.6–10.4)
Chloride: 103 mmol/L (ref 98–110)
Creat: 0.75 mg/dL (ref 0.50–0.99)
GFR, Est African American: 98 mL/min/{1.73_m2} (ref >=60)
GFR, Est Non African American: 85 mL/min/{1.73_m2} (ref >=60)
Globulin: 2.8 g/dL (calc) (ref 1.9–3.7)
Glucose, Bld: 97 mg/dL (ref 65–99)
Potassium: 3.9 mmol/L (ref 3.5–5.3)
Sodium: 140 mmol/L (ref 135–146)
Total Bilirubin: 0.5 mg/dL (ref 0.2–1.2)
Total Protein: 7.3 g/dL (ref 6.1–8.1)

## 2020-03-05 LAB — INSULIN, RANDOM: Insulin: 44.1 u[IU]/mL — ABNORMAL HIGH

## 2020-03-05 LAB — MAGNESIUM: Magnesium: 2.2 mg/dL (ref 1.5–2.5)

## 2020-03-05 LAB — LIPID PANEL
Cholesterol: 137 mg/dL (ref <200)
HDL: 55 mg/dL (ref >=50)
LDL Cholesterol (Calc): 59 mg/dL (calc)
Non-HDL Cholesterol (Calc): 82 mg/dL (calc) (ref <130)
Total CHOL/HDL Ratio: 2.5 (calc) (ref <5.0)
Triglycerides: 157 mg/dL — ABNORMAL HIGH (ref <150)

## 2020-03-05 LAB — TSH: TSH: 1.6 mIU/L (ref 0.40–4.50)

## 2020-03-05 LAB — VITAMIN D 25 HYDROXY (VIT D DEFICIENCY, FRACTURES): Vit D, 25-Hydroxy: 69 ng/mL (ref 30–100)

## 2020-03-05 NOTE — Progress Notes (Signed)
========================================================== -   Test results slightly outside the reference range are not unusual. If there is anything important, I will review this with you,  otherwise it is considered normal test values.  If you have further questions,  please do not hesitate to contact me at the office or via My Chart.  ==========================================================  -  Total Chol = 137 and LDL Chol = 59 - Both  Excellent   - Very low risk for Heart Attack  / Stroke ==========================================================  - A1c much better - Down from 6.1% to now 5.7% - almost back in Normal Non-Diabetic range. ==========================================================  - Vitamin D level = 69 - Excellent  ==========================================================  - All Else - CBC - Kidneys - Electrolytes - Liver - Magnesium & Thyroid    - all  Normal / OK ==========================================================   -Keep up the [place] Work  ! ==========================================================

## 2020-04-04 NOTE — Progress Notes (Signed)
Office Visit Note  Patient: [name]             Date of Birth: 06/04/1956           MRN: [id number]             PCP: Unk Pinto, MD Referring: Unk Pinto, MD Visit Date: 04/17/2020 Occupation: _0 @  Subjective:  Pain in both hands, neck pain   History of Present Illness: [name] is a 64 y.o. female with history of rheumatoid arthritis, osteoarthritis and fibromyalgia.  She states recently she has been experiencing increased  discomfort in her hands.  She has not seen any joint joint swelling but she mentions joint deformity.  She also strained her neck and was experiencing some numbness in her left ring finger and fifth finger .  She denies discomfort in any other joints.  Activities of Daily Living:  Patient reports morning stiffness for 10  minutes.   Patient Reports nocturnal pain.  Difficulty dressing/grooming: Denies Difficulty climbing stairs: Denies Difficulty getting out of chair: Reports Difficulty using hands for taps, buttons, cutlery, and/or writing: Reports  Review of Systems  Constitutional: Positive for fatigue.  HENT: Positive for mouth dryness. Negative for mouth sores and nose dryness.   Eyes: Positive for dryness. Negative for pain and itching.  Respiratory: Negative for shortness of breath and difficulty breathing.   Cardiovascular: Negative for chest pain and palpitations.  Gastrointestinal: Negative for blood in stool, constipation and diarrhea.  Endocrine: Negative for increased urination.  Genitourinary: Negative for difficulty urinating and painful urination.  Musculoskeletal: Positive for arthralgias, joint pain, joint swelling and morning stiffness. Negative for myalgias, muscle tenderness and myalgias.  Skin: Negative for color change, rash and redness.  Allergic/Immunologic: Negative for susceptible to infections.  Neurological: Positive for numbness and headaches. Negative for dizziness, memory loss and weakness.    Hematological: Negative for bruising/bleeding tendency.  Psychiatric/Behavioral: Positive for decreased concentration. Negative for confusion.    PMFS History:  Patient Active Problem List   Diagnosis Date Noted  . Fatty liver 12/12/2019  . Right foot pain 11/29/2019  . History of prediabetes 05/23/2019  . Osteoarthritis of both knees 03/20/2015  . Encounter for Medicare annual wellness exam 03/20/2015  . OSA on CPAP 08/15/2014  . Fibromyalgia 01/31/2014  . Depression, major, recurrent, in partial remission ([place]) 01/31/2014  . Morbid obesity ([place]) - BMI 30+ with OSA 01/31/2014  . Essential hypertension 08/09/2013  . Mixed hyperlipidemia 08/09/2013  . Vitamin D deficiency 08/09/2013    Past Medical History:  Diagnosis Date  . Arthritis   . Depression   . Elevated hemoglobin A1c   . Fibromyalgia   . Hypertension   . Other abnormal glucose 08/09/2013  . S/P total knee arthroplasty, left 01/07/2019  . Simple renal cyst 12/12/2019   Incidentally noted on abd [place] 11/2019 - radiologist recommended further characterization with MRI w & w/o contrast   Hypoechoic lesions within the kidney which may simply represent mildly complicated cysts. The largest of these lies in the midportion of the kidney with increased echogenicity within and suggestion of internal color flow which may be related to septation. MRI of the kidneys with and w  . Sleep apnea    cpap   > 4 yrs  last sleep study  . Status post total shoulder arthroplasty, left 01/07/2019  . Unspecified essential hypertension 08/09/2013  . Unspecified vitamin D deficiency 08/09/2013  . Vitamin D deficiency     Family History  Problem Relation Age of Onset  . Hypertension Mother   . Heart attack Mother   . Hypertension Father   . COPD Father   . Cancer Father        thyroid  . CVA Maternal Grandmother 69  . Heart attack Maternal Grandfather 59  . Deep vein thrombosis Maternal Grandfather   . Colon cancer Neg Hx   . Esophageal  cancer Neg Hx   . Stomach cancer Neg Hx    Past Surgical History:  Procedure Laterality Date  . ABDOMINAL HYSTERECTOMY  2002   Dr. Brien Mates   . COLONOSCOPY  2009  . JOINT REPLACEMENT     arthroscopy  . KNEE ARTHROPLASTY    . TOTAL KNEE ARTHROPLASTY Right 04/14/2013   Procedure: RIGHT TOTAL KNEE ARTHROPLASTY AND LEFT KNEE INJECTION ;  Surgeon: Alta Corning, MD;  Location: [facility];  Service: Orthopedics;  Laterality: Right;  . TOTAL KNEE ARTHROPLASTY Left 01/06/2019   Procedure: TOTAL KNEE ARTHROPLASTY;  Surgeon: Dorna Leitz, MD;  Location: [facility];  Service: Orthopedics;  Laterality: Left;   Social History   Social History Narrative  . Not on file   Immunization History  Administered Date(s) Administered  . DT (Pediatric) 07/05/2015  . Influenza Inj Mdck Quad With Preservative 05/28/2017, 04/13/2018  . Influenza Split 04/06/2012, 04/05/2013, 05/16/2014, 03/21/2015  . Influenza,inj,Quad PF,6+ Mos 03/16/2019  . Influenza,inj,quad, With Preservative 04/29/2016  . PFIZER SARS-COV-2 Vaccination 11/08/2019, 11/29/2019  . Pneumococcal Conjugate-13 05/16/2014  . Pneumococcal-Unspecified 06/22/1998  . Td 06/22/2004  . Zoster 10/21/2015     Objective: Vital Signs: BP 135/70 (BP Location: Left Arm, Patient Position: Sitting, Cuff Size: Normal)   Pulse 82   Resp 16   Ht _0  (1.626 m)   Wt 188 lb (85.3 kg)   BMI 32.27 kg/m    Physical Exam Vitals and nursing note reviewed.  Constitutional:      Appearance: She is well-developed.  HENT:     Head: Normocephalic and atraumatic.  Eyes:     Conjunctiva/sclera: Conjunctivae normal.  Cardiovascular:     Rate and Rhythm: Normal rate and regular rhythm.     Heart sounds: Normal heart sounds.  Pulmonary:     Effort: Pulmonary effort is normal.     Breath sounds: Normal breath sounds.  Abdominal:     General: Bowel sounds are normal.     Palpations: Abdomen is soft.  Musculoskeletal:     Cervical back: Normal range of motion.    Lymphadenopathy:     Cervical: No cervical adenopathy.  Skin:    General: Skin is warm and dry.     Capillary Refill: Capillary refill takes less than 2 seconds.  Neurological:     Mental Status: She is alert and oriented to person, place, and time.  Psychiatric:        Behavior: Behavior normal.      Musculoskeletal Exam: C-spine was in good range of motion.  Shoulder joints, elbow joints, wrist joints with good range of motion.  She has bilateral PIP and DIP thickening with no synovitis.  Hip joints, knee joints, ankles with good range of motion.  She had bilateral knee replacement and had bilateral dorsal spurs.  CDAI Exam: CDAI Score: 0.4  Patient Global: 2 mm; Provider Global: 2 mm Swollen: 0 ; Tender: 0  Joint Exam 04/17/2020   No joint exam has been documented for this visit   There is currently no information documented on the homunculus. Go to the Rheumatology  activity and complete the homunculus joint exam.  Investigation: No additional findings.  Imaging: No results found.  Recent Labs: Lab Results  Component Value Date   WBC 5.4 03/04/2020   HGB 14.0 03/04/2020   PLT 306 03/04/2020   NA 140 03/04/2020   K 3.9 03/04/2020   CL 103 03/04/2020   CO2 29 03/04/2020   GLUCOSE 97 03/04/2020   BUN 15 03/04/2020   CREATININE 0.75 03/04/2020   BILITOT 0.5 03/04/2020   ALKPHOS 89 01/03/2019   AST 25 03/04/2020   ALT 29 03/04/2020   PROT 7.3 03/04/2020   ALBUMIN 4.7 01/03/2019   CALCIUM 10.0 03/04/2020   GFRAA 98 03/04/2020    Speciality Comments: PLQ Eye Exam: 01/08/2020 WNL @ [facility] Follow up in 6 months.   Procedures:  No procedures performed Allergies: Latex, Celecoxib, Cephalexin, Codeine, Erythromycin, Sulfonamide derivatives, Trazodone and nefazodone, and Venlafaxine   Assessment / Plan:     Visit Diagnoses: Rheumatoid arthritis of multiple sites with negative rheumatoid factor ([place]) - RF-, +anti-CCP.  Patient had no synovitis on  examination.  She states she ran out of Plaquenil and has been experiencing increased discomfort.  We will refill Plaquenil.  Positive anti-CCP test - Anti-CCP 39 (weak positive), RF negative, ESR 6.   High risk medication use - plaquenil 266m po twice daily Monday to Friday. PLQ Eye Exam: 01/08/2020.  Her labs are stable.  She will get labs in 5 months.  Primary osteoarthritis of both hands-she has severe osteoarthritis in her hands which causes discomfort.  I have advised her to try arthritis close.  Primary osteoarthritis of both feet-dorsal spurring was noted.  Status post total bilateral knee replacement-doing well.  Fibromyalgia-she continues to have generalized pain and discomfort.  She may benefit from Cymbalta.  Have advised her to discuss that with Dr. MMelford Aase  Depression, major, recurrent, in partial remission (HCC)-she is on Lamictal.  Essential hypertension-her blood pressure is well controlled.  Mixed hyperlipidemia  History of prediabetes  Vitamin D deficiency  OSA on CPAP  Educate about COVID-19 virus infection-she is fully immunized against COVID-19.  Use of booster was discussed.  Use of mask, social distancing and hand hygiene was discussed.  Use of monoclonal antibodies were discussed in case she develops COVID-19 infection.  Orders: No orders of the defined types were placed in this encounter.  No orders of the defined types were placed in this encounter.    Follow-Up Instructions: Return in about 5 months (around 09/15/2020) for Rheumatoid arthritis, Osteoarthritis.   SBo Merino MD  Note - This record has been created using DEditor, commissioning  Chart creation errors have been sought, but may not always  have been located. Such creation errors do not reflect on  the standard of medical care.

## 2020-04-10 ENCOUNTER — Other Ambulatory Visit: Payer: Self-pay | Admitting: Rheumatology

## 2020-04-10 DIAGNOSIS — M0609 Rheumatoid arthritis without rheumatoid factor, multiple sites: Secondary | ICD-10-CM

## 2020-04-10 NOTE — Telephone Encounter (Addendum)
Last Visit: 01/17/2020 Next Visit: 04/17/2020 Labs: 03/04/2020 CBC/CMP WNL PLQ Eye Exam: 01/08/2020 WNL  Current Dose per office note on 01/17/2020: plaquenil 200mg  po twice daily. As she is doing well we will reduce the dose of Plaquenil to twice daily Monday to Friday. DX: Rheumatoid arthritis of multiple sites with negative rheumatoid factor   Okay to refill PLQ?

## 2020-04-17 ENCOUNTER — Other Ambulatory Visit: Payer: Self-pay

## 2020-04-17 ENCOUNTER — Encounter: Payer: Self-pay | Admitting: Rheumatology

## 2020-04-17 ENCOUNTER — Ambulatory Visit: Payer: Medicare PPO | Admitting: Rheumatology

## 2020-04-17 VITALS — BP 135/70 | HR 82 | Resp 16 | Ht 64.0 in | Wt 188.0 lb

## 2020-04-17 DIAGNOSIS — Z9989 Dependence on other enabling machines and devices: Secondary | ICD-10-CM

## 2020-04-17 DIAGNOSIS — M19042 Primary osteoarthritis, left hand: Secondary | ICD-10-CM

## 2020-04-17 DIAGNOSIS — Z7189 Other specified counseling: Secondary | ICD-10-CM

## 2020-04-17 DIAGNOSIS — M0609 Rheumatoid arthritis without rheumatoid factor, multiple sites: Secondary | ICD-10-CM | POA: Diagnosis not present

## 2020-04-17 DIAGNOSIS — Z96653 Presence of artificial knee joint, bilateral: Secondary | ICD-10-CM

## 2020-04-17 DIAGNOSIS — M19071 Primary osteoarthritis, right ankle and foot: Secondary | ICD-10-CM

## 2020-04-17 DIAGNOSIS — M19041 Primary osteoarthritis, right hand: Secondary | ICD-10-CM

## 2020-04-17 DIAGNOSIS — I10 Essential (primary) hypertension: Secondary | ICD-10-CM

## 2020-04-17 DIAGNOSIS — E782 Mixed hyperlipidemia: Secondary | ICD-10-CM

## 2020-04-17 DIAGNOSIS — E559 Vitamin D deficiency, unspecified: Secondary | ICD-10-CM

## 2020-04-17 DIAGNOSIS — F3341 Major depressive disorder, recurrent, in partial remission: Secondary | ICD-10-CM

## 2020-04-17 DIAGNOSIS — M797 Fibromyalgia: Secondary | ICD-10-CM | POA: Diagnosis not present

## 2020-04-17 DIAGNOSIS — Z79899 Other long term (current) drug therapy: Secondary | ICD-10-CM

## 2020-04-17 DIAGNOSIS — G4733 Obstructive sleep apnea (adult) (pediatric): Secondary | ICD-10-CM

## 2020-04-17 DIAGNOSIS — Z87898 Personal history of other specified conditions: Secondary | ICD-10-CM

## 2020-04-17 DIAGNOSIS — R768 Other specified abnormal immunological findings in serum: Secondary | ICD-10-CM

## 2020-04-17 DIAGNOSIS — M19072 Primary osteoarthritis, left ankle and foot: Secondary | ICD-10-CM

## 2020-04-17 NOTE — Patient Instructions (Signed)
Standing Labs We placed an order today for your standing lab work.   Please have your standing labs drawn in February and every 5 months  If possible, please have your labs drawn 2 weeks prior to your appointment so that the provider can discuss your results at your appointment.  We have open lab daily Monday through Thursday from 8:30-12:30 PM and 1:30-4:30 PM and Friday from 8:30-12:30 PM and 1:30-4:00 PM at the office of Dr. Bo Merino, [facility].   Please be advised, patients with office appointments requiring lab work will take precedents over walk-in lab work.  If possible, please come for your lab work on Monday and Friday afternoons, as you may experience shorter wait times. The office is located at [address] [place], [place], [place] [address] No appointment is necessary.   Labs are drawn by [facility]. Please bring your co-pay at the time of your lab draw.  You may receive a bill from [facility] for your lab work.  If you wish to have your labs drawn at another location, please call the office 24 hours in advance to send orders.  If you have any questions regarding directions or hours of operation,  please call [phone].   As a reminder, please drink plenty of water prior to coming for your lab work. Thanks!   COVID-19 vaccine recommendations:   COVID-19 vaccine is recommended for everyone (unless you are allergic to a vaccine component), even if you are on a medication that suppresses your immune system.    Do not take Tylenol or any anti-inflammatory medications (NSAIDs) 24 hours prior to the COVID-19 vaccination.   There is no direct evidence about the efficacy of the COVID-19 vaccine in individuals who are on medications that suppress the immune system.   Even if you are fully vaccinated, and you are on any medications that suppress your immune system, please continue to wear a mask, maintain at least six feet social distance and practice hand  hygiene.   If you develop a COVID-19 infection, please contact your PCP or our office to determine if you need monoclonal antibody infusion.  The booster vaccine is now available for immunocompromised patients.   Please see the following web sites for updated information.   [email or web address]

## 2020-05-05 ENCOUNTER — Other Ambulatory Visit: Payer: Self-pay | Admitting: Internal Medicine

## 2020-05-23 DIAGNOSIS — G4733 Obstructive sleep apnea (adult) (pediatric): Secondary | ICD-10-CM | POA: Diagnosis not present

## 2020-05-30 ENCOUNTER — Other Ambulatory Visit: Payer: Self-pay | Admitting: Internal Medicine

## 2020-05-30 DIAGNOSIS — Z0000 Encounter for general adult medical examination without abnormal findings: Secondary | ICD-10-CM

## 2020-06-03 ENCOUNTER — Ambulatory Visit: Payer: Medicare PPO | Admitting: Adult Health Nurse Practitioner

## 2020-06-03 ENCOUNTER — Other Ambulatory Visit: Payer: Self-pay

## 2020-06-03 ENCOUNTER — Encounter: Payer: Self-pay | Admitting: Adult Health Nurse Practitioner

## 2020-06-03 VITALS — BP 124/74 | HR 74 | Temp 97.7°F | Wt 188.0 lb

## 2020-06-03 DIAGNOSIS — F3341 Major depressive disorder, recurrent, in partial remission: Secondary | ICD-10-CM

## 2020-06-03 DIAGNOSIS — K760 Fatty (change of) liver, not elsewhere classified: Secondary | ICD-10-CM

## 2020-06-03 DIAGNOSIS — E782 Mixed hyperlipidemia: Secondary | ICD-10-CM

## 2020-06-03 DIAGNOSIS — I10 Essential (primary) hypertension: Secondary | ICD-10-CM

## 2020-06-03 DIAGNOSIS — R7309 Other abnormal glucose: Secondary | ICD-10-CM

## 2020-06-03 DIAGNOSIS — G4733 Obstructive sleep apnea (adult) (pediatric): Secondary | ICD-10-CM

## 2020-06-03 DIAGNOSIS — B009 Herpesviral infection, unspecified: Secondary | ICD-10-CM | POA: Diagnosis not present

## 2020-06-03 DIAGNOSIS — E559 Vitamin D deficiency, unspecified: Secondary | ICD-10-CM

## 2020-06-03 DIAGNOSIS — Z79899 Other long term (current) drug therapy: Secondary | ICD-10-CM

## 2020-06-03 DIAGNOSIS — Z9989 Dependence on other enabling machines and devices: Secondary | ICD-10-CM

## 2020-06-03 MED ORDER — ACYCLOVIR 400 MG PO TABS: 400.0000 mg | ORAL_TABLET | Freq: Three times a day (TID) | ORAL | 1 refills | Status: DC | PRN

## 2020-06-03 MED ORDER — ACYCLOVIR 5 % EX OINT: TOPICAL_OINTMENT | CUTANEOUS | 1 refills | Status: DC

## 2020-06-03 NOTE — Progress Notes (Signed)
FOLLOW UP 3 MONTH  [place] was seen today for follow-up.  Diagnoses and all orders for this visit:   Essential hypertension Continue current medications: HCTZ 25mg  Monitor blood pressure at home; call if consistently over 130/80 Continue DASH diet.   Reminder to go to the ER if any CP, SOB, nausea, dizziness, severe HA, changes vision/speech, left arm numbness and tingling and jaw pain.  -     CBC with Differential/Platelet -     COMPLETE METABOLIC PANEL WITH GFR  Hyperlipemia, mixed Continue medications: Rosuvastatin 40mg  Discussed dietary and exercise modifications Low fat diet   Depression, major, recurrent, in partial remission (HCC) Continue medications: Seroquel, lamictal 150mg  1.5 tabs at bedtime Discussed stress management techniques  Discussed, increase water,intake & good sleep hygiene, using clonazepam 1mg  PRN, not using most nights Discussed increasing exercise & vegetables in diet  HSV infection -     acyclovir (ZOVIRAX) 400 MG tablet; Take 1 tablet (400 mg total) by mouth 3 (three) times daily as needed (fever blisters). -     acyclovir ointment (ZOVIRAX) 5 %; Apply thin layer to affected area  OSA on CPAP Continue CPAP/BiPAP, using nightly for at least 8 hours  Helping with daytime fatigue Weight loss still advised Discussed mask & tubing hygeine  Morbid obesity ([place]) - BMI 30+ with OSA Discussed dietary and exercise modifications  Abnormal glucose Fatty liver Discussed dietary and exercise modifications  Vitamin D deficiency Continue supplementation to maintain goal of 70-100 Taking Vitamin D 8,000 IU daily Defer vitamin D level   Medication management Continued   Further disposition pending results if labs check today. Discussed med's effects and SE's.   Over 30 minutes of face to face interview, exam, counseling, chart review, and critical decision making was performed.    _______________________________________________________________ HPI  64 y.o.female with history of obesity with OSA, HTN, chol presents for a follow up after being on phentermine for weight loss.   While on the medication they have lost 8 lbs since last visit which was 4 weeks ago.  She has had severe dry mouth since starting, some times trouble with swallowing spit.  She is taking magnesium, reports she is having daily bowel movements. She has been doing more hard candies for the dry mouth.  They deny palpitations, anxiety, trouble sleeping, elevated BP.   She is taking a zinc supplement and vitamin C.  She is follow with [facility] Q 22months for mood/depression/anxiety.  BMI is Body mass index is 32.27 kg/m., she is working on diet and exercise. Wt Readings from Last 3 Encounters:  06/03/20 188 lb (85.3 kg)  04/17/20 188 lb (85.3 kg)  03/04/20 185 lb 9.6 oz (84.2 kg)    Medications:   Current Outpatient Medications (Cardiovascular):    hydrochlorothiazide (HYDRODIURIL) 25 MG tablet, Take       1 tablet        Daily        for BP & Fluid Retention  / Ankle Swelling   rosuvastatin (CRESTOR) 40 MG tablet, Take 1 tablet Daily for Cholesterol (Patient taking differently: Take 20 mg by mouth daily. Take 1 tablet Daily for Cholesterol)  Current Outpatient Medications (Respiratory):    cetirizine (ZYRTEC) 10 MG tablet, Take 10 mg by mouth daily.   Current Outpatient Medications (Analgesics):    aspirin EC 81 MG tablet, Take 81 mg by mouth daily.   Current Outpatient Medications (Other):    Cholecalciferol (VITAMIN D PO), Take 8,000 Int'l Units by mouth daily. 2000  units per capsule   clonazePAM (KLONOPIN) 1 MG tablet, Take 1 mg by mouth at bedtime as needed for anxiety.    glucose blood (ACCU-CHEK AVIVA PLUS) test strip, CHECK BLOOD SUGAR 1 TIME DAILY. [id number]   hydroxychloroquine (PLAQUENIL) 200 MG tablet, TAKE 1 TABLET BY MOUTH TWICE DAILY MONDAY THROUGH FRIDAY  ONLY   Lancets (ACCU-CHEK MULTICLIX) lancets, Use to check blood glucose daily   Lysine 500 MG TABS, Take 1,000 mg by mouth daily. Currently taking 2 pill QD   Magnesium 500 MG TABS, Take 3,000 mg by mouth 3 (three) times daily. Takes 1000mg  TID   Multiple Vitamin (MULTIVITAMIN WITH MINERALS) TABS tablet, Take 0.5 tablets by mouth 2 (two) times daily.   QUEtiapine (SEROQUEL) 100 MG tablet, Take 100 mg by mouth 3 (three) times daily.   lamoTRIgine (LAMICTAL) 150 MG tablet, Take 1 & 1/2 tablets at bedtime (Patient taking differently: Take 1 tablet at bedtime)  ROS: All negative except for above  Physical exam: Vitals:   06/03/20 1427  BP: 124/74  Pulse: 74  Temp: 97.7 F (36.5 C)  SpO2: 98%   Physical Exam General appearance: alert, no distress, WD/WN, obese female HEENT: normocephalic, sclerae anicteric, TMs pearly, nares patent, no discharge or erythema, pharynx normal Oral cavity: MMM, no lesions Neck: supple, no lymphadenopathy, no thyromegaly, no masses Heart: RRR, normal S1, S2, no murmurs Lungs: CTA bilaterally, no wheezes, rhonchi, or rales Abdomen: +bs, soft, obese non tender, non distended, no masses, no hepatomegaly, no splenomegaly Musculoskeletal: nontender, no swelling, no obvious deformity, antalgic gait.  Extremities: no edema, no cyanosis, no clubbing Pulses: 2+ symmetric, upper and lower extremities, normal cap refill Neurological: alert, oriented x 3, CN2-12 intact, strength normal upper extremities and lower extremities, sensation normal throughout, DTRs 2+ throughout, no cerebellar signs, gait antalgit Psychiatric: normal affect, behavior normal, pleasant, somewhat anxious     Garnet Sierras, Laqueta Jean, DNP [facility] 06/03/2020  3:28 PM

## 2020-06-04 LAB — COMPLETE METABOLIC PANEL WITH GFR
AG Ratio: 1.4 (calc) (ref 1.0–2.5)
ALT: 28 U/L (ref 6–29)
AST: 23 U/L (ref 10–35)
Albumin: 4.3 g/dL (ref 3.6–5.1)
Alkaline phosphatase (APISO): 89 U/L (ref 37–153)
BUN: 20 mg/dL (ref 7–25)
CO2: 29 mmol/L (ref 20–32)
Calcium: 10.2 mg/dL (ref 8.6–10.4)
Chloride: 103 mmol/L (ref 98–110)
Creat: 0.78 mg/dL (ref 0.50–0.99)
GFR, Est African American: 94 mL/min/{1.73_m2} (ref >=60)
GFR, Est Non African American: 81 mL/min/{1.73_m2} (ref >=60)
Globulin: 3 g/dL (calc) (ref 1.9–3.7)
Glucose, Bld: 82 mg/dL (ref 65–99)
Potassium: 4.3 mmol/L (ref 3.5–5.3)
Sodium: 141 mmol/L (ref 135–146)
Total Bilirubin: 0.5 mg/dL (ref 0.2–1.2)
Total Protein: 7.3 g/dL (ref 6.1–8.1)

## 2020-06-04 LAB — CBC WITH DIFFERENTIAL/PLATELET
Absolute Monocytes: 502 cells/uL (ref 200–950)
Basophils Absolute: 37 cells/uL (ref 0–200)
Basophils Relative: 0.6 %
Eosinophils Absolute: 198 cells/uL (ref 15–500)
Eosinophils Relative: 3.2 %
HCT: 40.2 % (ref 35.0–45.0)
Hemoglobin: 13.7 g/dL (ref 11.7–15.5)
Lymphs Abs: 2145 cells/uL (ref 850–3900)
MCH: 30.7 pg (ref 27.0–33.0)
MCHC: 34.1 g/dL (ref 32.0–36.0)
MCV: 90.1 fL (ref 80.0–100.0)
MPV: 10.9 fL (ref 7.5–12.5)
Monocytes Relative: 8.1 %
Neutro Abs: 3317 cells/uL (ref 1500–7800)
Neutrophils Relative %: 53.5 %
Platelets: 328 10*3/uL (ref 140–400)
RBC: 4.46 10*6/uL (ref 3.80–5.10)
RDW: 12.7 % (ref 11.0–15.0)
Total Lymphocyte: 34.6 %
WBC: 6.2 10*3/uL (ref 3.8–10.8)

## 2020-06-24 ENCOUNTER — Encounter: Payer: Self-pay | Admitting: Adult Health Nurse Practitioner

## 2020-06-24 ENCOUNTER — Other Ambulatory Visit: Payer: Self-pay | Admitting: Adult Health Nurse Practitioner

## 2020-06-24 DIAGNOSIS — J0140 Acute pansinusitis, unspecified: Secondary | ICD-10-CM

## 2020-06-24 MED ORDER — DEXAMETHASONE 4 MG PO TABS: ORAL_TABLET | ORAL | 1 refills | Status: DC

## 2020-07-08 ENCOUNTER — Other Ambulatory Visit: Payer: Self-pay | Admitting: Physician Assistant

## 2020-07-08 DIAGNOSIS — M0609 Rheumatoid arthritis without rheumatoid factor, multiple sites: Secondary | ICD-10-CM

## 2020-07-08 NOTE — Telephone Encounter (Signed)
Last Visit: 10-/27/2021,  Next Visit: 08/27/2020 Labs: 06/03/2020, normal Eye exam: 01/08/2020 WNL 6 months, I called patient, patient will schedule PLQ eye exam.  Current Dose per office note 04/17/2020, plaquenil 200mg  po twice daily Monday to Friday [id number] arthritis of multiple sites with negative rheumatoid factor   Okay to refill Plaquenil?

## 2020-07-09 ENCOUNTER — Telehealth: Payer: Self-pay | Admitting: Rheumatology

## 2020-07-09 NOTE — Telephone Encounter (Signed)
Patient calling in reference to Plaquenil eye exam. Patient having a hard time finding a doctor that will take her insurance for the exam. She will be out of medication on Friday. Please call to advise.

## 2020-07-09 NOTE — Telephone Encounter (Signed)
Spoke with patient and she states she is waiting on a call back from another eye doctor she has reached out to. Patient also given phone number and information for [facility]. Patient advised her prescription was sent to the pharmacy on 07/08/2020 so she may continue to take her medication. Patient expressed understanding.

## 2020-07-10 ENCOUNTER — Telehealth: Payer: Self-pay

## 2020-07-10 ENCOUNTER — Other Ambulatory Visit: Payer: Self-pay | Admitting: Rheumatology

## 2020-07-10 DIAGNOSIS — M0609 Rheumatoid arthritis without rheumatoid factor, multiple sites: Secondary | ICD-10-CM

## 2020-07-10 NOTE — Telephone Encounter (Signed)
Spoke with pharmacist at [facility]. She states they have the prescription on file and will get it ready for the patient. She states it will be ready in about 90 minutes. Contacted patient to advise.

## 2020-07-10 NOTE — Telephone Encounter (Signed)
Patient left a voicemail stating she spoke with Seth Bake yesterday 07/09/20 who stated Dr. Estanislado Pandy approved her prescription of Plaquenil and it was sent to the pharmacy.  Patient states she checked with [facility] on [address] this morning 5/90/93 and was told they have not received approval from the doctor yet.  Patient is requesting a return call.

## 2020-07-15 ENCOUNTER — Ambulatory Visit
Admission: RE | Admit: 2020-07-15 | Discharge: 2020-07-15 | Disposition: A | Discharge status: Home / Self Care | Payer: Medicare PPO | Source: Ambulatory Visit | Attending: Internal Medicine | Admitting: Internal Medicine

## 2020-07-15 ENCOUNTER — Telehealth: Payer: Self-pay | Admitting: Rheumatology

## 2020-07-15 ENCOUNTER — Other Ambulatory Visit: Payer: Self-pay

## 2020-07-15 DIAGNOSIS — Z1231 Encounter for screening mammogram for malignant neoplasm of breast: Secondary | ICD-10-CM | POA: Diagnosis not present

## 2020-07-15 DIAGNOSIS — Z0000 Encounter for general adult medical examination without abnormal findings: Secondary | ICD-10-CM

## 2020-07-15 NOTE — Telephone Encounter (Signed)
Form faxed

## 2020-07-15 NOTE — Telephone Encounter (Signed)
Patient calling to request Plaquenil Eye Exam form to be faxed to eye doctor. Dr. Jilda Roche at fax # [phone]. Patient's appointment is July 24, 2020.

## 2020-07-24 DIAGNOSIS — H43813 Vitreous degeneration, bilateral: Secondary | ICD-10-CM | POA: Diagnosis not present

## 2020-07-24 DIAGNOSIS — R7303 Prediabetes: Secondary | ICD-10-CM | POA: Diagnosis not present

## 2020-07-24 DIAGNOSIS — Z79899 Other long term (current) drug therapy: Secondary | ICD-10-CM | POA: Diagnosis not present

## 2020-07-24 DIAGNOSIS — H2513 Age-related nuclear cataract, bilateral: Secondary | ICD-10-CM | POA: Diagnosis not present

## 2020-08-01 ENCOUNTER — Encounter: Payer: Self-pay | Admitting: Rheumatology

## 2020-08-01 DIAGNOSIS — Z79899 Other long term (current) drug therapy: Secondary | ICD-10-CM | POA: Diagnosis not present

## 2020-08-07 ENCOUNTER — Other Ambulatory Visit: Payer: Self-pay | Admitting: Internal Medicine

## 2020-08-29 ENCOUNTER — Telehealth: Payer: Self-pay

## 2020-08-29 ENCOUNTER — Other Ambulatory Visit: Payer: Self-pay | Admitting: *Deleted

## 2020-08-29 DIAGNOSIS — Z79899 Other long term (current) drug therapy: Secondary | ICD-10-CM

## 2020-08-29 NOTE — Telephone Encounter (Signed)
Lab order for CBC w/diff and CMP w/GRF faxed to Dr. Idell Pickles office. I called patient.

## 2020-08-29 NOTE — Telephone Encounter (Signed)
Patient called requesting her labwork orders be sent to her PCP Dr. Idell Pickles office.  Patient states she has an appointment on 09/02/20 and would like to include Dr. Tempie Hoist at that appointment.

## 2020-08-31 ENCOUNTER — Encounter: Payer: Self-pay | Admitting: Internal Medicine

## 2020-08-31 NOTE — Patient Instructions (Signed)
Due to recent changes in healthcare laws, you may see the results of your imaging and laboratory studies on MyChart before your provider has had a chance to review them.  We understand that in some cases there may be results that are confusing or concerning to you. Not all laboratory results come back in the same time frame and the provider may be waiting for multiple results in order to interpret others.  Please give [place] 48 hours in order for your provider to thoroughly review all the results before contacting the office for clarification of your results.   ++++++++++++++++++++++++++++++  Vit D  & Vit C 1,000 mg   are recommended to help protect  against the Covid-19 and other Corona viruses.    Also it's recommended  to take  Zinc 50 mg  to help  protect against the Covid-19   and best place to get  is also on [email or web address]  and don't pay more than 6-8 cents /pill !  ================================ Coronavirus (COVID-19) Are you at risk?  Are you at risk for the Coronavirus (COVID-19)?  To be considered HIGH RISK for Coronavirus (COVID-19), you have to meet the following criteria:  . Traveled to [place], [place], [place], [place] or [place]; or in the [place] to [place], [place], [place]  . or [place]; and have fever, cough, and shortness of breath within the last 2 weeks of travel OR . Been in close contact with a person diagnosed with COVID-19 within the last 2 weeks and have  . fever, cough,and shortness of breath .  . IF YOU DO NOT MEET THESE CRITERIA, YOU ARE CONSIDERED LOW RISK FOR COVID-19.  What to do if you are HIGH RISK for COVID-19?  Marland Kitchen If you are having a medical emergency, call 911. . Seek medical care right away. Before you go to a doctor's office, urgent care or emergency department, .  call ahead and tell them about your recent travel, contact with someone diagnosed with COVID-19  .  and your symptoms.  . You should receive instructions from your  physician's office regarding next steps of care.  . When you arrive at healthcare provider, tell the healthcare staff immediately you have returned from  . visiting [place], [place], [place], [place] or [place]; or traveled in the [place] to [place], [place],  . [place] or [place] in the last two weeks or you have been in close contact with a person diagnosed with  . COVID-19 in the last 2 weeks.   . Tell the health care staff about your symptoms: fever, cough and shortness of breath. . After you have been seen by a medical provider, you will be either: o Tested for (COVID-19) and discharged home on quarantine except to seek medical care if  o symptoms worsen, and asked to  - Stay home and avoid contact with others until you get your results (4-5 days)  - Avoid travel on public transportation if possible (such as bus, train, or airplane) or o Sent to the Emergency Department by EMS for evaluation, COVID-19 testing  and  o possible admission depending on your condition and test results.  What to do if you are LOW RISK for COVID-19?  Reduce your risk of any infection by using the same precautions used for avoiding the common cold or flu:  Marland Kitchen Wash your hands often with soap and warm water for at least 20 seconds.  If soap and water are not readily  available,  . use an alcohol-based hand sanitizer with at least 60% alcohol.  . If coughing or sneezing, cover your mouth and nose by coughing or sneezing into the elbow areas of your shirt or coat, .  into a tissue or into your sleeve (not your hands). . Avoid shaking hands with others and consider head nods or verbal greetings only. . Avoid touching your eyes, nose, or mouth with unwashed hands.  . Avoid close contact with people who are sick. . Avoid places or events with large numbers of people in one location, like concerts or sporting events. . Carefully consider travel plans you have or are making. . If you are planning any travel  outside or inside the [place], visit the CDC's Travelers' Health webpage for the latest health notices. . If you have some symptoms but not all symptoms, continue to monitor at home and seek medical attention  . if your symptoms worsen. . If you are having a medical emergency, call 911. >>>>>>>>>>>>>>>>>>>>>>> Preventive Care for Adults  A healthy lifestyle and preventive care can promote health and wellness. Preventive health guidelines for women include the following key practices.  A routine yearly physical is a good way to check with your health care provider about your health and preventive screening. It is a chance to share any concerns and updates on your health and to receive a thorough exam.  Visit your dentist for a routine exam and preventive care every 6 months. Brush your teeth twice a day and floss once a day. Good oral hygiene prevents tooth decay and gum disease.  The frequency of eye exams is based on your age, health, family medical history, use of contact lenses, and other factors. Follow your health care provider's recommendations for frequency of eye exams.  Eat a healthy diet. Foods like vegetables, fruits, whole grains, low-fat dairy products, and lean protein foods contain the nutrients you need without too many calories. Decrease your intake of foods high in solid fats, added sugars, and salt. Eat the right amount of calories for you. Get information about a proper diet from your health care provider, if necessary.  Regular physical exercise is one of the most important things you can do for your health. Most adults should get at least 150 minutes of moderate-intensity exercise (any activity that increases your heart rate and causes you to sweat) each week. In addition, most adults need muscle-strengthening exercises on 2 or more days a week.  Maintain a healthy weight. The body mass index (BMI) is a screening tool to identify possible weight problems. It provides an estimate  of body fat based on height and weight. Your health care provider can find your BMI and can help you achieve or maintain a healthy weight. For adults 20 years and older:  A BMI below 18.5 is considered underweight.  A BMI of 18.5 to 24.9 is normal.  A BMI of 25 to 29.9 is considered overweight.  A BMI of 30 and above is considered obese.  Maintain normal blood lipids and cholesterol levels by exercising and minimizing your intake of saturated fat. Eat a balanced diet with plenty of fruit and vegetables. Blood tests for lipids and cholesterol should begin at age 80 and be repeated every 5 years. If your lipid or cholesterol levels are high, you are over 50, or you are at high risk for heart disease, you may need your cholesterol levels checked more frequently. Ongoing high lipid and cholesterol levels should be treated with  medicines if diet and exercise are not working.  If you smoke, find out from your health care provider how to quit. If you do not use tobacco, do not start.  Lung cancer screening is recommended for adults aged 37-80 years who are at high risk for developing lung cancer because of a history of smoking. A yearly low-dose CT scan of the lungs is recommended for people who have at least a 30-pack-year history of smoking and are a current smoker or have quit within the past 15 years. A pack year of smoking is smoking an average of 1 pack of cigarettes a day for 1 year (for example: 1 pack a day for 30 years or 2 packs a day for 15 years). Yearly screening should continue until the smoker has stopped smoking for at least 15 years. Yearly screening should be stopped for people who develop a health problem that would prevent them from having lung cancer treatment.  High blood pressure causes heart disease and increases the risk of stroke. Your blood pressure should be checked at least every 1 to 2 years. Ongoing high blood pressure should be treated with medicines if weight loss and  exercise do not work.  If you are 38-36 years old, ask your health care provider if you should take aspirin to prevent strokes.  Diabetes screening involves taking a blood sample to check your fasting blood sugar level. This should be done once every 3 years, after age 17, if you are within normal weight and without risk factors for diabetes. Testing should be considered at a younger age or be carried out more frequently if you are overweight and have at least 1 risk factor for diabetes.  Breast cancer screening is essential preventive care for women. You should practice "breast self-awareness." This means understanding the normal appearance and feel of your breasts and may include breast self-examination. Any changes detected, no matter how small, should be reported to a health care provider. Women in their 81s and 30s should have a clinical breast exam (CBE) by a health care provider as part of a regular health exam every 1 to 3 years. After age 62, women should have a CBE every year. Starting at age 86, women should consider having a mammogram (breast X-ray test) every year. Women who have a family history of breast cancer should talk to their health care provider about genetic screening. Women at a high risk of breast cancer should talk to their health care providers about having an MRI and a mammogram every year.  Breast cancer gene (BRCA)-related cancer risk assessment is recommended for women who have family members with BRCA-related cancers. BRCA-related cancers include breast, ovarian, tubal, and peritoneal cancers. Having family members with these cancers may be associated with an increased risk for harmful changes (mutations) in the breast cancer genes BRCA1 and BRCA2. Results of the assessment will determine the need for genetic counseling and BRCA1 and BRCA2 testing.  Routine pelvic exams to screen for cancer are no longer recommended for nonpregnant women who are considered low risk for  cancer of the pelvic organs (ovaries, uterus, and vagina) and who do not have symptoms. Ask your health care provider if a screening pelvic exam is right for you.  If you have had past treatment for cervical cancer or a condition that could lead to cancer, you need Pap tests and screening for cancer for at least 20 years after your treatment. If Pap tests have been discontinued, your risk factors (such  as having a new sexual partner) need to be reassessed to determine if screening should be resumed. Some women have medical problems that increase the chance of getting cervical cancer. In these cases, your health care provider may recommend more frequent screening and Pap tests.  Colorectal cancer can be detected and often prevented. Most routine colorectal cancer screening begins at the age of 60 years and continues through age 35 years. However, your health care provider may recommend screening at an earlier age if you have risk factors for colon cancer. On a yearly basis, your health care provider may provide home test kits to check for hidden blood in the stool. Use of a small camera at the end of a tube, to directly examine the colon (sigmoidoscopy or colonoscopy), can detect the earliest forms of colorectal cancer. Talk to your health care provider about this at age 24, when routine screening begins.  Direct exam of the colon should be repeated every 5-10 years through age 75 years, unless early forms of pre-cancerous polyps or small growths are found.  Hepatitis C blood testing is recommended for all people born from [birth year] through 1965 and any individual with known risks for hepatitis C.  Pra  Osteoporosis is a disease in which the bones lose minerals and strength with aging. This can result in serious bone fractures or breaks. The risk of osteoporosis can be identified using a bone density scan. Women ages 68 years and over and women at risk for fractures or osteoporosis should discuss screening with  their health care providers. Ask your health care provider whether you should take a calcium supplement or vitamin D to reduce the rate of osteoporosis.  Menopause can be associated with physical symptoms and risks. Hormone replacement therapy is available to decrease symptoms and risks. You should talk to your health care provider about whether hormone replacement therapy is right for you.  Use sunscreen. Apply sunscreen liberally and repeatedly throughout the day. You should seek shade when your shadow is shorter than you. Protect yourself by wearing long sleeves, pants, a wide-brimmed hat, and sunglasses year round, whenever you are outdoors.  Once a month, do a whole body skin exam, using a mirror to look at the skin on your back. Tell your health care provider of new moles, moles that have irregular borders, moles that are larger than a pencil eraser, or moles that have changed in shape or color.  Stay current with required vaccines (immunizations).  Influenza vaccine. All adults should be immunized every year.  Tetanus, diphtheria, and acellular pertussis (Td, Tdap) vaccine. Pregnant women should receive 1 dose of Tdap vaccine during each pregnancy. The dose should be obtained regardless of the length of time since the last dose. Immunization is preferred during the 27th-36th week of gestation. An adult who has not previously received Tdap or who does not know her vaccine status should receive 1 dose of Tdap. This initial dose should be followed by tetanus and diphtheria toxoids (Td) booster doses every 10 years. Adults with an unknown or incomplete history of completing a 3-dose immunization series with Td-containing vaccines should begin or complete a primary immunization series including a Tdap dose. Adults should receive a Td booster every 10 years.  Varicella vaccine. An adult without evidence of immunity to varicella should receive 2 doses or a second dose if she has previously received 1  dose. Pregnant females who do not have evidence of immunity should receive the first dose after pregnancy. This first dose  should be obtained before leaving the health care facility. The second dose should be obtained 4-8 weeks after the first dose.  Human papillomavirus (HPV) vaccine. Females aged 13-26 years who have not received the vaccine previously should obtain the 3-dose series. The vaccine is not recommended for use in pregnant females. However, pregnancy testing is not needed before receiving a dose. If a female is found to be pregnant after receiving a dose, no treatment is needed. In that case, the remaining doses should be delayed until after the pregnancy. Immunization is recommended for any person with an immunocompromised condition through the age of 32 years if she did not get any or all doses earlier. During the 3-dose series, the second dose should be obtained 4-8 weeks after the first dose. The third dose should be obtained 24 weeks after the first dose and 16 weeks after the second dose.  Zoster vaccine. One dose is recommended for adults aged 4 years or older unless certain conditions are present.  Measles, mumps, and rubella (MMR) vaccine. Adults born before [birth year] generally are considered immune to measles and mumps. Adults born in [birth year] or later should have 1 or more doses of MMR vaccine unless there is a contraindication to the vaccine or there is laboratory evidence of immunity to each of the three diseases. A routine second dose of MMR vaccine should be obtained at least 28 days after the first dose for students attending postsecondary schools, health care workers, or international travelers. People who received inactivated measles vaccine or an unknown type of measles vaccine during 1963-1967 should receive 2 doses of MMR vaccine. People who received inactivated mumps vaccine or an unknown type of mumps vaccine before 1979 and are at high risk for mumps infection should consider  immunization with 2 doses of MMR vaccine. For females of childbearing age, rubella immunity should be determined. If there is no evidence of immunity, females who are not pregnant should be vaccinated. If there is no evidence of immunity, females who are pregnant should delay immunization until after pregnancy. Unvaccinated health care workers born before [birth year] who lack laboratory evidence of measles, mumps, or rubella immunity or laboratory confirmation of disease should consider measles and mumps immunization with 2 doses of MMR vaccine or rubella immunization with 1 dose of MMR vaccine.  Pneumococcal 13-valent conjugate (PCV13) vaccine. When indicated, a person who is uncertain of her immunization history and has no record of immunization should receive the PCV13 vaccine. An adult aged 77 years or older who has certain medical conditions and has not been previously immunized should receive 1 dose of PCV13 vaccine. This PCV13 should be followed with a dose of pneumococcal polysaccharide (PPSV23) vaccine. The PPSV23 vaccine dose should be obtained at least 1 or more year(s) after the dose of PCV13 vaccine. An adult aged 29 years or older who has certain medical conditions and previously received 1 or more doses of PPSV23 vaccine should receive 1 dose of PCV13. The PCV13 vaccine dose should be obtained 1 or more years after the last PPSV23 vaccine dose.    Pneumococcal polysaccharide (PPSV23) vaccine. When PCV13 is also indicated, PCV13 should be obtained first. All adults aged 65 years and older should be immunized. An adult younger than age 35 years who has certain medical conditions should be immunized. Any person who resides in a nursing home or long-term care facility should be immunized. An adult smoker should be immunized. People with an immunocompromised condition and certain other conditions should receive both  PCV13 and PPSV23 vaccines. People with human immunodeficiency virus (HIV) infection should  be immunized as soon as possible after diagnosis. Immunization during chemotherapy or radiation therapy should be avoided. Routine use of PPSV23 vaccine is not recommended for American Indians, [place] Natives, or people younger than 65 years unless there are medical conditions that require PPSV23 vaccine. When indicated, people who have unknown immunization and have no record of immunization should receive PPSV23 vaccine. One-time revaccination 5 years after the first dose of PPSV23 is recommended for people aged 19-64 years who have chronic kidney failure, nephrotic syndrome, asplenia, or immunocompromised conditions. People who received 1-2 doses of PPSV23 before age 19 years should receive another dose of PPSV23 vaccine at age 58 years or later if at least 5 years have passed since the previous dose. Doses of PPSV23 are not needed for people immunized with PPSV23 at or after age 106 years.  Preventive Services / Frequency   Ages 28 to 7 years  Blood pressure check.  Lipid and cholesterol check.  Lung cancer screening. / Every year if you are aged 67-80 years and have a 30-pack-year history of smoking and currently smoke or have quit within the past 15 years. Yearly screening is stopped once you have quit smoking for at least 15 years or develop a health problem that would prevent you from having lung cancer treatment.  Clinical breast exam.** / Every year after age 76 years.   BRCA-related cancer risk assessment.** / For women who have family members with a BRCA-related cancer (breast, ovarian, tubal, or peritoneal cancers).  Mammogram.** / Every year beginning at age 64 years and continuing for as long as you are in good health. Consult with your health care provider.  Pap test.** / Every 3 years starting at age 85 years through age 23 or 29 years with a history of 3 consecutive normal Pap tests.  HPV screening.** / Every 3 years from ages 28 years through ages 18 to 42 years with a history  of 3 consecutive normal Pap tests.  Fecal occult blood test (FOBT) of stool. / Every year beginning at age 44 years and continuing until age 44 years. You may not need to do this test if you get a colonoscopy every 10 years.  Flexible sigmoidoscopy or colonoscopy.** / Every 5 years for a flexible sigmoidoscopy or every 10 years for a colonoscopy beginning at age 79 years and continuing until age 76 years.  Hepatitis C blood test.** / For all people born from [birth year] through 1964 and any individual with known risks for hepatitis C.  Skin self-exam. / Monthly.  Influenza vaccine. / Every year.  Tetanus, diphtheria, and acellular pertussis (Tdap/Td) vaccine.** / Consult your health care provider. Pregnant women should receive 1 dose of Tdap vaccine during each pregnancy. 1 dose of Td every 10 years.  Varicella vaccine.** / Consult your health care provider. Pregnant females who do not have evidence of immunity should receive the first dose after pregnancy.  Zoster vaccine.** / 1 dose for adults aged 32 years or older.  Pneumococcal 13-valent conjugate (PCV13) vaccine.** / Consult your health care provider.  Pneumococcal polysaccharide (PPSV23) vaccine.** / 1 to 2 doses if you smoke cigarettes or if you have certain conditions.  Meningococcal vaccine.** / Consult your health care provider.  Hepatitis A vaccine.** / Consult your health care provider.  Hepatitis B vaccine.** / Consult your health care provider. Screening for abdominal aortic aneurysm (AAA)  by ultrasound is recommended for people over 50  who have history of high blood pressure or who are current or former smokers. ++++++++++++++++++ Recommend Adult Low Dose Aspirin or  coated  Aspirin 81 mg daily  To reduce risk of Colon Cancer 40 %,  Skin Cancer 26 % ,  Melanoma 46%  and  Pancreatic cancer 60% +++++++++++++++++++ Vitamin D goal  is between 70-100.  Please make sure that you are taking your Vitamin D as directed.  It  is very important as a natural anti-inflammatory  helping hair, skin, and nails, as well as reducing stroke and heart attack risk.  It helps your bones and helps with mood. It also decreases numerous cancer risks so please take it as directed.  Low Vit D is associated with a 200-300% higher risk for CANCER  and 200-300% higher risk for HEART   ATTACK  &  STROKE.   .....................................Marland Kitchen It is also associated with higher death rate at younger ages,  autoimmune diseases like Rheumatoid arthritis, Lupus, Multiple Sclerosis.    Also many other serious conditions, like depression, Alzheimer's Dementia, infertility, muscle aches, fatigue, fibromyalgia - just to name a few. ++++++++++++++++++ Recommend the book "The END of DIETING" by Dr Excell Seltzer  & the book "The END of DIABETES " by Dr Excell Seltzer At [email or web address] - get book & Audio CD's    Being diabetic has a  300% increased risk for heart attack, stroke, cancer, and alzheimer- type vascular dementia. It is very important that you work harder with diet by avoiding all foods that are white. Avoid white rice (brown & wild rice is OK), white potatoes (sweetpotatoes in moderation is OK), White bread or wheat bread or anything made out of white flour like bagels, donuts, rolls, buns, biscuits, cakes, pastries, cookies, pizza crust, and pasta (made from white flour & egg whites) - vegetarian pasta or spinach or wheat pasta is OK. Multigrain breads like Arnold's or [employer], or multigrain sandwich thins or flatbreads.  Diet, exercise and weight loss can reverse and cure diabetes in the early stages.  Diet, exercise and weight loss is very important in the control and prevention of complications of diabetes which affects every system in your body, ie. Brain - dementia/stroke, eyes - glaucoma/blindness, heart - heart attack/heart failure, kidneys - dialysis, stomach - gastric paralysis, intestines - malabsorption, nerves - severe painful  neuritis, circulation - gangrene & loss of a leg(s), and finally cancer and Alzheimers.    I recommend avoid fried & greasy foods,  sweets/candy, white rice (brown or wild rice or Quinoa is OK), white potatoes (sweet potatoes are OK) - anything made from white flour - bagels, doughnuts, rolls, buns, biscuits,white and wheat breads, pizza crust and traditional pasta made of white flour & egg white(vegetarian pasta or spinach or wheat pasta is OK).  Multi-grain bread is OK - like multi-grain flat bread or sandwich thins. Avoid alcohol in excess. Exercise is also important.    Eat all the vegetables you want - avoid meat, especially red meat and dairy - especially cheese.  Cheese is the most concentrated form of trans-fats which is the worst thing to clog up our arteries. Veggie cheese is OK which can be found in the fresh produce section at [employer] or [employer] or [employer]  ++++++++++++++++++++++ DASH Eating Plan  DASH stands for "Dietary Approaches to Stop Hypertension."   The DASH eating plan is a healthy eating plan that has been shown to reduce high blood pressure (hypertension). Additional health benefits may include reducing the  risk of type 2 diabetes mellitus, heart disease, and stroke. The DASH eating plan may also help with weight loss. WHAT DO I NEED TO KNOW ABOUT THE DASH EATING PLAN? For the DASH eating plan, you will follow these general guidelines:  Choose foods with a percent daily value for sodium of less than 5% (as listed on the food label).  Use salt-free seasonings or herbs instead of table salt or sea salt.  Check with your health care provider or pharmacist before using salt substitutes.  Eat lower-sodium products, often labeled as "lower sodium" or "no salt added."  Eat fresh foods.  Eat more vegetables, fruits, and low-fat dairy products.  Choose whole grains. Look for the word "whole" as the first word in the ingredient list.  Choose fish   Limit  sweets, desserts, sugars, and sugary drinks.  Choose heart-healthy fats.  Eat veggie cheese   Eat more home-cooked food and less restaurant, buffet, and fast food.  Limit fried foods.  Cook foods using methods other than frying.  Limit canned vegetables. If you do use them, rinse them well to decrease the sodium.  When eating at a restaurant, ask that your food be prepared with less salt, or no salt if possible.                      WHAT FOODS CAN I EAT? Read Dr Fara Olden Fuhrman's books on The End of Dieting & The End of Diabetes  Grains Whole grain or whole wheat bread. Brown rice. Whole grain or whole wheat pasta. Quinoa, bulgur, and whole grain cereals. Low-sodium cereals. Corn or whole wheat flour tortillas. Whole grain cornbread. Whole grain crackers. Low-sodium crackers.  Vegetables Fresh or frozen vegetables (raw, steamed, roasted, or grilled). Low-sodium or reduced-sodium tomato and vegetable juices. Low-sodium or reduced-sodium tomato sauce and paste. Low-sodium or reduced-sodium canned vegetables.   Fruits All fresh, canned (in natural juice), or frozen fruits.  Protein Products  All fish and seafood.  Dried beans, peas, or lentils. Unsalted nuts and seeds. Unsalted canned beans.  Dairy Low-fat dairy products, such as skim or 1% milk, 2% or reduced-fat cheeses, low-fat ricotta or cottage cheese, or plain low-fat yogurt. Low-sodium or reduced-sodium cheeses.  Fats and Oils Tub margarines without trans fats. Light or reduced-fat mayonnaise and salad dressings (reduced sodium). Avocado. Safflower, olive, or canola oils. Natural peanut or almond butter.  Other Unsalted popcorn and pretzels. The items listed above may not be a complete list of recommended foods or beverages. Contact your dietitian for more options.  ++++++++++++++++++  WHAT FOODS ARE NOT RECOMMENDED? Grains/ White flour or wheat flour White bread. White pasta. White rice. Refined cornbread. Bagels and  croissants. Crackers that contain trans fat.  Vegetables  Creamed or fried vegetables. Vegetables in a . Regular canned vegetables. Regular canned tomato sauce and paste. Regular tomato and vegetable juices.  Fruits Dried fruits. Canned fruit in light or heavy syrup. Fruit juice.  Meat and Other Protein Products Meat in general - RED meat & White meat.  Fatty cuts of meat. Ribs, chicken wings, all processed meats as bacon, sausage, bologna, salami, fatback, hot dogs, bratwurst and packaged luncheon meats.  Dairy Whole or 2% milk, cream, half-and-half, and cream cheese. Whole-fat or sweetened yogurt. Full-fat cheeses or blue cheese. Non-dairy creamers and whipped toppings. Processed cheese, cheese spreads, or cheese curds.  Condiments Onion and garlic salt, seasoned salt, table salt, and sea salt. Canned and packaged gravies. Worcestershire sauce. Tartar sauce.  Barbecue sauce. Teriyaki sauce. Soy sauce, including reduced sodium. Steak sauce. Fish sauce. Oyster sauce. Cocktail sauce. Horseradish. Ketchup and mustard. Meat flavorings and tenderizers. Bouillon cubes. Hot sauce. Tabasco sauce. Marinades. Taco seasonings. Relishes.  Fats and Oils Butter, stick margarine, lard, shortening and bacon fat. Coconut, palm kernel, or palm oils. Regular salad dressings.  Pickles and olives. Salted popcorn and pretzels.  The items listed above may not be a complete list of foods and beverages to avoid.

## 2020-08-31 NOTE — Progress Notes (Signed)
Annual Screening/Preventative Visit & Comprehensive Evaluation &  Examination      This very nice 65 y.o. MWF presents for a Screening /Preventative Visit & comprehensive evaluation and management of multiple medical co-morbidities.  Patient has been followed for HTN, HLD, Prediabetes  and Vitamin D Deficiency.  Patient is on CPAP for  OSA  (2004) with improved  restorative sleep. Patient is also followed by Dr Cy Blamer for severe DJD and rheumatoid Arthritis - now predominantly affecting her hands apparently significantly improved since starting plaquenil.       Since 1997, patient has been on SS Disability (age 64)  for Depression & Panic /Anxiety Attacks.          HTN predates circa 2004. Patient's BP has been controlled at home and patient denies any cardiac symptoms as chest pain, palpitations, shortness of breath, dizziness or ankle swelling. Today's BP is at goal - 118/84       Patient's hyperlipidemia is controlled with diet and medications. Patient denies myalgias or other medication SE's. Last lipids were at goal:  Lab Results  Component Value Date   CHOL 137 03/04/2020   HDL 55 03/04/2020   LDLCALC 59 03/04/2020   TRIG 157 (H) 03/04/2020   CHOLHDL 2.5 03/04/2020        Patient is moderately overweight (BMI 33) with  prediabetes (A1c 5.7% /2010)and patient denies reactive hypoglycemic symptoms, visual blurring, diabetic polys or paresthesias. Last A1c was near goal:  Lab Results  Component Value Date   HGBA1C 5.7 (H) 03/04/2020        Finally, patient has history of Vitamin D Deficiency ("23" /2008) and last Vitamin D was at goal:  Lab Results  Component Value Date   VD25OH 69 03/04/2020    Current Outpatient Medications on File Prior to Visit  Medication Sig  . acyclovir) 400 MG tablet Take 1 tablet  3  times daily as needed   . acyclovir ointment 5 % Apply thin layer to affected area  . aspirin EC 81 MG tablet Take  daily.  . cetirizine  10 MG  tablet Take daily.   Marland Kitchen VITAMIN D 2000 units Take 8,000 Units  daily  . clonazePAM  1 MG tablet Take 1 mg at bedtime as needed   . hctz 25 MG tablet TAKE 1 TABLET  DAILY    . hydroxychloroquine  200 MG tablet Take 1 tab 2 x /day  5 days /week / Mon ->Thurs  . lamoTRIgine (LAMICTAL) 150 MG tablet Take 1 at bedtime   . Lysine 500 MG TABS Take  2 pill QD  . Magnesium 500 MG TABS Takes 1000mg  TID  . Multiple Vitamin w/Monersals  Take 0.5 tablets  2 times daily.  . QUEtiapine  100 MG tablet Take 1 tablet  3  times daily.  . rosuvastatin  40 MG tablet Take 1 tablet Daily for Cholesterol      Allergies  Allergen Reactions  . Latex     Band aids= if left on for extended period of time  . Celecoxib     REACTION: Rash (celebrex)  . Cephalexin     REACTION: Rash (keflex)  . Codeine     REACTION: Rash  . Erythromycin     REACTION: Rash (emycin)  . Sulfonamide Derivatives     REACTION: Rash  . Trazodone And Nefazodone     insomnia  . Venlafaxine     REACTION: Blurred Vision Insurance claims handler)    Past Medical History:  Diagnosis Date  . Arthritis   . Depression   . Elevated hemoglobin A1c   . Fibromyalgia   . Hypertension   . Other abnormal glucose 08/09/2013  . S/P total knee arthroplasty, left 01/07/2019  . Simple renal cyst 12/12/2019   Incidentally noted on abd [place] 11/2019 - radiologist recommended further characterization with MRI w & w/o contrast   Hypoechoic lesions within the kidney which may simply represent mildly complicated cysts. The largest of these lies in the midportion of the kidney with increased echogenicity within and suggestion of internal color flow which may be related to septation. MRI of the kidneys with and w  . Sleep apnea    cpap   > 4 yrs  last sleep study  . Status post total shoulder arthroplasty, left 01/07/2019  . Unspecified essential hypertension 08/09/2013  . Unspecified vitamin D deficiency 08/09/2013  . Vitamin D deficiency     Health Maintenance  Topic  Date Due  . INFLUENZA VACCINE  01/21/2020  . COVID-19 Vaccine (3 - Booster for Pfizer series) 05/30/2020  . MAMMOGRAM  07/15/2022  . TETANUS/TDAP  07/04/2025  . COLONOSCOPY (Pts 45-41yrs Insurance coverage will need to be confirmed)  12/02/2027  . Hepatitis C Screening  Completed  . HIV Screening  Completed  . HPV VACCINES  Aged Out  . PAP SMEAR-Modifier  Discontinued    Immunization History  Administered Date(s) Administered  . DT (Pediatric) 07/05/2015  . Influenza Inj Mdck Quad With Preservative 05/28/2017, 04/13/2018  . Influenza Split 04/06/2012, 04/05/2013, 05/16/2014, 03/21/2015  . Influenza,inj,Quad PF,6+ Mos 03/16/2019  . Influenza,inj,quad, With Preservative 04/29/2016  . PFIZER(Purple Top)SARS-COV-2 Vaccination 11/08/2019, 11/29/2019  . Pneumococcal Conjugate-13 05/16/2014  . Pneumococcal-Unspecified 06/22/1998  . Td 06/22/2004  . Zoster 10/21/2015    Last Colon -  06.12.2019 - Dr Ardis Hughs recc 10 yr f/u due June 2029  Last MGM -  07/15/2020  Past Surgical History:  Procedure Laterality Date  . ABDOMINAL HYSTERECTOMY  2002   Dr. Brien Mates   . COLONOSCOPY  2009  . JOINT REPLACEMENT     arthroscopy  . KNEE ARTHROPLASTY    . TOTAL KNEE ARTHROPLASTY Right 04/14/2013   Procedure: RIGHT TOTAL KNEE ARTHROPLASTY AND LEFT KNEE INJECTION ;  Surgeon: Alta Corning, MD;  Location: [facility];  Service: Orthopedics;  Laterality: Right;  . TOTAL KNEE ARTHROPLASTY Left 01/06/2019   Procedure: TOTAL KNEE ARTHROPLASTY;  Surgeon: Dorna Leitz, MD;  Location: [facility];  Service: Orthopedics;  Laterality: Left;    Family History  Problem Relation Age of Onset  . Hypertension Mother   . Heart attack Mother   . Hypertension Father   . COPD Father   . Cancer Father        thyroid  . CVA Maternal Grandmother 32  . Heart attack Maternal Grandfather 59  . Deep vein thrombosis Maternal Grandfather   . Colon cancer Neg Hx   . Esophageal cancer Neg Hx   . Stomach cancer Neg Hx     Social  History   Tobacco Use  . Smoking status: Never Smoker  . Smokeless tobacco: Never Used  Vaping Use  . Vaping Use: Never used  Substance Use Topics  . Alcohol use: No  . Drug use: No    ROS Constitutional: Denies fever, chills, weight loss/gain, headaches, insomnia,  night sweats, and change in appetite. Does c/o fatigue. Eyes: Denies redness, blurred vision, diplopia, discharge, itchy, watery eyes.  ENT: Denies discharge, congestion, post nasal drip, epistaxis, sore  throat, earache, hearing loss, dental pain, Tinnitus, Vertigo, Sinus pain, snoring.  Cardio: Denies chest pain, palpitations, irregular heartbeat, syncope, dyspnea, diaphoresis, orthopnea, PND, claudication, edema Respiratory: denies cough, dyspnea, DOE, pleurisy, hoarseness, laryngitis, wheezing.  Gastrointestinal: Denies dysphagia, heartburn, reflux, water brash, pain, cramps, nausea, vomiting, bloating, diarrhea, constipation, hematemesis, melena, hematochezia, jaundice, hemorrhoids Genitourinary: Denies dysuria, frequency, urgency, nocturia, hesitancy, discharge, hematuria, flank pain Breast: Breast lumps, nipple discharge, bleeding.  Musculoskeletal: Denies arthralgia, myalgia, stiffness, Jt. Swelling, pain, limp, and strain/sprain. Denies falls. Skin: Denies puritis, rash, hives, warts, acne, eczema, changing in skin lesion Neuro: No weakness, tremor, incoordination, spasms, paresthesia, pain Psychiatric: Denies confusion, memory loss, sensory loss. Denies Depression. Endocrine: Denies change in weight, skin, hair change, nocturia, and paresthesia, diabetic polys, visual blurring, hyper / hypo glycemic episodes.  Heme/Lymph: No excessive bleeding, bruising, enlarged lymph nodes.  Physical Exam  BP 118/84   Pulse 93   Temp (!) 96.6 F (35.9 C)   Resp 16   Ht 5\' 4"  (1.626 m)   Wt 192 lb (87.1 kg)   SpO2 99%   BMI 32.96 kg/m   General Appearance: Well nourished, well groomed and in no apparent distress.  Eyes:  PERRLA, EOMs, conjunctiva no swelling or erythema, normal fundi and vessels. Sinuses: No frontal/maxillary tenderness ENT/Mouth: EACs patent / TMs  nl. Nares clear without erythema, swelling, mucoid exudates. Oral hygiene is good. No erythema, swelling, or exudate. Tongue normal, non-obstructing. Tonsils not swollen or erythematous. Hearing normal.  Neck: Supple, thyroid not palpable. No bruits, nodes or JVD. Respiratory: Respiratory effort normal.  BS equal and clear bilateral without rales, rhonci, wheezing or stridor. Cardio: Heart sounds are normal with regular rate and rhythm and no murmurs, rubs or gallops. Peripheral pulses are normal and equal bilaterally without edema. No aortic or femoral bruits. Chest: symmetric with normal excursions and percussion. Breasts: Symmetric, without lumps, nipple discharge, retractions, or fibrocystic changes.  Abdomen: Flat, soft with bowel sounds active. Nontender, no guarding, rebound, hernias, masses, or organomegaly.  Lymphatics: Non tender without lymphadenopathy.  Genitourinary:  Musculoskeletal: Full ROM all peripheral extremities, joint stability, 5/5 strength, and normal gait. Skin: Warm and dry without rashes, lesions, cyanosis, clubbing or  ecchymosis.  Neuro: Cranial nerves intact, reflexes equal bilaterally. Normal muscle tone, no cerebellar symptoms. Sensation intact.  Pysch: Alert and oriented X 3, normal affect, Insight and Judgment appropriate.   Assessment and Plan  1. Annual Preventative Screening Examination   2. Essential hypertension  - EKG 12-Lead - COMPLETE METABOLIC PANEL WITH GFR - CBC with Differential/Platelet - Magnesium - TSH  3. Hyperlipemia, mixed  - EKG 12-Lead - Lipid panel - TSH  4. Abnormal glucose  - EKG 12-Lead - Hemoglobin A1c - Insulin, random  5. Vitamin D deficiency  - VITAMIN D 25 Hydroxy  6. Depression, major, recurrent ([place])  - TSH  7. OSA on CPAP   8. Morbid obesity ([place]) - BMI  30+ with OSA  - TSH  9. Prediabetes  - EKG 12-Lead - COMPLETE METABOLIC PANEL WITH GFR - CBC with Differential/Platelet - Magnesium - Lipid panel - TSH - Hemoglobin A1c - Insulin, random - VITAMIN D 25 Hydroxy  10. Screening for colorectal cancer  - POC Hemoccult Bld/Stl   11. Screening for ischemic heart disease  - EKG 12-Lead  12. FH: hypertension  - EKG 12-Lead  13. Medication management  - Urinalysis, Routine w reflex microscopic - Microalbumin / creatinine urine ratio  Patient was counseled in prudent diet to achieve/maintain BMI less than 25 for weight control, BP monitoring, regular exercise and medications. Discussed med's effects and SE's. Screening labs and tests as requested with regular follow-up as recommended. Over 40 minutes of exam, counseling, chart review and high complex critical decision making was performed.   Kirtland Bouchard, MD

## 2020-09-02 ENCOUNTER — Ambulatory Visit: Payer: Medicare Other | Admitting: Internal Medicine

## 2020-09-02 ENCOUNTER — Other Ambulatory Visit: Payer: Self-pay

## 2020-09-02 VITALS — BP 118/84 | HR 93 | Temp 96.6°F | Resp 16 | Ht 64.0 in | Wt 192.0 lb

## 2020-09-02 DIAGNOSIS — Z1211 Encounter for screening for malignant neoplasm of colon: Secondary | ICD-10-CM

## 2020-09-02 DIAGNOSIS — Z79899 Other long term (current) drug therapy: Secondary | ICD-10-CM | POA: Diagnosis not present

## 2020-09-02 DIAGNOSIS — Z8249 Family history of ischemic heart disease and other diseases of the circulatory system: Secondary | ICD-10-CM

## 2020-09-02 DIAGNOSIS — R7309 Other abnormal glucose: Secondary | ICD-10-CM | POA: Diagnosis not present

## 2020-09-02 DIAGNOSIS — G4733 Obstructive sleep apnea (adult) (pediatric): Secondary | ICD-10-CM

## 2020-09-02 DIAGNOSIS — Z136 Encounter for screening for cardiovascular disorders: Secondary | ICD-10-CM | POA: Diagnosis not present

## 2020-09-02 DIAGNOSIS — Z0001 Encounter for general adult medical examination with abnormal findings: Secondary | ICD-10-CM

## 2020-09-02 DIAGNOSIS — E559 Vitamin D deficiency, unspecified: Secondary | ICD-10-CM | POA: Diagnosis not present

## 2020-09-02 DIAGNOSIS — I10 Essential (primary) hypertension: Secondary | ICD-10-CM | POA: Diagnosis not present

## 2020-09-02 DIAGNOSIS — Z0000 Encounter for general adult medical examination without abnormal findings: Secondary | ICD-10-CM

## 2020-09-02 DIAGNOSIS — E782 Mixed hyperlipidemia: Secondary | ICD-10-CM

## 2020-09-02 DIAGNOSIS — F3341 Major depressive disorder, recurrent, in partial remission: Secondary | ICD-10-CM | POA: Diagnosis not present

## 2020-09-02 DIAGNOSIS — R7303 Prediabetes: Secondary | ICD-10-CM

## 2020-09-02 NOTE — Progress Notes (Signed)
Office Visit Note  Patient: [name]             Date of Birth: 09/03/1955           MRN: [id number]             PCP: Unk Pinto, MD Referring: Unk Pinto, MD Visit Date: 09/16/2020 Occupation: @GUAROCC @  Subjective:  Medication management.   History of Present Illness: [name] is a 65 y.o. female with a history of rheumatoid arthritis and osteoarthritis.  She states she has been doing well on hydroxychloroquine.  She denies any joint pain or joint swelling.  She some stiffness in her joints in the morning.  She has been tolerating hydroxychloroquine well.  Activities of Daily Living:  Patient reports morning stiffness for 30-60 minutes.   Patient Denies nocturnal pain.  Difficulty dressing/grooming: Denies Difficulty climbing stairs: Denies Difficulty getting out of chair: Denies Difficulty using hands for taps, buttons, cutlery, and/or writing: Reports  Review of Systems  Constitutional: Negative for fatigue, night sweats, weight gain and weight loss.  HENT: Negative for mouth sores, trouble swallowing, trouble swallowing, mouth dryness and nose dryness.   Eyes: Negative for pain, redness, itching, visual disturbance and dryness.  Respiratory: Negative for cough, shortness of breath and difficulty breathing.   Cardiovascular: Negative for chest pain, palpitations, hypertension, irregular heartbeat and swelling in legs/feet.  Gastrointestinal: Negative for blood in stool, constipation and diarrhea.  Endocrine: Negative for increased urination.  Genitourinary: Negative for difficulty urinating and vaginal dryness.  Musculoskeletal: Positive for arthralgias, joint pain and morning stiffness. Negative for joint swelling, myalgias, muscle weakness, muscle tenderness and myalgias.  Skin: Negative for color change, rash, hair loss, redness, skin tightness, ulcers and sensitivity to sunlight.  Allergic/Immunologic: Negative for susceptible to infections.   Neurological: Positive for headaches. Negative for dizziness, numbness, memory loss, night sweats and weakness.  Hematological: Negative for bruising/bleeding tendency and swollen glands.  Psychiatric/Behavioral: Negative for depressed mood, confusion and sleep disturbance. The patient is not nervous/anxious.     PMFS History:  Patient Active Problem List   Diagnosis Date Noted  . Fatty liver 12/12/2019  . History of prediabetes 05/23/2019  . Osteoarthritis of both knees 03/20/2015  . OSA on CPAP 08/15/2014  . Fibromyalgia 01/31/2014  . Depression, major, recurrent, in partial remission ([place]) 01/31/2014  . Morbid obesity ([place]) - BMI 30+ with OSA 01/31/2014  . Essential hypertension 08/09/2013  . Vitamin D deficiency 08/09/2013    Past Medical History:  Diagnosis Date  . Fibromyalgia   . Hypertension   . S/P total knee arthroplasty, left 01/07/2019  . Simple renal cyst 12/12/2019   Incidentally noted on abd [place] 11/2019 - radiologist recommended further characterization with MRI w & w/o contrast   Hypoechoic lesions within the kidney which may simply represent mildly complicated cysts. The largest of these lies in the midportion of the kidney with increased echogenicity within and suggestion of internal color flow which may be related to septation. MRI of the kidneys with and w  . Status post total shoulder arthroplasty, left 01/07/2019  . Vitamin D deficiency     Family History  Problem Relation Age of Onset  . Hypertension Mother   . Heart attack Mother   . Hypertension Father   . COPD Father   . Cancer Father        thyroid  . CVA Maternal Grandmother 58  . Heart attack Maternal Grandfather 59  . Deep vein thrombosis Maternal Grandfather   .  Colon cancer Neg Hx   . Esophageal cancer Neg Hx   . Stomach cancer Neg Hx    Past Surgical History:  Procedure Laterality Date  . ABDOMINAL HYSTERECTOMY  2002   Dr. Brien Mates   . COLONOSCOPY  2009  . JOINT REPLACEMENT     arthroscopy   . KNEE ARTHROPLASTY    . TOTAL KNEE ARTHROPLASTY Right 04/14/2013   Procedure: RIGHT TOTAL KNEE ARTHROPLASTY AND LEFT KNEE INJECTION ;  Surgeon: Alta Corning, MD;  Location: [facility];  Service: Orthopedics;  Laterality: Right;  . TOTAL KNEE ARTHROPLASTY Left 01/06/2019   Procedure: TOTAL KNEE ARTHROPLASTY;  Surgeon: Dorna Leitz, MD;  Location: [facility];  Service: Orthopedics;  Laterality: Left;   Social History   Social History Narrative  . Not on file   Immunization History  Administered Date(s) Administered  . DT (Pediatric) 07/05/2015  . Influenza Inj Mdck Quad With Preservative 05/28/2017, 04/13/2018  . Influenza Split 04/06/2012, 04/05/2013, 05/16/2014, 03/21/2015  . Influenza,inj,Quad PF,6+ Mos 03/16/2019  . Influenza,inj,quad, With Preservative 04/29/2016  . PFIZER(Purple Top)SARS-COV-2 Vaccination 11/08/2019, 11/29/2019, 08/13/2020  . Pneumococcal Conjugate-13 05/16/2014  . Pneumococcal-Unspecified 06/22/1998  . Td 06/22/2004  . Zoster 10/21/2015     Objective: Vital Signs: BP (!) 154/81 (BP Location: Left Arm, Patient Position: Sitting, Cuff Size: Normal)   Pulse 84   Resp 15   Ht $R'5\' 4"'ZM$  (1.626 m)   Wt 194 lb (88 kg)   BMI 33.30 kg/m    Physical Exam Vitals and nursing note reviewed.  Constitutional:      Appearance: She is well-developed.  HENT:     Head: Normocephalic and atraumatic.  Eyes:     Conjunctiva/sclera: Conjunctivae normal.  Cardiovascular:     Rate and Rhythm: Normal rate and regular rhythm.     Heart sounds: Normal heart sounds.  Pulmonary:     Effort: Pulmonary effort is normal.     Breath sounds: Normal breath sounds.  Abdominal:     General: Bowel sounds are normal.     Palpations: Abdomen is soft.  Musculoskeletal:     Cervical back: Normal range of motion.  Lymphadenopathy:     Cervical: No cervical adenopathy.  Skin:    General: Skin is warm and dry.     Capillary Refill: Capillary refill takes less than 2 seconds.  Neurological:      Mental Status: She is alert and oriented to person, place, and time.  Psychiatric:        Behavior: Behavior normal.      Musculoskeletal Exam: C-spine was in good range of motion.  Shoulder joints, elbow joints, wrist joints, MCPs PIPs with good range of motion.  She has DIP thickening but no synovitis.  Hip joints and knee joints in good range of motion.  She had no tenderness over ankles, MTPs or PIPs.  Bilateral knee joints are replaced.  CDAI Exam: CDAI Score: - Patient Global: -; Provider Global: - Swollen: -; Tender: - Joint Exam 09/16/2020   No joint exam has been documented for this visit   There is currently no information documented on the homunculus. Go to the Rheumatology activity and complete the homunculus joint exam.  Investigation: No additional findings.  Imaging: No results found.  Recent Labs: Lab Results  Component Value Date   WBC 4.9 09/02/2020   HGB 14.3 09/02/2020   PLT 227 09/02/2020   NA 141 09/02/2020   K 4.1 09/02/2020   CL 101 09/02/2020   CO2 29 09/02/2020  GLUCOSE 99 09/02/2020   BUN 14 09/02/2020   CREATININE 0.72 09/02/2020   BILITOT 0.6 09/02/2020   ALKPHOS 89 01/03/2019   AST 22 09/02/2020   ALT 26 09/02/2020   PROT 7.6 09/02/2020   ALBUMIN 4.7 01/03/2019   CALCIUM 10.3 09/02/2020   GFRAA 103 09/02/2020    Speciality Comments: PLQ Eye Exam: 08/01/2020 WNL @ [facility] Follow up in 1 year.   Procedures:  No procedures performed Allergies: Latex, Celecoxib, Cephalexin, Codeine, Erythromycin, Sulfonamide derivatives, Trazodone and nefazodone, and Venlafaxine   Assessment / Plan:     Visit Diagnoses: Rheumatoid arthritis of multiple sites with negative rheumatoid factor ([place]) - RF-, +anti-CCP.  She is clinically doing well without any joint pain or joint swelling.  High risk medication use - plaquenil $RemoveBef'200mg'CaFASRhrOL$  po twice daily Monday to Friday. PLQ Eye Exam: 08/01/2020.  Labs obtained on September 02, 2020 were  normal.  Lab findings were discussed with the patient.  Positive anti-CCP test - Anti-CCP 39 (weak positive), RF negative, ESR 6.   Primary osteoarthritis of both hands-joint protection muscle strengthening was discussed.  Primary osteoarthritis of both feet-she is currently not having discomfort.  Status post total bilateral knee replacement-she had bilateral total knee replacement by Dr. Berenice Primas and has been doing well.  Fibromyalgia-she has some generalized hyperalgesia but not much discomfort.  Depression, major, recurrent, in partial remission ([place]) - she is on Lamictal.  Mixed hyperlipidemia-association of heart disease with rheumatoid arthritis was discussed.  Dietary modifications and exercises were emphasized.  Essential hypertension-her blood pressure is elevated today.  Have advised her to monitor blood pressure closely.  History of prediabetes  Vitamin D deficiency-her vitamin D level was 77 on September 02, 2020.  I have advised her to reduce vitamin D from 8000 units daily to 6000 units a day.  OSA on CPAP  Orders: No orders of the defined types were placed in this encounter.  No orders of the defined types were placed in this encounter.  .  Follow-Up Instructions: Return for Rheumatoid arthritis.   Bo Merino, MD  Note - This record has been created using Editor, commissioning.  Chart creation errors have been sought, but may not always  have been located. Such creation errors do not reflect on  the standard of medical care.

## 2020-09-03 LAB — HEMOGLOBIN A1C
Hgb A1c MFr Bld: 6.1 % of total Hgb — ABNORMAL HIGH (ref <5.7)
Mean Plasma Glucose: 128 mg/dL
eAG (mmol/L): 7.1 mmol/L

## 2020-09-03 LAB — COMPLETE METABOLIC PANEL WITH GFR
AG Ratio: 1.5 (calc) (ref 1.0–2.5)
ALT: 26 U/L (ref 6–29)
AST: 22 U/L (ref 10–35)
Albumin: 4.6 g/dL (ref 3.6–5.1)
Alkaline phosphatase (APISO): 83 U/L (ref 37–153)
BUN: 14 mg/dL (ref 7–25)
CO2: 29 mmol/L (ref 20–32)
Calcium: 10.3 mg/dL (ref 8.6–10.4)
Chloride: 101 mmol/L (ref 98–110)
Creat: 0.72 mg/dL (ref 0.50–0.99)
GFR, Est African American: 103 mL/min/{1.73_m2} (ref >=60)
GFR, Est Non African American: 89 mL/min/{1.73_m2} (ref >=60)
Globulin: 3 g/dL (calc) (ref 1.9–3.7)
Glucose, Bld: 99 mg/dL (ref 65–99)
Potassium: 4.1 mmol/L (ref 3.5–5.3)
Sodium: 141 mmol/L (ref 135–146)
Total Bilirubin: 0.6 mg/dL (ref 0.2–1.2)
Total Protein: 7.6 g/dL (ref 6.1–8.1)

## 2020-09-03 LAB — URINALYSIS, ROUTINE W REFLEX MICROSCOPIC
Bilirubin Urine: NEGATIVE
Glucose, UA: NEGATIVE
Hgb urine dipstick: NEGATIVE
Ketones, ur: NEGATIVE
Nitrite: NEGATIVE
Protein, ur: NEGATIVE
Specific Gravity, Urine: 1.017 (ref 1.001–1.03)
pH: >=8.5 — AB (ref 5.0–8.0)

## 2020-09-03 LAB — CBC WITH DIFFERENTIAL/PLATELET
Absolute Monocytes: 431 cells/uL (ref 200–950)
Basophils Absolute: 20 cells/uL (ref 0–200)
Basophils Relative: 0.4 %
Eosinophils Absolute: 118 cells/uL (ref 15–500)
Eosinophils Relative: 2.4 %
HCT: 42.3 % (ref 35.0–45.0)
Hemoglobin: 14.3 g/dL (ref 11.7–15.5)
Lymphs Abs: 1691 cells/uL (ref 850–3900)
MCH: 30.5 pg (ref 27.0–33.0)
MCHC: 33.8 g/dL (ref 32.0–36.0)
MCV: 90.2 fL (ref 80.0–100.0)
MPV: 10.4 fL (ref 7.5–12.5)
Monocytes Relative: 8.8 %
Neutro Abs: 2641 cells/uL (ref 1500–7800)
Neutrophils Relative %: 53.9 %
Platelets: 227 10*3/uL (ref 140–400)
RBC: 4.69 10*6/uL (ref 3.80–5.10)
RDW: 13.3 % (ref 11.0–15.0)
Total Lymphocyte: 34.5 %
WBC: 4.9 10*3/uL (ref 3.8–10.8)

## 2020-09-03 LAB — LIPID PANEL
Cholesterol: 127 mg/dL (ref <200)
HDL: 53 mg/dL (ref >=50)
LDL Cholesterol (Calc): 52 mg/dL (calc)
Non-HDL Cholesterol (Calc): 74 mg/dL (calc) (ref <130)
Total CHOL/HDL Ratio: 2.4 (calc) (ref <5.0)
Triglycerides: 132 mg/dL (ref <150)

## 2020-09-03 LAB — MICROALBUMIN / CREATININE URINE RATIO
Creatinine, Urine: 88 mg/dL (ref 20–275)
Microalb Creat Ratio: 3 mcg/mg creat (ref <30)
Microalb, Ur: 0.3 mg/dL

## 2020-09-03 LAB — INSULIN, RANDOM: Insulin: 36.7 u[IU]/mL — ABNORMAL HIGH

## 2020-09-03 LAB — TSH: TSH: 1.25 mIU/L (ref 0.40–4.50)

## 2020-09-03 LAB — MICROSCOPIC MESSAGE

## 2020-09-03 LAB — VITAMIN D 25 HYDROXY (VIT D DEFICIENCY, FRACTURES): Vit D, 25-Hydroxy: 77 ng/mL (ref 30–100)

## 2020-09-03 LAB — MAGNESIUM: Magnesium: 2.1 mg/dL (ref 1.5–2.5)

## 2020-09-03 NOTE — Progress Notes (Signed)
============================================================ -   Test results slightly outside the reference range are not unusual. If there is anything important, I will review this with you,  otherwise it is considered normal test values.  If you have further questions,  please do not hesitate to contact me at the office or via My Chart.  ============================================================ ============================================================  -  Total Chol = 127   and LDL Chol = 52  -  Both  Excellent   - Very low risk for Heart Attack  / Stroke ============================================================ ============================================================  -  A1c = 6.1% - Blood sugar and A1c are STILL elevated in the borderline and  early or pre-diabetes range which has the same   300% increased risk for heart attack, stroke, cancer and   alzheimer- type vascular dementia as full blown diabetes.   But the good news is that diet, exercise with  weight loss can cure the early diabetes at this point.  -  It is very important that you work harder with diet by  avoiding all foods that are white except chicken,   fish & calliflower.  - Avoid white rice  (brown & wild rice is OK),   - Avoid white potatoes  (sweet potatoes in moderation is OK),   White bread or wheat bread or anything made out of   white flour like bagels, donuts, rolls, buns, biscuits, cakes,  - pastries, cookies, pizza crust, and pasta (made from  white flour & egg whites)   - vegetarian pasta or spinach or wheat pasta is OK.  - Multigrain breads like Arnold's, Pepperidge Farm or   multigrain sandwich thins or high fiber breads like   [place] bread or "Dave's Killer" breads that are  4 to 5 grams fiber per slice !  are best.    Diet, exercise and weight loss can reverse and cure  diabetes in the early stages.    ============================================================ ============================================================  -  Vitamin D = 77 - Excellent - Please keep dose same ============================================================ ============================================================  -  All Else - CBC - Kidneys - Electrolytes - Liver - Magnesium & Thyroid    - all  Normal / OK ============================================================ ============================================================  -  Keep up the [place] Work  !  ============================================================ ============================================================

## 2020-09-04 DIAGNOSIS — R7303 Prediabetes: Secondary | ICD-10-CM | POA: Diagnosis not present

## 2020-09-16 ENCOUNTER — Encounter: Payer: Self-pay | Admitting: Rheumatology

## 2020-09-16 ENCOUNTER — Other Ambulatory Visit: Payer: Self-pay

## 2020-09-16 ENCOUNTER — Ambulatory Visit: Payer: Medicare PPO | Admitting: Rheumatology

## 2020-09-16 VITALS — BP 154/81 | HR 84 | Resp 15 | Ht 64.0 in | Wt 194.0 lb

## 2020-09-16 DIAGNOSIS — E559 Vitamin D deficiency, unspecified: Secondary | ICD-10-CM

## 2020-09-16 DIAGNOSIS — M19041 Primary osteoarthritis, right hand: Secondary | ICD-10-CM | POA: Diagnosis not present

## 2020-09-16 DIAGNOSIS — M797 Fibromyalgia: Secondary | ICD-10-CM | POA: Diagnosis not present

## 2020-09-16 DIAGNOSIS — M19071 Primary osteoarthritis, right ankle and foot: Secondary | ICD-10-CM

## 2020-09-16 DIAGNOSIS — R768 Other specified abnormal immunological findings in serum: Secondary | ICD-10-CM | POA: Diagnosis not present

## 2020-09-16 DIAGNOSIS — Z96653 Presence of artificial knee joint, bilateral: Secondary | ICD-10-CM

## 2020-09-16 DIAGNOSIS — Z87898 Personal history of other specified conditions: Secondary | ICD-10-CM

## 2020-09-16 DIAGNOSIS — E782 Mixed hyperlipidemia: Secondary | ICD-10-CM

## 2020-09-16 DIAGNOSIS — F3341 Major depressive disorder, recurrent, in partial remission: Secondary | ICD-10-CM | POA: Diagnosis not present

## 2020-09-16 DIAGNOSIS — M19072 Primary osteoarthritis, left ankle and foot: Secondary | ICD-10-CM

## 2020-09-16 DIAGNOSIS — Z79899 Other long term (current) drug therapy: Secondary | ICD-10-CM | POA: Diagnosis not present

## 2020-09-16 DIAGNOSIS — M0609 Rheumatoid arthritis without rheumatoid factor, multiple sites: Secondary | ICD-10-CM

## 2020-09-16 DIAGNOSIS — G4733 Obstructive sleep apnea (adult) (pediatric): Secondary | ICD-10-CM

## 2020-09-16 DIAGNOSIS — M19042 Primary osteoarthritis, left hand: Secondary | ICD-10-CM

## 2020-09-16 DIAGNOSIS — I10 Essential (primary) hypertension: Secondary | ICD-10-CM

## 2020-09-16 DIAGNOSIS — Z9989 Dependence on other enabling machines and devices: Secondary | ICD-10-CM

## 2020-09-16 NOTE — Patient Instructions (Signed)
Standing Labs We placed an order today for your standing lab work.   Please have your standing labs drawn in August  If possible, please have your labs drawn 2 weeks prior to your appointment so that the provider can discuss your results at your appointment.  We have open lab daily Monday through Thursday from 1:30-4:30 PM and Friday from 1:30-4:00 PM at the office of Dr. Bo Merino, [facility].   Please be advised, all patients with office appointments requiring lab work will take precedents over walk-in lab work.  If possible, please come for your lab work on Monday and Friday afternoons, as you may experience shorter wait times. The office is located at [address], [place], [place], [place] [address] No appointment is necessary.   Labs are drawn by [facility]. Please bring your co-pay at the time of your lab draw.  You may receive a bill from [facility] for your lab work.  If you wish to have your labs drawn at another location, please call the office 24 hours in advance to send orders.  If you have any questions regarding directions or hours of operation,  please call [phone].   As a reminder, please drink plenty of water prior to coming for your lab work. Thanks!   Heart Disease Prevention   Your inflammatory disease increases your risk of heart disease which includes heart attack, stroke, atrial fibrillation (irregular heartbeats), high blood pressure, heart failure and atherosclerosis (plaque in the arteries).  It is important to reduce your risk by:   . Keep blood pressure, cholesterol, and blood sugar at healthy levels   . Smoking Cessation   . Maintain a healthy weight  o BMI 20-25   . Eat a healthy diet  o Plenty of fresh fruit, vegetables, and whole grains  o Limit saturated fats, foods high in sodium, and added sugars  o DASH and Mediterranean diet   . Increase physical activity  o Recommend moderate physically activity for 150 minutes per  week/ 30 minutes a day for five days a week These can be broken up into three separate ten-minute sessions during the day.   . Reduce Stress  . Meditation, slow breathing exercises, yoga, coloring books  . Dental visits twice a year

## 2020-09-17 ENCOUNTER — Other Ambulatory Visit: Payer: Self-pay

## 2020-09-17 DIAGNOSIS — Z1211 Encounter for screening for malignant neoplasm of colon: Secondary | ICD-10-CM

## 2020-09-17 LAB — POC HEMOCCULT BLD/STL (HOME/3-CARD/SCREEN)
Card #2 Fecal Occult Blod, POC: NEGATIVE
Card #3 Fecal Occult Blood, POC: NEGATIVE
Fecal Occult Blood, POC: NEGATIVE

## 2020-09-18 DIAGNOSIS — Z1211 Encounter for screening for malignant neoplasm of colon: Secondary | ICD-10-CM | POA: Diagnosis not present

## 2020-09-18 DIAGNOSIS — Z1212 Encounter for screening for malignant neoplasm of rectum: Secondary | ICD-10-CM | POA: Diagnosis not present

## 2020-09-23 DIAGNOSIS — G4733 Obstructive sleep apnea (adult) (pediatric): Secondary | ICD-10-CM | POA: Diagnosis not present

## 2020-10-01 ENCOUNTER — Other Ambulatory Visit: Payer: Self-pay

## 2020-10-01 MED ORDER — ROSUVASTATIN CALCIUM 40 MG PO TABS: ORAL_TABLET | ORAL | 3 refills | Status: DC

## 2020-11-05 ENCOUNTER — Other Ambulatory Visit: Payer: Self-pay | Admitting: Adult Health

## 2020-12-04 DIAGNOSIS — F3132 Bipolar disorder, current episode depressed, moderate: Secondary | ICD-10-CM | POA: Diagnosis not present

## 2020-12-05 ENCOUNTER — Other Ambulatory Visit: Payer: Self-pay | Admitting: Rheumatology

## 2020-12-05 DIAGNOSIS — M0609 Rheumatoid arthritis without rheumatoid factor, multiple sites: Secondary | ICD-10-CM

## 2020-12-06 ENCOUNTER — Telehealth: Payer: Self-pay

## 2020-12-06 NOTE — Telephone Encounter (Signed)
Last Visit: 09/16/2020  Next Visit: 02/17/2021  Labs: 09/02/2020 CBC/CMP WNL  Eye exam: 08/01/2020.     Current Dose per office note 09/16/2020: plaquenil 200mg  po twice daily Monday to Friday  FO:YDXAJOINOM arthritis of multiple sites with negative rheumatoid factor  Last Fill: 07/08/2020  Okay to refill Plaquenil?

## 2020-12-06 NOTE — Telephone Encounter (Signed)
Patient called stating she received her Plaquenil prescription today and the directions state take 1 tablet by mouth daily on Monday through Friday only.  Patient states she just had an appointment and Dr. Estanislado Pandy didn't tell her she was changing the dosage of her prescription.  Patient was transferred to [facility].

## 2020-12-06 NOTE — Telephone Encounter (Signed)
Spoke with patient and advised her the prescription for PLQ was sent to the pharmacy today. Patient advised the directions are to take 1 tablet by mouth twice daily Monday-Friday. Patient advised to contact the pharmacy regarding the incorrect directions given. Patient advised to contact the office if she has any further questions or concerns.

## 2021-01-16 DIAGNOSIS — G4733 Obstructive sleep apnea (adult) (pediatric): Secondary | ICD-10-CM | POA: Diagnosis not present

## 2021-02-05 NOTE — Progress Notes (Signed)
Office Visit Note  Patient: [name]             Date of Birth: [birth year]           MRN: [id number]             PCP: Unk Pinto, MD Referring: Unk Pinto, MD Visit Date: 02/19/2021 Occupation: @GUAROCC @  Subjective:  Pain in both hands   History of Present Illness: [name] is a 65 y.o. female with history of seronegative rheumatoid arthritis, osteoarthritis, and fibromyalgia.  Patient is taking Plaquenil 200 mg 1 tablet by mouth twice daily Monday through Friday.  She continues to tolerate Plaquenil without any side effects.  She denies missing any doses recently.  She states over the past couple of months has been experiencing increased pain in both hands.  She states at times it feels as though she has a burning sensation from her elbows to her hands.  She denies any obvious numbness in her hands.  She states that over the past 3 weeks she has been painting the walls at her church which has exacerbated her discomfort.  She states that her pain is typically worse at night.  She takes Tylenol as needed and occasionally Advil as needed for pain relief.  She denies any other joint pain or joint swelling at this time. She has been experiencing interrupted sleep at night but typically sleeps about 7 to 8 hours per night.  Overall her energy level has been stable.    Activities of Daily Living:  Patient reports morning stiffness for 30 minutes.   Patient Reports nocturnal pain.  Difficulty dressing/grooming: Denies Difficulty climbing stairs: Denies Difficulty getting out of chair: Reports Difficulty using hands for taps, buttons, cutlery, and/or writing: Reports  Review of Systems  Constitutional:  Negative for fatigue.  HENT:  Negative for mouth sores, mouth dryness and nose dryness.   Eyes:  Negative for pain, itching and dryness.  Respiratory:  Negative for shortness of breath and difficulty breathing.   Cardiovascular:  Negative for chest pain and  palpitations.  Gastrointestinal:  Negative for blood in stool, constipation and diarrhea.  Endocrine: Negative for increased urination.  Genitourinary:  Negative for difficulty urinating.  Musculoskeletal:  Positive for joint pain, joint pain, joint swelling and morning stiffness. Negative for myalgias, muscle tenderness and myalgias.  Skin:  Negative for color change, rash and redness.  Allergic/Immunologic: Negative for susceptible to infections.  Neurological:  Positive for numbness. Negative for dizziness, headaches, memory loss and weakness.  Hematological:  Negative for bruising/bleeding tendency.  Psychiatric/Behavioral:  Negative for confusion.    PMFS History:  Patient Active Problem List   Diagnosis Date Noted   Fatty liver 12/12/2019   History of prediabetes 05/23/2019   Osteoarthritis of both knees 03/20/2015   OSA on CPAP 08/15/2014   Fibromyalgia 01/31/2014   Depression, major, recurrent, in partial remission ([place]) 01/31/2014   Morbid obesity ([place]) - BMI 30+ with OSA 01/31/2014   Essential hypertension 08/09/2013   Vitamin D deficiency 08/09/2013    Past Medical History:  Diagnosis Date   Fibromyalgia    Hypertension    S/P total knee arthroplasty, left 01/07/2019   Simple renal cyst 12/12/2019   Incidentally noted on abd [place] 11/2019 - radiologist recommended further characterization with MRI w & w/o contrast   Hypoechoic lesions within the kidney which may simply represent mildly complicated cysts. The largest of these lies in the midportion of the kidney with increased echogenicity within  and suggestion of internal color flow which may be related to septation. MRI of the kidneys with and w   Status post total shoulder arthroplasty, left 01/07/2019   Vitamin D deficiency     Family History  Problem Relation Age of Onset   Hypertension Mother    Heart attack Mother    Hypertension Father    COPD Father    Cancer Father        thyroid   CVA Maternal Grandmother 68    Heart attack Maternal Grandfather 59   Deep vein thrombosis Maternal Grandfather    Colon cancer Neg Hx    Esophageal cancer Neg Hx    Stomach cancer Neg Hx    Past Surgical History:  Procedure Laterality Date   ABDOMINAL HYSTERECTOMY  2002   Dr. Brien Mates    COLONOSCOPY  2009   JOINT REPLACEMENT     arthroscopy   KNEE ARTHROPLASTY     TOTAL KNEE ARTHROPLASTY Right 04/14/2013   Procedure: RIGHT TOTAL KNEE ARTHROPLASTY AND LEFT KNEE INJECTION ;  Surgeon: Alta Corning, MD;  Location: [place];  Service: Orthopedics;  Laterality: Right;   TOTAL KNEE ARTHROPLASTY Left 01/06/2019   Procedure: TOTAL KNEE ARTHROPLASTY;  Surgeon: Dorna Leitz, MD;  Location: [facility];  Service: Orthopedics;  Laterality: Left;   Social History   Social History Narrative   Not on file   Immunization History  Administered Date(s) Administered   DT (Pediatric) 07/05/2015   Influenza Inj Mdck Quad With Preservative 05/28/2017, 04/13/2018   Influenza Split 04/06/2012, 04/05/2013, 05/16/2014, 03/21/2015   Influenza,inj,Quad PF,6+ Mos 03/16/2019   Influenza,inj,quad, With Preservative 04/29/2016   PFIZER(Purple Top)SARS-COV-2 Vaccination 11/08/2019, 11/29/2019, 08/13/2020   Pneumococcal Conjugate-13 05/16/2014   Pneumococcal-Unspecified 06/22/1998   Td 06/22/2004   Zoster, Live 10/21/2015     Objective: Vital Signs: BP 105/71 (BP Location: Left Arm, Patient Position: Sitting, Cuff Size: Normal)   Pulse 77   Ht $R'5\' 5"'gf$  (1.651 m)   Wt 196 lb 9.6 oz (89.2 kg)   BMI 32.72 kg/m    Physical Exam Vitals and nursing note reviewed.  Constitutional:      Appearance: She is well-developed.  HENT:     Head: Normocephalic and atraumatic.  Eyes:     Conjunctiva/sclera: Conjunctivae normal.  Pulmonary:     Effort: Pulmonary effort is normal.  Abdominal:     Palpations: Abdomen is soft.  Musculoskeletal:     Cervical back: Normal range of motion.  Lymphadenopathy:     Cervical: No cervical adenopathy.  Skin:     General: Skin is warm and dry.     Capillary Refill: Capillary refill takes less than 2 seconds.  Neurological:     Mental Status: She is alert and oriented to person, place, and time.  Psychiatric:        Behavior: Behavior normal.     Musculoskeletal Exam: C-spine, thoracic spine, and lumbar spine good ROM.  Shoulder joints, elbow joints, wrist joints, MCPs, PIPs, and DIPs good ROM with no synovitis.  PIP and DIP thickening consistent with osteoarthritis of both hands. No tenderness or inflammation over MCP joints.  Complete fist formation bilaterally.  Hip joints have good ROM with no discomfort.  Bilateral knee replacements have good range of motion with no discomfort.  Ankle joints have good range of motion with no tenderness or joint swelling.  CDAI Exam: CDAI Score: 0.8  Patient Global: 4 mm; Provider Global: 4 mm Swollen: 0 ; Tender: 0  Joint Exam  02/19/2021   No joint exam has been documented for this visit   There is currently no information documented on the homunculus. Go to the Rheumatology activity and complete the homunculus joint exam.  Investigation: No additional findings.  Imaging: No results found.  Recent Labs: Lab Results  Component Value Date   WBC 6.3 02/10/2021   HGB 14.0 02/10/2021   PLT 273 02/10/2021   NA 141 02/10/2021   K 4.0 02/10/2021   CL 102 02/10/2021   CO2 29 02/10/2021   GLUCOSE 113 (H) 02/10/2021   BUN 17 02/10/2021   CREATININE 0.73 02/10/2021   BILITOT 0.4 02/10/2021   ALKPHOS 89 01/03/2019   AST 28 02/10/2021   ALT 38 (H) 02/10/2021   PROT 7.1 02/10/2021   ALBUMIN 4.7 01/03/2019   CALCIUM 9.6 02/10/2021   GFRAA 103 09/02/2020    Speciality Comments: PLQ Eye Exam: 08/01/2020 WNL @ [place] Follow up in 1 year.   Procedures:  No procedures performed Allergies: Latex, Celecoxib, Cephalexin, Codeine, Erythromycin, Sulfonamide derivatives, Trazodone and nefazodone, and Venlafaxine   Assessment /  Plan:     Visit Diagnoses: Rheumatoid arthritis of multiple sites with negative rheumatoid factor ([place]) - RF-, +anti-CCP: She has no synovitis on examination today.  Overall her rheumatoid arthritis is well controlled on Plaquenil 200 mg 1 tablet by mouth twice daily Monday through Friday.  She is been experiencing increased pain and stiffness in both hands over the past couple of months.  Her discomfort seems to be due to underlying osteoarthritis.  She has no tenderness or synovitis over MCP joints.  Most of her discomfort has been in the PIP and DIP joints.  She has PIP and DIP thickening consistent with osteoarthritis of both hands.  We discussed the importance of joint protection and muscle strengthening.  X-rays of both hands were obtained on 11/13/2019 which were consistent with osteoarthritic changes.  No erosive changes were noted at that time.  We discussed the list of natural anti-inflammatories including turmeric, tart cherry, ginger, and omega-3.  We also discussed the use of arthritis compression gloves as well as Voltaren gel which she can apply topically as needed for pain relief.  She was advised to notify us if her discomfort persists or worsens and we can schedule an ultrasound of her hands at that time.  She will remain on Plaquenil 200 mg 1 tablet by mouth twice daily Monday through Friday.  She will follow-up in the office in 5 months.  High risk medication use - Plaquenil 200 mg 1 tablet by mouth twice daily Monday to Friday. CBC and CMP were updated on 02/10/2021.  Results were discussed with the patient today in the office.  She will continue to require updated lab work every 5 months to monitor for drug toxicity.PLQ Eye Exam: 08/01/2020 WNL @ [place] Follow up in 1 year.   Positive anti-CCP test - Anti-CCP 39 (weak positive), RF negative, ESR 6.   Primary osteoarthritis of both hands: She has PIP and DIP thickening consistent with osteoarthritis of both  hands.  No inflammation was noted on examination today.  She was able to make a complete fist bilaterally.  Wrist joints have good range of motion with no tenderness or inflammation.  She has been experiencing increased pain and stiffness in both hands and has noticed intermittent swelling over the past couple of months.  Over the past 3 weeks she has been painting  walls at the church which has exacerbated her pain.  At times she has experienced a burning sensation from her elbows to her hands but has not noticed any obvious numbness.  We discussed scheduling an NCV with EMG for further evaluation but she declined at this time. We discussed the importance of joint protection and muscle strengthening.  She was given a handout of hand exercises to perform.  She was also given a handout of natural anti-inflammatories to start taking.  She can also try using arthritis compression gloves as well as Voltaren gel topically for pain relief.  She was advised to notify us if her discomfort persists or worsens.  Primary osteoarthritis of both feet: She is not experiencing any increased discomfort in her feet at this time.  Status post total bilateral knee replacement - Dr. Berenice Primas: Doing well.  She has good range of motion with no discomfort.  No warmth or effusion noted.  Fibromyalgia: Overall her fibromyalgia has been well managed.  She is not experiencing any tender points or hyperalgesia at this time.  Discussed the importance of regular exercise and good sleep hygiene.  Other medical conditions are listed as follows:  Depression, major, recurrent, in partial remission ([place])  History of prediabetes  Essential hypertension: Blood pressure was 105/71 today in the office.  Mixed hyperlipidemia  OSA on CPAP  Vitamin D deficiency: She is taking vitamin D on a daily basis.  Orders: No orders of the defined types were placed in this encounter.  No orders of the defined types were placed in this  encounter.   Follow-Up Instructions: Return in about 5 months (around 07/22/2021) for Rheumatoid arthritis, Osteoarthritis, Fibromyalgia.   Ofilia Neas, PA-C  Note - This record has been created using Dragon software.  Chart creation errors have been sought, but may not always  have been located. Such creation errors do not reflect on  the standard of medical care.

## 2021-02-06 ENCOUNTER — Other Ambulatory Visit: Payer: Self-pay | Admitting: Adult Health

## 2021-02-10 ENCOUNTER — Other Ambulatory Visit: Payer: Self-pay | Admitting: *Deleted

## 2021-02-10 DIAGNOSIS — Z79899 Other long term (current) drug therapy: Secondary | ICD-10-CM

## 2021-02-10 LAB — CBC WITH DIFFERENTIAL/PLATELET
Absolute Monocytes: 725 cells/uL (ref 200–950)
Basophils Absolute: 38 cells/uL (ref 0–200)
Basophils Relative: 0.6 %
Eosinophils Absolute: 233 cells/uL (ref 15–500)
Eosinophils Relative: 3.7 %
HCT: 42.6 % (ref 35.0–45.0)
Hemoglobin: 14 g/dL (ref 11.7–15.5)
Lymphs Abs: 2085 cells/uL (ref 850–3900)
MCH: 30.1 pg (ref 27.0–33.0)
MCHC: 32.9 g/dL (ref 32.0–36.0)
MCV: 91.6 fL (ref 80.0–100.0)
MPV: 10.9 fL (ref 7.5–12.5)
Monocytes Relative: 11.5 %
Neutro Abs: 3219 cells/uL (ref 1500–7800)
Neutrophils Relative %: 51.1 %
Platelets: 273 10*3/uL (ref 140–400)
RBC: 4.65 10*6/uL (ref 3.80–5.10)
RDW: 12.9 % (ref 11.0–15.0)
Total Lymphocyte: 33.1 %
WBC: 6.3 10*3/uL (ref 3.8–10.8)

## 2021-02-10 LAB — COMPLETE METABOLIC PANEL WITH GFR
AG Ratio: 1.8 (calc) (ref 1.0–2.5)
ALT: 38 U/L — ABNORMAL HIGH (ref 6–29)
AST: 28 U/L (ref 10–35)
Albumin: 4.6 g/dL (ref 3.6–5.1)
Alkaline phosphatase (APISO): 99 U/L (ref 37–153)
BUN: 17 mg/dL (ref 7–25)
CO2: 29 mmol/L (ref 20–32)
Calcium: 9.6 mg/dL (ref 8.6–10.4)
Chloride: 102 mmol/L (ref 98–110)
Creat: 0.73 mg/dL (ref 0.50–1.05)
Globulin: 2.5 g/dL (calc) (ref 1.9–3.7)
Glucose, Bld: 113 mg/dL — ABNORMAL HIGH (ref 65–99)
Potassium: 4 mmol/L (ref 3.5–5.3)
Sodium: 141 mmol/L (ref 135–146)
Total Bilirubin: 0.4 mg/dL (ref 0.2–1.2)
Total Protein: 7.1 g/dL (ref 6.1–8.1)
eGFR: 92 mL/min/{1.73_m2} (ref >=60)

## 2021-02-12 DIAGNOSIS — D2272 Melanocytic nevi of left lower limb, including hip: Secondary | ICD-10-CM | POA: Diagnosis not present

## 2021-02-12 DIAGNOSIS — L723 Sebaceous cyst: Secondary | ICD-10-CM | POA: Diagnosis not present

## 2021-02-12 DIAGNOSIS — L821 Other seborrheic keratosis: Secondary | ICD-10-CM | POA: Diagnosis not present

## 2021-02-12 DIAGNOSIS — D225 Melanocytic nevi of trunk: Secondary | ICD-10-CM | POA: Diagnosis not present

## 2021-02-12 DIAGNOSIS — D2239 Melanocytic nevi of other parts of face: Secondary | ICD-10-CM | POA: Diagnosis not present

## 2021-02-12 DIAGNOSIS — D2230 Melanocytic nevi of unspecified part of face: Secondary | ICD-10-CM | POA: Diagnosis not present

## 2021-02-12 DIAGNOSIS — L578 Other skin changes due to chronic exposure to nonionizing radiation: Secondary | ICD-10-CM | POA: Diagnosis not present

## 2021-02-17 ENCOUNTER — Ambulatory Visit: Payer: Medicare PPO | Admitting: Physician Assistant

## 2021-02-19 ENCOUNTER — Encounter: Payer: Self-pay | Admitting: Physician Assistant

## 2021-02-19 ENCOUNTER — Other Ambulatory Visit: Payer: Self-pay

## 2021-02-19 ENCOUNTER — Ambulatory Visit: Payer: Medicare PPO | Admitting: Physician Assistant

## 2021-02-19 VITALS — BP 105/71 | HR 77 | Ht 65.0 in | Wt 196.6 lb

## 2021-02-19 DIAGNOSIS — I10 Essential (primary) hypertension: Secondary | ICD-10-CM

## 2021-02-19 DIAGNOSIS — E559 Vitamin D deficiency, unspecified: Secondary | ICD-10-CM

## 2021-02-19 DIAGNOSIS — Z87898 Personal history of other specified conditions: Secondary | ICD-10-CM | POA: Diagnosis not present

## 2021-02-19 DIAGNOSIS — G4733 Obstructive sleep apnea (adult) (pediatric): Secondary | ICD-10-CM

## 2021-02-19 DIAGNOSIS — M19071 Primary osteoarthritis, right ankle and foot: Secondary | ICD-10-CM

## 2021-02-19 DIAGNOSIS — R768 Other specified abnormal immunological findings in serum: Secondary | ICD-10-CM

## 2021-02-19 DIAGNOSIS — M0609 Rheumatoid arthritis without rheumatoid factor, multiple sites: Secondary | ICD-10-CM

## 2021-02-19 DIAGNOSIS — M19042 Primary osteoarthritis, left hand: Secondary | ICD-10-CM

## 2021-02-19 DIAGNOSIS — Z9989 Dependence on other enabling machines and devices: Secondary | ICD-10-CM

## 2021-02-19 DIAGNOSIS — M797 Fibromyalgia: Secondary | ICD-10-CM

## 2021-02-19 DIAGNOSIS — M19041 Primary osteoarthritis, right hand: Secondary | ICD-10-CM

## 2021-02-19 DIAGNOSIS — Z79899 Other long term (current) drug therapy: Secondary | ICD-10-CM

## 2021-02-19 DIAGNOSIS — F3341 Major depressive disorder, recurrent, in partial remission: Secondary | ICD-10-CM

## 2021-02-19 DIAGNOSIS — Z96653 Presence of artificial knee joint, bilateral: Secondary | ICD-10-CM

## 2021-02-19 DIAGNOSIS — E782 Mixed hyperlipidemia: Secondary | ICD-10-CM

## 2021-02-19 DIAGNOSIS — M19072 Primary osteoarthritis, left ankle and foot: Secondary | ICD-10-CM

## 2021-02-19 NOTE — Patient Instructions (Signed)
Hand Exercises Hand exercises can be helpful for almost anyone. These exercises can strengthen the hands, improve flexibility and movement, and increase blood flow to the hands. These results can make work and daily tasks easier. Hand exercises can be especially helpful for people who have joint pain from arthritis or have nerve damage from overuse (carpal tunnel syndrome). These exercises can also help people who have injured a hand. Exercises Most of these hand exercises are gentle stretching and motion exercises. It is usually safe to do them often throughout the day. Warming up your hands before exercise may help to reduce stiffness. You can do this with gentle massage or by placing your hands in warm water for 10-15 minutes. It is normal to feel some stretching, pulling, tightness, or mild discomfort as you begin new exercises. This will gradually improve. Stop an exercise right away if you feel sudden, severe pain or your pain gets worse. Ask your health care provider which exercises are best for you. Knuckle bend or "claw" fist  Stand or sit with your arm, hand, and all five fingers pointed straight up. Make sure to keep your wrist straight during the exercise. Gently bend your fingers down toward your palm until the tips of your fingers are touching the top of your palm. Keep your big knuckle straight and just bend the small knuckles in your fingers. Hold this position for __________ seconds. Straighten (extend) your fingers back to the starting position. Repeat this exercise 5-10 times with each hand. Full finger fist  Stand or sit with your arm, hand, and all five fingers pointed straight up. Make sure to keep your wrist straight during the exercise. Gently bend your fingers into your palm until the tips of your fingers are touching the middle of your palm. Hold this position for __________ seconds. Extend your fingers back to the starting position, stretching every joint fully. Repeat  this exercise 5-10 times with each hand. Straight fist Stand or sit with your arm, hand, and all five fingers pointed straight up. Make sure to keep your wrist straight during the exercise. Gently bend your fingers at the big knuckle, where your fingers meet your hand, and the middle knuckle. Keep the knuckle at the tips of your fingers straight and try to touch the bottom of your palm. Hold this position for __________ seconds. Extend your fingers back to the starting position, stretching every joint fully. Repeat this exercise 5-10 times with each hand. Tabletop  Stand or sit with your arm, hand, and all five fingers pointed straight up. Make sure to keep your wrist straight during the exercise. Gently bend your fingers at the big knuckle, where your fingers meet your hand, as far down as you can while keeping the small knuckles in your fingers straight. Think of forming a tabletop with your fingers. Hold this position for __________ seconds. Extend your fingers back to the starting position, stretching every joint fully. Repeat this exercise 5-10 times with each hand. Finger spread  Place your hand flat on a table with your palm facing down. Make sure your wrist stays straight as you do this exercise. Spread your fingers and thumb apart from each other as far as you can until you feel a gentle stretch. Hold this position for __________ seconds. Bring your fingers and thumb tight together again. Hold this position for __________ seconds. Repeat this exercise 5-10 times with each hand. Making circles  Stand or sit with your arm, hand, and all five fingers pointed   straight up. Make sure to keep your wrist straight during the exercise. Make a circle by touching the tip of your thumb to the tip of your index finger. Hold for __________ seconds. Then open your hand wide. Repeat this motion with your thumb and each finger on your hand. Repeat this exercise 5-10 times with each hand. Thumb  motion  Sit with your forearm resting on a table and your wrist straight. Your thumb should be facing up toward the ceiling. Keep your fingers relaxed as you move your thumb. Lift your thumb up as high as you can toward the ceiling. Hold for __________ seconds. Bend your thumb across your palm as far as you can, reaching the tip of your thumb for the small finger (pinkie) side of your palm. Hold for __________ seconds. Repeat this exercise 5-10 times with each hand. Grip strengthening  Hold a stress ball or other soft ball in the middle of your hand. Slowly increase the pressure, squeezing the ball as much as you can without causing pain. Think of bringing the tips of your fingers into the middle of your palm. All of your finger joints should bend when doing this exercise. Hold your squeeze for __________ seconds, then relax. Repeat this exercise 5-10 times with each hand. Contact a health care provider if: Your hand pain or discomfort gets much worse when you do an exercise. Your hand pain or discomfort does not improve within 2 hours after you exercise. If you have any of these problems, stop doing these exercises right away. Do not do them again unless your health care provider says that you can. Get help right away if: You develop sudden, severe hand pain or swelling. If this happens, stop doing these exercises right away. Do not do them again unless your health care provider says that you can. This information is not intended to replace advice given to you by your health care provider. Make sure you discuss any questions you have with your health care provider. Document Revised: 09/26/2020 Document Reviewed: 09/26/2020 Elsevier Patient Education  2022 [employer]  

## 2021-02-20 ENCOUNTER — Other Ambulatory Visit: Payer: Self-pay | Admitting: Physician Assistant

## 2021-02-20 DIAGNOSIS — M0609 Rheumatoid arthritis without rheumatoid factor, multiple sites: Secondary | ICD-10-CM

## 2021-02-20 NOTE — Telephone Encounter (Signed)
Next Visit: 07/23/2021  Last Visit: 02/19/2021  Labs: 02/10/2021, CBC WNL.  Glucose is 113.  Rest of CMP WNL.  Eye exam: 08/01/2020 WNL   Current Dose per office note 02/19/2021: Plaquenil 200 mg 1 tablet by mouth twice daily Monday through Friday  DX: Rheumatoid arthritis of multiple sites with negative rheumatoid factor   Last Fill: 12/06/2020  Okay to refill Plaquenil?

## 2021-03-10 ENCOUNTER — Ambulatory Visit: Payer: Medicare PPO | Admitting: Adult Health

## 2021-03-11 DIAGNOSIS — E785 Hyperlipidemia, unspecified: Secondary | ICD-10-CM | POA: Insufficient documentation

## 2021-03-11 NOTE — Progress Notes (Signed)
MEDICARE ANNUAL WELLNESS VISIT AND FOLLOW UP  Assessment:   Diagnoses and all orders for this visit:  Encounter for Medicare annual wellness exam Due annually  Health maintenance reviewed Schedule DEXA with next mammogram Get flu vaccine in Oct at pharmacy  Otherwise UTD  Essential hypertension Continue medications Monitor blood pressure at home; call if consistently over 130/80 Continue DASH diet.   Reminder to go to the ER if any CP, SOB, nausea, dizziness, severe HA, changes vision/speech, left arm numbness and tingling and jaw pain.  Vitamin D deficiency At goal at recent check; continue to recommend supplementation for goal of 60-100 Defer vitamin D level  Prediabetes Discussed disease and risks Discussed diet/exercise, weight management  A1C q58m;   Morbid obesity - BMI 30+ with OSA  Long discussion about weight loss, diet, and exercise Recommended diet heavy in fruits and veggies and low in animal meats, cheeses, and dairy products, appropriate calorie intake Reduce candy bars, aim for 0.5-1 lb weight loss per week  Follow up at next visit  Mixed hyperlipidemia Continue medications: crestor Continue low cholesterol diet and exercise.  Check lipid panel.   Medication management CBC, CMP/GFR  Fibromyalgia Lifestyle discussed: diet/exerise, sleep hygiene, stress management, hydration Continue current medications for sleep management  Depression, major, recurrent, in partial remission ([place])  Psych manages meds  Lifestyle discussed: diet/exerise, sleep hygiene, stress management, hydration  OSA on CPAP CPAP - reporting 100% compliance with improved restorative sleep and no excessive daytime sleepiness.   Primary osteoarthritis of both knees Followed by Dr. Berenice Primas, s/p bil TKA and doing well   Seronegative RA ([place]) Followed by Dr. Estanislado Pandy;  On hydroxychloroquine and ginger root Antiinflammatory diet discussed   Estrogen def - Dexa ordered to  scheduled  Over 40 minutes of exam, counseling, chart review and critical decision making was performed Future Appointments  Date Time Provider [place]  07/23/2021  1:00 PM Bo Merino, MD [facility] None  09/15/2021 10:00 AM Unk Pinto, MD [facility] None  03/13/2022 10:00 AM Liane Comber, NP [facility] None     Plan:   During the course of the visit the patient was educated and counseled about appropriate screening and preventive services including:   Pneumococcal vaccine  Prevnar 13 Influenza vaccine Td vaccine Screening electrocardiogram Bone densitometry screening Colorectal cancer screening Diabetes screening Glaucoma screening Nutrition counseling  Advanced directives: requested   Subjective:  [name] is a 65 y.o. female who presents for Medicare Annual Wellness Visit and 3 month follow up. She has Essential hypertension; Vitamin D deficiency; Fibromyalgia; Depression, major, recurrent, in partial remission ([place]); Morbid obesity ([place]) - BMI 30+ with OSA; OSA on CPAP; Osteoarthritis of both knees; Other abnormal glucose (prediabetes); Fatty liver; Hyperlipidemia; and Seronegative rheumatoid arthritis ([place]) on their problem list.  Patient has been on SS Disability since age 45 (2007) for Panic, Anxiety & Depression, and also has fibromyalgia. She is followed by PA Gareth Eagle for mental health management. She reports she is doing very well at this time.   She currently uses klonopin very rarely to help with sleep (1-2 tabs per month). Uses melatonin when able. This is managed by mental health provider.   OSA on CPAP with reported 100% compliance and endorses restorative sleep.   She is followed by Dr. Berenice Primas for arthritis; s/p bil TKA. Newly seeing Dr. Estanislado Pandy for seronegative RA complicated by OA, now on hydroxychloroquine and started on ginger root. Has burning in hands that bothers her at night. Taking tylenol to  help managed -   BMI is Body  mass index is 33.12 kg/m., she has been working on diet, exercise limited due to knee pain, admits had picked back up on junk food, initial goal is <180 lb.  Wt Readings from Last 3 Encounters:  03/12/21 199 lb (90.3 kg)  02/19/21 196 lb 9.6 oz (89.2 kg)  09/16/20 194 lb (88 kg)    Her blood pressure has been controlled at home, today their BP is BP: 122/80 She does not workout. She denies chest pain, shortness of breath, dizziness.   She is on cholesterol medication (crestor 20 mg daily) and denies myalgias. Her LDL cholesterol is at goal. The cholesterol last visit was:   Lab Results  Component Value Date   CHOL 127 09/02/2020   HDL 53 09/02/2020   LDLCALC 52 09/02/2020   TRIG 132 09/02/2020   CHOLHDL 2.4 09/02/2020    She has not been working on diet and exercise for prediabetes, and denies foot ulcerations, increased appetite, nausea, paresthesia of the feet, polydipsia, polyuria, visual disturbances, vomiting and weight loss. Last A1C in the office was:  Lab Results  Component Value Date   HGBA1C 6.1 (H) 09/02/2020   Last GFR: Lab Results  Component Value Date   GFRNONAA 89 09/02/2020   Patient is on Vitamin D supplement and at goal at recent check:   Lab Results  Component Value Date   VD25OH 77 09/02/2020       Medication Review: Current Outpatient Medications on File Prior to Visit  Medication Sig Dispense Refill   acetaminophen (TYLENOL) 500 MG tablet Take 500 mg by mouth every 6 (six) hours as needed.     acyclovir (ZOVIRAX) 400 MG tablet Take 1 tablet (400 mg total) by mouth 3 (three) times daily as needed (fever blisters). 21 tablet 1   aspirin EC 81 MG tablet Take 81 mg by mouth daily.     cetirizine (ZYRTEC) 10 MG tablet Take 10 mg by mouth daily.      Cholecalciferol (VITAMIN D PO) Take 8,000 Int'l Units by mouth daily. 2000 units per capsule     CINNAMON PO Take 2,000 mg by mouth daily.     clonazePAM (KLONOPIN) 1 MG tablet Take 1 mg by mouth at bedtime as  needed for anxiety.      diclofenac Sodium (VOLTAREN) 1 % GEL Apply topically daily.     Ginger, Zingiber officinalis, (GINGER ROOT) 500 MG CAPS Take by mouth 3 (three) times daily.     glucose blood (ACCU-CHEK AVIVA PLUS) test strip CHECK BLOOD SUGAR 1 TIME DAILY. DX-R73.03 100 each 1   hydrochlorothiazide (HYDRODIURIL) 25 MG tablet TAKE 1 TABLET BY MOUTH DAILY FOR BLOOD PRESSURE AND FLUID RETENTION OR ANKLE SWELLING 90 tablet 3   hydroxychloroquine (PLAQUENIL) 200 MG tablet TAKE 1 TABLET BY MOUTH TWICE DAILY ON MONDAY THROUGH FRIDAY ONLY 120 tablet 0   ibuprofen (ADVIL) 200 MG tablet Take 200 mg by mouth every 6 (six) hours as needed.     lamoTRIgine (LAMICTAL) 150 MG tablet Take 1 & 1/2 tablets at bedtime (Patient taking differently: Take 1 tablet at bedtime) 135 tablet 1   Lancets (ACCU-CHEK MULTICLIX) lancets Use to check blood glucose daily 100 each PRN   Lysine 500 MG TABS Take 1,000 mg by mouth daily. Currently taking 2 pill QD     Magnesium 500 MG TABS Take 2,500 mg by mouth daily. Takes 4 tablets daily     Multiple Vitamin (MULTIVITAMIN WITH MINERALS)  TABS tablet Take 0.5 tablets by mouth 2 (two) times daily.     QUEtiapine (SEROQUEL) 100 MG tablet Take 100 mg by mouth 3 (three) times daily.     rosuvastatin (CRESTOR) 40 MG tablet Take 1 tablet Daily for Cholesterol 90 tablet 3   vitamin C (ASCORBIC ACID) 500 MG tablet Take 500 mg by mouth 2 (two) times daily.     zinc gluconate 50 MG tablet Take 50 mg by mouth daily.     No current facility-administered medications on file prior to visit.    Allergies  Allergen Reactions   Latex     Band aids= if left on for extended period of time   Celecoxib     REACTION: Rash (celebrex)   Cephalexin     REACTION: Rash (keflex)   Codeine     REACTION: Rash   Erythromycin     REACTION: Rash (emycin)   Sulfonamide Derivatives     REACTION: Rash   Trazodone And Nefazodone     insomnia   Venlafaxine     REACTION: Blurred Vision (effexor)     Current Problems (verified) Patient Active Problem List   Diagnosis Date Noted   Seronegative rheumatoid arthritis ([place]) 03/12/2021   Hyperlipidemia 03/11/2021   Fatty liver 12/12/2019   Other abnormal glucose (prediabetes) 05/23/2019   Osteoarthritis of both knees 03/20/2015   OSA on CPAP 08/15/2014   Fibromyalgia 01/31/2014   Depression, major, recurrent, in partial remission ([place]) 01/31/2014   Morbid obesity ([place]) - BMI 30+ with OSA 01/31/2014   Essential hypertension 08/09/2013   Vitamin D deficiency 08/09/2013    Screening Tests Immunization History  Administered Date(s) Administered   DT (Pediatric) 07/05/2015   Influenza Inj Mdck Quad With Preservative 05/28/2017, 04/13/2018   Influenza Split 04/06/2012, 04/05/2013, 05/16/2014, 03/21/2015   Influenza,inj,Quad PF,6+ Mos 03/16/2019   Influenza,inj,quad, With Preservative 04/29/2016   PFIZER(Purple Top)SARS-COV-2 Vaccination 11/08/2019, 11/29/2019, 08/13/2020   Pneumococcal Conjugate-13 05/16/2014   Pneumococcal-Unspecified 06/22/1998   Td 06/22/2004   Zoster, Live 10/21/2015   Preventative care: Last colonoscopy: 2019 due 11/2027 Mammogram:  06/2020 DEXA: 2013 normal - will do 10 year follow up, order placed  Pap:  S/p Hysterectomy, DONE   Prior vaccinations:  TD or Tdap: 2017 Influenza: 2020, encouraged to get in Oct  Pneumococcal: 2000 Prevnar13: 2015 Shingles/Zostavax: 2017, had first shot of shingrix with reaction, declined second shot Covid 19: 2/2, 2021, pfizer - booster 07/2020  Names of Other Physician/Practitioners you currently use: 1. [facility] here for primary care 2. Dr. Erven Colla, eye doctor, last visit 2022, glasses, goes annually, monitoring mild cataracts  3. Dr. Jasmine December, dentist, last visit 2022, goes q79m   Patient Care Team: Unk Pinto, MD as PCP - General (Internal Medicine) Dorna Leitz, MD as Consulting Physician (Orthopedic  Surgery) Milus Banister, MD as Attending Physician (Gastroenterology) Sheralyn Boatman, MD as Consulting Physician (Psychiatry) [name], [place] (Optometry) Pauline Good, NP as Consulting Physician (Nurse Practitioner)  SURGICAL HISTORY She  has a past surgical history that includes Abdominal hysterectomy (2002); Joint replacement; Total knee arthroplasty (Right, 04/14/2013); Colonoscopy (2009); Total knee arthroplasty (Left, 01/06/2019); and Knee Arthroplasty. FAMILY HISTORY Her family history includes COPD in her father; CVA (age of onset: 44) in her maternal grandmother; Cancer in her father; Deep vein thrombosis in her maternal grandfather; Heart attack in her mother; Heart attack (age of onset: 34) in her maternal grandfather; Hypertension in her father and mother. SOCIAL HISTORY She  reports that  she has never smoked. She has never used smokeless tobacco. She reports that she does not drink alcohol and does not use drugs.   MEDICARE WELLNESS OBJECTIVES: Physical activity: Current Exercise Habits: The patient does not participate in regular exercise at present, Exercise limited by: None identified Cardiac risk factors: Cardiac Risk Factors include: advanced age (>41men, >68 women);dyslipidemia;hypertension;sedentary lifestyle;obesity (BMI >30kg/m2) Depression/mood screen:   Depression screen [facility] 2/9 03/12/2021  Decreased Interest 0  Down, Depressed, Hopeless 0  PHQ - 2 Score 0  Altered sleeping -  Tired, decreased energy -  Change in appetite -  Feeling bad or failure about yourself  -  Trouble concentrating -  Moving slowly or fidgety/restless -  Suicidal thoughts -  PHQ-9 Score -  Difficult doing work/chores -    ADLs:  In your present state of health, do you have any difficulty performing the following activities: 03/12/2021 08/31/2020  Hearing? N N  Vision? N N  Difficulty concentrating or making decisions? N N  Walking or climbing stairs? N N  Dressing or bathing? N N   Doing errands, shopping? N N  Some recent data might be hidden     Cognitive Testing  Alert? Yes  Normal Appearance?Yes  Oriented to person? Yes  Place? Yes   Time? Yes  Recall of three objects?  Yes  Can perform simple calculations? Yes  Displays appropriate judgment?Yes  Can read the correct time from a watch face?Yes  EOL planning: Does Patient Have a Medical Advance Directive?: No Would patient like information on creating a medical advance directive?: No - Patient declined  Review of Systems  Constitutional:  Negative for malaise/fatigue and weight loss.  HENT:  Negative for congestion, hearing loss, sore throat and tinnitus.   Eyes:  Negative for blurred vision and double vision.  Respiratory:  Negative for cough, sputum production, shortness of breath and wheezing.   Cardiovascular:  Negative for chest pain, palpitations, orthopnea, claudication, leg swelling and PND.  Gastrointestinal:  Negative for abdominal pain, blood in stool, constipation, diarrhea, heartburn, melena, nausea and vomiting.  Genitourinary: Negative.   Musculoskeletal:  Positive for joint pain (R foot, bil hands). Negative for falls and myalgias.  Skin:  Negative for rash.  Neurological:  Negative for dizziness, tingling, sensory change, weakness and headaches.  Endo/Heme/Allergies:  Negative for environmental allergies and polydipsia.  Psychiatric/Behavioral: Negative.  Negative for depression, memory loss, substance abuse and suicidal ideas. The patient is not nervous/anxious and does not have insomnia.   All other systems reviewed and are negative.   Objective:     Today's Vitals   03/12/21 0925  BP: 122/80  Pulse: 82  Temp: 97.9 F (36.6 C)  SpO2: 98%  Weight: 199 lb (90.3 kg)   Body mass index is 33.12 kg/m.  General appearance: alert, no distress, WD/WN, obese female HEENT: normocephalic, sclerae anicteric, TMs pearly, nares patent, no discharge or erythema, pharynx normal Oral  cavity: MMM, no lesions Neck: supple, no lymphadenopathy, no thyromegaly, no masses Heart: RRR, normal S1, S2, no murmurs Lungs: CTA bilaterally, no wheezes, rhonchi, or rales Abdomen: +bs, soft, obese non tender, non distended, no masses, no hepatomegaly, no splenomegaly Musculoskeletal: Mild bony enlargement with swelling bil hand DIPs with mild swelling, otherwise no obvious deformity, no swelling, non-antalgic gait.  Extremities: no edema, no cyanosis, no clubbing Pulses: 2+ symmetric, upper and lower extremities, normal cap refill Neurological: alert, oriented x 3, CN2-12 intact, strength normal upper extremities and lower extremities, sensation normal throughout, DTRs 2+  throughout, no cerebellar signs, gait antalgit Psychiatric: normal affect, behavior normal, pleasant, somewhat anxious   Medicare Attestation I have personally reviewed: The patient's medical and social history Their use of alcohol, tobacco or illicit drugs Their current medications and supplements The patient's functional ability including ADLs,fall risks, home safety risks, cognitive, and hearing and visual impairment Diet and physical activities Evidence for depression or mood disorders  The patient's weight, height, BMI, and visual acuity have been recorded in the chart.  I have made referrals, counseling, and provided education to the patient based on review of the above and I have provided the patient with a written personalized care plan for preventive services.     Izora Ribas, NP   03/12/2021

## 2021-03-12 ENCOUNTER — Other Ambulatory Visit: Payer: Self-pay

## 2021-03-12 ENCOUNTER — Encounter: Payer: Self-pay | Admitting: Adult Health

## 2021-03-12 ENCOUNTER — Ambulatory Visit: Payer: Medicare PPO | Admitting: Adult Health

## 2021-03-12 VITALS — BP 122/80 | HR 82 | Temp 97.9°F | Wt 199.0 lb

## 2021-03-12 DIAGNOSIS — E782 Mixed hyperlipidemia: Secondary | ICD-10-CM | POA: Diagnosis not present

## 2021-03-12 DIAGNOSIS — E2839 Other primary ovarian failure: Secondary | ICD-10-CM

## 2021-03-12 DIAGNOSIS — Z0000 Encounter for general adult medical examination without abnormal findings: Secondary | ICD-10-CM

## 2021-03-12 DIAGNOSIS — E559 Vitamin D deficiency, unspecified: Secondary | ICD-10-CM | POA: Diagnosis not present

## 2021-03-12 DIAGNOSIS — M797 Fibromyalgia: Secondary | ICD-10-CM | POA: Diagnosis not present

## 2021-03-12 DIAGNOSIS — Z9989 Dependence on other enabling machines and devices: Secondary | ICD-10-CM

## 2021-03-12 DIAGNOSIS — F3341 Major depressive disorder, recurrent, in partial remission: Secondary | ICD-10-CM

## 2021-03-12 DIAGNOSIS — M0600 Rheumatoid arthritis without rheumatoid factor, unspecified site: Secondary | ICD-10-CM | POA: Diagnosis not present

## 2021-03-12 DIAGNOSIS — R6889 Other general symptoms and signs: Secondary | ICD-10-CM

## 2021-03-12 DIAGNOSIS — G4733 Obstructive sleep apnea (adult) (pediatric): Secondary | ICD-10-CM | POA: Diagnosis not present

## 2021-03-12 DIAGNOSIS — K760 Fatty (change of) liver, not elsewhere classified: Secondary | ICD-10-CM

## 2021-03-12 DIAGNOSIS — Z0001 Encounter for general adult medical examination with abnormal findings: Secondary | ICD-10-CM

## 2021-03-12 DIAGNOSIS — R7309 Other abnormal glucose: Secondary | ICD-10-CM | POA: Diagnosis not present

## 2021-03-12 DIAGNOSIS — M170 Bilateral primary osteoarthritis of knee: Secondary | ICD-10-CM | POA: Diagnosis not present

## 2021-03-12 DIAGNOSIS — I10 Essential (primary) hypertension: Secondary | ICD-10-CM

## 2021-03-12 NOTE — Patient Instructions (Addendum)
Goals      Exercise 150 min/wk Moderate Activity     HEMOGLOBIN A1C < 5.7     Weight (lb) < 180 lb (81.6 kg)         High-Fiber Eating Plan Fiber, also called dietary fiber, is a type of carbohydrate. It is found foods such as fruits, vegetables, whole grains, and beans. A high-fiber diet can have many health benefits. Your health care provider may recommend a high-fiber diet to help: Prevent constipation. Fiber can make your bowel movements more regular. Lower your cholesterol. Relieve the following conditions: Inflammation of veins in the anus (hemorrhoids). Inflammation of specific areas of the digestive tract (uncomplicated diverticulosis). A problem of the large intestine, also called the colon, that sometimes causes pain and diarrhea (irritable bowel syndrome, or IBS). Prevent overeating as part of a weight-loss plan. Prevent heart disease, type 2 diabetes, and certain cancers. What are tips for following this plan? Reading food labels  Check the nutrition facts label on food products for the amount of dietary fiber. Choose foods that have 5 grams of fiber or more per serving. The goals for recommended daily fiber intake include: Men (age 77 or younger): 34-38 g. Men (over age 52): 28-34 g. Women (age 21 or younger): 25-28 g. Women (over age 36): 22-25 g. Your daily fiber goal is _____________ g. Shopping Choose whole fruits and vegetables instead of processed forms, such as apple juice or applesauce. Choose a wide variety of high-fiber foods such as avocados, lentils, oats, and kidney beans. Read the nutrition facts label of the foods you choose. Be aware of foods with added fiber. These foods often have high sugar and sodium amounts per serving. Cooking Use whole-grain flour for baking and cooking. Cook with brown rice instead of white rice. Meal planning Start the day with a breakfast that is high in fiber, such as a cereal that contains 5 g of fiber or more per  serving. Eat breads and cereals that are made with whole-grain flour instead of refined flour or white flour. Eat brown rice, bulgur wheat, or millet instead of white rice. Use beans in place of meat in soups, salads, and pasta dishes. Be sure that half of the grains you eat each day are whole grains. General information You can get the recommended daily intake of dietary fiber by: Eating a variety of fruits, vegetables, grains, nuts, and beans. Taking a fiber supplement if you are not able to take in enough fiber in your diet. It is better to get fiber through food than from a supplement. Gradually increase how much fiber you consume. If you increase your intake of dietary fiber too quickly, you may have bloating, cramping, or gas. Drink plenty of water to help you digest fiber. Choose high-fiber snacks, such as berries, raw vegetables, nuts, and popcorn. What foods should I eat? Fruits Berries. Pears. Apples. Oranges. Avocado. Prunes and raisins. Dried figs. Vegetables Sweet potatoes. Spinach. Kale. Artichokes. Cabbage. Broccoli. Cauliflower. Green peas. Carrots. Squash. Grains Whole-grain breads. Multigrain cereal. Oats and oatmeal. Brown rice. Barley. Bulgur wheat. [place]. Quinoa. Bran muffins. Popcorn. Rye wafer crackers. Meats and other proteins Navy beans, kidney beans, and pinto beans. Soybeans. Split peas. Lentils. Nuts and seeds. Dairy Fiber-fortified yogurt. Beverages Fiber-fortified soy milk. Fiber-fortified orange juice. Other foods Fiber bars. The items listed above may not be a complete list of recommended foods and beverages. Contact a dietitian for more information. What foods should I avoid? Fruits Fruit juice. Cooked, strained fruit. Vegetables  Fried potatoes. Canned vegetables. Well-cooked vegetables. Grains White bread. Pasta made with refined flour. White rice. Meats and other proteins Fatty cuts of meat. Fried chicken or fried fish. Dairy Milk. Yogurt.  Cream cheese. Sour cream. Fats and oils Butters. Beverages Soft drinks. Other foods Cakes and pastries. The items listed above may not be a complete list of foods and beverages to avoid. Talk with your dietitian about what choices are best for you. Summary Fiber is a type of carbohydrate. It is found in foods such as fruits, vegetables, whole grains, and beans. A high-fiber diet has many benefits. It can help to prevent constipation, lower blood cholesterol, aid weight loss, and reduce your risk of heart disease, diabetes, and certain cancers. Increase your intake of fiber gradually. Increasing fiber too quickly may cause cramping, bloating, and gas. Drink plenty of water while you increase the amount of fiber you consume. The best sources of fiber include whole fruits and vegetables, whole grains, nuts, seeds, and beans. This information is not intended to replace advice given to you by your health care provider. Make sure you discuss any questions you have with your health care provider. Document Revised: 10/12/2019 Document Reviewed: 10/12/2019 Elsevier Patient Education  2022 [employer].

## 2021-03-13 LAB — LIPID PANEL
Cholesterol: 141 mg/dL (ref <200)
HDL: 60 mg/dL (ref >=50)
LDL Cholesterol (Calc): 59 mg/dL (calc)
Non-HDL Cholesterol (Calc): 81 mg/dL (calc) (ref <130)
Total CHOL/HDL Ratio: 2.4 (calc) (ref <5.0)
Triglycerides: 136 mg/dL (ref <150)

## 2021-03-13 LAB — COMPLETE METABOLIC PANEL WITH GFR
AG Ratio: 1.6 (calc) (ref 1.0–2.5)
ALT: 33 U/L — ABNORMAL HIGH (ref 6–29)
AST: 23 U/L (ref 10–35)
Albumin: 4.5 g/dL (ref 3.6–5.1)
Alkaline phosphatase (APISO): 90 U/L (ref 37–153)
BUN: 21 mg/dL (ref 7–25)
CO2: 29 mmol/L (ref 20–32)
Calcium: 10 mg/dL (ref 8.6–10.4)
Chloride: 103 mmol/L (ref 98–110)
Creat: 0.62 mg/dL (ref 0.50–1.05)
Globulin: 2.8 g/dL (calc) (ref 1.9–3.7)
Glucose, Bld: 100 mg/dL — ABNORMAL HIGH (ref 65–99)
Potassium: 4.6 mmol/L (ref 3.5–5.3)
Sodium: 141 mmol/L (ref 135–146)
Total Bilirubin: 0.5 mg/dL (ref 0.2–1.2)
Total Protein: 7.3 g/dL (ref 6.1–8.1)
eGFR: 99 mL/min/{1.73_m2} (ref >=60)

## 2021-03-13 LAB — CBC WITH DIFFERENTIAL/PLATELET
Absolute Monocytes: 437 cells/uL (ref 200–950)
Basophils Absolute: 32 cells/uL (ref 0–200)
Basophils Relative: 0.7 %
Eosinophils Absolute: 140 cells/uL (ref 15–500)
Eosinophils Relative: 3.1 %
HCT: 43.8 % (ref 35.0–45.0)
Hemoglobin: 14.4 g/dL (ref 11.7–15.5)
Lymphs Abs: 1314 cells/uL (ref 850–3900)
MCH: 30.2 pg (ref 27.0–33.0)
MCHC: 32.9 g/dL (ref 32.0–36.0)
MCV: 91.8 fL (ref 80.0–100.0)
MPV: 10.8 fL (ref 7.5–12.5)
Monocytes Relative: 9.7 %
Neutro Abs: 2579 cells/uL (ref 1500–7800)
Neutrophils Relative %: 57.3 %
Platelets: 275 10*3/uL (ref 140–400)
RBC: 4.77 10*6/uL (ref 3.80–5.10)
RDW: 13 % (ref 11.0–15.0)
Total Lymphocyte: 29.2 %
WBC: 4.5 10*3/uL (ref 3.8–10.8)

## 2021-03-13 LAB — MAGNESIUM: Magnesium: 2.3 mg/dL (ref 1.5–2.5)

## 2021-03-13 LAB — HEMOGLOBIN A1C
Hgb A1c MFr Bld: 5.8 % of total Hgb — ABNORMAL HIGH (ref <5.7)
Mean Plasma Glucose: 120 mg/dL
eAG (mmol/L): 6.6 mmol/L

## 2021-03-13 LAB — TSH: TSH: 1.9 mIU/L (ref 0.40–4.50)

## 2021-03-25 ENCOUNTER — Other Ambulatory Visit: Payer: Self-pay | Admitting: Adult Health

## 2021-03-25 DIAGNOSIS — Z1231 Encounter for screening mammogram for malignant neoplasm of breast: Secondary | ICD-10-CM

## 2021-05-16 ENCOUNTER — Other Ambulatory Visit: Payer: Self-pay | Admitting: Physician Assistant

## 2021-05-16 DIAGNOSIS — M0609 Rheumatoid arthritis without rheumatoid factor, multiple sites: Secondary | ICD-10-CM

## 2021-05-19 NOTE — Telephone Encounter (Signed)
Next Visit: 07/23/2021   Last Visit: 02/19/2021   Labs: 03/12/2021 ALT 33, Glucose 100   Eye exam: 08/01/2020 WNL    Current Dose per office note 02/19/2021: Plaquenil 200 mg 1 tablet by mouth twice daily Monday through Friday   DX: Rheumatoid arthritis of multiple sites with negative rheumatoid factor    Last Fill: 02/20/2021   Okay to refill Plaquenil?

## 2021-06-04 DIAGNOSIS — F3132 Bipolar disorder, current episode depressed, moderate: Secondary | ICD-10-CM | POA: Diagnosis not present

## 2021-07-11 NOTE — Progress Notes (Signed)
Office Visit Note  Patient: [name]             Date of Birth: 1956/05/31           MRN: [id number]             PCP: Unk Pinto, MD Referring: Unk Pinto, MD Visit Date: 07/23/2021 Occupation: @GUAROCC @  Subjective:  Medication management.   History of Present Illness: [name] is a 66 y.o. female with history of seronegative rheumatoid arthritis and osteoarthritis overlap.  She states she is doing quite well on hydroxychloroquine 200 mg p.o. twice daily Monday to Friday.  She denies any joint swelling.  She continues to have some joint stiffness in her hands and discomfort.  Bilateral knee replacements are doing well.  She experiences some pain in her feet off and on.  She has generalized pain and discomfort from fibromyalgia.  Activities of Daily Living:  Patient reports morning stiffness for 10 minutes.   Patient Reports nocturnal pain.  Difficulty dressing/grooming: Denies Difficulty climbing stairs: Denies Difficulty getting out of chair: Reports Difficulty using hands for taps, buttons, cutlery, and/or writing: Denies  Review of Systems  Constitutional:  Negative for fatigue, night sweats, weight gain and weight loss.  HENT:  Negative for mouth sores, trouble swallowing, trouble swallowing, mouth dryness and nose dryness.   Eyes:  Negative for pain, redness, visual disturbance and dryness.  Respiratory:  Negative for cough, shortness of breath and difficulty breathing.   Cardiovascular:  Negative for chest pain, palpitations and hypertension.  Gastrointestinal:  Negative for blood in stool, constipation and diarrhea.  Endocrine: Negative for increased urination.  Genitourinary:  Negative for vaginal dryness.  Musculoskeletal:  Positive for joint pain, joint pain and morning stiffness. Negative for joint swelling, myalgias, muscle weakness, muscle tenderness and myalgias.  Skin:  Negative for color change, rash, hair loss, skin tightness, ulcers and  sensitivity to sunlight.  Allergic/Immunologic: Negative for susceptible to infections.  Neurological:  Negative for dizziness, memory loss, night sweats and weakness.  Hematological:  Negative for swollen glands.  Psychiatric/Behavioral:  Negative for depressed mood and sleep disturbance. The patient is not nervous/anxious.    PMFS History:  Patient Active Problem List   Diagnosis Date Noted   Seronegative rheumatoid arthritis ([place]) 03/12/2021   Hyperlipidemia 03/11/2021   Fatty liver 12/12/2019   Other abnormal glucose (prediabetes) 05/23/2019   Osteoarthritis of both knees 03/20/2015   OSA on CPAP 08/15/2014   Fibromyalgia 01/31/2014   Depression, major, recurrent, in partial remission ([place]) 01/31/2014   Morbid obesity ([place]) - BMI 30+ with OSA 01/31/2014   Essential hypertension 08/09/2013   Vitamin D deficiency 08/09/2013    Past Medical History:  Diagnosis Date   Fibromyalgia    Hypertension    S/P total knee arthroplasty, left 01/07/2019   Simple renal cyst 12/12/2019   Incidentally noted on abd [place] 11/2019 - radiologist recommended further characterization with MRI w & w/o contrast   Hypoechoic lesions within the kidney which may simply represent mildly complicated cysts. The largest of these lies in the midportion of the kidney with increased echogenicity within and suggestion of internal color flow which may be related to septation. MRI of the kidneys with and w   Status post total shoulder arthroplasty, left 01/07/2019   Vitamin D deficiency     Family History  Problem Relation Age of Onset   Hypertension Mother    Heart attack Mother    Hypertension Father  COPD Father    Cancer Father        thyroid   CVA Maternal Grandmother 19   Heart attack Maternal Grandfather 59   Deep vein thrombosis Maternal Grandfather    Colon cancer Neg Hx    Esophageal cancer Neg Hx    Stomach cancer Neg Hx    Past Surgical History:  Procedure Laterality Date   ABDOMINAL  HYSTERECTOMY  2002   Dr. Brien Mates    COLONOSCOPY  2009   JOINT REPLACEMENT     arthroscopy   KNEE ARTHROPLASTY     TOTAL KNEE ARTHROPLASTY Right 04/14/2013   Procedure: RIGHT TOTAL KNEE ARTHROPLASTY AND LEFT KNEE INJECTION ;  Surgeon: Alta Corning, MD;  Location: [place];  Service: Orthopedics;  Laterality: Right;   TOTAL KNEE ARTHROPLASTY Left 01/06/2019   Procedure: TOTAL KNEE ARTHROPLASTY;  Surgeon: Dorna Leitz, MD;  Location: [facility];  Service: Orthopedics;  Laterality: Left;   Social History   Social History Narrative   Not on file   Immunization History  Administered Date(s) Administered   DT (Pediatric) 07/05/2015   Influenza Inj Mdck Quad With Preservative 05/28/2017, 04/13/2018   Influenza Split 04/06/2012, 04/05/2013, 05/16/2014, 03/21/2015   Influenza,inj,Quad PF,6+ Mos 03/16/2019   Influenza,inj,quad, With Preservative 04/29/2016   PFIZER(Purple Top)SARS-COV-2 Vaccination 11/08/2019, 11/29/2019, 08/13/2020   Pneumococcal Conjugate-13 05/16/2014   Pneumococcal-Unspecified 06/22/1998   Td 06/22/2004   Zoster, Live 10/21/2015     Objective: Vital Signs: BP 128/82 (BP Location: Left Arm, Patient Position: Sitting, Cuff Size: Large)    Pulse 91    Resp 12    Ht $R'5\' 4"'Og$  (1.626 m)    Wt 199 lb (90.3 kg)    BMI 34.16 kg/m    Physical Exam Vitals and nursing note reviewed.  Constitutional:      Appearance: She is well-developed.  HENT:     Head: Normocephalic and atraumatic.  Eyes:     Conjunctiva/sclera: Conjunctivae normal.  Cardiovascular:     Rate and Rhythm: Normal rate and regular rhythm.     Heart sounds: Normal heart sounds.  Pulmonary:     Effort: Pulmonary effort is normal.     Breath sounds: Normal breath sounds.  Abdominal:     General: Bowel sounds are normal.     Palpations: Abdomen is soft.  Musculoskeletal:     Cervical back: Normal range of motion.  Lymphadenopathy:     Cervical: No cervical adenopathy.  Skin:    General: Skin is warm and dry.      Capillary Refill: Capillary refill takes less than 2 seconds.  Neurological:     Mental Status: She is alert and oriented to person, place, and time.  Psychiatric:        Behavior: Behavior normal.     Musculoskeletal Exam: C-spine was not good range of motion.  Thoracic kyphosis noted.  Shoulder joints, elbow joints, wrist joints, MCPs PIPs and DIPs and good range of motion.  She had bilateral PIP and DIP thickening.  Hip joints.  Good range of motion.  She had bilateral knee joint replacement with some warmth on palpation.  She had no tenderness over ankles or MTPs.  CDAI Exam: CDAI Score: 0.4  Patient Global: 2 mm; Provider Global: 2 mm Swollen: 0 ; Tender: 0  Joint Exam 07/23/2021   No joint exam has been documented for this visit   There is currently no information documented on the homunculus. Go to the Rheumatology activity and complete the homunculus joint  exam.  Investigation: No additional findings.  Imaging: No results found.  Recent Labs: Lab Results  Component Value Date   WBC 4.5 03/12/2021   HGB 14.4 03/12/2021   PLT 275 03/12/2021   NA 141 03/12/2021   K 4.6 03/12/2021   CL 103 03/12/2021   CO2 29 03/12/2021   GLUCOSE 100 (H) 03/12/2021   BUN 21 03/12/2021   CREATININE 0.62 03/12/2021   BILITOT 0.5 03/12/2021   ALKPHOS 89 01/03/2019   AST 23 03/12/2021   ALT 33 (H) 03/12/2021   PROT 7.3 03/12/2021   ALBUMIN 4.7 01/03/2019   CALCIUM 10.0 03/12/2021   GFRAA 103 09/02/2020    Speciality Comments: PLQ Eye Exam: 08/01/2020 WNL @ [place] Follow up in 1 year.   Procedures:  No procedures performed Allergies: Latex, Celecoxib, Cephalexin, Codeine, Erythromycin, Sulfonamide derivatives, Trazodone and nefazodone, and Venlafaxine   Assessment / Plan:     Visit Diagnoses: Rheumatoid arthritis of multiple sites with negative rheumatoid factor ([place]) - RF-, +anti-CCP: She is doing well with no synovitis on examination today.  She  continues to have some morning stiffness and some discomfort in her hands which I believe is due to osteoarthritis.  High risk medication use - Plaquenil 200 mg 1 tablet by mouth twice daily Monday to Friday. PLQ Eye Exam: 08/01/2020.  Labs from September 2022.  Stable except for mildly elevated ALT.  We will repeat labs today.  She will get eye examination this month.  Information regarding immunization was placed in the AVS.  Positive anti-CCP test - Anti-CCP 39 (weak positive), RF negative, ESR 6.   Primary osteoarthritis of both hands-she has severe osteoarthritis in her hands with DIP and PIP thickening.  Joint protection and muscle strengthening was discussed.  Primary osteoarthritis of both feet-proper fitting shoes were discussed.  Status post total bilateral knee replacement - Dr. Berenice Primas: She had warmth on palpation of her knee joints without any swelling or effusion  Fibromyalgia-she continues to have some generalized pain and discomfort.  Need for regular exercise and good sleep hygiene was discussed.  For the medical problems listed as follows:  Depression, major, recurrent, in partial remission ([place])  Mixed hyperlipidemia-increased risk of heart disease respiratory arthritis was discussed.  Dietary modifications and exercise was discussed.  Essential hypertension-blood pressure was normal today.  History of prediabetes  OSA on CPAP  Vitamin D deficiency-last vitamin D on August 23, 2020 was normal.  Orders: Orders Placed This Encounter  Procedures   CBC with Differential/Platelet   COMPLETE METABOLIC PANEL WITH GFR   No orders of the defined types were placed in this encounter.   Follow-Up Instructions: Return in about 5 months (around 12/20/2021) for Rheumatoid arthritis.   Bo Merino, MD  Note - This record has been created using Editor, commissioning.  Chart creation errors have been sought, but may not always  have been located. Such creation errors do not  reflect on  the standard of medical care.

## 2021-07-15 ENCOUNTER — Other Ambulatory Visit: Payer: Self-pay

## 2021-07-15 ENCOUNTER — Ambulatory Visit: Payer: Medicare PPO | Admitting: Nurse Practitioner

## 2021-07-15 ENCOUNTER — Encounter: Payer: Self-pay | Admitting: Nurse Practitioner

## 2021-07-15 VITALS — BP 148/82 | HR 96 | Temp 97.7°F | Wt 199.8 lb

## 2021-07-15 DIAGNOSIS — J0100 Acute maxillary sinusitis, unspecified: Secondary | ICD-10-CM

## 2021-07-15 DIAGNOSIS — H109 Unspecified conjunctivitis: Secondary | ICD-10-CM | POA: Diagnosis not present

## 2021-07-15 MED ORDER — DOXYCYCLINE HYCLATE 100 MG PO CAPS: 100.0000 mg | ORAL_CAPSULE | Freq: Two times a day (BID) | ORAL | 0 refills | Status: DC

## 2021-07-15 MED ORDER — GENTAMICIN SULFATE 0.3 % OP SOLN: 1.0000 [drp] | Freq: Three times a day (TID) | OPHTHALMIC | 0 refills | Status: AC

## 2021-07-15 NOTE — Progress Notes (Signed)
Assessment and Plan:  [name] was seen today for acute visit.  Diagnoses and all orders for this visit:  Acute non-recurrent maxillary sinusitis -     doxycycline (VIBRAMYCIN) 100 MG capsule; Take 1 capsule (100 mg total) by mouth 2 (two) times daily. Push fluids Continue Vit D, Vit C and Zinc Mucinex bid for the next 5 days  Bacterial conjunctivitis of right eye -     gentamicin (GARAMYCIN) 0.3 % ophthalmic solution; Place 1 drop into the right eye 3 (three) times daily for 10 days.  Warm compress to right eye, wash compress between each use  Wash hands well  If eyelid becomes edematous, runs fever greater than 101 or not improving within the next 5 days notify the office      Further disposition pending results of labs. Discussed med's effects and SE's.   Over 30 minutes of exam, counseling, chart review, and critical decision making was performed.   Future Appointments  Date Time Provider [place]  07/23/2021  1:00 PM Bo Merino, MD [facility] None  09/03/2021 10:00 AM [facility] MM 2 [facility]  09/03/2021 10:30 AM [facility] DX DEXA 1 [facility]  09/15/2021 10:00 AM Unk Pinto, MD [facility] None  03/13/2022 10:00 AM Liane Comber, NP [facility] None    ------------------------------------------------------------------------------------------------------------------   HPI BP (!) 148/82    Pulse 96    Temp 97.7 F (36.5 C)    Wt 199 lb 12.8 oz (90.6 kg)    SpO2 97%    BMI 33.25 kg/m  65 y.o.female presents for Congestion with green drainage, productive cough of green mucus x 6 days. Denies nausea, vomiting, and fever. This morning  Past Medical History:  Diagnosis Date   Fibromyalgia    Hypertension    S/P total knee arthroplasty, left 01/07/2019   Simple renal cyst 12/12/2019   Incidentally noted on abd [place] 11/2019 - radiologist recommended further characterization with MRI w & w/o contrast   Hypoechoic lesions within the kidney which may  simply represent mildly complicated cysts. The largest of these lies in the midportion of the kidney with increased echogenicity within and suggestion of internal color flow which may be related to septation. 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   acyclovir (ZOVIRAX) 400 MG tablet Take 1 tablet (400 mg total) by mouth 3 (three) times daily as needed (fever blisters).   aspirin EC 81 MG tablet Take 81 mg by mouth daily.   Aspirin Effervescent (ALKA-SELTZER ORIGINAL PO) Take by mouth.   cetirizine (ZYRTEC) 10 MG tablet Take 10 mg by mouth daily.    Cholecalciferol (VITAMIN D PO) Take 8,000 Int'l Units by mouth daily.  2000 units per capsule   CINNAMON PO Take 2,000 mg by mouth daily.   clonazePAM (KLONOPIN) 1 MG tablet Take 1 mg by mouth at bedtime as needed for anxiety.    diclofenac Sodium (VOLTAREN) 1 % GEL Apply topically daily.   Ginger, Zingiber officinalis, (GINGER ROOT) 500 MG CAPS Take by mouth 3 (three) times daily.   hydrochlorothiazide (HYDRODIURIL) 25 MG tablet TAKE 1 TABLET BY MOUTH DAILY FOR BLOOD PRESSURE AND FLUID RETENTION OR ANKLE SWELLING   hydroxychloroquine (PLAQUENIL) 200 MG tablet TAKE 1 TABLET BY MOUTH TWICE DAILY ON MONDAY THROUGH FRIDAY ONLY   ibuprofen (ADVIL) 200 MG tablet Take 200 mg by mouth every 6 (six) hours as needed.   Lysine  500 MG TABS Take 1,000 mg by mouth daily. Currently taking 2 pill QD   Magnesium 500 MG TABS Take 2,500 mg by mouth daily. Takes 4 tablets daily   Multiple Vitamin (MULTIVITAMIN WITH MINERALS) TABS tablet Take 0.5 tablets by mouth 2 (two) times daily.   Phenyleph-Diphenhyd-DM-APAP (THERAFLU SEVERE COLD & COUGH PO) Take by mouth.   QUEtiapine (SEROQUEL) 100 MG tablet Take 100 mg by mouth 3 (three) times daily.   vitamin C (ASCORBIC ACID) 500 MG tablet Take 500 mg by mouth 2 (two) times daily.   Vitamin E 268 MG (400 UNIT) TABS Take 400 Units by mouth. 1200 units   zinc gluconate 50 MG tablet Take 50 mg by mouth daily.   glucose blood (ACCU-CHEK AVIVA PLUS) test strip CHECK BLOOD SUGAR 1 TIME DAILY. [id number]   lamoTRIgine (LAMICTAL) 150 MG tablet Take 1 & 1/2 tablets at bedtime (Patient taking differently: Take 1 tablet at bedtime)   Lancets (ACCU-CHEK MULTICLIX) lancets Use to check blood glucose daily   rosuvastatin (CRESTOR) 40 MG tablet Take 1 tablet Daily for Cholesterol   No current facility-administered medications on file prior to visit.    ROS: all negative except above.   Physical Exam:  BP (!) 148/82    Pulse 96    Temp 97.7 F (36.5 C)    Wt 199 lb 12.8 oz (90.6 kg)    SpO2 97%    BMI 33.25 kg/m   General Appearance: Well nourished, in no apparent distress. Eyes: PERRLA, EOMs, Right Conjunctiva erythematous with some swelling of eyelid, white/yellow mucus noted at medial canthus Sinuses: No Frontal/maxillary tenderness ENT/Mouth: Ext aud canals clear, Left TM erythematous and fluid noted . Right TM no erythema or bulging. No erythema, swelling, or exudate on post pharynx.  Tonsils not swollen or erythematous. Hearing normal.  Neck: Supple, thyroid normal.  Respiratory: Respiratory effort normal, BS equal bilaterally without rales, rhonchi, wheezing or stridor.  Cardio: RRR with no MRGs. Brisk peripheral pulses without edema.  Abdomen: Soft, + BS.  Non tender, no guarding,  rebound, hernias, masses. Lymphatics: Positive left cervical adenopathy Musculoskeletal: Full ROM, 5/5 strength, normal gait.  Skin: Warm, dry without rashes, lesions, ecchymosis.  Neuro: Cranial nerves intact. Normal muscle tone, no cerebellar symptoms. Sensation intact.  Psych: Awake and oriented X 3, normal affect, Insight and Judgment appropriate.     Magda Bernheim, NP 4:37 PM [facility]

## 2021-07-15 NOTE — Patient Instructions (Addendum)
Mucines or Mucinex DM use twice a day for 5 days  Sinusitis, Adult Sinusitis is inflammation of your sinuses. Sinuses are hollow spaces in the bones around your face. Your sinuses are located: Around your eyes. In the middle of your forehead. Behind your nose. In your cheekbones. Mucus normally drains out of your sinuses. When your nasal tissues become inflamed or swollen, mucus can become trapped or blocked. This allows bacteria, viruses, and fungi to grow, which leads to infection. Most infections of the sinuses are caused by a virus. Sinusitis can develop quickly. It can last for up to 4 weeks (acute) or for more than 12 weeks (chronic). Sinusitis often develops after a cold. What are the causes? This condition is caused by anything that creates swelling in the sinuses or stops mucus from draining. This includes: Allergies. Asthma. Infection from bacteria or viruses. Deformities or blockages in your nose or sinuses. Abnormal growths in the nose (nasal polyps). Pollutants, such as chemicals or irritants in the air. Infection from fungi (rare). What increases the risk? You are more likely to develop this condition if you: Have a weak body defense system (immune system). Do a lot of swimming or diving. Overuse nasal sprays. Smoke. What are the signs or symptoms? The main symptoms of this condition are pain and a feeling of pressure around the affected sinuses. Other symptoms include: Stuffy nose or congestion. Thick drainage from your nose. Swelling and warmth over the affected sinuses. Headache. Upper toothache. A cough that may get worse at night. Extra mucus that collects in the throat or the back of the nose (postnasal drip). Decreased sense of smell and taste. Fatigue. A fever. Sore throat. Bad breath. How is this diagnosed? This condition is diagnosed based on: Your symptoms. Your medical history. A physical exam. Tests to find out if your condition is acute or  chronic. This may include: Checking your nose for nasal polyps. Viewing your sinuses using a device that has a light (endoscope). Testing for allergies or bacteria. Imaging tests, such as an MRI or CT scan. In rare cases, a bone biopsy may be done to rule out more serious types of fungal sinus disease. How is this treated? Treatment for sinusitis depends on the cause and whether your condition is chronic or acute. If caused by a virus, your symptoms should go away on their own within 10 days. You may be given medicines to relieve symptoms. They include: Medicines that shrink swollen nasal passages (topical intranasal decongestants). Medicines that treat allergies (antihistamines). A spray that eases inflammation of the nostrils (topical intranasal corticosteroids). Rinses that help get rid of thick mucus in your nose (nasal saline washes). If caused by bacteria, your health care provider may recommend waiting to see if your symptoms improve. Most bacterial infections will get better without antibiotic medicine. You may be given antibiotics if you have: A severe infection. A weak immune system. If caused by narrow nasal passages or nasal polyps, you may need to have surgery. Follow these instructions at home: Medicines Take, use, or apply over-the-counter and prescription medicines only as told by your health care provider. These may include nasal sprays. If you were prescribed an antibiotic medicine, take it as told by your health care provider. Do not stop taking the antibiotic even if you start to feel better. Hydrate and humidify  Drink enough fluid to keep your urine pale yellow. Staying hydrated will help to thin your mucus. Use a cool mist humidifier to keep the humidity level  in your home above 50%. Inhale steam for 10-15 minutes, 3-4 times a day, or as told by your health care provider. You can do this in the bathroom while a hot shower is running. Limit your exposure to cool or dry  air. Rest Rest as much as possible. Sleep with your head raised (elevated). Make sure you get enough sleep each night. General instructions  Apply a warm, moist washcloth to your face 3-4 times a day or as told by your health care provider. This will help with discomfort. Wash your hands often with soap and water to reduce your exposure to germs. If soap and water are not available, use hand sanitizer. Do not smoke. Avoid being around people who are smoking (secondhand smoke). Keep all follow-up visits as told by your health care provider. This is important. Contact a health care provider if: You have a fever. Your symptoms get worse. Your symptoms do not improve within 10 days. Get help right away if: You have a severe headache. You have persistent vomiting. You have severe pain or swelling around your face or eyes. You have vision problems. You develop confusion. Your neck is stiff. You have trouble breathing. Summary Sinusitis is soreness and inflammation of your sinuses. Sinuses are hollow spaces in the bones around your face. This condition is caused by nasal tissues that become inflamed or swollen. The swelling traps or blocks the flow of mucus. This allows bacteria, viruses, and fungi to grow, which leads to infection. If you were prescribed an antibiotic medicine, take it as told by your health care provider. Do not stop taking the antibiotic even if you start to feel better. Keep all follow-up visits as told by your health care provider. This is important. This information is not intended to replace advice given to you by your health care provider. Make sure you discuss any questions you have with your health care provider. Document Revised: 11/08/2017 Document Reviewed: 11/08/2017 Elsevier Patient Education  2022 [employer].

## 2021-07-18 ENCOUNTER — Telehealth: Payer: Self-pay | Admitting: Rheumatology

## 2021-07-18 NOTE — Telephone Encounter (Signed)
Patient left a voicemail stating she was unsure if she needs labs before her next appointment or can she get them during. [phone]

## 2021-07-18 NOTE — Telephone Encounter (Signed)
Patient advised she may come prior to her appointment to have labs or she can have them done at the time of her appointment. Patient advised of lab hours if she chooses to come prior to her appointment.

## 2021-07-23 ENCOUNTER — Ambulatory Visit: Payer: Medicare PPO | Admitting: Rheumatology

## 2021-07-23 ENCOUNTER — Encounter: Payer: Self-pay | Admitting: Rheumatology

## 2021-07-23 ENCOUNTER — Other Ambulatory Visit: Payer: Self-pay

## 2021-07-23 VITALS — BP 128/82 | HR 91 | Resp 12 | Ht 64.0 in | Wt 199.0 lb

## 2021-07-23 DIAGNOSIS — I10 Essential (primary) hypertension: Secondary | ICD-10-CM

## 2021-07-23 DIAGNOSIS — M797 Fibromyalgia: Secondary | ICD-10-CM | POA: Diagnosis not present

## 2021-07-23 DIAGNOSIS — M0609 Rheumatoid arthritis without rheumatoid factor, multiple sites: Secondary | ICD-10-CM

## 2021-07-23 DIAGNOSIS — E782 Mixed hyperlipidemia: Secondary | ICD-10-CM | POA: Diagnosis not present

## 2021-07-23 DIAGNOSIS — M19042 Primary osteoarthritis, left hand: Secondary | ICD-10-CM

## 2021-07-23 DIAGNOSIS — Z96653 Presence of artificial knee joint, bilateral: Secondary | ICD-10-CM

## 2021-07-23 DIAGNOSIS — M19071 Primary osteoarthritis, right ankle and foot: Secondary | ICD-10-CM

## 2021-07-23 DIAGNOSIS — E559 Vitamin D deficiency, unspecified: Secondary | ICD-10-CM

## 2021-07-23 DIAGNOSIS — M19041 Primary osteoarthritis, right hand: Secondary | ICD-10-CM

## 2021-07-23 DIAGNOSIS — Z9989 Dependence on other enabling machines and devices: Secondary | ICD-10-CM

## 2021-07-23 DIAGNOSIS — G4733 Obstructive sleep apnea (adult) (pediatric): Secondary | ICD-10-CM

## 2021-07-23 DIAGNOSIS — F3341 Major depressive disorder, recurrent, in partial remission: Secondary | ICD-10-CM

## 2021-07-23 DIAGNOSIS — Z79899 Other long term (current) drug therapy: Secondary | ICD-10-CM

## 2021-07-23 DIAGNOSIS — Z87898 Personal history of other specified conditions: Secondary | ICD-10-CM

## 2021-07-23 DIAGNOSIS — R768 Other specified abnormal immunological findings in serum: Secondary | ICD-10-CM

## 2021-07-23 DIAGNOSIS — M19072 Primary osteoarthritis, left ankle and foot: Secondary | ICD-10-CM

## 2021-07-23 LAB — COMPLETE METABOLIC PANEL WITH GFR
AG Ratio: 1.8 (calc) (ref 1.0–2.5)
ALT: 45 U/L — ABNORMAL HIGH (ref 6–29)
AST: 33 U/L (ref 10–35)
Albumin: 4.6 g/dL (ref 3.6–5.1)
Alkaline phosphatase (APISO): 103 U/L (ref 37–153)
BUN: 17 mg/dL (ref 7–25)
CO2: 29 mmol/L (ref 20–32)
Calcium: 10.4 mg/dL (ref 8.6–10.4)
Chloride: 102 mmol/L (ref 98–110)
Creat: 0.68 mg/dL (ref 0.50–1.05)
Globulin: 2.6 g/dL (calc) (ref 1.9–3.7)
Glucose, Bld: 85 mg/dL (ref 65–99)
Potassium: 4.2 mmol/L (ref 3.5–5.3)
Sodium: 141 mmol/L (ref 135–146)
Total Bilirubin: 0.5 mg/dL (ref 0.2–1.2)
Total Protein: 7.2 g/dL (ref 6.1–8.1)
eGFR: 97 mL/min/{1.73_m2} (ref >=60)

## 2021-07-23 LAB — CBC WITH DIFFERENTIAL/PLATELET
Absolute Monocytes: 659 cells/uL (ref 200–950)
Basophils Absolute: 38 cells/uL (ref 0–200)
Basophils Relative: 0.6 %
Eosinophils Absolute: 192 cells/uL (ref 15–500)
Eosinophils Relative: 3 %
HCT: 42.9 % (ref 35.0–45.0)
Hemoglobin: 14.2 g/dL (ref 11.7–15.5)
Lymphs Abs: 2163 cells/uL (ref 850–3900)
MCH: 30.1 pg (ref 27.0–33.0)
MCHC: 33.1 g/dL (ref 32.0–36.0)
MCV: 91.1 fL (ref 80.0–100.0)
MPV: 10.6 fL (ref 7.5–12.5)
Monocytes Relative: 10.3 %
Neutro Abs: 3347 cells/uL (ref 1500–7800)
Neutrophils Relative %: 52.3 %
Platelets: 333 10*3/uL (ref 140–400)
RBC: 4.71 10*6/uL (ref 3.80–5.10)
RDW: 13 % (ref 11.0–15.0)
Total Lymphocyte: 33.8 %
WBC: 6.4 10*3/uL (ref 3.8–10.8)

## 2021-07-23 NOTE — Patient Instructions (Addendum)
Vaccines You are taking a medication(s) that can suppress your immune system.  The following immunizations are recommended: Flu annually Covid-19  Td/Tdap (tetanus, diphtheria, pertussis) every 10 years Pneumonia (Prevnar 15 then Pneumovax 23 at least 1 year apart.  Alternatively, can take Prevnar 20 without needing additional dose) Shingrix: 2 doses from 4 weeks to 6 months apart  Please check with your PCP to make sure you are up to date.   Heart Disease Prevention   Your inflammatory disease increases your risk of heart disease which includes heart attack, stroke, atrial fibrillation (irregular heartbeats), high blood pressure, heart failure and atherosclerosis (plaque in the arteries).  It is important to reduce your risk by:   Keep blood pressure, cholesterol, and blood sugar at healthy levels   Smoking Cessation   Maintain a healthy weight  BMI 20-25   Eat a healthy diet  Plenty of fresh fruit, vegetables, and whole grains  Limit saturated fats, foods high in sodium, and added sugars  DASH and Mediterranean diet   Increase physical activity  Recommend moderate physically activity for 150 minutes per week/ 30 minutes a day for five days a week These can be broken up into three separate ten-minute sessions during the day.   Reduce Stress  Meditation, slow breathing exercises, yoga, coloring books  Dental visits twice a year   

## 2021-07-24 ENCOUNTER — Telehealth: Payer: Self-pay | Admitting: Rheumatology

## 2021-07-24 NOTE — Telephone Encounter (Signed)
Patient called the office to return Andrea's call regarding labs.

## 2021-07-24 NOTE — Telephone Encounter (Signed)
See lab note for details.  

## 2021-09-03 ENCOUNTER — Ambulatory Visit
Admission: RE | Admit: 2021-09-03 | Discharge: 2021-09-03 | Disposition: A | Discharge status: Home / Self Care | Payer: Medicare PPO | Source: Ambulatory Visit | Attending: Adult Health | Admitting: Adult Health

## 2021-09-03 ENCOUNTER — Encounter: Payer: Self-pay | Admitting: Adult Health

## 2021-09-03 DIAGNOSIS — Z1231 Encounter for screening mammogram for malignant neoplasm of breast: Secondary | ICD-10-CM | POA: Diagnosis not present

## 2021-09-03 DIAGNOSIS — M8580 Other specified disorders of bone density and structure, unspecified site: Secondary | ICD-10-CM | POA: Insufficient documentation

## 2021-09-03 DIAGNOSIS — Z780 Asymptomatic menopausal state: Secondary | ICD-10-CM | POA: Diagnosis not present

## 2021-09-03 DIAGNOSIS — E2839 Other primary ovarian failure: Secondary | ICD-10-CM

## 2021-09-03 DIAGNOSIS — M85851 Other specified disorders of bone density and structure, right thigh: Secondary | ICD-10-CM | POA: Diagnosis not present

## 2021-09-09 ENCOUNTER — Encounter: Payer: Medicare PPO | Admitting: Internal Medicine

## 2021-09-14 NOTE — Progress Notes (Addendum)
? ?Annual Screening/Preventative Visit ?& Comprehensive Evaluation &  Examination ? ?Future Appointments  ?Date Time Provider Department  ?09/15/2021 10:00 AM Unk Pinto, MD [facility]  ?12/24/2021  9:20 AM Ofilia Neas, PA-C [facility]  ?03/13/2022 10:00 AM Liane Comber, NP [facility]  ?09/16/2022 10:00 AM Unk Pinto, MD [facility]  ? ? ?    This very nice 66 y.o. MWF presents for a Screening /Preventative Visit & comprehensive evaluation and management of multiple medical co-morbidities.  Patient has been followed for HTN, HLD, Prediabetes  and Vitamin D Deficiency. Patient has been on CPAP for OSA since 2004 and reports improved sleep hygiene. ? ?     ?    Patient also relates progressive prodrome of  increasing frequency of nonproductive dry coughing episodes and occasional hoarseness and admits occasional hoarseness. Also has occasional "acid" reflux  ? ? ?                                        [[ Patient has been on SS Disability since age 34 for hx/o Panic / Anxiety & Depression. ]] ? ?     HTN predates since  2004. Patient's BP has been controlled at home and patient denies any cardiac symptoms as chest pain, palpitations, shortness of breath, dizziness or ankle swelling. Today's BP: 136/84  ? ? ?    Patient's hyperlipidemia is controlled with diet and rosuvastatin. Patient denies myalgias or other medication SE's. Last lipids were at goal : ? ?Lab Results  ?Component Value Date  ? CHOL 141 03/12/2021  ? HDL 60 03/12/2021  ? [place] 59 03/12/2021  ? TRIG 136 03/12/2021  ? CHOLHDL 2.4 03/12/2021  ? ? ? ?    Patient has Morbid Obesity (BMI 33) and hx/o prediabetes (A1c 5.7% / 2010) and patient denies reactive hypoglycemic symptoms, visual blurring, diabetic polys or paresthesias. Last A1c was near goal : ? ?Lab Results  ?Component Value Date  ? HGBA1C 5.8 (H) 03/12/2021  ? ? ? ?    Finally, patient has history of Vitamin D Deficiency a ("23" /2008) nd last Vitamin D was at goal : ? ?Lab Results   ?Component Value Date  ? VD25OH 77 09/02/2020  ? ? ? ?Current Outpatient Medications on File Prior to Visit  ?Medication Sig  ? acetaminophen (TYLENOL) 500 MG tablet Take 500 mg by mouth every 6 (six) hours as needed.  ? acyclovir (ZOVIRAX) 400 MG tablet Take 1 tablet 3 times daily as needed (fever blisters).  ? aspirin EC 81 MG tablet Take  daily.  ? cetirizine 10 MG tablet Take 10 mg by mouth daily.   ? Vitamin D 2,000 u caps  Take 8,000 Int'l Units daily.   ? CINNAMON  2,000 mg Take  daily.  ? clonazePAM  1 MG tablet Take 1 mg at bedtime as needed for anxiety.   ? diclofenac 1 % GEL Apply topically daily.  ? hydrochlorothiazide 25 MG tablet TAKE 1 TABLET DAILY   ? hydroxychloroquine 200 MG tablet TAKE 1 TABLET TWICE DAILY ON MONDAY THROUGH FRIDAY ONLY  ? lamoTRIgine (LAMICTAL) 150 MG tablet Take 1 tablet at bedtime  ? Lysine 500 MG TABS Take 1,000 mg  daily  ? Magnesium 500 MG TABS Take 2,500 m daily. Takes 4 tablets daily  ? Multiple Vitamin  Take 0.5 tablets 2  times daily.  ? QUEtiapine 100 MG tablet Take  3 times daily.  ? rosuvastatin  40 MG tablet Take 1 tablet Daily   ? vitamin C 500 MG tablet Take 2  times daily.  ? zinc gluconate 50 MG tablet Take daily.  ? ? ? ?Allergies  ?Allergen Reactions  ? Latex   ?  Band aids= if left on for extended period of time  ? Celecoxib   ?  REACTION: Rash (celebrex)  ? Cephalexin   ?  REACTION: Rash (keflex)  ? Codeine   ?  REACTION: Rash  ? Erythromycin   ?  REACTION: Rash (emycin)  ? Sulfonamide Derivatives   ?  REACTION: Rash  ? Trazodone And Nefazodone   ?  insomnia  ? Venlafaxine   ?  REACTION: Blurred Vision Insurance claims handler)  ? ? ? ?Past Medical History:  ?Diagnosis Date  ? Fibromyalgia   ? Hypertension   ? S/P total knee arthroplasty, left 01/07/2019  ? Simple renal cyst 12/12/2019  ? Incidentally noted on abd [place] 11/2019 - radiologist recommended further characterization with MRI w & w/o contrast   Hypoechoic lesions within the kidney which may simply represent mildly  complicated cysts. The largest of these lies in the midportion of the kidney with increased echogenicity within and suggestion of internal color flow which may be related to septation. MRI of the kidneys with and w  ? Status post total shoulder arthroplasty, left 01/07/2019  ? Vitamin D deficiency   ? ? ? ?Health Maintenance  ?Topic Date Due  ? Zoster Vaccines- Shingrix (1 of 2) Never done  ? COVID-19 Vaccine (4 - Booster for Pfizer series) 10/08/2020  ? INFLUENZA VACCINE  01/20/2021  ? MAMMOGRAM  09/04/2022  ? DEXA SCAN  09/04/2023  ? TETANUS/TDAP  07/04/2025  ? COLONOSCOPY (Pts 45-94yr Insurance coverage will need to be confirmed)  12/02/2027  ? Hepatitis C Screening  Completed  ? HIV Screening  Completed  ? HPV VACCINES  Aged Out  ? PAP SMEAR-Modifier  Discontinued  ? ? ? ?Immunization History  ?Administered Date(s) Administered  ? DT (Pediatric) 07/05/2015  ? Influenza Inj Mdck Quad With Preservative 05/28/2017, 04/13/2018  ? Influenza Split 04/06/2012, 04/05/2013, 05/16/2014, 03/21/2015  ? Influenza,inj,Quad PF,6+ Mos 03/16/2019  ? Influenza,inj,quad, With Preservative 04/29/2016  ? PFIZER(Purple Top)SARS-COV-2 Vaccination 11/08/2019, 11/29/2019, 08/13/2020  ? Pneumococcal Conjugate-13 05/16/2014  ? Pneumococcal-Unspecified 06/22/1998  ? Td 06/22/2004  ? Zoster, Live 10/21/2015  ? ? ? ?Last Colon -  12/01/2017 - Dr JArdis Hughsrecc 10 yr f/u due June 2029 ? ? ?Last MGM - 09/03/2021 ? ? ?Past Surgical History:  ?Procedure Laterality Date  ? ABDOMINAL HYSTERECTOMY  2002  ? Dr. MBrien Mates  ? COLONOSCOPY  2009  ? JOINT REPLACEMENT    ? arthroscopy  ? KNEE ARTHROPLASTY    ? TOTAL KNEE ARTHROPLASTY Right 04/14/2013  ? RIGHT TOTAL KNEE ARTHROPLASTY AND LEFT KNEE INJECTION ;   JAlta Corning MD  ? TOTAL KNEE ARTHROPLASTY Left 01/06/2019  ? Procedure: TOTAL KNEE ARTHROPLASTY;  Surgeon: GDorna Leitz M  ? ? ? ?Family History  ?Problem Relation Age of Onset  ? Hypertension Mother   ? Heart attack Mother   ? Hypertension Father   ?  COPD Father   ? Cancer Father   ?     thyroid  ? CVA Maternal Grandmother 693 ? Heart attack Maternal Grandfather 592 ? Deep vein thrombosis Maternal Grandfather   ? Colon cancer Neg Hx   ? Esophageal cancer Neg Hx   ?  Stomach cancer Neg Hx   ? ? ? ?Social History  ? ?Tobacco Use  ? Smoking status: Never  ? Smokeless tobacco: Never  ?Vaping Use  ? Vaping Use: Never used  ?Substance Use Topics  ? Alcohol use: No  ? Drug use: No  ? ? ? ? ROS ?Constitutional: Denies fever, chills, weight loss/gain, headaches, insomnia,  night sweats, and change in appetite. Does c/o fatigue. ?Eyes: Denies redness, blurred vision, diplopia, discharge, itchy, watery eyes.  ?ENT: Denies discharge, congestion, post nasal drip, epistaxis, sore throat, earache, hearing loss, dental pain, Tinnitus, Vertigo, Sinus pain, snoring.  ?Cardio: Denies chest pain, palpitations, irregular heartbeat, syncope, dyspnea, diaphoresis, orthopnea, PND, claudication, edema ?Respiratory: denies cough, dyspnea, DOE, pleurisy, hoarseness, laryngitis, wheezing.  ?Gastrointestinal: Denies dysphagia, heartburn, reflux, water brash, pain, cramps, nausea, vomiting, bloating, diarrhea, constipation, hematemesis, melena, hematochezia, jaundice, hemorrhoids ?Genitourinary: Denies dysuria, frequency, urgency, nocturia, hesitancy, discharge, hematuria, flank pain ?Breast: Breast lumps, nipple discharge, bleeding.  ?Musculoskeletal: Denies arthralgia, myalgia, stiffness, Jt. Swelling, pain, limp, and strain/sprain. Denies falls. ?Skin: Denies puritis, rash, hives, warts, acne, eczema, changing in skin lesion ?Neuro: No weakness, tremor, incoordination, spasms, paresthesia, pain ?Psychiatric: Denies confusion, memory loss, sensory loss. Denies Depression. ?Endocrine: Denies change in weight, skin, hair change, nocturia, and paresthesia, diabetic polys, visual blurring, hyper / hypo glycemic episodes.  ?Heme/Lymph: No excessive bleeding, bruising, enlarged lymph  nodes. ? ?Physical Exam ? ?BP 136/84   Pulse 91   Temp 97.9 ?F (36.6 ?C)   Resp 17   Ht '5\' 4"'$  (1.626 m)   Wt 197 lb (89.4 kg)   SpO2 96%   BMI 33.81 kg/m?  ? ?Occasional spastic tussive paroxysmx ? ?\ ?General Appearance:

## 2021-09-15 ENCOUNTER — Other Ambulatory Visit: Payer: Self-pay

## 2021-09-15 ENCOUNTER — Ambulatory Visit
Admission: RE | Admit: 2021-09-15 | Discharge: 2021-09-15 | Disposition: A | Discharge status: Home / Self Care | Payer: Medicare PPO | Source: Ambulatory Visit | Attending: Internal Medicine | Admitting: Internal Medicine

## 2021-09-15 ENCOUNTER — Encounter: Payer: Self-pay | Admitting: Internal Medicine

## 2021-09-15 ENCOUNTER — Ambulatory Visit (INDEPENDENT_AMBULATORY_CARE_PROVIDER_SITE_OTHER): Payer: Medicare PPO | Admitting: Internal Medicine

## 2021-09-15 VITALS — BP 136/84 | HR 91 | Temp 97.9°F | Resp 17 | Ht 64.0 in | Wt 197.0 lb

## 2021-09-15 DIAGNOSIS — E782 Mixed hyperlipidemia: Secondary | ICD-10-CM

## 2021-09-15 DIAGNOSIS — R052 Subacute cough: Secondary | ICD-10-CM

## 2021-09-15 DIAGNOSIS — Z79899 Other long term (current) drug therapy: Secondary | ICD-10-CM

## 2021-09-15 DIAGNOSIS — Z0000 Encounter for general adult medical examination without abnormal findings: Secondary | ICD-10-CM | POA: Diagnosis not present

## 2021-09-15 DIAGNOSIS — E559 Vitamin D deficiency, unspecified: Secondary | ICD-10-CM

## 2021-09-15 DIAGNOSIS — R059 Cough, unspecified: Secondary | ICD-10-CM | POA: Diagnosis not present

## 2021-09-15 DIAGNOSIS — R7309 Other abnormal glucose: Secondary | ICD-10-CM | POA: Diagnosis not present

## 2021-09-15 DIAGNOSIS — Z8249 Family history of ischemic heart disease and other diseases of the circulatory system: Secondary | ICD-10-CM

## 2021-09-15 DIAGNOSIS — I10 Essential (primary) hypertension: Secondary | ICD-10-CM

## 2021-09-15 DIAGNOSIS — Z136 Encounter for screening for cardiovascular disorders: Secondary | ICD-10-CM

## 2021-09-15 DIAGNOSIS — Z1212 Encounter for screening for malignant neoplasm of rectum: Secondary | ICD-10-CM

## 2021-09-15 DIAGNOSIS — G4733 Obstructive sleep apnea (adult) (pediatric): Secondary | ICD-10-CM

## 2021-09-15 DIAGNOSIS — Z0001 Encounter for general adult medical examination with abnormal findings: Secondary | ICD-10-CM

## 2021-09-15 MED ORDER — DEXAMETHASONE 4 MG PO TABS: ORAL_TABLET | ORAL | 0 refills | Status: DC

## 2021-09-15 MED ORDER — ESOMEPRAZOLE MAGNESIUM 40 MG PO CPDR: DELAYED_RELEASE_CAPSULE | ORAL | 1 refills | Status: DC

## 2021-09-15 NOTE — Patient Instructions (Signed)

## 2021-09-16 LAB — CBC WITH DIFFERENTIAL/PLATELET
Absolute Monocytes: 379 cells/uL (ref 200–950)
Basophils Absolute: 29 cells/uL (ref 0–200)
Basophils Relative: 0.6 %
Eosinophils Absolute: 130 cells/uL (ref 15–500)
Eosinophils Relative: 2.7 %
HCT: 42.3 % (ref 35.0–45.0)
Hemoglobin: 13.9 g/dL (ref 11.7–15.5)
Lymphs Abs: 1478 cells/uL (ref 850–3900)
MCH: 30.2 pg (ref 27.0–33.0)
MCHC: 32.9 g/dL (ref 32.0–36.0)
MCV: 92 fL (ref 80.0–100.0)
MPV: 11.3 fL (ref 7.5–12.5)
Monocytes Relative: 7.9 %
Neutro Abs: 2784 cells/uL (ref 1500–7800)
Neutrophils Relative %: 58 %
Platelets: 288 10*3/uL (ref 140–400)
RBC: 4.6 10*6/uL (ref 3.80–5.10)
RDW: 12.8 % (ref 11.0–15.0)
Total Lymphocyte: 30.8 %
WBC: 4.8 10*3/uL (ref 3.8–10.8)

## 2021-09-16 LAB — COMPLETE METABOLIC PANEL WITH GFR
AG Ratio: 1.7 (calc) (ref 1.0–2.5)
ALT: 26 U/L (ref 6–29)
AST: 24 U/L (ref 10–35)
Albumin: 4.6 g/dL (ref 3.6–5.1)
Alkaline phosphatase (APISO): 90 U/L (ref 37–153)
BUN: 13 mg/dL (ref 7–25)
CO2: 28 mmol/L (ref 20–32)
Calcium: 9.8 mg/dL (ref 8.6–10.4)
Chloride: 103 mmol/L (ref 98–110)
Creat: 0.67 mg/dL (ref 0.50–1.05)
Globulin: 2.7 g/dL (calc) (ref 1.9–3.7)
Glucose, Bld: 87 mg/dL (ref 65–99)
Potassium: 3.8 mmol/L (ref 3.5–5.3)
Sodium: 141 mmol/L (ref 135–146)
Total Bilirubin: 0.6 mg/dL (ref 0.2–1.2)
Total Protein: 7.3 g/dL (ref 6.1–8.1)
eGFR: 98 mL/min/{1.73_m2} (ref >=60)

## 2021-09-16 LAB — INSULIN, RANDOM: Insulin: 31.7 u[IU]/mL — ABNORMAL HIGH

## 2021-09-16 LAB — LIPID PANEL
Cholesterol: 123 mg/dL (ref <200)
HDL: 44 mg/dL — ABNORMAL LOW (ref >=50)
LDL Cholesterol (Calc): 54 mg/dL (calc)
Non-HDL Cholesterol (Calc): 79 mg/dL (calc) (ref <130)
Total CHOL/HDL Ratio: 2.8 (calc) (ref <5.0)
Triglycerides: 167 mg/dL — ABNORMAL HIGH (ref <150)

## 2021-09-16 LAB — MICROALBUMIN / CREATININE URINE RATIO
Creatinine, Urine: 61 mg/dL (ref 20–275)
Microalb, Ur: <0.2 mg/dL

## 2021-09-16 LAB — HEMOGLOBIN A1C
Hgb A1c MFr Bld: 5.9 % of total Hgb — ABNORMAL HIGH (ref <5.7)
Mean Plasma Glucose: 123 mg/dL
eAG (mmol/L): 6.8 mmol/L

## 2021-09-16 LAB — URINALYSIS, ROUTINE W REFLEX MICROSCOPIC
Bilirubin Urine: NEGATIVE
Glucose, UA: NEGATIVE
Hgb urine dipstick: NEGATIVE
Ketones, ur: NEGATIVE
Leukocytes,Ua: NEGATIVE
Nitrite: NEGATIVE
Protein, ur: NEGATIVE
Specific Gravity, Urine: 1.013 (ref 1.001–1.035)
pH: 8 (ref 5.0–8.0)

## 2021-09-16 LAB — VITAMIN D 25 HYDROXY (VIT D DEFICIENCY, FRACTURES): Vit D, 25-Hydroxy: 70 ng/mL (ref 30–100)

## 2021-09-16 LAB — TSH: TSH: 1.42 mIU/L (ref 0.40–4.50)

## 2021-09-16 LAB — MAGNESIUM: Magnesium: 2.1 mg/dL (ref 1.5–2.5)

## 2021-09-16 MED ORDER — ROSUVASTATIN CALCIUM 40 MG PO TABS: ORAL_TABLET | ORAL | 3 refills | Status: DC

## 2021-09-16 NOTE — Progress Notes (Signed)
<><><><><><><><><><><><><><><><><><><><><><><><><><><><><><><><><> ?<><><><><><><><><><><><><><><><><><><><><><><><><><><><><><><><><> ? ?-    CXR returned  Normal  ?                           - No sign of infection, heart failure  or cancer to explain cough.  ? ?<><><><><><><><><><><><><><><><><><><><><><><><><><><><><><><><><> ?<><><><><><><><><><><><><><><><><><><><><><><><><><><><><><><><><> ?

## 2021-09-16 NOTE — Addendum Note (Signed)
Addended by: Unk Pinto on: 09/16/2021 01:31 AM ? ? Modules accepted: Orders ? ?

## 2021-09-16 NOTE — Progress Notes (Signed)
<><><><><><><><><><><><><><><><><><><><><><><><><><><><><><><><><> ?<><><><><><><><><><><><><><><><><><><><><><><><><><><><><><><><><> ?-   Test results slightly outside the reference range are not unusual. ?If there is anything important, I will review this with you,  ?otherwise it is considered normal test values.  ?If you have further questions,  ?please do not hesitate to contact me at the office or via My Chart.  ?<><><><><><><><><><><><><><><><><><><><><><><><><><><><><><><><><> ?<><><><><><><><><><><><><><><><><><><><><><><><><><><><><><><><><> ? ?-  Total Chol = 123   &   LDL Chol = 54   - Both  Excellent  ? ?- Very low risk for Heart Attack  / Stroke ?<><><><><><><><><><><><><><><><><><><><><><><><><><><><><><><><><> ?<><><><><><><><><><><><><><><><><><><><><><><><><><><><><><><><><> ? ?-  A1c = 5.9%  ,    Still elevated -  Sugars too high   ? ? ?- Avoid Sweets, Candy & White Stuff  ? ?- White Rice, White Potatoes, White Flour  - Breads &  Pasta ?<><><><><><><><><><><><><><><><><><><><><><><><><><><><><><><><><> ?<><><><><><><><><><><><><><><><><><><><><><><><><><><><><><><><><> ? ?-  Vitamin D = 70  - Excellent  ?<><><><><><><><><><><><><><><><><><><><><><><><><><><><><><><><><> ?<><><><><><><><><><><><><><><><><><><><><><><><><><><><><><><><><> ? ?-  All Else - CBC - Kidneys - Electrolytes - Liver - Magnesium & Thyroid   ? ?- all  Normal / OK ? ?<><><><><><><><><><><><><><><><><><><><><><><><><><><><><><><><><> ?<><><><><><><><><><><><><><><><><><><><><><><><><><><><><><><><><> ? ? ? ? ? ? ? ? ? ? ? ? ? ? ? ? ? ? ? ? ? ? ? ? ? ? ?

## 2021-09-29 ENCOUNTER — Other Ambulatory Visit: Payer: Self-pay

## 2021-09-29 DIAGNOSIS — Z1211 Encounter for screening for malignant neoplasm of colon: Secondary | ICD-10-CM | POA: Diagnosis not present

## 2021-09-29 DIAGNOSIS — Z1212 Encounter for screening for malignant neoplasm of rectum: Secondary | ICD-10-CM | POA: Diagnosis not present

## 2021-09-29 LAB — POC HEMOCCULT BLD/STL (HOME/3-CARD/SCREEN)
Card #2 Fecal Occult Blod, POC: NEGATIVE
Card #3 Fecal Occult Blood, POC: NEGATIVE
Fecal Occult Blood, POC: NEGATIVE

## 2021-10-01 ENCOUNTER — Other Ambulatory Visit: Payer: Self-pay | Admitting: Internal Medicine

## 2021-10-07 DIAGNOSIS — H52223 Regular astigmatism, bilateral: Secondary | ICD-10-CM | POA: Diagnosis not present

## 2021-10-07 DIAGNOSIS — E7800 Pure hypercholesterolemia, unspecified: Secondary | ICD-10-CM | POA: Diagnosis not present

## 2021-10-07 DIAGNOSIS — H524 Presbyopia: Secondary | ICD-10-CM | POA: Diagnosis not present

## 2021-10-07 DIAGNOSIS — E0590 Thyrotoxicosis, unspecified without thyrotoxic crisis or storm: Secondary | ICD-10-CM | POA: Diagnosis not present

## 2021-10-07 DIAGNOSIS — H2513 Age-related nuclear cataract, bilateral: Secondary | ICD-10-CM | POA: Diagnosis not present

## 2021-10-07 DIAGNOSIS — Z79899 Other long term (current) drug therapy: Secondary | ICD-10-CM | POA: Diagnosis not present

## 2021-10-07 DIAGNOSIS — H5213 Myopia, bilateral: Secondary | ICD-10-CM | POA: Diagnosis not present

## 2021-10-07 DIAGNOSIS — M069 Rheumatoid arthritis, unspecified: Secondary | ICD-10-CM | POA: Diagnosis not present

## 2021-10-07 DIAGNOSIS — I10 Essential (primary) hypertension: Secondary | ICD-10-CM | POA: Diagnosis not present

## 2021-10-31 ENCOUNTER — Other Ambulatory Visit: Payer: Self-pay | Admitting: *Deleted

## 2021-10-31 DIAGNOSIS — M0609 Rheumatoid arthritis without rheumatoid factor, multiple sites: Secondary | ICD-10-CM

## 2021-10-31 MED ORDER — HYDROXYCHLOROQUINE SULFATE 200 MG PO TABS: ORAL_TABLET | ORAL | 0 refills | Status: DC

## 2021-10-31 NOTE — Telephone Encounter (Signed)
Next Visit: 12/24/2021 ? ?Last Visit: 07/23/2021 ? ?Labs: 08/21/2021, WNL ? ?Eye exam: 08/01/2020  Dr. Ammie Ferrier office will fax PLQ Eye exam form 09/2021 ? ?Current Dose per office note 07/23/2021: Plaquenil 200 mg 1 tablet by mouth twice daily Monday to Friday. ? ?JP:[id number] arthritis of multiple sites with negative rheumatoid factor  ? ?Last Fill: 05/19/2021 ? ?Okay to refill Plaquenil? ? ?

## 2021-12-03 DIAGNOSIS — F3132 Bipolar disorder, current episode depressed, moderate: Secondary | ICD-10-CM | POA: Diagnosis not present

## 2021-12-10 NOTE — Progress Notes (Signed)
Office Visit Note  Patient: [name]             Date of Birth: 17-Sep-1955           MRN: [id number]             PCP: Unk Pinto, MD Referring: Unk Pinto, MD Visit Date: 12/24/2021 Occupation: _0 @  Subjective:  Generalized pain   History of Present Illness: [name] is a 66 y.o. female  With history of rheumatoid arthritis, osteoarthritis, and fibromyalgia.  Patient remains on Plaquenil 200 mg 1 tablet by mouth twice daily Monday through Friday.  She is tolerating Plaquenil without any side effects and has not missed any doses recently.  She had an updated Plaquenil eye examination in April 2023 which was normal.  She states that she has had increased pain in both hands which she attributes to performing more repetitive and overuse activities at home.  She has noticed some swelling in her right hand.  She experiences intermittent myalgias and muscle tenderness due to fibromyalgia.  During flares she experiences profound fatigue and has to rest and sleep to alleviate her symptoms. She denies any other new questions or concerns.    Activities of Daily Living:  Patient reports morning stiffness for 10-15 minutes.   Patient Reports nocturnal pain.  Difficulty dressing/grooming: Denies Difficulty climbing stairs: Denies Difficulty getting out of chair: Reports Difficulty using hands for taps, buttons, cutlery, and/or writing: Reports  Review of Systems  Constitutional:  Positive for fatigue.  HENT:  Negative for mouth sores, mouth dryness and nose dryness.   Eyes:  Positive for dryness. Negative for pain and itching.  Respiratory:  Negative for shortness of breath and difficulty breathing.   Cardiovascular:  Negative for chest pain and palpitations.  Gastrointestinal:  Negative for blood in stool, constipation and diarrhea.  Endocrine: Negative for increased urination.  Genitourinary:  Negative for difficulty urinating.  Musculoskeletal:  Positive for  joint pain, joint pain, joint swelling and morning stiffness. Negative for myalgias, muscle tenderness and myalgias.  Skin:  Negative for color change, rash and redness.  Allergic/Immunologic: Negative for susceptible to infections.  Neurological:  Positive for numbness and headaches. Negative for dizziness, memory loss and weakness.  Hematological:  Positive for bruising/bleeding tendency.  Psychiatric/Behavioral:  Negative for confusion.     PMFS History:  Patient Active Problem List   Diagnosis Date Noted   Osteopenia 09/03/2021   Seronegative rheumatoid arthritis ([place]) 03/12/2021   Hyperlipidemia 03/11/2021   Fatty liver 12/12/2019   Other abnormal glucose (prediabetes) 05/23/2019   Osteoarthritis of both knees 03/20/2015   OSA on CPAP 08/15/2014   Fibromyalgia 01/31/2014   Depression, major, recurrent, in partial remission ([place]) 01/31/2014   Morbid obesity ([place]) - BMI 30+ with OSA 01/31/2014   Essential hypertension 08/09/2013   Vitamin D deficiency 08/09/2013    Past Medical History:  Diagnosis Date   Fibromyalgia    Hypertension    S/P total knee arthroplasty, left 01/07/2019   Simple renal cyst 12/12/2019   Incidentally noted on abd [place] 11/2019 - radiologist recommended further characterization with MRI w & w/o contrast   Hypoechoic lesions within the kidney which may simply represent mildly complicated cysts. The largest of these lies in the midportion of the kidney with increased echogenicity within and suggestion of internal color flow which may be related to septation. MRI of the kidneys with and w   Status post total shoulder arthroplasty, left 01/07/2019   Vitamin D  deficiency     Family History  Problem Relation Age of Onset   Hypertension Mother    Heart attack Mother    Hypertension Father    COPD Father    Cancer Father        thyroid   CVA Maternal Grandmother 64   Heart attack Maternal Grandfather 28   Deep vein thrombosis Maternal Grandfather    Colon  cancer Neg Hx    Esophageal cancer Neg Hx    Stomach cancer Neg Hx    Past Surgical History:  Procedure Laterality Date   ABDOMINAL HYSTERECTOMY  2002   Dr. Brien Mates    COLONOSCOPY  2009   JOINT REPLACEMENT     arthroscopy   KNEE ARTHROPLASTY     TOTAL KNEE ARTHROPLASTY Right 04/14/2013   Procedure: RIGHT TOTAL KNEE ARTHROPLASTY AND LEFT KNEE INJECTION ;  Surgeon: Alta Corning, MD;  Location: [facility];  Service: Orthopedics;  Laterality: Right;   TOTAL KNEE ARTHROPLASTY Left 01/06/2019   Procedure: TOTAL KNEE ARTHROPLASTY;  Surgeon: Dorna Leitz, MD;  Location: [facility];  Service: Orthopedics;  Laterality: Left;   Social History   Social History Narrative   Not on file   Immunization History  Administered Date(s) Administered   DT (Pediatric) 07/05/2015   Influenza Inj Mdck Quad With Preservative 05/28/2017, 04/13/2018   Influenza Split 04/06/2012, 04/05/2013, 05/16/2014, 03/21/2015   Influenza,inj,Quad PF,6+ Mos 03/16/2019   Influenza,inj,quad, With Preservative 04/29/2016   PFIZER(Purple Top)SARS-COV-2 Vaccination 11/08/2019, 11/29/2019, 08/13/2020   Pneumococcal Conjugate-13 05/16/2014   Pneumococcal-Unspecified 06/22/1998   Td 06/22/2004   Zoster, Live 10/21/2015     Objective: Vital Signs: BP (!) 146/77 (BP Location: Left Arm, Patient Position: Sitting, Cuff Size: Normal)   Pulse 86   Ht _0  (1.626 m)   Wt 199 lb (90.3 kg)   BMI 34.16 kg/m    Physical Exam Vitals and nursing note reviewed.  Constitutional:      Appearance: She is well-developed.  HENT:     Head: Normocephalic and atraumatic.  Eyes:     Conjunctiva/sclera: Conjunctivae normal.  Cardiovascular:     Rate and Rhythm: Normal rate and regular rhythm.     Heart sounds: Normal heart sounds.  Pulmonary:     Effort: Pulmonary effort is normal.     Breath sounds: Normal breath sounds.  Abdominal:     General: Bowel sounds are normal.     Palpations: Abdomen is soft.  Musculoskeletal:     Cervical  back: Normal range of motion.  Skin:    General: Skin is warm and dry.     Capillary Refill: Capillary refill takes less than 2 seconds.  Neurological:     Mental Status: She is alert and oriented to person, place, and time.  Psychiatric:        Behavior: Behavior normal.      Musculoskeletal Exam: Generalized hyperalgesia and positive tender points on examination today.  C-spine has slightly limited range of motion with lateral rotation.  Trapezius muscle tension and tenderness bilaterally.  Shoulder joints, elbow joints, wrist joints, MCPs, PIPs, DIPs have good range of motion with no synovitis.  Complete fist formation bilaterally.  PIP and DIP thickening consistent with osteoarthritis of both hands.  Hip joints have good range of motion with no groin pain. Bilateral knee replacements have good ROM with warmth but no effusion. Ankle joints have good ROM with no tenderness or joint swelling.  Dorsal spur, right foot noted.   CDAI Exam: CDAI  Score: 0.8  Patient Global: 4 mm; Provider Global: 4 mm Swollen: 0 ; Tender: 0  Joint Exam 12/24/2021   No joint exam has been documented for this visit   There is currently no information documented on the homunculus. Go to the Rheumatology activity and complete the homunculus joint exam.  Investigation: No additional findings.  Imaging: No results found.  Recent Labs: Lab Results  Component Value Date   WBC 4.8 12/17/2021   HGB 14.0 12/17/2021   PLT 257 12/17/2021   NA 143 12/17/2021   K 4.5 12/17/2021   CL 106 12/17/2021   CO2 29 12/17/2021   GLUCOSE 97 12/17/2021   BUN 15 12/17/2021   CREATININE 0.75 12/17/2021   BILITOT 0.5 12/17/2021   ALKPHOS 89 01/03/2019   AST 20 12/17/2021   ALT 24 12/17/2021   PROT 6.9 12/17/2021   ALBUMIN 4.7 01/03/2019   CALCIUM 9.6 12/17/2021   GFRAA 103 09/02/2020    Speciality Comments: PLQ Eye Exam: 10/07/2021 WNL @ [facility] Follow up in 1 year.   Dr. Sabra Heck  10/07/2021 will fax PLQ Eye Exam  Procedures:  No procedures performed Allergies: Latex, Celecoxib, Cephalexin, Codeine, Erythromycin, Sulfonamide derivatives, Trazodone and nefazodone, and Venlafaxine   Assessment / Plan:     Visit Diagnoses: Rheumatoid arthritis of multiple sites with negative rheumatoid factor ([place]) - RF-, +anti-CCP: She has no synovitis on examination today.  Her rheumatoid arthritis appears to be well controlled taking Plaquenil 200 mg 1 tablet by mouth twice daily Monday through Friday.  She experiences intermittent pain and stiffness in both hands due to underlying osteoarthritis.  She has PIP and DIP thickening consistent with osteoarthritic changes.  Discussed the importance of joint protection and muscle strengthening.  She was encouraged to perform hand exercises daily.  She will remain on the current dose of Plaquenil as prescribed.  She does not need a refill at this time.  She was advised to notify us if she develops signs or symptoms of a flare.  She will follow-up in the office in 5 months or sooner if needed.  High risk medication use - Plaquenil 200 mg 1 tablet by mouth twice daily Monday to Friday.  She continues to tolerate Plaquenil without any side effects.  PLQ Eye Exam: 10/07/2021 WNL @ [facility] Follow up in 1 year.  CBC and CMP within normal limits on 12/17/2021.  She will continue to require updated lab work every 5 months to monitor for drug toxicity. She has not had any recent infections.  Positive anti-CCP test - Anti-CCP 39 (weak positive), RF negative, ESR 6.  Primary osteoarthritis of both hands: She has PIP and DIP thickening consistent with osteoarthritis of both hands.  CMC joint prominence noted bilaterally.  Complete fist formation bilaterally.  No tenderness or synovitis over MCP joints.  She experiences intermittent pain and stiffness in both hands which is typically aggravated by overuse or repetitive activities.   Discussed the importance of joint protection and muscle strengthening.  Discussed importance of performing daily hand exercises.  She was advised to notify us if her symptoms persist or worsen.  Primary osteoarthritis of both feet: Dorsal spur, right foot.  No tenderness over MTP joints.  Ankle joints have good range of motion with no tenderness or synovitis.  Status post total bilateral knee replacement - Dr. Berenice Primas.  Overall doing well.  She has good range of motion of both knee replacements.  Warmth but no effusion was noted.  Discussed the importance of lower extremity muscle strengthening.  Fibromyalgia: She experiences intermittent myalgias and muscle tenderness due to fibromyalgia.  She has generalized hyperalgesia and positive tender points on examination today.  Discussed the importance of regular exercise and good sleep hygiene.  I also discussed the importance of stress management and taking breaks throughout the day.  I offered a referral to water therapy but she would like to hold off at this time.  Other medical conditions are listed as follows:   Essential hypertension  Mixed hyperlipidemia  History of prediabetes  Depression, major, recurrent, in partial remission ([place])  OSA on CPAP  Vitamin D deficiency  Orders: No orders of the defined types were placed in this encounter.  No orders of the defined types were placed in this encounter.   Follow-Up Instructions: Return in about 5 months (around 05/26/2022) for Rheumatoid arthritis, Osteoarthritis, Fibromyalgia.   Ofilia Neas, PA-C  Note - This record has been created using Dragon software.  Chart creation errors have been sought, but may not always  have been located. Such creation errors do not reflect on  the standard of medical care.

## 2021-12-17 ENCOUNTER — Ambulatory Visit: Payer: Medicare PPO | Admitting: Nurse Practitioner

## 2021-12-17 ENCOUNTER — Encounter: Payer: Self-pay | Admitting: Nurse Practitioner

## 2021-12-17 VITALS — BP 112/78 | HR 76 | Temp 96.9°F | Wt 199.2 lb

## 2021-12-17 DIAGNOSIS — G4733 Obstructive sleep apnea (adult) (pediatric): Secondary | ICD-10-CM

## 2021-12-17 DIAGNOSIS — E782 Mixed hyperlipidemia: Secondary | ICD-10-CM | POA: Diagnosis not present

## 2021-12-17 DIAGNOSIS — M0600 Rheumatoid arthritis without rheumatoid factor, unspecified site: Secondary | ICD-10-CM

## 2021-12-17 DIAGNOSIS — E559 Vitamin D deficiency, unspecified: Secondary | ICD-10-CM | POA: Diagnosis not present

## 2021-12-17 DIAGNOSIS — R7309 Other abnormal glucose: Secondary | ICD-10-CM | POA: Diagnosis not present

## 2021-12-17 DIAGNOSIS — M797 Fibromyalgia: Secondary | ICD-10-CM | POA: Diagnosis not present

## 2021-12-17 DIAGNOSIS — Z79899 Other long term (current) drug therapy: Secondary | ICD-10-CM | POA: Diagnosis not present

## 2021-12-17 DIAGNOSIS — F3341 Major depressive disorder, recurrent, in partial remission: Secondary | ICD-10-CM | POA: Diagnosis not present

## 2021-12-17 DIAGNOSIS — K760 Fatty (change of) liver, not elsewhere classified: Secondary | ICD-10-CM | POA: Diagnosis not present

## 2021-12-17 DIAGNOSIS — I10 Essential (primary) hypertension: Secondary | ICD-10-CM | POA: Diagnosis not present

## 2021-12-17 NOTE — Patient Instructions (Signed)

## 2021-12-17 NOTE — Progress Notes (Signed)
FOLLOW UP  Assessment and Plan:   1. Essential hypertension Controlled  Continue medications;  Discussed DASH (Dietary Approaches to Stop Hypertension) DASH diet is lower in sodium than a typical American diet. Cut back on foods that are high in saturated fat, cholesterol, and trans fats. Eat more whole-grain foods, fish, poultry, and nuts Remain active and exercise as tolerated daily.  Monitor BP at home-Call if greater than 130/80.    2. Mixed hyperlipidemia Controlled Continue medications;  Discussed lifestyle modifications. Recommended diet heavy in fruits and veggies, omega 3's. Decrease consumption of animal meats, cheeses, and dairy products. Remain active and exercise as tolerated. Continue to monitor.   3. Seronegative rheumatoid arthritis ([place]) Continue Hydroxychloroquine/Plaquenil Follows with [facility].  4. Depression, major, recurrent, in partial remission ([place]) Controlled. Continue medications.  5. OSA on CPAP Well managed. Continue CPAP  6. Fibromyalgia Manage pain  Continue tmt for depression. Continue to monitor   7. Fatty liver Monitor LFTs.   8. Vitamin D deficiency At Goal. Continue Supplement  9. Other abnormal glucose (prediabetes) Monitor A1C. Incorporate lifestyle modifications. Limit intake of carbohydrates, sugars, starches  10. Morbid obesity ([place]) - BMI 30+ with OSA Discussed appropriate BMI Goal of losing 1 lb per month. Diet modification. Physical activity. Encouraged/praised to build confidence.   11. Medication management All medications discussed and reviewed in full. All questions and concerns regarding medications addressed.    Orders Placed This Encounter  Procedures   CBC with Differential/Platelet   COMPLETE METABOLIC PANEL WITH GFR   Lipid panel   Hemoglobin A1c    Continue diet and meds as discussed. Further disposition pending results of labs. Discussed med's effects and SE's.   Over 30  minutes of exam, counseling, chart review, and critical decision making was performed.   Future Appointments  Date Time Provider [place]  12/24/2021  9:20 AM Ofilia Neas, PA-C [facility] None  03/19/2022 11:00 AM Darrol Jump, NP [facility] None  09/16/2022 10:00 AM Unk Pinto, MD [facility] None    ----------------------------------------------------------------------------------------------------------------------  HPI 66 y.o. female  presents for 3 month follow up on hypertension, cholesterol, diabetes, weight and vitamin D deficiency.   She follows with [facility] for seronegative rheumatoid arthritis and osteoarthritis overlap.  Last seen 07/2021.  She states she is doing quite well on hydroxychloroquine 200 mg p.o. twice daily Monday to Friday.  She denies any joint swelling.  She continues to have some joint stiffness in her hands and discomfort.  Bilateral knee replacements are doing well.  She experiences some pain in her feet off and on.  She has generalized pain and discomfort from fibromyalgia.  Recently had annual physical with Dr. Eddie North 09/15/21.  Overall she reports doing well today.  BMI is Body mass index is 34.19 kg/m., she has not been working on diet and exercise.  She has gained 2 lb over the last 3 mo. Wt Readings from Last 3 Encounters:  12/17/21 199 lb 3.2 oz (90.4 kg)  09/15/21 197 lb (89.4 kg)  07/23/21 199 lb (90.3 kg)    Her blood pressure has been controlled at home, today their BP is BP: 112/78  She does not workout. She denies chest pain, shortness of breath, dizziness.   She is on cholesterol medication Rosuvastatin and denies myalgias. Her cholesterol is not at goal with elevated triglycerides. The cholesterol last visit was:   Lab Results  Component Value Date   CHOL 123 09/15/2021   HDL 44 (L) 09/15/2021   [place]  54 09/15/2021   TRIG 167 (H) 09/15/2021   CHOLHDL 2.8 09/15/2021    She has not been working on diet and  exercise for prediabetes, and denies polydipsia and polyuria. Last A1C in the office was:  Lab Results  Component Value Date   HGBA1C 5.9 (H) 09/15/2021   Patient is on Vitamin D supplement.   Lab Results  Component Value Date   VD25OH 60 09/15/2021      Current Medications:  Current Outpatient Medications on File Prior to Visit  Medication Sig   acetaminophen (TYLENOL) 500 MG tablet Take 500 mg by mouth every 6 (six) hours as needed.   acyclovir (ZOVIRAX) 400 MG tablet Take 1 tablet (400 mg total) by mouth 3 (three) times daily as needed (fever blisters).   aspirin EC 81 MG tablet Take 81 mg by mouth daily.   cetirizine (ZYRTEC) 10 MG tablet Take 10 mg by mouth daily.    Cholecalciferol (VITAMIN D PO) Take 8,000 Int'l Units by mouth daily. 2000 units per capsule   CINNAMON PO Take 2,000 mg by mouth daily.   clonazePAM (KLONOPIN) 1 MG tablet Take 1 mg by mouth at bedtime as needed for anxiety.    diclofenac Sodium (VOLTAREN) 1 % GEL Apply topically daily.   esomeprazole (NEXIUM) 40 MG capsule Take  1 capsule  Daily  for Acid Reflux   glucose blood (ACCU-CHEK AVIVA PLUS) test strip CHECK BLOOD SUGAR 1 TIME DAILY. DX-R73.03   hydrochlorothiazide (HYDRODIURIL) 25 MG tablet TAKE 1 TABLET BY MOUTH DAILY FOR BLOOD PRESSURE AND FLUID RETENTION OR ANKLE SWELLING   hydroxychloroquine (PLAQUENIL) 200 MG tablet Take1 tablet by mouth twice daily Monday to Friday.   ibuprofen (ADVIL) 200 MG tablet Take 200 mg by mouth every 6 (six) hours as needed.   Lancets (ACCU-CHEK MULTICLIX) lancets Use to check blood glucose daily   Lysine 500 MG TABS Take 1,000 mg by mouth daily. Currently taking 2 pill QD   Magnesium 500 MG TABS Take 2,000 mg by mouth daily. Takes 4 tablets daily   Multiple Vitamin (MULTIVITAMIN WITH MINERALS) TABS tablet Take 0.5 tablets by mouth 2 (two) times daily.   QUEtiapine (SEROQUEL) 100 MG tablet Take 100 mg by mouth 3 (three) times daily.   rosuvastatin (CRESTOR) 40 MG tablet  TAKE 1 TABLET BY MOUTH DAILY FOR CHOLESTEROL   vitamin C (ASCORBIC ACID) 500 MG tablet Take 500 mg by mouth 2 (two) times daily.   zinc gluconate 50 MG tablet Take 50 mg by mouth daily.   Aspirin Effervescent (ALKA-SELTZER ORIGINAL PO) Take by mouth. (Patient not taking: Reported on 12/17/2021)   dexamethasone (DECADRON) 4 MG tablet Take 1 tab 3 x day - 3 days, then 2 x day - 3 days, then 1 tab daily (Patient not taking: Reported on 12/17/2021)   lamoTRIgine (LAMICTAL) 150 MG tablet Take 1 & 1/2 tablets at bedtime (Patient taking differently: Take 1 tablet at bedtime)   Phenyleph-Diphenhyd-DM-APAP (THERAFLU SEVERE COLD & COUGH PO) Take by mouth. (Patient not taking: Reported on 12/17/2021)   No current facility-administered medications on file prior to visit.     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 The largest of these lies in the midportion of the kidney with increased echogenicity within and suggestion of internal color flow which may be related to septation. MRI of the kidneys with and w   Status post total shoulder arthroplasty, left 01/07/2019   Vitamin D deficiency    Family history- Reviewed and unchanged Social history- Reviewed and  unchanged   Review of Systems: All review systems reviewed and negative except for pertinent positives in history of present illness.   Physical Exam: BP 112/78   Pulse 76   Temp (!) 96.9 F (36.1 C)   Wt 199 lb 3.2 oz (90.4 kg)   SpO2 99%   BMI 34.19 kg/m   She has not been working on diet an exercise.  She has gained 2 lb over the last 3 mo. Wt Readings from Last 3 Encounters:  12/17/21 199 lb 3.2 oz (90.4 kg)  09/15/21 197 lb (89.4 kg)  07/23/21 199 lb (90.3 kg)   General Appearance: Well nourished, in no apparent distress. Eyes: PERRLA, EOMs, conjunctiva no swelling or erythema Sinuses: No Frontal/maxillary tenderness ENT/Mouth: Ext aud canals clear, TMs without erythema, bulging. No erythema, swelling, or exudate on post pharynx.  Tonsils not swollen or erythematous. Hearing normal.  Neck: Supple, thyroid normal.  Respiratory: Respiratory effort normal, BS equal bilaterally without rales, rhonchi, wheezing or stridor.  Cardio: RRR with no MRGs. Brisk peripheral pulses without edema.  Abdomen: Soft, + BS.  Non tender, no guarding, rebound, hernias, masses. Lymphatics: Non tender without lymphadenopathy.  Musculoskeletal: Full ROM, 5/5 strength, Normal gait Skin: Warm, dry without rashes, lesions, ecchymosis.  Neuro: Cranial nerves intact. No cerebellar symptoms.  Psych: Awake and oriented X 3, normal affect, Insight and Judgment appropriate.    Darrol Jump, NP 10:32 AM [facility]

## 2021-12-18 LAB — CBC WITH DIFFERENTIAL/PLATELET
Absolute Monocytes: 485 cells/uL (ref 200–950)
Basophils Absolute: 29 cells/uL (ref 0–200)
Basophils Relative: 0.6 %
Eosinophils Absolute: 130 cells/uL (ref 15–500)
Eosinophils Relative: 2.7 %
HCT: 42.5 % (ref 35.0–45.0)
Hemoglobin: 14 g/dL (ref 11.7–15.5)
Lymphs Abs: 1402 cells/uL (ref 850–3900)
MCH: 29.7 pg (ref 27.0–33.0)
MCHC: 32.9 g/dL (ref 32.0–36.0)
MCV: 90.2 fL (ref 80.0–100.0)
MPV: 10.8 fL (ref 7.5–12.5)
Monocytes Relative: 10.1 %
Neutro Abs: 2755 cells/uL (ref 1500–7800)
Neutrophils Relative %: 57.4 %
Platelets: 257 10*3/uL (ref 140–400)
RBC: 4.71 10*6/uL (ref 3.80–5.10)
RDW: 12.7 % (ref 11.0–15.0)
Total Lymphocyte: 29.2 %
WBC: 4.8 10*3/uL (ref 3.8–10.8)

## 2021-12-18 LAB — COMPLETE METABOLIC PANEL WITH GFR
AG Ratio: 1.9 (calc) (ref 1.0–2.5)
ALT: 24 U/L (ref 6–29)
AST: 20 U/L (ref 10–35)
Albumin: 4.5 g/dL (ref 3.6–5.1)
Alkaline phosphatase (APISO): 90 U/L (ref 37–153)
BUN: 15 mg/dL (ref 7–25)
CO2: 29 mmol/L (ref 20–32)
Calcium: 9.6 mg/dL (ref 8.6–10.4)
Chloride: 106 mmol/L (ref 98–110)
Creat: 0.75 mg/dL (ref 0.50–1.05)
Globulin: 2.4 g/dL (calc) (ref 1.9–3.7)
Glucose, Bld: 97 mg/dL (ref 65–99)
Potassium: 4.5 mmol/L (ref 3.5–5.3)
Sodium: 143 mmol/L (ref 135–146)
Total Bilirubin: 0.5 mg/dL (ref 0.2–1.2)
Total Protein: 6.9 g/dL (ref 6.1–8.1)
eGFR: 89 mL/min/{1.73_m2} (ref >=60)

## 2021-12-18 LAB — HEMOGLOBIN A1C
Hgb A1c MFr Bld: 5.9 % of total Hgb — ABNORMAL HIGH (ref <5.7)
Mean Plasma Glucose: 123 mg/dL
eAG (mmol/L): 6.8 mmol/L

## 2021-12-18 LAB — LIPID PANEL
Cholesterol: 134 mg/dL (ref <200)
HDL: 51 mg/dL (ref >=50)
LDL Cholesterol (Calc): 64 mg/dL (calc)
Non-HDL Cholesterol (Calc): 83 mg/dL (calc) (ref <130)
Total CHOL/HDL Ratio: 2.6 (calc) (ref <5.0)
Triglycerides: 102 mg/dL (ref <150)

## 2021-12-24 ENCOUNTER — Encounter: Payer: Self-pay | Admitting: Physician Assistant

## 2021-12-24 ENCOUNTER — Ambulatory Visit: Payer: Medicare PPO | Admitting: Physician Assistant

## 2021-12-24 VITALS — BP 146/77 | HR 86 | Ht 64.0 in | Wt 199.0 lb

## 2021-12-24 DIAGNOSIS — Z96653 Presence of artificial knee joint, bilateral: Secondary | ICD-10-CM

## 2021-12-24 DIAGNOSIS — R768 Other specified abnormal immunological findings in serum: Secondary | ICD-10-CM

## 2021-12-24 DIAGNOSIS — M797 Fibromyalgia: Secondary | ICD-10-CM | POA: Diagnosis not present

## 2021-12-24 DIAGNOSIS — Z9989 Dependence on other enabling machines and devices: Secondary | ICD-10-CM

## 2021-12-24 DIAGNOSIS — M19072 Primary osteoarthritis, left ankle and foot: Secondary | ICD-10-CM

## 2021-12-24 DIAGNOSIS — M19041 Primary osteoarthritis, right hand: Secondary | ICD-10-CM

## 2021-12-24 DIAGNOSIS — G4733 Obstructive sleep apnea (adult) (pediatric): Secondary | ICD-10-CM

## 2021-12-24 DIAGNOSIS — E559 Vitamin D deficiency, unspecified: Secondary | ICD-10-CM

## 2021-12-24 DIAGNOSIS — M0609 Rheumatoid arthritis without rheumatoid factor, multiple sites: Secondary | ICD-10-CM | POA: Diagnosis not present

## 2021-12-24 DIAGNOSIS — Z87898 Personal history of other specified conditions: Secondary | ICD-10-CM

## 2021-12-24 DIAGNOSIS — I10 Essential (primary) hypertension: Secondary | ICD-10-CM

## 2021-12-24 DIAGNOSIS — F3341 Major depressive disorder, recurrent, in partial remission: Secondary | ICD-10-CM

## 2021-12-24 DIAGNOSIS — E782 Mixed hyperlipidemia: Secondary | ICD-10-CM

## 2021-12-24 DIAGNOSIS — M19042 Primary osteoarthritis, left hand: Secondary | ICD-10-CM

## 2021-12-24 DIAGNOSIS — Z79899 Other long term (current) drug therapy: Secondary | ICD-10-CM | POA: Diagnosis not present

## 2021-12-24 DIAGNOSIS — M19071 Primary osteoarthritis, right ankle and foot: Secondary | ICD-10-CM

## 2022-01-26 ENCOUNTER — Other Ambulatory Visit: Payer: Self-pay | Admitting: Physician Assistant

## 2022-01-26 DIAGNOSIS — M0609 Rheumatoid arthritis without rheumatoid factor, multiple sites: Secondary | ICD-10-CM

## 2022-01-26 NOTE — Telephone Encounter (Signed)
Next Visit: 06/11/2022  Last Visit: 12/24/2021  Labs: 12/17/2021 WNL  Eye exam: 10/07/2021 WNL    Current Dose per office note 12/24/2021: Plaquenil 200 mg 1 tablet by mouth twice daily Monday to Friday.   [id number] arthritis of multiple sites with negative rheumatoid factor   Last Fill: 10/31/2021  Okay to refill Plaquenil?

## 2022-01-30 ENCOUNTER — Other Ambulatory Visit: Payer: Self-pay | Admitting: Nurse Practitioner

## 2022-03-10 ENCOUNTER — Ambulatory Visit (INDEPENDENT_AMBULATORY_CARE_PROVIDER_SITE_OTHER): Payer: Medicare PPO | Admitting: Nurse Practitioner

## 2022-03-10 ENCOUNTER — Encounter: Payer: Self-pay | Admitting: Nurse Practitioner

## 2022-03-10 VITALS — BP 130/72 | HR 80 | Temp 97.5°F | Ht 64.0 in | Wt 195.4 lb

## 2022-03-10 DIAGNOSIS — I10 Essential (primary) hypertension: Secondary | ICD-10-CM

## 2022-03-10 DIAGNOSIS — R42 Dizziness and giddiness: Secondary | ICD-10-CM

## 2022-03-10 MED ORDER — MECLIZINE HCL 25 MG PO TABS: ORAL_TABLET | ORAL | 0 refills | Status: DC

## 2022-03-10 MED ORDER — ONDANSETRON HCL 4 MG PO TABS: 4.0000 mg | ORAL_TABLET | Freq: Every day | ORAL | 1 refills | Status: DC | PRN

## 2022-03-10 NOTE — Progress Notes (Signed)
Assessment and Plan: [name] was seen today for dizziness.  Diagnoses and all orders for this visit:  Vertigo Use meclizine and zofran as needed for dizziness and nausea If symptoms worsen or develops severe headache, weakness in one side, changes in speech go to the ER -     meclizine (ANTIVERT) 25 MG tablet; 1/2-1 pill up to 3 times daily for motion sickness/dizziness -     ondansetron (ZOFRAN) 4 MG tablet; Take 1 tablet (4 mg total) by mouth daily as needed for nausea or vomiting.  Essential hypertension - continue medications, DASH diet, exercise and monitor at home. Call if greater than 130/80.        Further disposition pending results of labs. Discussed med's effects and SE's.   Over 30 minutes of exam, counseling, chart review, and critical decision making was performed.   Future Appointments  Date Time Provider [place]  03/19/2022 11:00 AM Darrol Jump, NP [facility] None  06/11/2022 10:00 AM Bo Merino, MD [facility] None  09/16/2022 10:00 AM Unk Pinto, MD [facility] None    ------------------------------------------------------------------------------------------------------------------   HPI BP 130/72   Pulse 80   Temp (!) 97.5 F (36.4 C)   Ht '5\' 4"'$  (1.626 m)   Wt 195 lb 6.4 oz (88.6 kg)   SpO2 97%   BMI 33.54 kg/m    66 y.o.female presents for dizziness and feeling off balance which began yesterday. She also feels nauseated.  Feels like room is spinning.   She has had these episodes 3-4 times in the past 2 months. She has no history of vertigo. Denies headache, muscle weakness unilaterally, change in vision/hearing/speech  She is currently on HCTZ 25 mg QD. Denies chest pain, shortness of breath and headache BP Readings from Last 3 Encounters:  03/10/22 130/72  12/24/21 (!) 146/77  12/17/21 112/78     Past Medical History:  Diagnosis Date   Fibromyalgia    Hypertension    S/P total knee arthroplasty, left 01/07/2019   Simple  renal cyst 12/12/2019   Incidentally noted on abd [place] 11/2019 - radiologist recommended further characterization with MRI w & w/o contrast   Hypoechoic lesions within the kidney which may simply represent mildly complicated cysts. The largest of these lies in the midportion of the kidney with increased echogenicity within and suggestion of internal color flow which may be related to septation. 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   acyclovir (ZOVIRAX) 400 MG tablet Take 1 tablet (400 mg total) by mouth 3 (three) times daily as needed (fever blisters).   aspirin EC 81 MG tablet Take 81 mg by mouth daily.   cetirizine (ZYRTEC) 10 MG tablet Take 10  mg by mouth daily.    Cholecalciferol (VITAMIN D PO) Take 8,000 Int'l Units by mouth daily. 2000 units per capsule   CINNAMON PO Take 2,000 mg by mouth daily.   clonazePAM (KLONOPIN) 1 MG tablet Take 1 mg by mouth at bedtime as needed for anxiety.    diclofenac Sodium (VOLTAREN) 1 % GEL Apply topically daily.   hydrochlorothiazide (HYDRODIURIL) 25 MG tablet TAKE 1 TABLET BY MOUTH DAILY FOR BLOOD PRESSURE AND FLUID RETENTION OR ANKLE SWELLING   hydroxychloroquine (PLAQUENIL) 200 MG tablet TAKE 1 TABLET BY MOUTH TWICE DAILY( EVERY TWELVE HOURS) MONDAY TO FRIDAY   ibuprofen (ADVIL) 200 MG tablet Take 200 mg by mouth every 6  (six) hours as needed.   Lysine 500 MG TABS Take 1,000 mg by mouth daily. Currently taking 2 pill QD   Magnesium 500 MG TABS Take 2,000 mg by mouth daily. Takes 4 tablets daily   Multiple Vitamin (MULTIVITAMIN WITH MINERALS) TABS tablet Take 0.5 tablets by mouth 2 (two) times daily.   QUEtiapine (SEROQUEL) 100 MG tablet Take 100 mg by mouth 3 (three) times daily.   rosuvastatin (CRESTOR) 40 MG tablet TAKE 1 TABLET BY MOUTH DAILY FOR CHOLESTEROL   vitamin C (ASCORBIC ACID) 500 MG tablet Take 500 mg by mouth 2 (two) times daily.   zinc gluconate 50 MG tablet Take 50 mg by mouth daily.   Aspirin Effervescent (ALKA-SELTZER ORIGINAL PO) Take by mouth. (Patient not taking: Reported on 12/17/2021)   esomeprazole (NEXIUM) 40 MG capsule Take  1 capsule  Daily  for Acid Reflux (Patient not taking: Reported on 03/10/2022)   glucose blood (ACCU-CHEK AVIVA PLUS) test strip CHECK BLOOD SUGAR 1 TIME DAILY. DX-R73.03   lamoTRIgine (LAMICTAL) 150 MG tablet Take 1 & 1/2 tablets at bedtime (Patient taking differently: Take 1 tablet at bedtime)   Lancets (ACCU-CHEK MULTICLIX) lancets Use to check blood glucose daily   Phenyleph-Diphenhyd-DM-APAP (THERAFLU SEVERE COLD & COUGH PO) Take by mouth. (Patient not taking: Reported on 12/17/2021)   No current facility-administered medications on file prior to visit.    ROS: all negative except above.   Physical Exam:  BP 130/72   Pulse 80   Temp (!) 97.5 F (36.4 C)   Ht '5\' 4"'$  (1.626 m)   Wt 195 lb 6.4 oz (88.6 kg)   SpO2 97%   BMI 33.54 kg/m   General Appearance: Well nourished, in no apparent distress. Eyes: PERRLA, EOMs, conjunctiva no swelling or erythema Sinuses: No Frontal/maxillary tenderness ENT/Mouth: Ext aud canals clear, TMs without erythema, bulging. No erythema, swelling, or exudate on post pharynx.  Tonsils not swollen or erythematous. Hearing normal.  Neck: Supple, thyroid normal.  Respiratory: Respiratory effort normal, BS equal bilaterally  without rales, rhonchi, wheezing or stridor.  Cardio: RRR with no MRGs. Brisk peripheral pulses without edema.  Abdomen: Soft, + BS.  Non tender, no guarding, rebound, hernias, masses. Lymphatics: Non tender without lymphadenopathy.  Musculoskeletal: Full ROM, 5/5 strength, normal gait.  Skin: Warm, dry without rashes, lesions, ecchymosis.  Neuro: Cranial nerves intact. Normal muscle tone, no cerebellar symptoms. Sensation intact.  Mental status: Alert, oriented, thought content appropriate, alertness: alert Cranial nerves: normal Sensory: normal Motor: grossly normal Reflexes: 2+ and symmetric Coordination: finger to nose normal bilaterally Psych: Awake and oriented X 3, normal affect, Insight and Judgment appropriate.     Alycia Rossetti, NP 3:14 PM [facility]

## 2022-03-10 NOTE — Patient Instructions (Signed)
Vertigo Vertigo is the feeling that you or the things around you are moving when they are not. This feeling can come and go at any time. Vertigo often goes away on its own. This condition can be dangerous if it happens when you are doing activities like driving or working with machines. Your doctor will do tests to find the cause of your vertigo. These tests will also help your doctor decide on the best treatment for you. Follow these instructions at home: Eating and drinking     Drink enough fluid to keep your pee (urine) pale yellow. Do not drink alcohol. Activity Return to your normal activities when your doctor says that it is safe. In the morning, first sit up on the side of the bed. When you feel okay, stand slowly while you hold onto something until you know that your balance is fine. Move slowly. Avoid sudden body or head movements or certain positions, as told by your doctor. Use a cane if you have trouble standing or walking. Sit down right away if you feel dizzy. Avoid doing any tasks or activities that can cause danger to you or others if you get dizzy. Avoid bending down if you feel dizzy. Place items in your home so that they are easy for you to reach without bending or leaning over. Do not drive or use machinery if you feel dizzy. General instructions Take over-the-counter and prescription medicines only as told by your doctor. Keep all follow-up visits. Contact a doctor if: Your medicine does not help your vertigo. Your problems get worse or you have new symptoms. You have a fever. You feel like you may vomit (nauseous), or this feeling gets worse. You start to vomit. Your family or friends see changes in how you act. You lose feeling (have numbness) in part of your body. You feel prickling and tingling in a part of your body. Get help right away if: You are always dizzy. You faint. You get very bad headaches. You get a stiff neck. Bright light starts to bother  you. You have trouble moving or talking. You feel weak in your hands, arms, or legs. You have changes in your hearing or in how you see (vision). These symptoms may be an emergency. Get help right away. Call your local emergency services (911 in the [place] Do not wait to see if the symptoms will go away. Do not drive yourself to the hospital. Summary Vertigo is the feeling that you or the things around you are moving when they are not. Your doctor will do tests to find the cause of your vertigo. You may be told to avoid some tasks, positions, or movements. Contact a doctor if your medicine is not helping, or if you have a fever, new symptoms, or a change in how you act. Get help right away if you get very bad headaches, or if you have changes in how you speak, hear, or see. This information is not intended to replace advice given to you by your health care provider. Make sure you discuss any questions you have with your health care provider. Document Revised: 05/08/2020 Document Reviewed: 05/08/2020 Elsevier Patient Education  2023 [employer]  

## 2022-03-11 ENCOUNTER — Ambulatory Visit: Payer: Medicare PPO | Admitting: Nurse Practitioner

## 2022-03-12 DIAGNOSIS — F3132 Bipolar disorder, current episode depressed, moderate: Secondary | ICD-10-CM | POA: Diagnosis not present

## 2022-03-13 ENCOUNTER — Ambulatory Visit: Payer: Medicare PPO | Admitting: Adult Health

## 2022-03-19 ENCOUNTER — Encounter: Payer: Self-pay | Admitting: Nurse Practitioner

## 2022-03-19 ENCOUNTER — Ambulatory Visit (INDEPENDENT_AMBULATORY_CARE_PROVIDER_SITE_OTHER): Payer: Medicare PPO | Admitting: Nurse Practitioner

## 2022-03-19 VITALS — BP 110/76 | HR 79 | Temp 97.7°F | Ht 64.0 in | Wt 198.2 lb

## 2022-03-19 DIAGNOSIS — Z0001 Encounter for general adult medical examination with abnormal findings: Secondary | ICD-10-CM

## 2022-03-19 DIAGNOSIS — Z79899 Other long term (current) drug therapy: Secondary | ICD-10-CM

## 2022-03-19 DIAGNOSIS — G4733 Obstructive sleep apnea (adult) (pediatric): Secondary | ICD-10-CM | POA: Diagnosis not present

## 2022-03-19 DIAGNOSIS — Z9989 Dependence on other enabling machines and devices: Secondary | ICD-10-CM

## 2022-03-19 DIAGNOSIS — M170 Bilateral primary osteoarthritis of knee: Secondary | ICD-10-CM | POA: Diagnosis not present

## 2022-03-19 DIAGNOSIS — E559 Vitamin D deficiency, unspecified: Secondary | ICD-10-CM | POA: Diagnosis not present

## 2022-03-19 DIAGNOSIS — E782 Mixed hyperlipidemia: Secondary | ICD-10-CM

## 2022-03-19 DIAGNOSIS — I10 Essential (primary) hypertension: Secondary | ICD-10-CM

## 2022-03-19 DIAGNOSIS — F3341 Major depressive disorder, recurrent, in partial remission: Secondary | ICD-10-CM

## 2022-03-19 DIAGNOSIS — M0600 Rheumatoid arthritis without rheumatoid factor, unspecified site: Secondary | ICD-10-CM

## 2022-03-19 DIAGNOSIS — Z0000 Encounter for general adult medical examination without abnormal findings: Secondary | ICD-10-CM

## 2022-03-19 DIAGNOSIS — R6889 Other general symptoms and signs: Secondary | ICD-10-CM

## 2022-03-19 NOTE — Progress Notes (Signed)
MEDICARE ANNUAL WELLNESS VISIT AND FOLLOW UP  Assessment:   Diagnoses and all orders for this visit:  Encounter for Medicare annual wellness exam Due annually  Health maintenance reviewed   Essential hypertension Discussed DASH (Dietary Approaches to Stop Hypertension) DASH diet is lower in sodium than a typical American diet. Cut back on foods that are high in saturated fat, cholesterol, and trans fats. Eat more whole-grain foods, fish, poultry, and nuts Remain active and exercise as tolerated daily.  Monitor BP at home-Call if greater than 130/80.  Check CMP/CBC   Vitamin D deficiency Continue supplement. Check and monitor levels  Prediabetes Education: Reviewed 'ABCs' of diabetes management  Discussed goals to be met and/or maintained include A1C (<7) Blood pressure (<130/80) Cholesterol (LDL <70) Continue Eye Exam yearly  Continue Dental Exam Q6 mo Discussed dietary recommendations Check A1C   Morbid obesity - BMI 30+ with OSA  Discussed appropriate BMI Goal of losing 1 lb per month. Diet modification. Physical activity. Encouraged/praised to build confidence.  Mixed hyperlipidemia Discussed lifestyle modifications. Recommended diet heavy in fruits and veggies, omega 3's. Decrease consumption of animal meats, cheeses, and dairy products. Remain active and exercise as tolerated. Continue to monitor. Check lipids/TSH   Medication management All medications discussed and reviewed in full. All questions and concerns regarding medications addressed.     Fibromyalgia Lifestyle discussed: diet/exerise, sleep hygiene, stress management, hydration Continue current medications for sleep management  Depression, major, recurrent, in partial remission ([place])  Psych manages meds  Lifestyle discussed: diet/exerise, sleep hygiene, stress management, hydration Reviewed relaxation techniques.  Sleep hygiene. Recommended mindfulness meditation and exercise.    Psychoeducation:  encouraged personality growth wand development through coping techniques and problem-solving skills. Limit/Decrease/Monitor drug/alcohol intake.      OSA on CPAP 100% compliance Improved. Continue to monitor   Primary osteoarthritis of both knees Followed by Dr. Berenice Primas, s/p bil TKA  Continue to monitor  Seronegative RA ([place]) Followed by Dr. Estanislado Pandy;  On hydroxychloroquine and ginger root Continue to monitor   Notify office for further evaluation and treatment, questions or concerns if any reported s/s fail to improve.   The patient was advised to call back or seek an in-person evaluation if any symptoms worsen or if the condition fails to improve as anticipated.   Further disposition pending results of labs. Discussed med's effects and SE's.    I discussed the assessment and treatment plan with the patient. The patient was provided an opportunity to ask questions and all were answered. The patient agreed with the plan and demonstrated an understanding of the instructions.  Discussed med's effects and SE's. Screening labs and tests as requested with regular follow-up as recommended.  I provided 30 minutes of face-to-face time during this encounter including counseling, chart review, and critical decision making was preformed.   Future Appointments  Date Time Provider [place]  06/11/2022 10:00 AM Bo Merino, MD [facility] None  09/16/2022 10:00 AM Unk Pinto, MD [facility] None  12/23/2022 11:00 AM Darrol Jump, NP [facility] None     Plan:   During the course of the visit the patient was educated and counseled about appropriate screening and preventive services including:   Pneumococcal vaccine  Prevnar 13 Influenza vaccine Td vaccine Screening electrocardiogram Bone densitometry screening Colorectal cancer screening Diabetes screening Glaucoma screening Nutrition counseling  Advanced directives: requested   Subjective:   [name] is a 66 y.o. female who presents for Medicare Annual Wellness Visit and 3 month follow up. She has  Essential hypertension; Vitamin D deficiency; Fibromyalgia; Depression, major, recurrent, in partial remission ([place]); Morbid obesity ([place]) - BMI 30+ with OSA; OSA on CPAP; Osteoarthritis of both knees; Other abnormal glucose (prediabetes); Fatty liver; Hyperlipidemia; Seronegative rheumatoid arthritis ([place]); and Osteopenia on their problem list.  Overall she reports feeling well.  She has no new or additional concerns in clinic today.  Patient has been on SS Disability since age 56 (2007) for Panic, Anxiety & Depression, and also has fibromyalgia. She is followed by PA Gareth Eagle for mental health management. She reports she is doing very well at this time.   She is followed by mental health.  OSA on CPAP with reported 100% compliance and endorses restorative sleep.   She is followed by Dr. Berenice Primas for arthritis; s/p bil TKA. Newly seeing Dr. Estanislado Pandy for seronegative RA complicated by OA, now on hydroxychloroquine and started on ginger root. Has burning in hands that bothers her at night. Taking tylenol to help managed -   BMI is Body mass index is 34.02 kg/m., she has been working on diet, exercise. Wt Readings from Last 3 Encounters:  03/19/22 198 lb 3.2 oz (89.9 kg)  03/10/22 195 lb 6.4 oz (88.6 kg)  12/24/21 199 lb (90.3 kg)    Her blood pressure has been controlled at home, today their BP is BP: 110/76 She does not workout. She denies chest pain, shortness of breath, dizziness.   She is on cholesterol medication (crestor 20 mg daily) and denies myalgias. Her LDL cholesterol is at goal. The cholesterol last visit was:   Lab Results  Component Value Date   CHOL 134 12/17/2021   HDL 51 12/17/2021   LDLCALC 64 12/17/2021   TRIG 102 12/17/2021   CHOLHDL 2.6 12/17/2021    She has not been working on diet and exercise for prediabetes, and denies foot ulcerations,  increased appetite, nausea, paresthesia of the feet, polydipsia, polyuria, visual disturbances, vomiting and weight loss. Last A1C in the office was:  Lab Results  Component Value Date   HGBA1C 5.9 (H) 12/17/2021   Last GFR: Lab Results  Component Value Date   GFRNONAA 89 09/02/2020   Patient is on Vitamin D supplement and at goal at recent check:   Lab Results  Component Value Date   VD25OH 70 09/15/2021       Medication Review: Current Outpatient Medications on File Prior to Visit  Medication Sig Dispense Refill   acetaminophen (TYLENOL) 500 MG tablet Take 500 mg by mouth every 6 (six) hours as needed.     acyclovir (ZOVIRAX) 400 MG tablet Take 1 tablet (400 mg total) by mouth 3 (three) times daily as needed (fever blisters). 21 tablet 1   aspirin EC 81 MG tablet Take 81 mg by mouth daily.     cetirizine (ZYRTEC) 10 MG tablet Take 10 mg by mouth daily.      Cholecalciferol (VITAMIN D PO) Take 8,000 Int'l Units by mouth daily. 2000 units per capsule     CINNAMON PO Take 2,000 mg by mouth daily.     clonazePAM (KLONOPIN) 1 MG tablet Take 1 mg by mouth at bedtime as needed for anxiety.      diclofenac Sodium (VOLTAREN) 1 % GEL Apply topically daily.     glucose blood (ACCU-CHEK AVIVA PLUS) test strip CHECK BLOOD SUGAR 1 TIME DAILY. DX-R73.03 100 each 1   hydrochlorothiazide (HYDRODIURIL) 25 MG tablet TAKE 1 TABLET BY MOUTH DAILY FOR BLOOD PRESSURE AND FLUID RETENTION OR ANKLE SWELLING  90 tablet 3   hydroxychloroquine (PLAQUENIL) 200 MG tablet TAKE 1 TABLET BY MOUTH TWICE DAILY( EVERY TWELVE HOURS) MONDAY TO FRIDAY 120 tablet 0   ibuprofen (ADVIL) 200 MG tablet Take 200 mg by mouth every 6 (six) hours as needed.     Lancets (ACCU-CHEK MULTICLIX) lancets Use to check blood glucose daily 100 each PRN   Lysine 500 MG TABS Take 1,000 mg by mouth daily. Currently taking 2 pill QD     Magnesium 500 MG TABS Take 2,000 mg by mouth daily. Takes 4 tablets daily     meclizine (ANTIVERT) 25 MG  tablet 1/2-1 pill up to 3 times daily for motion sickness/dizziness 30 tablet 0   Multiple Vitamin (MULTIVITAMIN WITH MINERALS) TABS tablet Take 0.5 tablets by mouth 2 (two) times daily.     ondansetron (ZOFRAN) 4 MG tablet Take 1 tablet (4 mg total) by mouth daily as needed for nausea or vomiting. 30 tablet 1   QUEtiapine (SEROQUEL) 100 MG tablet Take 100 mg by mouth 3 (three) times daily.     rosuvastatin (CRESTOR) 40 MG tablet TAKE 1 TABLET BY MOUTH DAILY FOR CHOLESTEROL 90 tablet 3   vitamin C (ASCORBIC ACID) 500 MG tablet Take 500 mg by mouth 2 (two) times daily.     zinc gluconate 50 MG tablet Take 50 mg by mouth daily.     lamoTRIgine (LAMICTAL) 150 MG tablet Take 1 & 1/2 tablets at bedtime (Patient taking differently: Take 1 tablet at bedtime) 135 tablet 1   No current facility-administered medications on file prior to visit.    Allergies  Allergen Reactions   Latex     Band aids= if left on for extended period of time   Celecoxib     REACTION: Rash (celebrex)   Cephalexin     REACTION: Rash (keflex)   Codeine     REACTION: Rash   Erythromycin     REACTION: Rash (emycin)   Sulfonamide Derivatives     REACTION: Rash   Trazodone And Nefazodone     insomnia   Venlafaxine     REACTION: Blurred Vision (effexor)    Current Problems (verified) Patient Active Problem List   Diagnosis Date Noted   Osteopenia 09/03/2021   Seronegative rheumatoid arthritis ([place]) 03/12/2021   Hyperlipidemia 03/11/2021   Fatty liver 12/12/2019   Other abnormal glucose (prediabetes) 05/23/2019   Osteoarthritis of both knees 03/20/2015   OSA on CPAP 08/15/2014   Fibromyalgia 01/31/2014   Depression, major, recurrent, in partial remission ([place]) 01/31/2014   Morbid obesity ([place]) - BMI 30+ with OSA 01/31/2014   Essential hypertension 08/09/2013   Vitamin D deficiency 08/09/2013    Screening Tests Immunization History  Administered Date(s) Administered   DT (Pediatric) 07/05/2015   Influenza  Inj Mdck Quad With Preservative 05/28/2017, 04/13/2018   Influenza Split 04/06/2012, 04/05/2013, 05/16/2014, 03/21/2015   Influenza,inj,Quad PF,6+ Mos 03/16/2019   Influenza,inj,quad, With Preservative 04/29/2016   PFIZER(Purple Top)SARS-COV-2 Vaccination 11/08/2019, 11/29/2019, 08/13/2020   Pneumococcal Conjugate-13 05/16/2014   Pneumococcal-Unspecified 06/22/1998   Td 06/22/2004   Zoster, Live 10/21/2015   Preventative care: Last colonoscopy: 2019 due 11/2027 Mammogram:  08/2021 Due yearly DEXA: 08/2021 T-Score -1.5 Due 2 years  Prior vaccinations:  TD or Tdap: 2017 Influenza: Due 03/2022 Pneumococcal: 2000 Prevnar13: 2015 Shingles/Zostavax: 2017, had first shot of shingrix with reaction, declined second shot Covid 19: 2/2, 2021, pfizer - booster 07/2020  Names of Other Physician/Practitioners you currently use: 1. [facility]  here for primary care 2. Dr. Erven Colla, eye doctor, last visit 2022, glasses, goes annually, monitoring mild cataracts  3. Dr. Jasmine December, dentist, last visit 2022, goes q110m   Patient Care Team: Unk Pinto, MD as PCP - General (Internal Medicine) Dorna Leitz, MD as Consulting Physician (Orthopedic Surgery) Milus Banister, MD as Attending Physician (Gastroenterology) Sheralyn Boatman, MD as Consulting Physician (Psychiatry) [name], [place] (Optometry) Pauline Good, NP as Consulting Physician (Nurse Practitioner)  SURGICAL HISTORY She  has a past surgical history that includes Abdominal hysterectomy (2002); Joint replacement; Total knee arthroplasty (Right, 04/14/2013); Colonoscopy (2009); Total knee arthroplasty (Left, 01/06/2019); and Knee Arthroplasty. FAMILY HISTORY Her family history includes COPD in her father; CVA (age of onset: 33) in her maternal grandmother; Cancer in her father; Deep vein thrombosis in her maternal grandfather; Heart attack in her mother; Heart attack (age of onset: 52) in her  maternal grandfather; Hypertension in her father and mother. SOCIAL HISTORY She  reports that she has never smoked. She has been exposed to tobacco smoke. She has never used smokeless tobacco. She reports that she does not drink alcohol and does not use drugs.   MEDICARE WELLNESS OBJECTIVES: Physical activity:   Cardiac risk factors:   Depression/mood screen:      03/22/2022   10:45 PM  Depression screen PHQ 2/9  Decreased Interest 0  Down, Depressed, Hopeless 0  PHQ - 2 Score 0    ADLs:     03/22/2022   10:45 PM  In your present state of health, do you have any difficulty performing the following activities:  Hearing? 0  Vision? 0  Difficulty concentrating or making decisions? 0  Walking or climbing stairs? 0  Dressing or bathing? 0  Doing errands, shopping? 0  Preparing Food and eating ? N  Using the Toilet? N  In the past six months, have you accidently leaked urine? N  Do you have problems with loss of bowel control? N  Managing your Medications? N  Managing your Finances? N  Housekeeping or managing your Housekeeping? N     Cognitive Testing  Alert? Yes  Normal Appearance?Yes  Oriented to person? Yes  Place? Yes   Time? Yes  Recall of three objects?  Yes  Can perform simple calculations? Yes  Displays appropriate judgment?Yes  Can read the correct time from a watch face?Yes  EOL planning: Does Patient Have a Medical Advance Directive?: Yes  Review of Systems  Constitutional:  Negative for malaise/fatigue and weight loss.  HENT:  Negative for congestion, hearing loss, sore throat and tinnitus.   Eyes:  Negative for blurred vision and double vision.  Respiratory:  Negative for cough, sputum production, shortness of breath and wheezing.   Cardiovascular:  Negative for chest pain, palpitations, orthopnea, claudication, leg swelling and PND.  Gastrointestinal:  Negative for abdominal pain, blood in stool, constipation, diarrhea, heartburn, melena, nausea and  vomiting.  Genitourinary: Negative.   Musculoskeletal:  Positive for joint pain (R foot, bil hands). Negative for falls and myalgias.  Skin:  Negative for rash.  Neurological:  Negative for dizziness, tingling, sensory change, weakness and headaches.  Endo/Heme/Allergies:  Negative for environmental allergies and polydipsia.  Psychiatric/Behavioral: Negative.  Negative for depression, memory loss, substance abuse and suicidal ideas. The patient is not nervous/anxious and does not have insomnia.   All other systems reviewed and are negative.    Objective:     Today's Vitals   03/19/22 1117  BP: 110/76  Pulse: 79  Temp: 97.7  F (36.5 C)  SpO2: 97%  Weight: 198 lb 3.2 oz (89.9 kg)  Height: $Remove'5\' 4"'[id number]$  (1.626 m)   Body mass index is 34.02 kg/m.  General appearance: alert, no distress, WD/WN, obese female HEENT: normocephalic, sclerae anicteric, TMs pearly, nares patent, no discharge or erythema, pharynx normal Oral cavity: MMM, no lesions Neck: supple, no lymphadenopathy, no thyromegaly, no masses Heart: RRR, normal S1, S2, no murmurs Lungs: CTA bilaterally, no wheezes, rhonchi, or rales Abdomen: +bs, soft, obese non tender, non distended, no masses, no hepatomegaly, no splenomegaly Musculoskeletal: Mild bony enlargement with swelling bil hand DIPs with mild swelling, otherwise no obvious deformity, no swelling, non-antalgic gait.  Extremities: no edema, no cyanosis, no clubbing Pulses: 2+ symmetric, upper and lower extremities, normal cap refill Neurological: alert, oriented x 3, CN2-12 intact, strength normal upper extremities and lower extremities, sensation normal throughout, DTRs 2+ throughout, no cerebellar signs, gait antalgit Psychiatric: normal affect, behavior normal, pleasant, somewhat anxious   Medicare Attestation I have personally reviewed: The patient's medical and social history Their use of alcohol, tobacco or illicit drugs Their current medications and  supplements The patient's functional ability including ADLs,fall risks, home safety risks, cognitive, and hearing and visual impairment Diet and physical activities Evidence for depression or mood disorders  The patient's weight, height, BMI, and visual acuity have been recorded in the chart.  I have made referrals, counseling, and provided education to the patient based on review of the above and I have provided the patient with a written personalized care plan for preventive services.     Darrol Jump, NP   03/22/2022

## 2022-06-01 NOTE — Progress Notes (Signed)
Office Visit Note  Patient: [name]             Date of Birth: 1955-08-19           MRN: [id number]             PCP: Unk Pinto, MD Referring: Unk Pinto, MD Visit Date: 06/11/2022 Occupation: _0 @  Subjective:  Right foot pain  History of Present Illness: KERRI KOVACIK is a 66 y.o. female with history of seronegative rheumatoid arthritis, osteoarthritis and fibromyalgia syndrome.  She has been taking hydroxychloroquine 200 mg p.o. twice daily Monday to Friday without interruption.  She states she has been having increased pain and discomfort in the dorsum of her right foot for the last 5 months.  She states pain is gradually getting worse.  She has not noticed any increased pain in her hands or her knee joints.  Both knee joints are replaced.  She denies having flare of fibromyalgia.  Activities of Daily Living:  Patient reports morning stiffness for 30 minutes.   Patient Reports nocturnal pain.  Difficulty dressing/grooming: Denies Difficulty climbing stairs: Denies Difficulty getting out of chair: Reports Difficulty using hands for taps, buttons, cutlery, and/or writing: Reports  Review of Systems  Constitutional:  Positive for fatigue.  HENT:  Positive for mouth dryness. Negative for mouth sores.   Eyes:  Positive for dryness.  Respiratory:  Positive for shortness of breath.   Cardiovascular:  Negative for chest pain and palpitations.  Gastrointestinal:  Negative for blood in stool, constipation and diarrhea.  Endocrine: Negative for increased urination.  Genitourinary:  Negative for involuntary urination.  Musculoskeletal:  Positive for joint pain, gait problem, joint pain and morning stiffness. Negative for joint swelling, myalgias, muscle weakness, muscle tenderness and myalgias.  Skin:  Negative for color change, rash, hair loss and sensitivity to sunlight.  Allergic/Immunologic: Negative for susceptible to infections.  Neurological:  Negative  for dizziness and headaches.  Hematological:  Negative for swollen glands.  Psychiatric/Behavioral:  Positive for depressed mood. Negative for sleep disturbance. The patient is nervous/anxious.     PMFS History:  Patient Active Problem List   Diagnosis Date Noted   Osteopenia 09/03/2021   Seronegative rheumatoid arthritis ([place]) 03/12/2021   Hyperlipidemia 03/11/2021   Fatty liver 12/12/2019   Other abnormal glucose (prediabetes) 05/23/2019   Osteoarthritis of both knees 03/20/2015   OSA on CPAP 08/15/2014   Fibromyalgia 01/31/2014   Depression, major, recurrent, in partial remission ([place]) 01/31/2014   Morbid obesity ([place]) - BMI 30+ with OSA 01/31/2014   Essential hypertension 08/09/2013   Vitamin D deficiency 08/09/2013    Past Medical History:  Diagnosis Date   Fibromyalgia    Hypertension    S/P total knee arthroplasty, left 01/07/2019   Simple renal cyst 12/12/2019   Incidentally noted on abd [place] 11/2019 - radiologist recommended further characterization with MRI w & w/o contrast   Hypoechoic lesions within the kidney which may simply represent mildly complicated cysts. The largest of these lies in the midportion of the kidney with increased echogenicity within and suggestion of internal color flow which may be related to septation. MRI of the kidneys with and w   Status post total shoulder arthroplasty, left 01/07/2019   Vitamin D deficiency     Family History  Problem Relation Age of Onset   Hypertension Mother    Heart attack Mother    Hypertension Father    COPD Father    Cancer Father  thyroid   CVA Maternal Grandmother 60   Heart attack Maternal Grandfather 59   Deep vein thrombosis Maternal Grandfather    Colon cancer Neg Hx    Esophageal cancer Neg Hx    Stomach cancer Neg Hx    Past Surgical History:  Procedure Laterality Date   ABDOMINAL HYSTERECTOMY  2002   Dr. Brien Mates    COLONOSCOPY  2009   JOINT REPLACEMENT     arthroscopy   KNEE ARTHROPLASTY      TOTAL KNEE ARTHROPLASTY Right 04/14/2013   Procedure: RIGHT TOTAL KNEE ARTHROPLASTY AND LEFT KNEE INJECTION ;  Surgeon: Alta Corning, MD;  Location: [facility];  Service: Orthopedics;  Laterality: Right;   TOTAL KNEE ARTHROPLASTY Left 01/06/2019   Procedure: TOTAL KNEE ARTHROPLASTY;  Surgeon: Dorna Leitz, MD;  Location: [facility];  Service: Orthopedics;  Laterality: Left;   Social History   Social History Narrative   Not on file   Immunization History  Administered Date(s) Administered   DT (Pediatric) 07/05/2015   Influenza Inj Mdck Quad With Preservative 05/28/2017, 04/13/2018   Influenza Split 04/06/2012, 04/05/2013, 05/16/2014, 03/21/2015   Influenza,inj,Quad PF,6+ Mos 03/16/2019   Influenza,inj,quad, With Preservative 04/29/2016   PFIZER(Purple Top)SARS-COV-2 Vaccination 11/08/2019, 11/29/2019, 08/13/2020   Pneumococcal Conjugate-13 05/16/2014   Pneumococcal-Unspecified 06/22/1998   Td 06/22/2004   Zoster, Live 10/21/2015     Objective: Vital Signs: BP (!) 166/75 (BP Location: Left Arm, Patient Position: Sitting, Cuff Size: Normal)   Pulse 84   Resp 16   Ht _0  (1.626 m)   Wt 199 lb 3.2 oz (90.4 kg)   BMI 34.19 kg/m    Physical Exam Vitals and nursing note reviewed.  Constitutional:      Appearance: She is well-developed.  HENT:     Head: Normocephalic and atraumatic.  Eyes:     Conjunctiva/sclera: Conjunctivae normal.  Cardiovascular:     Rate and Rhythm: Normal rate and regular rhythm.     Heart sounds: Normal heart sounds.  Pulmonary:     Effort: Pulmonary effort is normal.     Breath sounds: Normal breath sounds.  Abdominal:     General: Bowel sounds are normal.     Palpations: Abdomen is soft.  Musculoskeletal:     Cervical back: Normal range of motion.  Lymphadenopathy:     Cervical: No cervical adenopathy.  Skin:    General: Skin is warm and dry.     Capillary Refill: Capillary refill takes less than 2 seconds.  Neurological:     Mental Status: She is  alert and oriented to person, place, and time.  Psychiatric:        Behavior: Behavior normal.      Musculoskeletal Exam: Limited lateral rotation of the cervical spine.  Shoulder joints, elbow joints, wrist joints, MCPs PIPs been good range of motion.  She had bilateral PIP and DIP thickening and CMC prominence consistent with osteoarthritis.  No synovitis was noted.  Hip joints and knee joints were in good range of motion without any warmth swelling or effusion.  She had tenderness the dorsum of her right foot.  No synovitis noted over MTPs or PIPs.  CDAI Exam: CDAI Score: -- Patient Global: --; Provider Global: -- Swollen: --; Tender: -- Joint Exam 06/11/2022   No joint exam has been documented for this visit   There is currently no information documented on the homunculus. Go to the Rheumatology activity and complete the homunculus joint exam.  Investigation: No additional findings.  Imaging:  No results found.  Recent Labs: Lab Results  Component Value Date   WBC 4.8 12/17/2021   HGB 14.0 12/17/2021   PLT 257 12/17/2021   NA 143 12/17/2021   K 4.5 12/17/2021   CL 106 12/17/2021   CO2 29 12/17/2021   GLUCOSE 97 12/17/2021   BUN 15 12/17/2021   CREATININE 0.75 12/17/2021   BILITOT 0.5 12/17/2021   ALKPHOS 89 01/03/2019   AST 20 12/17/2021   ALT 24 12/17/2021   PROT 6.9 12/17/2021   ALBUMIN 4.7 01/03/2019   CALCIUM 9.6 12/17/2021   GFRAA 103 09/02/2020    Speciality Comments: PLQ Eye Exam: 10/07/2021 WNL @ [facility] Follow up in 1 year.   Dr. Sabra Heck 10/07/2021 will fax PLQ Eye Exam she had tenderness and some  Procedures:  No procedures performed Allergies: Latex, Celecoxib, Cephalexin, Codeine, Erythromycin, Sulfonamide derivatives, Trazodone and nefazodone, and Venlafaxine   Assessment / Plan:     Visit Diagnoses: Rheumatoid arthritis of multiple sites with negative rheumatoid factor ([place]) - RF-, +anti-CCP: Rosalene has been taking  hydroxychloroquine 200 mg p.o. twice daily twice a day Monday to Friday without any interruption.  He has not had any joint swelling.  Although she continues to have pain on the dorsum of her right foot.  She states difficult for her to wear shoes.  High risk medication use - Plaquenil 200 mg 1 tablet by mouth twice daily Monday to Friday. PLQ Eye Exam: 10/07/2021 -labs from December 17, 2021 CBC and CMP were normal.  Will check labs today.  Plan: CBC with Differential/Platelet, COMPLETE METABOLIC PANEL WITH GFR information on immunization was placed in the AVS.  Positive anti-CCP test - Anti-CCP 39 (weak positive), RF negative, ESR 6.  Primary osteoarthritis of both hands-she has osteoarthritis in bilateral hands with CMC, DIP and PIP thickening.  No synovitis was noted.  Status post total bilateral knee replacement -patient denies any discomfort in her knee joints.  She had surgery by Dr. Berenice Primas.  Pain in right foot-she has been having pain and discomfort in her right foot.  She has a dorsal spur which is painful and gets inflamed.  I will refer her to [facility] for further evaluation.  Primary osteoarthritis of both feet -no synovitis was noted on the examination.  Dorsal spur, right foot.  Fibromyalgia-she continues to have some generalized pain and discomfort.  She has few tender points.  She denies having a flare of fibromyalgia.  Other medical problems are listed as follows:  History of prediabetes  Mixed hyperlipidemia  Essential hypertension-her blood pressure was elevated at 166/75 today.  Repeat blood pressure was also elevated.  She was advised to monitor blood pressure closely and follow-up with her PCP.  Depression, major, recurrent, in partial remission ([place])  Vitamin D deficiency-she takes vitamin D supplement.  OSA on CPAP  Orders: Orders Placed This Encounter  Procedures   CBC with Differential/Platelet   COMPLETE METABOLIC PANEL WITH GFR   AMB referral to  orthopedics   No orders of the defined types were placed in this encounter.    Follow-Up Instructions: Return in about 5 months (around 11/10/2022) for Rheumatoid arthritis, Osteoarthritis.   Bo Merino, MD  Note - This record has been created using Editor, commissioning.  Chart creation errors have been sought, but may not always  have been located. Such creation errors do not reflect on  the standard of medical care.

## 2022-06-03 DIAGNOSIS — F3132 Bipolar disorder, current episode depressed, moderate: Secondary | ICD-10-CM | POA: Diagnosis not present

## 2022-06-11 ENCOUNTER — Encounter: Payer: Self-pay | Admitting: Rheumatology

## 2022-06-11 ENCOUNTER — Ambulatory Visit: Payer: Medicare PPO | Attending: Rheumatology | Admitting: Rheumatology

## 2022-06-11 VITALS — BP 166/75 | HR 84 | Resp 16 | Ht 64.0 in | Wt 199.2 lb

## 2022-06-11 DIAGNOSIS — M0609 Rheumatoid arthritis without rheumatoid factor, multiple sites: Secondary | ICD-10-CM | POA: Diagnosis not present

## 2022-06-11 DIAGNOSIS — M19071 Primary osteoarthritis, right ankle and foot: Secondary | ICD-10-CM | POA: Diagnosis not present

## 2022-06-11 DIAGNOSIS — Z96653 Presence of artificial knee joint, bilateral: Secondary | ICD-10-CM

## 2022-06-11 DIAGNOSIS — E559 Vitamin D deficiency, unspecified: Secondary | ICD-10-CM

## 2022-06-11 DIAGNOSIS — M79671 Pain in right foot: Secondary | ICD-10-CM | POA: Diagnosis not present

## 2022-06-11 DIAGNOSIS — R768 Other specified abnormal immunological findings in serum: Secondary | ICD-10-CM | POA: Diagnosis not present

## 2022-06-11 DIAGNOSIS — M19072 Primary osteoarthritis, left ankle and foot: Secondary | ICD-10-CM

## 2022-06-11 DIAGNOSIS — E782 Mixed hyperlipidemia: Secondary | ICD-10-CM

## 2022-06-11 DIAGNOSIS — G4733 Obstructive sleep apnea (adult) (pediatric): Secondary | ICD-10-CM

## 2022-06-11 DIAGNOSIS — M19041 Primary osteoarthritis, right hand: Secondary | ICD-10-CM | POA: Diagnosis not present

## 2022-06-11 DIAGNOSIS — F3341 Major depressive disorder, recurrent, in partial remission: Secondary | ICD-10-CM

## 2022-06-11 DIAGNOSIS — Z87898 Personal history of other specified conditions: Secondary | ICD-10-CM

## 2022-06-11 DIAGNOSIS — M19042 Primary osteoarthritis, left hand: Secondary | ICD-10-CM

## 2022-06-11 DIAGNOSIS — M797 Fibromyalgia: Secondary | ICD-10-CM

## 2022-06-11 DIAGNOSIS — Z79899 Other long term (current) drug therapy: Secondary | ICD-10-CM | POA: Diagnosis not present

## 2022-06-11 DIAGNOSIS — R7681 Abnormal rheumatoid factor and anti-citrullinated protein antibody without rheumatoid arthritis: Secondary | ICD-10-CM

## 2022-06-11 DIAGNOSIS — I10 Essential (primary) hypertension: Secondary | ICD-10-CM

## 2022-06-11 NOTE — Patient Instructions (Signed)
Vaccines You are taking a medication(s) that can suppress your immune system.  The following immunizations are recommended: Flu annually Covid-19  Td/Tdap (tetanus, diphtheria, pertussis) every 10 years Pneumonia (Prevnar 15 then Pneumovax 23 at least 1 year apart.  Alternatively, can take Prevnar 20 without needing additional dose) Shingrix: 2 doses from 4 weeks to 6 months apart  Please check with your PCP to make sure you are up to date.  

## 2022-06-12 LAB — CBC WITH DIFFERENTIAL/PLATELET
Absolute Monocytes: 539 cells/uL (ref 200–950)
Basophils Absolute: 31 cells/uL (ref 0–200)
Basophils Relative: 0.5 %
Eosinophils Absolute: 143 cells/uL (ref 15–500)
Eosinophils Relative: 2.3 %
HCT: 41.9 % (ref 35.0–45.0)
Hemoglobin: 14.2 g/dL (ref 11.7–15.5)
Lymphs Abs: 1705 cells/uL (ref 850–3900)
MCH: 30.1 pg (ref 27.0–33.0)
MCHC: 33.9 g/dL (ref 32.0–36.0)
MCV: 88.8 fL (ref 80.0–100.0)
MPV: 11 fL (ref 7.5–12.5)
Monocytes Relative: 8.7 %
Neutro Abs: 3782 cells/uL (ref 1500–7800)
Neutrophils Relative %: 61 %
Platelets: 277 10*3/uL (ref 140–400)
RBC: 4.72 10*6/uL (ref 3.80–5.10)
RDW: 12.6 % (ref 11.0–15.0)
Total Lymphocyte: 27.5 %
WBC: 6.2 10*3/uL (ref 3.8–10.8)

## 2022-06-12 LAB — COMPLETE METABOLIC PANEL WITH GFR
AG Ratio: 1.7 (calc) (ref 1.0–2.5)
ALT: 24 U/L (ref 6–29)
AST: 21 U/L (ref 10–35)
Albumin: 4.5 g/dL (ref 3.6–5.1)
Alkaline phosphatase (APISO): 100 U/L (ref 37–153)
BUN: 24 mg/dL (ref 7–25)
CO2: 28 mmol/L (ref 20–32)
Calcium: 10 mg/dL (ref 8.6–10.4)
Chloride: 104 mmol/L (ref 98–110)
Creat: 0.81 mg/dL (ref 0.50–1.05)
Globulin: 2.6 g/dL (calc) (ref 1.9–3.7)
Glucose, Bld: 99 mg/dL (ref 65–99)
Potassium: 4.4 mmol/L (ref 3.5–5.3)
Sodium: 142 mmol/L (ref 135–146)
Total Bilirubin: 0.5 mg/dL (ref 0.2–1.2)
Total Protein: 7.1 g/dL (ref 6.1–8.1)
eGFR: 81 mL/min/{1.73_m2} (ref >=60)

## 2022-06-12 NOTE — Progress Notes (Signed)
CBC and CMP normal

## 2022-07-02 DIAGNOSIS — M79671 Pain in right foot: Secondary | ICD-10-CM | POA: Diagnosis not present

## 2022-07-02 DIAGNOSIS — M19071 Primary osteoarthritis, right ankle and foot: Secondary | ICD-10-CM | POA: Diagnosis not present

## 2022-07-15 ENCOUNTER — Other Ambulatory Visit: Payer: Self-pay | Admitting: *Deleted

## 2022-07-15 DIAGNOSIS — M0609 Rheumatoid arthritis without rheumatoid factor, multiple sites: Secondary | ICD-10-CM

## 2022-07-15 MED ORDER — HYDROXYCHLOROQUINE SULFATE 200 MG PO TABS: ORAL_TABLET | ORAL | 0 refills | Status: DC

## 2022-07-15 NOTE — Telephone Encounter (Signed)
Refill request received via fax from [facility] for PLQ   Next Visit: 11/11/2022  Last Visit: 06/11/2022  Labs: 06/11/2022 CBC and CMP normal.   Eye exam: 10/07/2021 WNL    Current Dose per office note 06/11/2022: Plaquenil 200 mg 1 tablet by mouth twice daily Monday   DX: Rheumatoid arthritis of multiple sites with negative rheumatoid factor   Last Fill: 01/26/2022  Okay to refill Plaquenil?

## 2022-07-30 ENCOUNTER — Other Ambulatory Visit: Payer: Self-pay | Admitting: Internal Medicine

## 2022-07-30 DIAGNOSIS — Z1231 Encounter for screening mammogram for malignant neoplasm of breast: Secondary | ICD-10-CM

## 2022-08-10 ENCOUNTER — Ambulatory Visit: Payer: Medicare PPO | Admitting: Neurology

## 2022-08-10 ENCOUNTER — Encounter: Payer: Self-pay | Admitting: Neurology

## 2022-08-10 VITALS — BP 132/79 | HR 92 | Ht 64.0 in | Wt 198.8 lb

## 2022-08-10 DIAGNOSIS — G4733 Obstructive sleep apnea (adult) (pediatric): Secondary | ICD-10-CM

## 2022-08-10 DIAGNOSIS — M0600 Rheumatoid arthritis without rheumatoid factor, unspecified site: Secondary | ICD-10-CM | POA: Diagnosis not present

## 2022-08-10 DIAGNOSIS — G8929 Other chronic pain: Secondary | ICD-10-CM | POA: Diagnosis not present

## 2022-08-10 DIAGNOSIS — F3341 Major depressive disorder, recurrent, in partial remission: Secondary | ICD-10-CM | POA: Diagnosis not present

## 2022-08-10 NOTE — Patient Instructions (Signed)
Screening for Sleep Apnea  Sleep apnea is a condition in which breathing pauses or becomes shallow during sleep. Sleep apnea screening is a test to determine if you are at risk for sleep apnea. The test includes a series of questions. It will only takes a few minutes. Your health care provider may ask you to have this test in preparation for surgery or as part of a physical exam. What are the symptoms of sleep apnea? Common symptoms of sleep apnea include: Snoring. Waking up often at night. Daytime sleepiness. Pauses in breathing. Choking or gasping during sleep. Irritability. Forgetfulness. Trouble thinking clearly. Depression. Personality changes. Most people with sleep apnea do not know that they have it. What are the advantages of sleep apnea screening? Getting screened for sleep apnea can help: Ensure your safety. It is important for your health care providers to know whether or not you have sleep apnea, especially if you are having surgery or have other long-term (chronic) health conditions. Improve your health and allow you to get a better night's rest. Restful sleep can help you: Have more energy. Lose weight. Improve high blood pressure. Improve diabetes management. Prevent stroke. Prevent car accidents. What happens during the screening? Screening usually includes being asked a list of questions about your sleep quality. Some questions you may be asked include: Do you snore? Is your sleep restless? Do you have daytime sleepiness? Has a partner or spouse told you that you stop breathing during sleep? Have you had trouble concentrating or memory loss? What is your age? What is your neck circumference? To measure your neck, keep your back straight and gently wrap the tape measure around your neck. Put the tape measure at the middle of your neck, between your chin and collarbone. What is your sex assigned at birth? Do you have or are you being treated for high blood  pressure? If your screening test is positive, you are at risk for the condition. Further testing may be needed to confirm a diagnosis of sleep apnea. Where to find more information You can find screening tools online or at your health care clinic. For more information about sleep apnea screening and healthy sleep, visit these websites: Centers for Disease Control and Prevention: http://www.wolf.info/ American Sleep Apnea Association: www.sleepapnea.org Contact a health care provider if: You think that you may have sleep apnea. Summary Sleep apnea screening can help determine if you are at risk for sleep apnea. It is important for your health care providers to know whether or not you have sleep apnea, especially if you are having surgery or have other chronic health conditions. You may be asked to take a screening test for sleep apnea in preparation for surgery or as part of a physical exam. This information is not intended to replace advice given to you by your health care provider. Make sure you discuss any questions you have with your health care provider. Document Revised: 05/17/2020 Document Reviewed: 05/17/2020 Elsevier Patient Education  [place] [facility]: Sleep In this video, you will learn why sleep is an important part of a healthy lifestyle. To view the content, go to this web address: https://pe.elsevier.(307) 355-6285  This video will expire on: 02/25/2024. If you need access to this video following this date, please reach out to the healthcare provider who assigned it to you. This information is not intended to replace advice given to you by your health care provider. Make sure you discuss any questions you have with your health care provider. Elsevier Patient  Education  [place].

## 2022-08-10 NOTE — Progress Notes (Signed)
SLEEP MEDICINE CLINIC    Provider:  Larey Seat, MD  Primary Care Physician:  Unk Pinto, MD [address] [place] [address]     Referring Provider: Unk Pinto, [place] [address] [place],  [place] [address]          Chief Complaint according to patient   Patient presents with:     New Patient (Initial Visit)      CPAP ordered by dr Melford Aase  over 20 years.      HISTORY OF PRESENT ILLNESS:  08-10-2022:  [name] is a 67 y.o. female patient who is seen upon referral on 08/10/2022 from Dr Melford Aase, MD for transfer of sleep medicine Care for a patient on OSA / CPAP care for 2 decades. Her insomnia medications are filled by NP Jones.  .  Chief concern according to patient : " I depend on CPAP, I can't breathe without it- when I forget to take my CPAP I need to sleep elevated , seated and snore loudly".    I have the pleasure of seeing [name] 08/10/22 a right -handed female with a possible sleep disorder.    The patient had the first sleep study in the year 2000 , she thinks - it was on [address] [employer]-     .  [name] is using an autotitration CPAP device by [employer] on factory settings, 5-20 cm water, 3 cm EPR, residual AHI was 2.9/h. her compliance for 90 days was 94% . She  uses a nasal mask- has congestion,  and condensation water- stopped using water. Lots of air leaks. Needs new mask and headgear, HST or attended sleep study to see baseline, which has not been investigated in 20 years.          Sleep relevant medical history: Nocturia 2-3 times on CPAP- dry mouth, bioteen, snoring, no Sleep walking, no ENT procedures- no Tonsillectomy no cervical spine surgery, no allergies.    Family medical /sleep history: Brother is another family member with OSA,  CPAP is not used .  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  and lives in a household with spouse .  Family status is married , with 2 adult children,  6 grandchildren.  A dog is present.chickens outside.  Tobacco use: none .  ETOH use ; none,  Caffeine intake in form of Coffee( mostly decaff) Soda( /) Tea ( /) or energy drinks Exercise in form of gardening, walking - but is SOB a lot. .   Hobbies : chicks.    Sleep habits are as follows: The patient's dinner time is between 5-7 PM. The patient goes to bed at 11 PM and struggles with sleep onset- continues to sleep for 8 hours ( I am restless, fighting with the CPAP) , wakes for several  bathroom breaks, the first time at 2 AM.   The preferred sleep position is supine or laterally-, with the support of 1 pillow. She has arthritis pain in hands-  Dreams are reportedly rare.  The patient wakes up spontaneously at 7.30 , 8  AM is the usual rise time. She reports recently not feeling refreshed or restored in AM, with symptoms such as dry mouth, congested nose, and frequent  morning headaches, and residual fatigue.  Naps are taken frequently, lasting from 10 to 25 minutes and are more refreshing than nocturnal sleep.    Review of Systems:  Out of a complete 14 system review, the patient complains of only the following symptoms, and all other reviewed systems are negative.:  Fatigue, sleepiness , snoring, fragmented sleep, dislodged mask on CPAP  Insomnia, and pain  Nocturia    How likely are you to doze in the following situations: 0 = not likely, 1 = slight chance, 2 = moderate chance, 3 = high chance   Sitting and Reading? Watching Television? Sitting inactive in a public place (theater or meeting)? As a passenger in a car for an hour without a break? Lying down in the afternoon when circumstances permit? Sitting and talking to someone? Sitting quietly after lunch without alcohol? In a car, while stopped for a few minutes in traffic?   Total = 8/ 24 points   FSS endorsed at 31/ 63 points.  GDS 5/ 15 points, mild depression.   Nasal  congestion,morning headaches, dry mouth-   Vertigo with sinusitis.   Social History   Socioeconomic History   Marital status: Married    Spouse name: Not on file   Number of children: Not on file   Years of education: Not on file   Highest education level: Not on file  Occupational History   Not on file  Tobacco Use   Smoking status: Never    Passive exposure: Past   Smokeless tobacco: Never  Vaping Use   Vaping Use: Never used  Substance and Sexual Activity   Alcohol use: No   Drug use: No   Sexual activity: Not on file  Other Topics Concern   Not on file  Social History Narrative   Not on file   Social Determinants of Health   Financial Resource Strain: Not on file  Food Insecurity: Not on file  Transportation Needs: Not on file  Physical Activity: Not on file  Stress: Not on file  Social Connections: Not on file    Family History  Problem Relation Age of Onset   Hypertension Mother    Heart attack Mother    Hypertension Father    COPD Father    Cancer Father        thyroid   CVA Maternal Grandmother 40   Heart attack Maternal Grandfather 88   Deep vein thrombosis Maternal Grandfather    Colon cancer Neg Hx    Esophageal cancer Neg Hx    Stomach cancer Neg Hx     Past Medical History:  Diagnosis Date   Fibromyalgia    Hypertension    S/P total knee arthroplasty, left 01/07/2019   Simple renal cyst 12/12/2019   Incidentally noted on abd [place] 11/2019 - radiologist recommended further characterization with MRI w & w/o contrast   Hypoechoic lesions within the kidney which may simply represent mildly complicated cysts. The largest of these lies in the midportion of the kidney with increased echogenicity within and suggestion of internal color flow which may be related to septation. MRI of the kidneys with and w   Status post total shoulder arthroplasty, left 01/07/2019   Vitamin D deficiency     Past Surgical History:  Procedure Laterality Date   ABDOMINAL  HYSTERECTOMY  2002   Dr. Brien Mates    COLONOSCOPY  2009   JOINT REPLACEMENT     arthroscopy   KNEE ARTHROPLASTY     TOTAL KNEE ARTHROPLASTY Right 04/14/2013   Procedure: RIGHT TOTAL KNEE ARTHROPLASTY AND LEFT KNEE INJECTION ;  Surgeon: Alta Corning, MD;  Location: [facility];  Service: Orthopedics;  Laterality:  Right;   TOTAL KNEE ARTHROPLASTY Left 01/06/2019   Procedure: TOTAL KNEE ARTHROPLASTY;  Surgeon: Dorna Leitz, MD;  Location: [facility];  Service: Orthopedics;  Laterality: Left;     Current Outpatient Medications on File Prior to Visit  Medication Sig Dispense Refill   acetaminophen (TYLENOL) 500 MG tablet Take 500 mg by mouth every 6 (six) hours as needed.     acyclovir (ZOVIRAX) 400 MG tablet Take 1 tablet (400 mg total) by mouth 3 (three) times daily as needed (fever blisters). 21 tablet 1   aspirin EC 81 MG tablet Take 81 mg by mouth daily.     cetirizine (ZYRTEC) 10 MG tablet Take 10 mg by mouth daily.      Cholecalciferol (VITAMIN D PO) Take 8,000 Int'l Units by mouth daily. 2000 units per capsule     CINNAMON PO Take 2,000 mg by mouth daily.     clonazePAM (KLONOPIN) 1 MG tablet Take 1 mg by mouth at bedtime as needed for anxiety.      diclofenac Sodium (VOLTAREN) 1 % GEL Apply topically daily.     glucose blood (ACCU-CHEK AVIVA PLUS) test strip CHECK BLOOD SUGAR 1 TIME DAILY. DX-R73.03 100 each 1   hydrochlorothiazide (HYDRODIURIL) 25 MG tablet TAKE 1 TABLET BY MOUTH DAILY FOR BLOOD PRESSURE AND FLUID RETENTION OR ANKLE SWELLING 90 tablet 3   hydroxychloroquine (PLAQUENIL) 200 MG tablet TAKE 1 TABLET BY MOUTH TWICE DAILY( EVERY TWELVE HOURS) MONDAY TO FRIDAY 120 tablet 0   ibuprofen (ADVIL) 200 MG tablet Take 200 mg by mouth every 6 (six) hours as needed.     Lancets (ACCU-CHEK MULTICLIX) lancets Use to check blood glucose daily 100 each PRN   Lysine 500 MG TABS Take 1,000 mg by mouth daily. Currently taking 2 pill QD     Magnesium 500 MG TABS Take 2,000 mg by mouth daily. Takes 4  tablets daily     Multiple Vitamin (MULTIVITAMIN WITH MINERALS) TABS tablet Take 0.5 tablets by mouth 2 (two) times daily.     ondansetron (ZOFRAN) 4 MG tablet Take 1 tablet (4 mg total) by mouth daily as needed for nausea or vomiting. 30 tablet 1   QUEtiapine (SEROQUEL) 100 MG tablet Take 100 mg by mouth 3 (three) times daily.     rosuvastatin (CRESTOR) 40 MG tablet TAKE 1 TABLET BY MOUTH DAILY FOR CHOLESTEROL 90 tablet 3   vitamin C (ASCORBIC ACID) 500 MG tablet Take 500 mg by mouth 2 (two) times daily.     zinc gluconate 50 MG tablet Take 50 mg by mouth daily.     lamoTRIgine (LAMICTAL) 150 MG tablet Take 1 & 1/2 tablets at bedtime (Patient taking differently: Take 1 tablet at bedtime) 135 tablet 1   No current facility-administered medications on file prior to visit.    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  Lab Results  Component Value Date   WBC 6.2 06/11/2022   HGB 14.2 06/11/2022   HCT 41.9 06/11/2022   MCV 88.8 06/11/2022   PLT 277 06/11/2022      Component Value Date/Time   NA 142 06/11/2022 1010   K 4.4 06/11/2022 1010   CL 104 06/11/2022 1010   CO2 28 06/11/2022 1010   GLUCOSE 99 06/11/2022 1010   BUN 24 06/11/2022 1010   CREATININE 0.81 06/11/2022 1010   CALCIUM 10.0 06/11/2022 1010   PROT 7.1 06/11/2022 1010   ALBUMIN 4.7 01/03/2019 1350   AST 21 06/11/2022 1010   ALT 24 06/11/2022 1010   ALKPHOS 89 01/03/2019 1350   BILITOT 0.5 06/11/2022 1010   GFRNONAA 89 09/02/2020 1048    GFRAA 103 09/02/2020 1048   Lab Results  Component Value Date   CHOL 134 12/17/2021   HDL 51 12/17/2021   LDLCALC 64 12/17/2021   TRIG 102 12/17/2021   CHOLHDL 2.6 12/17/2021   Lab Results  Component Value Date   HGBA1C 5.9 (H) 12/17/2021   Lab Results  Component Value Date   [id number] 04/29/2016   Lab Results  Component Value Date   TSH 1.42 09/15/2021    PHYSICAL EXAM:  Today's Vitals   08/10/22 1057  BP: 132/79  Pulse: 92  Weight: 198 lb 12.8 oz (90.2 kg)  Height: 5' 4"$  (1.626 m)   Body mass index is 34.12 kg/m.   Wt Readings from Last 3 Encounters:  08/10/22 198 lb 12.8 oz (90.2 kg)  06/11/22 199 lb 3.2 oz (90.4 kg)  03/19/22 198 lb 3.2 oz (89.9 kg)     Ht Readings from Last 3 Encounters:  08/10/22 5' 4"$  (1.626 m)  06/11/22 5' 4"$  (1.626 m)  03/19/22 5' 4"$  (1.626 m)      General: The patient is awake, alert and appears not in acute distress. The patient is well groomed. Head: Normocephalic, atraumatic. Neck is supple.  Mallampati 3,  neck circumference:17 inches . Nasal airflow not fully patent.  Retrognathia is not seen.  Dental status: crowded and irregular.  Cardiovascular:  Regular rate and cardiac rhythm by pulse,  without distended neck veins. Respiratory: Lungs are clear to auscultation.  Skin:  Without evidence of ankle edema, or rash. Trunk: The patient's posture is erect.   NEUROLOGIC EXAM: The patient is awake and alert, oriented to place and time.   Memory subjective described as intact.  Attention span & concentration ability appears normal.  Speech is fluent,  without  dysarthria, dysphonia or aphasia.  Mood and affect are appropriate.   Cranial nerves: no loss of smell or taste reported  Pupils are equal and briskly reactive to light. Funduscopic exam deferred. .  Extraocular movements in vertical and horizontal planes were intact and without nystagmus. No Diplopia. Visual fields by finger perimetry are intact. Hearing was  intact to soft voice and finger rubbing.    Facial sensation intact to fine touch.  Facial motor strength is symmetric and tongue and uvula move midline.  Neck ROM : rotation, tilt and flexion extension were normal for age and shoulder shrug was symmetrical.    Motor exam:  Symmetric bulk, tone and ROM.   Normal tone without cog wheeling, symmetric grip strength .   Sensory:  Fine touch, pinprick and vibration were tested  and  normal.  Proprioception tested in the upper extremities was normal.   Coordination: Rapid alternating movements in the fingers/hands were of normal speed.  The Finger-to-nose maneuver  was intact without evidence of ataxia, dysmetria or tremor.   Gait and station: Patient could rise unassisted from a seated position, walked without assistive device.  Stance is of normal width/ base and the patient turned with 3 steps.  Toe and heel walk were deferred.  Deep tendon reflexes: in the  upper and lower extremities are symmetric and intact.  Babinski response was deferred .    ASSESSMENT AND PLAN 67 y.o. year old female  here with:    1) OSA on CPAP ; She  uses a nasal mask- has congestion,  and condensation water- stopped using water. Lots of air leaks. Needs new mask and headgear, HST or attended sleep study to see baseline, which has not been investigated in 20 years.  BMI and airway anatomy are risk factors, as is mouth breathing. Gained weight since she had this last CPAP issued.   2) The patient has chronic insomnia., anxiety related, treated by Pauline Good, NP. Marland Kitchen   3) rhinitis vasomotor, morning headaches and nocturia, all while on CPAP. Can't sleep without CPAP either, needs to be in a recliner. Loud snoring.   Plan : SPLIT night PSG at [facility] 10/ h ,  Plan B: HST for new baseline.  DME will then need to refit for a mask.  Chin strap should be ordered.  She is anxious and has insomnia, but air leak didn't help.   I plan to follow up either personally or  through our NP within 4-5 months.   I would like to thank Unk Pinto, MD and Unk Pinto, [place] [address] [place],  [place] [address] for allowing me to meet with and to take care of this pleasant patient.   CC: I will share my notes with Dr Melford Aase. I will scan her 90 days CPAP compliance to her  visit note in [facility]>   After spending a total time of  45  minutes face to face and additional time for physical and neurologic examination, review of laboratory studies,  personal review of imaging studies, reports and results of other testing and review of referral information / records as far as provided in visit,   Electronically signed by: Larey Seat, MD 08/10/2022 11:22 AM  [facility] Board certified by The [employer] of Sleep Medicine and Diplomate of the [employer] of Sleep Medicine. Board certified In Neurology through the [place], Fellow of the [employer] of Neurology. Medical Director of [facility].

## 2022-08-27 ENCOUNTER — Telehealth: Payer: Self-pay | Admitting: Neurology

## 2022-08-27 NOTE — Telephone Encounter (Signed)
SPLIT- [name]: [id number] (exp. 08/27/22 to 11/25/22   Patient is scheduled for 10/26/22 at 9 pm.  Mailed packet to the patient.

## 2022-09-15 ENCOUNTER — Encounter: Payer: Self-pay | Admitting: Internal Medicine

## 2022-09-15 ENCOUNTER — Ambulatory Visit
Admission: RE | Admit: 2022-09-15 | Discharge: 2022-09-15 | Disposition: A | Discharge status: Home / Self Care | Payer: Medicare PPO | Source: Ambulatory Visit | Attending: Internal Medicine | Admitting: Internal Medicine

## 2022-09-15 DIAGNOSIS — Z1231 Encounter for screening mammogram for malignant neoplasm of breast: Secondary | ICD-10-CM

## 2022-09-15 NOTE — Progress Notes (Unsigned)
Annual Screening/Preventative Visit & Comprehensive Evaluation &  Examination  Future Appointments  Date Time Provider Department  09/16/2022                  cpe 10:00 AM Unk Pinto, MD [facility]  11/11/2022  9:40 AM Ofilia Neas, PA-C [facility]  12/23/2022                    wellness 11:00 AM Darrol Jump, NP [facility]           This very nice 67 y.o. MWF presents for a Screening /Preventative Visit & comprehensive evaluation and management of multiple medical co-morbidities.  Patient has been followed for HTN, HLD, Prediabetes  and Vitamin D Deficiency. Patient has been on CPAP for OSA since 2004 and reports improved sleep hygiene.                                          [[ Patient has been on SS Disability since age 72 for hx/o Panic / Anxiety & Depression. ]]         HTN predates since  2004. Patient's BP has been controlled at home and patient denies any cardiac symptoms as chest pain, palpitations, shortness of breath, dizziness or ankle swelling. Today's BP is at goal -  112/78          Patient's hyperlipidemia is controlled with diet and rosuvastatin. Patient denies myalgias or other medication SE's. Last lipids were at goal :  Lab Results  Component Value Date   CHOL 134 12/17/2021   HDL 51 12/17/2021   LDLCALC 64 12/17/2021   TRIG 102 12/17/2021   CHOLHDL 2.6 12/17/2021         Patient has Morbid Obesity (BMI 33+) and hx/o prediabetes (A1c 5.7% / 2010) and patient denies reactive hypoglycemic symptoms, visual blurring, diabetic polys or paresthesias. Last A1c was near goal :  Lab Results  Component Value Date   HGBA1C 5.9 (H) 12/17/2021         Finally, patient has history of Vitamin D Deficiency a ("23" /2008)  and last Vitamin D was at goal :  Lab Results  Component Value Date   VD25OH 70 09/15/2021      Current Outpatient Medications on File Prior to Visit  Medication Sig   acetaminophen (TYLENOL) 500 MG tablet Take 500 mg by mouth every 6  (six) hours as needed.   acyclovir (ZOVIRAX) 400 MG tablet Take 1 tablet 3 times daily as needed (fever blisters).   aspirin EC 81 MG tablet Take  daily.   cetirizine 10 MG tablet Take 10 mg by mouth daily.    Vitamin D 2,000 u caps  Take 8,000 Int'l Units daily.    CINNAMON  2,000 mg Take  daily.   clonazePAM  1 MG tablet Take 1 mg at bedtime as needed for anxiety.    diclofenac 1 % GEL Apply topically daily.   hydrochlorothiazide 25 MG tablet TAKE 1 TABLET DAILY    hydroxychloroquine 200 MG tablet TAKE 1 TABLET TWICE DAILY ON MONDAY THROUGH FRIDAY ONLY   lamoTRIgine (LAMICTAL) 150 MG tablet Take 1 tablet at bedtime   Lysine 500 MG TABS Take 1,000 mg  daily   Magnesium 500 MG TABS Take 2,500 m daily. Takes 4 tablets daily   Multiple Vitamin  Take 0.5 tablets 2  times  daily.   QUEtiapine 100 MG tablet Take 3 times daily.   rosuvastatin  40 MG tablet Take 1 tablet Daily    vitamin C 500 MG tablet Take 2  times daily.   zinc gluconate 50 MG tablet Take daily.     Allergies  Allergen Reactions   Latex     Band aids= if left on for extended period of time   Celecoxib     REACTION: Rash (celebrex)   Cephalexin     REACTION: Rash (keflex)   Codeine     REACTION: Rash   Erythromycin     REACTION: Rash (emycin)   Sulfonamide Derivatives     REACTION: Rash   Trazodone And Nefazodone     insomnia   Venlafaxine     REACTION: Blurred Vision Insurance claims handler)     Past Medical History:  Diagnosis Date   Fibromyalgia    Hypertension    S/P total knee arthroplasty, left 01/07/2019   Simple renal cyst 12/12/2019   Incidentally noted on abd [place] 11/2019 - radiologist recommended further characterization with MRI w & w/o contrast   Hypoechoic lesions within the kidney which may simply represent mildly complicated cysts. The largest of these lies in the midportion of the kidney with increased echogenicity within and suggestion of internal color flow which may be related to septation. MRI of the  kidneys with and w   Status post total shoulder arthroplasty, left 01/07/2019   Vitamin D deficiency      Health Maintenance  Topic Date Due   Zoster Vaccines- Shingrix (1 of 2) Never done   COVID-19 Vaccine (4 - Booster for Pfizer series) 10/08/2020   INFLUENZA VACCINE  01/20/2021   MAMMOGRAM  09/04/2022   DEXA SCAN  09/04/2023   TETANUS/TDAP  07/04/2025   COLONOSCOPY (Pts 45-70yrs Insurance coverage will need to be confirmed)  12/02/2027   Hepatitis C Screening  Completed   HIV Screening  Completed   HPV VACCINES  Aged Out   PAP SMEAR-Modifier  Discontinued     Immunization History  Administered Date(s) Administered   DT (Pediatric) 07/05/2015   Influenza Inj Mdck Quad  05/28/2017, 04/13/2018   Influenza Split 03/21/2015   Influenza,inj,Quad P 03/16/2019   Influenza,inj,quad 04/29/2016   PFIZER SARS-COV-2 Vacc 11/08/2019, 11/29/2019, 08/13/2020   Pneumococcal -13 05/16/2014   Pneumococcal-23 06/22/1998   Td 06/22/2004   Zoster, Live 10/21/2015    Last Colon -  12/01/2017 - Dr Ardis Hughs recc 10 yr f/u due June 2029   Last MGM - 09/03/2021   Past Surgical History:  Procedure Laterality Date   ABDOMINAL HYSTERECTOMY  2002   Dr. Brien Mates    COLONOSCOPY  2009   JOINT REPLACEMENT     arthroscopy   KNEE ARTHROPLASTY     TOTAL KNEE ARTHROPLASTY Right 04/14/2013   RIGHT TOTAL KNEE ARTHROPLASTY AND LEFT KNEE INJECTION ;   Alta Corning, MD   TOTAL KNEE ARTHROPLASTY Left 01/06/2019   Procedure: TOTAL KNEE ARTHROPLASTY;  Surgeon: Dorna Leitz, Jerilynn Mages     Family History  Problem Relation Age of Onset   Hypertension Mother    Heart attack Mother    Hypertension Father    COPD Father    Cancer Father        thyroid   CVA Maternal Grandmother 35   Heart attack Maternal Grandfather 59   Deep vein thrombosis Maternal Grandfather    Colon cancer Neg Hx    Esophageal cancer Neg Hx    Stomach  cancer Neg Hx      Social History   Tobacco Use   Smoking status: Never    Smokeless tobacco: Never  Vaping Use   Vaping Use: Never used  Substance Use Topics   Alcohol use: No   Drug use: No      ROS Constitutional: Denies fever, chills, weight loss/gain, headaches, insomnia,  night sweats, and change in appetite. Does c/o fatigue. Eyes: Denies redness, blurred vision, diplopia, discharge, itchy, watery eyes.  ENT: Denies discharge, congestion, post nasal drip, epistaxis, sore throat, earache, hearing loss, dental pain, Tinnitus, Vertigo, Sinus pain, snoring.  Cardio: Denies chest pain, palpitations, irregular heartbeat, syncope, dyspnea, diaphoresis, orthopnea, PND, claudication, edema Respiratory: denies cough, dyspnea, DOE, pleurisy, hoarseness, laryngitis, wheezing.  Gastrointestinal: Denies dysphagia, heartburn, reflux, water brash, pain, cramps, nausea, vomiting, bloating, diarrhea, constipation, hematemesis, melena, hematochezia, jaundice, hemorrhoids Genitourinary: Denies dysuria, frequency, urgency, nocturia, hesitancy, discharge, hematuria, flank pain Breast: Breast lumps, nipple discharge, bleeding.  Musculoskeletal: Denies arthralgia, myalgia, stiffness, Jt. Swelling, pain, limp, and strain/sprain. Denies falls. Skin: Denies puritis, rash, hives, warts, acne, eczema, changing in skin lesion Neuro: No weakness, tremor, incoordination, spasms, paresthesia, pain Psychiatric: Denies confusion, memory loss, sensory loss. Denies Depression. Endocrine: Denies change in weight, skin, hair change, nocturia, and paresthesia, diabetic polys, visual blurring, hyper / hypo glycemic episodes.  Heme/Lymph: No excessive bleeding, bruising, enlarged lymph nodes.  Physical Exam  BP 112/78   Pulse 79   Temp 97.7 F (36.5 C)   Resp 16   Ht 5\' 4"  (1.626 m)   Wt 198 lb (89.8 kg)   SpO2 97%   BMI 33.99 kg/m    General Appearance: Over nourished, well groomed and in no apparent distress.  Eyes: PERRLA, EOMs, conjunctiva no swelling or erythema, normal fundi  and vessels. Sinuses: No frontal/maxillary tenderness ENT/Mouth: EACs patent / TMs  nl. Nares clear without erythema, swelling, mucoid exudates. Oral hygiene is good. No erythema, swelling, or exudate. Tongue normal, non-obstructing. Tonsils not swollen or erythematous. Hearing normal.  Neck: Supple, thyroid not palpable. No bruits, nodes or JVD. Respiratory: Respiratory effort normal.  BS equal and clear bilateral without rales, rhonci, wheezing or stridor. Cardio: Heart sounds are normal with regular rate and rhythm and no murmurs, rubs or gallops. Peripheral pulses are normal and equal bilaterally without edema. No aortic or femoral bruits. Chest: symmetric with normal excursions and percussion. Breasts: Symmetric, without lumps, nipple discharge, retractions, or fibrocystic changes.  Abdomen: Flat, soft with bowel sounds active. Nontender, no guarding, rebound, hernias, masses, or organomegaly.  Lymphatics: Non tender without lymphadenopathy.  Musculoskeletal: Full ROM all peripheral extremities, joint stability, 5/5 strength, and normal gait. Skin: Warm and dry without rashes, lesions, cyanosis, clubbing or  ecchymosis.  Neuro: Cranial nerves intact, reflexes equal bilaterally. Normal muscle tone, no cerebellar symptoms. Sensation intact.  Pysch: Alert and oriented x 3, normal affect, Insight and Judgment appropriate.    Assessment and Plan  1. Annual Preventative Screening Examination   2. Essential hypertension  - EKG 12-Lead - Urinalysis, Routine w reflex microscopic - Microalbumin / creatinine urine ratio - CBC with Differential/Platelet - COMPLETE METABOLIC PANEL WITH GFR - Magnesium - TSH  3. Hyperlipemia, mixed  - EKG 12-Lead - Lipid panel - TSH  4. Abnormal glucose  - EKG 12-Lead - Hemoglobin A1c - Insulin, random  5. Vitamin D deficiency   6. Morbid obesity ([place]) - BMI 30+ with OSA  - TSH  7. OSA on CPAP   8. Screening for colorectal  cancer  - POC  Hemoccult Bld/Stl  9. Screening for heart disease  - EKG 12-Lead  10. FH: hypertension  - EKG 12-Lead  11. Medication management  - Urinalysis, Routine w reflex microscopic - CBC with Differential/Platelet - COMPLETE METABOLIC PANEL WITH GFR - Magnesium - Lipid panel - TSH - Hemoglobin A1c - Insulin, random - VITAMIN D 25 Hydroxy                                                             Patient  was counseled in prudent diet to achieve/maintain BMI less than 25 for weight control, BP monitoring, regular exercise and medications. Discussed med's effects and SE's. Screening labs and tests as requested with regular follow-up as recommended. Over 40 minutes of exam, counseling, chart review and high complex critical decision making was performed.   Kirtland Bouchard, MD

## 2022-09-15 NOTE — Patient Instructions (Signed)

## 2022-09-16 ENCOUNTER — Encounter: Payer: Self-pay | Admitting: Internal Medicine

## 2022-09-16 ENCOUNTER — Ambulatory Visit: Payer: Medicare PPO | Admitting: Internal Medicine

## 2022-09-16 VITALS — BP 112/78 | HR 79 | Temp 97.7°F | Resp 16 | Ht 64.0 in | Wt 198.0 lb

## 2022-09-16 DIAGNOSIS — R42 Dizziness and giddiness: Secondary | ICD-10-CM

## 2022-09-16 DIAGNOSIS — Z1211 Encounter for screening for malignant neoplasm of colon: Secondary | ICD-10-CM

## 2022-09-16 DIAGNOSIS — E559 Vitamin D deficiency, unspecified: Secondary | ICD-10-CM

## 2022-09-16 DIAGNOSIS — I10 Essential (primary) hypertension: Secondary | ICD-10-CM

## 2022-09-16 DIAGNOSIS — Z136 Encounter for screening for cardiovascular disorders: Secondary | ICD-10-CM | POA: Diagnosis not present

## 2022-09-16 DIAGNOSIS — Z0000 Encounter for general adult medical examination without abnormal findings: Secondary | ICD-10-CM

## 2022-09-16 DIAGNOSIS — E782 Mixed hyperlipidemia: Secondary | ICD-10-CM | POA: Diagnosis not present

## 2022-09-16 DIAGNOSIS — R7309 Other abnormal glucose: Secondary | ICD-10-CM

## 2022-09-16 DIAGNOSIS — Z79899 Other long term (current) drug therapy: Secondary | ICD-10-CM | POA: Diagnosis not present

## 2022-09-16 DIAGNOSIS — Z8249 Family history of ischemic heart disease and other diseases of the circulatory system: Secondary | ICD-10-CM

## 2022-09-16 DIAGNOSIS — Z0001 Encounter for general adult medical examination with abnormal findings: Secondary | ICD-10-CM

## 2022-09-16 DIAGNOSIS — B009 Herpesviral infection, unspecified: Secondary | ICD-10-CM

## 2022-09-16 DIAGNOSIS — G4733 Obstructive sleep apnea (adult) (pediatric): Secondary | ICD-10-CM

## 2022-09-16 MED ORDER — ACYCLOVIR 400 MG PO TABS: 400.0000 mg | ORAL_TABLET | Freq: Three times a day (TID) | ORAL | 1 refills | Status: DC | PRN

## 2022-09-16 MED ORDER — ACCU-CHEK MULTICLIX LANCETS MISC: 99 refills | Status: AC

## 2022-09-16 MED ORDER — ONDANSETRON HCL 4 MG PO TABS: 4.0000 mg | ORAL_TABLET | Freq: Every day | ORAL | 1 refills | Status: AC | PRN

## 2022-09-16 MED ORDER — ACCU-CHEK AVIVA PLUS VI STRP: ORAL_STRIP | 1 refills | Status: DC

## 2022-09-17 ENCOUNTER — Ambulatory Visit: Payer: Medicare PPO

## 2022-09-17 LAB — INSULIN, RANDOM: Insulin: 34.8 u[IU]/mL — ABNORMAL HIGH

## 2022-09-17 LAB — LIPID PANEL
Cholesterol: 126 mg/dL (ref <200)
HDL: 52 mg/dL (ref >=50)
LDL Cholesterol (Calc): 52 mg/dL (calc)
Non-HDL Cholesterol (Calc): 74 mg/dL (calc) (ref <130)
Total CHOL/HDL Ratio: 2.4 (calc) (ref <5.0)
Triglycerides: 135 mg/dL (ref <150)

## 2022-09-17 LAB — URINALYSIS, ROUTINE W REFLEX MICROSCOPIC
Bacteria, UA: NONE SEEN /HPF
Bilirubin Urine: NEGATIVE
Glucose, UA: NEGATIVE
Hgb urine dipstick: NEGATIVE
Hyaline Cast: NONE SEEN /LPF
Ketones, ur: NEGATIVE
Leukocytes,Ua: NEGATIVE
Nitrite: NEGATIVE
Specific Gravity, Urine: 1.022 (ref 1.001–1.035)
WBC, UA: NONE SEEN /HPF (ref 0–5)
pH: 7.5 (ref 5.0–8.0)

## 2022-09-17 LAB — TSH: TSH: 1.51 mIU/L (ref 0.40–4.50)

## 2022-09-17 LAB — CBC WITH DIFFERENTIAL/PLATELET
Absolute Monocytes: 455 cells/uL (ref 200–950)
Basophils Absolute: 40 cells/uL (ref 0–200)
Basophils Relative: 0.8 %
Eosinophils Absolute: 170 cells/uL (ref 15–500)
Eosinophils Relative: 3.4 %
HCT: 41.5 % (ref 35.0–45.0)
Hemoglobin: 13.7 g/dL (ref 11.7–15.5)
Lymphs Abs: 1635 cells/uL (ref 850–3900)
MCH: 29.7 pg (ref 27.0–33.0)
MCHC: 33 g/dL (ref 32.0–36.0)
MCV: 89.8 fL (ref 80.0–100.0)
MPV: 10.6 fL (ref 7.5–12.5)
Monocytes Relative: 9.1 %
Neutro Abs: 2700 cells/uL (ref 1500–7800)
Neutrophils Relative %: 54 %
Platelets: 306 10*3/uL (ref 140–400)
RBC: 4.62 10*6/uL (ref 3.80–5.10)
RDW: 13.2 % (ref 11.0–15.0)
Total Lymphocyte: 32.7 %
WBC: 5 10*3/uL (ref 3.8–10.8)

## 2022-09-17 LAB — COMPLETE METABOLIC PANEL WITH GFR
AG Ratio: 1.5 (calc) (ref 1.0–2.5)
ALT: 31 U/L — ABNORMAL HIGH (ref 6–29)
AST: 27 U/L (ref 10–35)
Albumin: 4.4 g/dL (ref 3.6–5.1)
Alkaline phosphatase (APISO): 92 U/L (ref 37–153)
BUN: 18 mg/dL (ref 7–25)
CO2: 26 mmol/L (ref 20–32)
Calcium: 9.5 mg/dL (ref 8.6–10.4)
Chloride: 105 mmol/L (ref 98–110)
Creat: 0.77 mg/dL (ref 0.50–1.05)
Globulin: 2.9 g/dL (calc) (ref 1.9–3.7)
Glucose, Bld: 112 mg/dL — ABNORMAL HIGH (ref 65–99)
Potassium: 3.9 mmol/L (ref 3.5–5.3)
Sodium: 144 mmol/L (ref 135–146)
Total Bilirubin: 0.6 mg/dL (ref 0.2–1.2)
Total Protein: 7.3 g/dL (ref 6.1–8.1)
eGFR: 86 mL/min/{1.73_m2} (ref >=60)

## 2022-09-17 LAB — HEMOGLOBIN A1C
Hgb A1c MFr Bld: 6.1 % of total Hgb — ABNORMAL HIGH (ref <5.7)
Mean Plasma Glucose: 128 mg/dL
eAG (mmol/L): 7.1 mmol/L

## 2022-09-17 LAB — MICROALBUMIN / CREATININE URINE RATIO
Creatinine, Urine: 110 mg/dL (ref 20–275)
Microalb Creat Ratio: 3 mg/g creat (ref <30)
Microalb, Ur: 0.3 mg/dL

## 2022-09-17 LAB — MICROSCOPIC MESSAGE

## 2022-09-17 LAB — MAGNESIUM: Magnesium: 2.2 mg/dL (ref 1.5–2.5)

## 2022-09-17 LAB — VITAMIN D 25 HYDROXY (VIT D DEFICIENCY, FRACTURES): Vit D, 25-Hydroxy: 82 ng/mL (ref 30–100)

## 2022-09-20 NOTE — Progress Notes (Signed)
<><><><><><><><><><><><><><><><><><><><><><><><><><><><><><><><><> <><><><><><><><><><><><><><><><><><><><><><><><><><><><><><><><><> -   Test results slightly outside the reference range are not unusual. If there is anything important, I will review this with you,  otherwise it is considered normal test values.  If you have further questions,  please do not hesitate to contact me at the office or via My Chart.  <><><><><><><><><><><><><><><><><><><><><><><><><><><><><><><><><> <><><><><><><><><><><><><><><><><><><><><><><><><><><><><><><><><>  -  A1c is up slightly to 6.1%    and still elevated  in the borderline and                                                        early or pre-diabetes range which has the same   300% increased risk for heart attack, stroke, cancer and                                           alzheimer- type vascular dementia as full blown diabetes.   But the good news is that diet, exercise with weight loss can                                                                              cure the early diabetes at this point. <><><><><><><><><><><><><><><><><><><><><><><><><><><><><><><><><> <><><><><><><><><><><><><><><><><><><><><><><><><><><><><><><><><>  -  Total Chol = 126   &  LDL Chol = 52 - Both  Excellent   - Very low risk for Heart Attack  / Stroke <><><><><><><><><><><><><><><><><><><><><><><><><><><><><><><><><> <><><><><><><><><><><><><><><><><><><><><><><><><><><><><><><><><>  - Vitamin 82 - Excellent - Please continue dose same  <><><><><><><><><><><><><><><><><><><><><><><><><><><><><><><><><>  - All Else - CBC - Kidneys - Electrolytes - Liver - Magnesium & Thyroid    - all  Normal / OK <><><><><><><><><><><><><><><><><><><><><><><><><><><><><><><><><> <><><><><><><><><><><><><><><><><><><><><><><><><><><><><><><><><>

## 2022-10-08 ENCOUNTER — Other Ambulatory Visit: Payer: Self-pay | Admitting: Physician Assistant

## 2022-10-08 DIAGNOSIS — M0609 Rheumatoid arthritis without rheumatoid factor, multiple sites: Secondary | ICD-10-CM

## 2022-10-08 NOTE — Telephone Encounter (Signed)
Last Fill: 07/15/2022  Eye exam: 10/07/2021 WNL    Labs: 09/16/2022 Glucose 112, ALT 31,   Next Visit: 11/11/2022  Last Visit: 06/11/2022  DX :Rheumatoid arthritis of multiple sites with negative rheumatoid factor   Current Dose per office note 06/11/2022: Plaquenil 200 mg 1 tablet by mouth twice daily Monday to Friday   Patient advised she is due to update her PLQ eye exam. Patient states she has her eye exam scheduled for 11/13/2022.   Okay to refill Plaquenil?

## 2022-10-22 ENCOUNTER — Other Ambulatory Visit: Payer: Self-pay

## 2022-10-22 DIAGNOSIS — Z1211 Encounter for screening for malignant neoplasm of colon: Secondary | ICD-10-CM

## 2022-10-22 DIAGNOSIS — Z1212 Encounter for screening for malignant neoplasm of rectum: Secondary | ICD-10-CM | POA: Diagnosis not present

## 2022-10-22 LAB — POC HEMOCCULT BLD/STL (HOME/3-CARD/SCREEN)
Card #2 Fecal Occult Blod, POC: NEGATIVE
Card #3 Fecal Occult Blood, POC: NEGATIVE
Fecal Occult Blood, POC: NEGATIVE

## 2022-10-26 ENCOUNTER — Ambulatory Visit (INDEPENDENT_AMBULATORY_CARE_PROVIDER_SITE_OTHER): Payer: Medicare PPO | Admitting: Neurology

## 2022-10-26 DIAGNOSIS — G4733 Obstructive sleep apnea (adult) (pediatric): Secondary | ICD-10-CM | POA: Diagnosis not present

## 2022-10-26 DIAGNOSIS — G8929 Other chronic pain: Secondary | ICD-10-CM

## 2022-10-26 DIAGNOSIS — F3341 Major depressive disorder, recurrent, in partial remission: Secondary | ICD-10-CM

## 2022-10-26 DIAGNOSIS — M0600 Rheumatoid arthritis without rheumatoid factor, unspecified site: Secondary | ICD-10-CM

## 2022-10-26 DIAGNOSIS — Z9989 Dependence on other enabling machines and devices: Secondary | ICD-10-CM

## 2022-10-28 NOTE — Progress Notes (Unsigned)
Office Visit Note  Patient: [name]             Date of Birth: [birth year]           MRN: [id number]             PCP: Lucky Cowboy, MD Referring: Lucky Cowboy, MD Visit Date: 11/11/2022 Occupation: @GUAROCC @  Subjective:  Pain in both hands   History of Present Illness: [name] is a 67 y.o. female with history of seronegative rheumatoid arthritis and osteoarthritis.  Patient is taking Plaquenil 200 mg 1 tablet by mouth twice daily Monday to Friday.  She is tolerating Plaquenil without any side effects and has not missed any doses recently.  She continues to have chronic pain and stiffness in both hands due to underlying osteoarthritis.  She has been experiencing nocturnal pain in her hands.  She has tried arthritis compression gloves in the past.  She tries to avoid taking over-the-counter products for pain relief due to the concern for long-term risks.  She continues to have chronic pain in the right foot due to a dorsal spur.  She was evaluated by a podiatrist in the past and had 2 cortisone injections which righted temporary relief but her symptoms have returned.  She is not ready to proceed with surgery at this time.  She denies any other joint pain or joint swelling at this time.   Activities of Daily Living:  Patient reports morning stiffness for several hours.   Patient Reports nocturnal pain.  Difficulty dressing/grooming: Denies Difficulty climbing stairs: Reports Difficulty getting out of chair: Reports Difficulty using hands for taps, buttons, cutlery, and/or writing: Reports  Review of Systems  Constitutional:  Positive for fatigue.  HENT:  Positive for mouth dryness. Negative for mouth sores and nose dryness.   Eyes:  Positive for dryness. Negative for pain and visual disturbance.  Respiratory:  Negative for cough, hemoptysis, shortness of breath and difficulty breathing.   Cardiovascular:  Negative for chest pain, palpitations, hypertension and  swelling in legs/feet.  Gastrointestinal:  Negative for blood in stool, constipation and diarrhea.  Endocrine: Negative for increased urination.  Genitourinary:  Positive for difficulty urinating. Negative for painful urination and involuntary urination.  Musculoskeletal:  Positive for joint pain, gait problem, joint pain, joint swelling and morning stiffness. Negative for myalgias, muscle weakness, muscle tenderness and myalgias.  Skin:  Negative for color change, pallor, rash, hair loss, nodules/bumps, skin tightness, ulcers and sensitivity to sunlight.  Allergic/Immunologic: Negative for susceptible to infections.  Neurological:  Positive for headaches. Negative for dizziness, numbness and weakness.  Hematological:  Negative for swollen glands.  Psychiatric/Behavioral:  Positive for sleep disturbance. Negative for depressed mood. The patient is nervous/anxious.     PMFS History:  Patient Active Problem List   Diagnosis Date Noted   Osteopenia 09/03/2021   Seronegative rheumatoid arthritis (HCC) 03/12/2021   Hyperlipidemia 03/11/2021   Fatty liver 12/12/2019   Other abnormal glucose (prediabetes) 05/23/2019   Osteoarthritis of both knees 03/20/2015   OSA on CPAP 08/15/2014   Other chronic pain 01/31/2014   Depression, major, recurrent, in partial remission (HCC) 01/31/2014   Morbid obesity (HCC) - BMI 30+ with OSA 01/31/2014   Essential hypertension 08/09/2013   Vitamin D deficiency 08/09/2013    Past Medical History:  Diagnosis Date   Fibromyalgia    Hypertension    S/P total knee arthroplasty, left 01/07/2019   Simple renal cyst 12/12/2019   Incidentally noted on abd [place] 11/2019 -  radiologist recommended further characterization with MRI w & w/o contrast   Hypoechoic lesions within the kidney which may simply represent mildly complicated cysts. The largest of these lies in the midportion of the kidney with increased echogenicity within and suggestion of internal color flow which may  be related to septation. MRI of the kidneys with and w   Status post total shoulder arthroplasty, left 01/07/2019   Vitamin D deficiency     Family History  Problem Relation Age of Onset   Hypertension Mother    Heart attack Mother    Hypertension Father    COPD Father    Cancer Father        thyroid   CVA Maternal Grandmother 57   Heart attack Maternal Grandfather 59   Deep vein thrombosis Maternal Grandfather    Colon cancer Neg Hx    Esophageal cancer Neg Hx    Stomach cancer Neg Hx    Past Surgical History:  Procedure Laterality Date   ABDOMINAL HYSTERECTOMY  2002   Dr. Chari Manning    COLONOSCOPY  2009   JOINT REPLACEMENT     arthroscopy   KNEE ARTHROPLASTY     TOTAL KNEE ARTHROPLASTY Right 04/14/2013   Procedure: RIGHT TOTAL KNEE ARTHROPLASTY AND LEFT KNEE INJECTION ;  Surgeon: Harvie Junior, MD;  Location: [facility] OR;  Service: Orthopedics;  Laterality: Right;   TOTAL KNEE ARTHROPLASTY Left 01/06/2019   Procedure: TOTAL KNEE ARTHROPLASTY;  Surgeon: Jodi Geralds, MD;  Location: [facility];  Service: Orthopedics;  Laterality: Left;   Social History   Social History Narrative   Not on file   Immunization History  Administered Date(s) Administered   DT (Pediatric) 07/05/2015   Influenza Inj Mdck Quad With Preservative 05/28/2017, 04/13/2018   Influenza Split 04/06/2012, 04/05/2013, 05/16/2014, 03/21/2015   Influenza,inj,Quad PF,6+ Mos 03/16/2019   Influenza,inj,quad, With Preservative 04/29/2016   PFIZER(Purple Top)SARS-COV-2 Vaccination 11/08/2019, 11/29/2019, 08/13/2020   Pneumococcal Conjugate-13 05/16/2014   Pneumococcal-Unspecified 06/22/1998   Td 06/22/2004   Zoster, Live 10/21/2015     Objective: Vital Signs: BP 109/66 (Patient Position: Sitting, Cuff Size: Normal)   Pulse 76   Resp 15   Ht 5\' 4"  (1.626 m)   Wt 197 lb (89.4 kg)   BMI 33.81 kg/m    Physical Exam Vitals and nursing note reviewed.  Constitutional:      Appearance: She is well-developed.  HENT:      Head: Normocephalic and atraumatic.  Eyes:     Conjunctiva/sclera: Conjunctivae normal.  Cardiovascular:     Rate and Rhythm: Normal rate and regular rhythm.     Heart sounds: Normal heart sounds.  Pulmonary:     Effort: Pulmonary effort is normal.     Breath sounds: Normal breath sounds.  Abdominal:     General: Bowel sounds are normal.     Palpations: Abdomen is soft.  Musculoskeletal:     Cervical back: Normal range of motion.  Lymphadenopathy:     Cervical: No cervical adenopathy.  Skin:    General: Skin is warm and dry.     Capillary Refill: Capillary refill takes less than 2 seconds.  Neurological:     Mental Status: She is alert and oriented to person, place, and time.  Psychiatric:        Behavior: Behavior normal.      Musculoskeletal Exam: C-spine has good range of motion.  Some trapezius muscle tension tenderness bilaterally.  Shoulder joints, elbow joints, wrist joints, MCPs, PIPs, DIPs have good range  of motion with no synovitis.  PIP and DIP thickening consistent with osteoarthritis of both hands.  No tenderness or synovitis over MCP joints.  Complete fist formation bilaterally.  Hip joints have good range of motion with no groin pain.  Bilateral knee replacements have good range of motion with no effusion.  Ankle joints have good range of motion with no joint tenderness or synovitis.  Dorsal spur noted on the right foot was tender and inflamed.  No tenderness or synovitis of MTP joints.   CDAI Exam: CDAI Score: -- Patient Global: 2 mm; Provider Global: 2 mm Swollen: --; Tender: -- Joint Exam 11/11/2022   No joint exam has been documented for this visit   There is currently no information documented on the homunculus. Go to the Rheumatology activity and complete the homunculus joint exam.  Investigation: No additional findings.  Imaging: Split night study  Result Date: 10/26/2022 Melvyn Novas, MD     11/06/2022  6:02 PM [facility] SPLIT NIGHT INTERPRETATION REPORT STUDY DATE: 10/26/2022  PATIENT NAME:  [name], [name]        DATE OF BIRTH:  1955-12-26 PATIENT ID:  [id number]  Oneta Rack, MD   TYPE OF STUDY:  SPLIT READING PHYSICIAN: Melvyn Novas,  SCORING TECHNICIAN: Margaretann Loveless 08-10-2022: [name] is a 67 y.o. female referred by PCP, Dr Oneta Rack, MD for transfer of Sleep Medicine Care.This patient has been diagnosed over 20 years ago with OSA and has been on CPAP therapy since. Her insomnia medications are filled by NP [facility]. Chief concern according to patient : "?I depend on CPAP, I can't breathe without it- when I forget to take my CPAP I need to sleep elevated , seated and snore loudly". The patient had her previous and first sleep study in the year 2000 , on [address] likely at [facility]. [name] is using an autotitration CPAP device by ResMed on factory settings, 5-20 cm water, 3 cm EPR, residual AHI was 2.9/h. her compliance for 90 days was 94% . She uses a nasal mask- has reported experiencing congestion and condensation water- and stopped using water. Lots of air leaks. Needs new mask and headgear, HST or attended sleep study to see baseline, which has not been investigated in 20 years.  The Epworth Sleepiness Scale was 8 out of 24 (scores above or equal to 10 are suggestive of hypersomnolence). ADDITIONAL INFORMATION:  Height: 64.0 in Weight: 198 lb (BMI 33) Neck Size: 17.0 in Medications: Tylenol, Zovirax, Aspirin, Zyrtec, Vitamin D, Cinnamon, Klonopin, Voltaren, Hydrodiuril, Plaquenil, Advil, Lysine, Magnesium, Multivitamin, Zofran, Seroquel, Crestor, Vitamin C, Zinc Gluconate, Lamictal DESCRIPTION: A sleep technologist was in attendance for the duration of the recording.  Data collection, scoring, video monitoring, and reporting were performed in compliance with the AASM Manual for the Scoring of Sleep and Associated Events; (Hypopnea is scored based on the criteria listed in Section VIII D.  1b in the AASM Manual V2.6 using a 4% oxygen desaturation rule or Hypopnea is scored based on the criteria listed in Section VIII D. 1a in the AASM Manual V2.6 using 3% oxygen desaturation and /or arousal rule).  A physician certified by the American Board of Sleep Medicine reviewed each epoch of the study. STUDY DETAILS: Lights off was at 22:09: and lights on 05:29: (440 minutes hours in bed). This study was performed with an initial diagnostic portion followed by positive airway pressure titration. Part 1: DIAGNOSTIC ANALYSIS  SLEEP CONTINUITY AND SLEEP ARCHITECTURE:  The diagnostic portion of the study began at 22:09 and ended at 02:39, for a recording time of 4 h and 30.32m minutes.  Total sleep time was 124 minutes. Sleep position was 11.7% supine;  88.3% lateral;  0.0% prone,  with a decreased sleep efficiency at 45.8%. Sleep latency was increased at 57.5 minutes. REM sleep void-.  Arousal index was 83.2 /hr. Of the total sleep time, the percentage of stage N1 sleep was 14.5%, stage N2 sleep was 58.5%, stage N3 sleep was 27.0%, and REM sleep was 0.0%. Wake after sleep onset (WASO) time accounted for 89 minutes.    RESPIRATORY MONITORING:  Based on CMS criteria (using a 4% oxygen desaturation rule for scoring hypopneas), there were 4 apneas (4 obstructive; 0 central; 0 mixed), and 79 hypopneas.  Apnea index was 1.5/h. Hypopnea index was 34.8/h. The AHI ( apnea-hypopnea index) was 36.3/h overall (AHI was 24.8/h in supine, and 38/h in non-supine). There was loud snoring recorded. Based on AASM criteria (using a 3% oxygen desaturation and /or arousal rule for scoring hypopneas), there were 4 apneas (4 obstructive; 0 central; 0 mixed), and 79 hypopneas.  Apnea index was 1.5. Hypopnea index was 34.8. The apnea-hypopnea index was 36.3 overall (2.9 supine; 0.0 REM, 0.0 supine REM). There were 0 respiratory effort-related arousals (RERAs).  LIMB MOVEMENTS: There were 0 periodic limb movements of sleep.  OXIMETRY: Total  sleep time spent below 89% was 0.1 minutes, or 0.1% of total sleep time. Respiratory events were associated with oxyhemoglobin desaturations (the nadir was 60%) from a normal baseline (mean 95%).  There were 0.0 occurrences of Cheyne Stokes breathing. EKG :Analysis of electrocardiogram activity showed the highest heart rate for the baseline portion of the study was 101.0 beats per minute.  The average heart rate during sleep was 73 bpm, while the highest heart rate for the same period was 98 bpm. AROUSAL (Baseline): There were 172 arousals in total, for an arousal index of 76.0 arousals/hour.  Of these, 68.0 were identified as respiratory-related arousals (32.9 /h), 0 were PLM-related arousals (0.0 /h), and 104 were non-specific arousals (50.3 /h)  Part 2 -TREATMENT ANALYSIS SLEEP CONTINUITY AND SLEEP ARCHITECTURE:  The treatment portion of the study began at 02:39 and ended at 05:29, for a recording time of 2 h 50.74m minutes. she decided on a ResMed N 20  small size nasal mask without chin strap- she tolerated the starter pressure of 6 cm water and the technologist titrated from there: the final explored pressure was at 12 cm water , no EPR,  snoring was no fully controlled but apnea was. Total sleep time was 153 minutes (57.2% supine;  42.8% lateral; 0.0% prone, 14.7% REM sleep), with a normal sleep efficiency at 90.0%.  Sleep latency was normal at 8.5 minutes. REM sleep latency was normal at 60.0 minutes. Of the total sleep time, the percentage of stage N1 sleep was 2.0%, stage N3 sleep was 19.3%, and REM sleep was 14.7%. There were 2 Stage R periods observed during this portion of the study, 5 awakenings (i.e. transitions to Stage W from any sleep stage), and 20.0 total stage transitions. Wake after sleep onset (WASO) time accounted for 08 minutes.  RESPIRATORY MONITORING:  While on PAP therapy, based on CMS criteria, the apnea-hypopnea index was 3.1 overall (0.0 supine; 0.0 REM).  While on PAP therapy, based on  AASM criteria, the apnea-hypopnea index was 3.9 overall (0.0 supine; 0.0 REM). OXIMETRY: Respiratory events were associated with oxyhemoglobin desaturation to a nadir  of 87.0% from a mean of 97.0%.  Total sleep time spent below 89% was 0.1 minutes, or 0.0% of total sleep time.   LIMB MOVEMENTS: There were 4 periodic limb movements of sleep (1.6/hr), of which 0 (0.0/hr) were associated with an arousal. CARDIAC: The EKG documented NSR with isolated PVCs.  The average heart rate during sleep was 71 bpm.  The maximum heart rate during sleep was 94 bpm. AROUSAL: There were 47.0 arousals in total, for an arousal index of 17.6 arousals/hour.  Of these, 10.0 were identified as respiratory-related arousals (3.9 /hr), 0 were PLM-related arousals (0.0 /hr), and 37 were non-specific arousals (14.5 /hr) IMPRESSION: 1)The diagnostic part of the SPLIT night protocol PSG documented severe obstructive sleep apnea, mainly consistent of hypopneas. There was loud snoring recorded. The degree of sleep apnea may have been underestimated as no REM sleep occurred in the first part of the sleep study. The AHI was 36.3/h and sleep was highly fragmented. 2)With CPAP titration, there was a reduction in arousal and improvement in sleep duration. The patient's interface was carefully chosen and she decided on a ResMed N 20 small nasal mask without chin strap- she tolerated the starter pressure of 6 cm water and the technologist titrated from there. The the final explored pressure was at 12 cm water, without EPR. The AHI was 0.0/h and the patient slept almost 2 hours . Her snoring was not fully controlled, but apnea was.  RECOMMENDATIONS: autotitration CPAP at 6- 13 cm water pressure, without EPR , humidifier to be set to zero, and with a ResMed N20 nasal mask in small.  Melvyn Novas, MD  [facility] CPAP/Bilevel Report  General Information Name: [name], [name] BMI: 16 Physician: ,  ID: [id number] Height: 64  in Technician: Margaretann Loveless Sex: Female Weight: 198 lb Record: [id number] Age: 75 [01-26-1956] Date: 10/26/2022 Scorer: Zenon [employer]                     Pressure IPAP/EPAP 00 06 07 08 10 12  O2 Vol 0.0 0.0 0.0 0.0 0.0 0.0 Time TRT 275.35m 19.32m 13.77m 18.32m 6.27m 108.72m  TST 124.52m 9.38m 13.63m 18.57m 6.14m 106.32m Sleep Stage % Wake 43.0 53.8 0.0 0.0 0.0 1.8  % REM 0.0 0.0 0.0 0.0 0.0 21.1  % N1 14.5 33.3 0.0 0.0 0.0 0.0  % N2 58.5 66.7 88.9 41.7 100.0 62.4  % N3 27.0 0.0 11.1 58.3 0.0 16.4 Respiratory Total Events 75 1 0 5 4 0  Obs. Apn. 3 1 0 0 0 0  Mixed Apn. 0 0 0 0 0 0  Cen. Apn. 0 0 0 0 0 0  Hypopneas 72 0 0 5 4 0  AHI 36.29 6.67 0.00 16.67 40.00 0.00  Supine AHI 24.83 0.00 0.00 0.00 0.00 0.00  Prone AHI 0.00 0.00 0.00 0.00 0.00 0.00  Side AHI 37.81 12.00 0.00 16.67 40.00 0.00 Respiratory (4%) Hypopneas (4%) 72.00 0.00 0.00 5.00 2.00 0.00  AHI (4%) 36.29 6.67 0.00 16.67 20.00 0.00  Supine AHI (4%) 24.83 0.00 0.00 0.00 0.00 0.00  Prone AHI (4%) 0.00 0.00 0.00 0.00 0.00 0.00  Side AHI (4%) 37.81 12.00 0.00 16.67 20.00 0.00 Desat Profile <= 90% 18.88m 0.71m 0.65m 0.56m 0.78m 0.29m  <= 80% 11.75m 0.43m 0.67m 0.18m 0.59m 0.68m  <= 70% 11.72m 0.58m 0.32m 0.69m 0.74m 0.63m  <= 60% 11.60m 0.30m 0.47m 0.66m 0.71m 0.62m Arousal Index Apnea 1.5 6.7 0.0 0.0 0.0 0.0  Hypopnea 31.5  0.0 0.0 13.3 50.0 0.0  LM 0.0 0.0 0.0 0.0 0.0 0.0  Spontaneous 50.3 80.0 8.9 23.3 10.0 8.5  [facility] Split Summary  General Information Name: Anabrenda, Felmlee BMI: 33.99 Physician: Melvyn Novas, MD ID: [id number] Height: 64.0 in Technician: Margaretann Loveless, RPSGT Sex: Female Weight: 198.0 lb Record: [id number] Age: 39 [24-Apr-1956] Date: 10/26/2022    Medical & Medication History   [name] is a 67 y.o. female patient who is seen upon referral on 08/10/2022 from Dr Oneta Rack, MD for transfer of sleep medicine Care for a patient on OSA / CPAP care for 2 decades. Her insomnia medications are filled by NP Jones. . Chief concern  according to patient : " I depend on CPAP, I can't breathe without it- when I forget to take my CPAP I need to sleep elevated , seated and snore loudly. Had a  sleep study in the year 2000 , on [address] [facility] . [name] is using an autotitration CPAP device by ResMed on factory settings, 5-20 cm water, 3 cm EPR, residual AHI was 2.9/h. her compliance for 90 days was 94% . She uses a nasal mask- has congestion, and condensation water- stopped using water. Lots of air leaks. Needs new mask and headgear, HST or attended sleep study to see baseline, which has not been investigated in 20 years. Tylenol, Zovirax, Aspirin, Zyrtec, Vitamin D, Cinnamon, Klonopin, Voltaren, Hydrodiuril, Plaquenil, Advil, Lysine, Magnesium, Multivitamin, Zofran, Seroquel, Crestor, Vitamin C, Zinc Gluconate, Lamictal      Comments  The patient came into the lab for a Split night study. The patient was split due to having an AHI greater than 10 after 2 hours of total sleep time per MD's order. The patient took Klonopin and Seroquel prior to start of study. The patient was fitted with a F&P Vitera, Evora (FFM) and ResMed N20 (Nasal). The patient did not like neither FFM. She said it felt like she could not breath. Tech increased pressure to 6cmH2O with EPR of 1. Then the patient said that both FFM felt too heavy, and it was causing her to have a panic attack. Tech did try the ResMed N20 (nasal) on the patient with chin strap. The patient declined wearing the chin strap. She said it was too much for her and it would cause her to have a panic attack. The patient did agree to wear the ResMed N20 (nasal) interface size Small no chin strap. The mask was a good fit to face, and the patient tolerate the mask well. [facility]AP was started at [facility] with EPR of 1. CPAP was used with no humidification per patients request. CPAP was started at this pressure due to the patient saying she felt like she was having a hard time breathing. CPAP was  increased to 8cmH2O with EPR of 1 for continuous loud snoring. CPAP was increased to 12cmH2O with no EPR for respiratory events. The patient had one restroom break. EKG showed PVC's. Moderate to loud snoring. All sleep stages witnessed. Respiratory events scored with a 4% desat. Slept lateral and supine. Few leg movements at the end of the night.   Baseline Sleep Stage Information Baseline start time: 10:09:24 PM Baseline end time: 02:39:36 AM Time Total Supine Side Prone Upright Recording 4h 30.34m 1h 19.20m 3h 11.45m 0h 0.84m 0h 0.77m Sleep 2h 4.43m 0h 14.64m 1h 49.58m 0h 0.17m 0h 0.76m Latency N1 N2 N3 REM Onset Per. Slp. Eff. Actual 0h 57.35m 1h 23.81m 3h 17.33m  0h 0.84m 0h 57.48m 1h 22.49m 45.84% Stg Dur Wake N1 N2 N3 REM Total 89.0 18.0 72.5 33.5 0.0 Supine 40.0 7.5 7.0 0.0 0.0 Side 49.0 10.5 65.5 33.5 0.0 Prone 0.0 0.0 0.0 0.0 0.0 Upright 0.0 0.0 0.0 0.0 0.0  Stg % Wake N1 N2 N3 REM Total 41.8 14.5 58.5 27.0 0.0 Supine 18.8 6.0 5.6 0.0 0.0 Side 23.0 8.5 52.8 27.0 0.0 Prone 0.0 0.0 0.0 0.0 0.0 Upright 0.0 0.0 0.0 0.0 0.0  CPAP Sleep Stage Information CPAP start time: 02:39:36 AM CPAP end time: 05:29:29 AM Time Total Supine Side Prone Upright Recording (TRT) 2h 50.51m 1h 33.73m 1h 16.45m 0h 0.40m 0h 0.23m Sleep (TST) 2h 33.34m 1h 27.91m 1h 5.80m 0h 0.1m 0h 0.50m Latency N1 N2 N3 REM Onset Per. Slp. Eff. Actual 0h 8.69m 0h 11.33m 0h 36.64m 1h 0.38m 0h 8.35m 0h 19.44m 90.00% Stg Dur Wake N1 N2 N3 REM Total 17.0 3.0 98.0 29.5 22.5 Supine 6.0 2.0 61.5 17.5 6.5 Side 11.0 1.0 36.5 12.0 16.0 Prone 0.0 0.0 0.0 0.0 0.0 Upright 0.0 0.0 0.0 0.0 0.0  Stg % Wake N1 N2 N3 REM Total 10.0 2.0 64.1 19.3 14.7 Supine 3.5 1.3 40.2 11.4 4.2 Side 6.5 0.7 23.9 7.8 10.5 Prone 0.0 0.0 0.0 0.0 0.0 Upright 0.0 0.0 0.0 0.0 0.0  Baseline Respiratory Information Apnea Summary Sub Supine Side Prone Upright Total 3 Total 3 0 3 0 0   REM 0 0 0 0 0   NREM 3 0 3 0 0 Obs 3 REM 0 0 0 0 0   NREM 3 0 3 0 0 Mix 0 REM 0 0 0 0 0   NREM 0 0 0 0 0 Cen 0 REM 0 0 0 0 0   NREM 0 0 0 0 0  Rera Summary Sub Supine Side Prone Upright Total 0 Total 0 0 0 0 0   REM 0 0 0 0 0   NREM 0 0 0 0 0  Hypopnea Summary Sub Supine Side Prone Upright Total 72 Total 72 6 66 0 0   REM 0 0 0 0 0   NREM 72 6 66 0 0 4% Hypopnea Summary Sub Supine Side Prone Upright Total (4%) 72 Total 72 6 66 0 0   REM 0 0 0 0 0   NREM 72 6 66 0 0  AHI Total Obs Mix Cen 36.29 Apnea 1.45 1.45 0.00 0.00  Hypopnea 34.84 -- -- -- 36.29 Hypopnea (4%) 34.84 -- -- --  Total Supine Side Prone Upright Position AHI 36.29 24.83 37.81 0.00 0.00 REM AHI 0.00  NREM AHI 36.29  Position RDI 36.29 24.83 37.81 0.00 0.00 REM RDI 0.00  NREM RDI 36.29  4% Hypopnea Total Supine Side Prone Upright Position AHI (4%) 36.29 24.83 37.81 0.00 0.00 REM AHI (4%) 0.00  NREM AHI (4%) 36.29  Position RDI (4%) 36.29 24.83 37.81 0.00 0.00 REM RDI (4%) 0.00  NREM RDI (4%) 36.29  CPAP Respiratory Information Apnea Summary Sub Supine Side Prone Upright Total 1 Total 1 0 1 0 0   REM 0 0 0 0 0   NREM 1 0 1 0 0 Obs 1 REM 0 0 0 0 0   NREM 1 0 1 0 0 Mix 0 REM 0 0 0 0 0   NREM 0 0 0 0 0 Cen 0 REM 0 0 0 0 0   NREM 0 0 0 0 0 Rera Summary Sub Supine Side Prone Upright Total 0  Total 0 0 0 0 0   REM 0 0 0 0 0   NREM 0 0 0 0 0  Hypopnea Summary Sub Supine Side Prone Upright Total 9 Total 9 0 9 0 0   REM 0 0 0 0 0   NREM 9 0 9 0 0 4% Hypopnea Summary Sub Supine Side Prone Upright Total (4%) 7 Total 7 0 7 0 0   REM 0 0 0 0 0   NREM 7 0 7 0 0  AHI Total Obs Mix Cen 3.92 Apnea 0.39 0.39 0.00 0.00  Hypopnea 3.53 -- -- -- 3.14 Hypopnea (4%) 2.75 -- -- --  Total Supine Side Prone Upright Position AHI 3.92 0.00 9.16 0.00 0.00 REM AHI 0.00  NREM AHI 4.60  Position RDI 3.92 0.00 9.16 0.00 0.00 REM RDI 0.00  NREM RDI 4.60  4% Hypopnea Total Supine Side Prone Upright Position AHI (4%) 3.14 0.00 7.33 0.00 0.00 REM AHI (4%) 0.00  NREM AHI (4%) 3.68  Position RDI (4%) 3.14 0.00 7.33 0.00 0.00 REM RDI (4%) 0.00  NREM RDI (4%) 3.68  Desaturation Information (Baseline)  <100% <90% <80% <70% <60% <50% <40%  Supine 20 4 0 0 0 0 0 Side 86 11 0 0 0 0 0 Prone 0 0 0 0 0 0 0 Upright 0 0 0 0 0 0 0 Total 106 15 0 0 0 0 0 Desaturation threshold setting: 4% Minimum desaturation setting: 10 seconds SaO2 nadir: 60% The longest event was a 33 sec obstructive Apneawith a minimum SaO2 of 91%. The lowest SaO2 was 89% associated with a 14 sec obstructive Hypopnea. Awakening/Arousal Information (Baseline) # of Awakenings 33 Wake after sleep onset 89.31m Wake after persistent sleep 66.85m Arousal Assoc. Arousals Index Apneas 3 1.5 Hypopneas 65 31.5 Leg Movements 0 0.0 Snore 0.0 0.0 PTT Arousals 0 0.0 Spontaneous 104 50.3 Total 172 83.2  Desaturation Information (CPAP)  <100% <90% <80% <70% <60% <50% <40% Supine 0 0 0 0 0 0 0 Side 9 2 0 0 0 0 0 Prone 0 0 0 0 0 0 0 Upright 0 0 0 0 0 0 0 Total 9 2 0 0 0 0 0 Desaturation threshold setting: 4% Minimum desaturation setting: 10 seconds SaO2 nadir: 86% The longest event was a 49 sec obstructive Hypopnea with a minimum SaO2 of 89%. The lowest SaO2 was 89% associated with a 49 sec obstructive Hypopnea. Awakening/Arousal Information (CPAP) # of Awakenings 5 Wake after sleep onset 8.51m Wake after persistent sleep 2.11m Arousal Assoc[employer] Arousals Index Apneas 1 0.4 Hypopneas 9 3.5 Leg Movements 0 0.0 Snore 0.0 0.0 PTT Arousals 0 0.0 Spontaneous 37 14.5 Total 47 18.4  EKG Rates (Baseline) EKG Avg Max Min Awake 78 101 67 Asleep 73 98 63 EKG Events: Tachycardia Myoclonus Information (Baseline) PLMS LMs Index Total LMs during PLMS 0 0.0 LMs w/ Microarousals 0 0.0 LM LMs Index w/ Microarousal 0 0.0 w/ Awakening 0 0.0 w/ Resp Event 0 0.0 Spontaneous 1 0.5 Total 1 0.5  EKG Rates (CPAP) EKG Avg Max Min Awake 79 93 66 Asleep 70 94 62 EKG Events: Tachycardia Myoclonus Information (CPAP) PLMS LMs Index Total LMs during PLMS 4 1.6 LMs w/ Microarousals 0 0.0 LM LMs Index w/ Microarousal 0 0.0 w/ Awakening 0 0.0 w/ Resp Event 0 0.0 Spontaneous 8 3.1 Total 8 3.1      Recent Labs: Lab Results  Component Value Date    WBC 5.0 09/16/2022   HGB 13.7 09/16/2022  PLT 306 09/16/2022   NA 144 09/16/2022   K 3.9 09/16/2022   CL 105 09/16/2022   CO2 26 09/16/2022   GLUCOSE 112 (H) 09/16/2022   BUN 18 09/16/2022   CREATININE 0.77 09/16/2022   BILITOT 0.6 09/16/2022   ALKPHOS 89 01/03/2019   AST 27 09/16/2022   ALT 31 (H) 09/16/2022   PROT 7.3 09/16/2022   ALBUMIN 4.7 01/03/2019   CALCIUM 9.5 09/16/2022   GFRAA 103 09/02/2020    Speciality Comments: PLQ Eye Exam: 10/07/2021 WNL @ [facility] Follow up in 1 year.   PLQ eye exam scheduled for 11/13/2022  Procedures:  No procedures performed Allergies: Latex, Celecoxib, Cephalexin, Codeine, Erythromycin, Sulfonamide derivatives, Trazodone and nefazodone, and Venlafaxine   Assessment / Plan:     Visit Diagnoses: Rheumatoid arthritis of multiple sites with negative rheumatoid factor (HCC) - RF-, +anti-CCP: She has no synovitis on examination today.  She continues to have chronic pain and stiffness in both hands due to underlying osteoarthritis.  She has been experiencing nocturnal pain in both hands.  Discussed the importance of joint protection and muscle strengthening.  Offered referral to hand therapy.  Discussed the use of arthritis compression gloves as well as Voltaren gel which she can apply topically for pain relief.  Overall her rheumatoid arthritis appears well-controlled taking Plaquenil 200 mg 1 tablet by mouth twice daily Monday through Friday.  She is tolerating Plaquenil without any side effects.  Plan to update x-rays of both hands and feet to assess for radiographic progression.  If she continues to have ongoing pain, stiffness, and intermittent inflammation in her hands we can schedule an ultrasound to assess for synovitis.- Plan: XR Hand 2 View Right, XR Hand 2 View Left, XR Foot 2 Views Left, XR Foot 2 Views Right  High risk medication use - Plaquenil 200 mg 1 tablet by mouth twice daily Monday to Friday.  CBC and CMP  updated on 09/16/22.  Her next lab work will be due in August and every 5 months to monitor for drug toxicity. PLQ Eye Exam: 10/07/2021 WNL @ [facility] Follow up in 1 year.  PLQ eye exam scheduled for 11/13/2022.  Patient was given a Plaquenil eye examination form to take with her to her upcoming appointment.   Positive anti-CCP test - Anti-CCP 39 (weak positive), RF negative, ESR 6.  Primary osteoarthritis of both hands: She has PIP and DIP thickening consistent with osteoarthritis of both hands.  She continues to have chronic pain in both hands due to underlying osteoarthritis.  No synovitis or dactylitis was noted today.  She has complete fist formation bilaterally.  X-rays of both hands were updated today to assess for radiographic progression.  Discussed the use of arthritis compression gloves.  I also discussed the referral to hand therapy.  She can continue to apply Voltaren gel topically as needed for pain relief.  Status post total bilateral knee replacement: Doing well.  Good range of motion of both knee replacements noted.  No effusion noted.  Primary osteoarthritis of both feet: Chronic pain in the right foot.  Tenderness and inflammation over the dorsal spur on her right foot noted.  She has had cortisone injections in the past which righted temporary relief but her symptoms have returned.  She does not plan on proceeding with surgery at this time.  She plans on trying to wear proper fitting shoes. X-rays of both feet were obtained today  to assess for radiographic progression.  Fibromyalgia: Patient continues to experience intermittent myalgias and muscle tenderness consistent with myofascial pain.  Other medical conditions are listed as follows:  History of prediabetes  Mixed hyperlipidemia  Essential hypertension: Blood pressure was 109/66 today in the office.  Depression, major, recurrent, in partial remission (HCC)  Vitamin D deficiency  OSA on  CPAP  Orders: Orders Placed This Encounter  Procedures   XR Hand 2 View Right   XR Hand 2 View Left   XR Foot 2 Views Left   XR Foot 2 Views Right   No orders of the defined types were placed in this encounter.   Follow-Up Instructions: Return in about 5 months (around 04/13/2023) for Rheumatoid arthritis.   Gearldine Bienenstock, PA-C  Note - This record has been created using Dragon software.  Chart creation errors have been sought, but may not always  have been located. Such creation errors do not reflect on  the standard of medical care.

## 2022-11-06 NOTE — Addendum Note (Signed)
Addended by: Melvyn Novas on: 11/06/2022 06:05 PM   Modules accepted: Orders

## 2022-11-06 NOTE — Procedures (Signed)
[facility] SPLIT NIGHT INTERPRETATION REPORT   STUDY DATE: 10/26/2022     PATIENT NAME:  [name], [name]         DATE OF BIRTH:  Nov 11, 1955  PATIENT ID:  [id number]   Oneta Rack, MD   TYPE OF STUDY:  SPLIT  READING PHYSICIAN: Melvyn Novas,  SCORING TECHNICIAN: Zenon Mayo Fields  08-10-2022: [name] is a 67 y.o. female referred by PCP, Dr Oneta Rack, MD for transfer of Sleep Medicine Care.This patient has been diagnosed over 20 years ago with OSA and has been on CPAP therapy since. Her insomnia medications are filled by NP [facility].  Chief concern according to patient : "?I depend on CPAP, I can't breathe without it- when I forget to take my CPAP I need to sleep elevated , seated and snore loudly". The patient had her previous and first sleep study in the year 2000 , on [address] likely at [facility]. [name] is using an autotitration CPAP device by ResMed on factory settings, 5-20 cm water, 3 cm EPR, residual AHI was 2.9/h. her compliance for 90 days was 94% . She uses a nasal mask- has reported experiencing congestion and condensation water- and stopped using water. Lots of air leaks. Needs new mask and headgear, HST or attended sleep study to see baseline, which has not been investigated in 20 years.  The Epworth Sleepiness Scale was 8 out of 24 (scores above or equal to 10 are suggestive of hypersomnolence). ADDITIONAL INFORMATION:  Height: 64.0 in Weight: 198 lb (BMI 33) Neck Size: 17.0 in Medications: Tylenol, Zovirax, Aspirin, Zyrtec, Vitamin D, Cinnamon, Klonopin, Voltaren, Hydrodiuril, Plaquenil, Advil, Lysine, Magnesium, Multivitamin, Zofran, Seroquel, Crestor, Vitamin C, Zinc Gluconate, Lamictal  DESCRIPTION: A sleep technologist was in attendance for the duration of the recording.  Data collection, scoring, video monitoring, and reporting were performed in compliance with the AASM Manual for the Scoring of Sleep and Associated Events; (Hypopnea is  scored based on the criteria listed in Section VIII D. 1b in the AASM Manual V2.6 using a 4% oxygen desaturation rule or Hypopnea is scored based on the criteria listed in Section VIII D. 1a in the AASM Manual V2.6 using 3% oxygen desaturation and /or arousal rule).  A physician certified by the American Board of Sleep Medicine reviewed each epoch of the study.  STUDY DETAILS: Lights off was at 22:09: and lights on 05:29: (440 minutes hours in bed). This study was performed with an initial diagnostic portion followed by positive airway pressure titration.    Part 1: DIAGNOSTIC ANALYSIS   SLEEP CONTINUITY AND SLEEP ARCHITECTURE:  The diagnostic portion of the study began at 22:09 and ended at 02:39, for a recording time of 4 h and 30.65m minutes.   Total sleep time was 124 minutes. Sleep position was 11.7% supine;  88.3% lateral;  0.0% prone,  with a decreased sleep efficiency at 45.8%.  Sleep latency was increased at 57.5 minutes.  REM sleep void-.  Arousal index was 83.2 /hr.  Of the total sleep time, the percentage of stage N1 sleep was 14.5%, stage N2 sleep was 58.5%, stage N3 sleep was 27.0%, and REM sleep was 0.0%. Wake after sleep onset (WASO) time accounted for 89 minutes.      RESPIRATORY MONITORING:   Based on CMS criteria (using a 4% oxygen desaturation rule for scoring hypopneas), there were 4 apneas (4 obstructive; 0 central; 0 mixed), and 79 hypopneas.   Apnea index was 1.5/h. Hypopnea index was  34.8/h.  The AHI ( apnea-hypopnea index) was 36.3/h overall (AHI was 24.8/h in supine, and 38/h in non-supine).  There was loud snoring recorded.  Based on AASM criteria (using a 3% oxygen desaturation and /or arousal rule for scoring hypopneas), there were 4 apneas (4 obstructive; 0 central; 0 mixed), and 79 hypopneas.  Apnea index was 1.5. Hypopnea index was 34.8. The apnea-hypopnea index was 36.3 overall (2.9 supine; 0.0 REM, 0.0 supine REM).  There were 0 respiratory effort-related  arousals (RERAs).    LIMB MOVEMENTS: There were 0 periodic limb movements of sleep.   OXIMETRY: Total sleep time spent below 89% was 0.1 minutes, or 0.1% of total sleep time. Respiratory events were associated with oxyhemoglobin desaturations (the nadir was 60%) from a normal baseline (mean 95%).  There were 0.0 occurrences of Cheyne Stokes breathing.  EKG :Analysis of electrocardiogram activity showed the highest heart rate for the baseline portion of the study was 101.0 beats per minute.  The average heart rate during sleep was 73 bpm, while the highest heart rate for the same period was 98 bpm.   AROUSAL (Baseline): There were 172 arousals in total, for an arousal index of 76.0 arousals/hour.  Of these, 68.0 were identified as respiratory-related arousals (32.9 /h), 0 were PLM-related arousals (0.0 /h), and 104 were non-specific arousals (50.3 /h)        Part 2 -TREATMENT ANALYSIS  SLEEP CONTINUITY AND SLEEP ARCHITECTURE:  The treatment portion of the study began at 02:39 and ended at 05:29, for a recording time of 2 h 50.51m minutes.  she decided on a ResMed N 20  small size nasal mask without chin strap- she tolerated the starter pressure of 6 cm water and the technologist titrated from there: the final explored pressure was at 12 cm water , no EPR,  snoring was no fully controlled but apnea was.   Total sleep time was 153 minutes (57.2% supine;  42.8% lateral; 0.0% prone, 14.7% REM sleep), with a normal sleep efficiency at 90.0%.  Sleep latency was normal at 8.5 minutes.  REM sleep latency was normal at 60.0 minutes.   Of the total sleep time, the percentage of stage N1 sleep was 2.0%, stage N3 sleep was 19.3%, and REM sleep was 14.7%. There were 2 Stage R periods observed during this portion of the study, 5 awakenings (i.e. transitions to Stage W from any sleep stage), and 20.0 total stage transitions.  Wake after sleep onset (WASO) time accounted for 08 minutes.   RESPIRATORY  MONITORING:    While on PAP therapy, based on CMS criteria, the apnea-hypopnea index was 3.1 overall (0.0 supine; 0.0 REM).   While on PAP therapy, based on AASM criteria, the apnea-hypopnea index was 3.9 overall (0.0 supine; 0.0 REM).  OXIMETRY: Respiratory events were associated with oxyhemoglobin desaturation to a nadir of 87.0% from a mean of 97.0%.  Total sleep time spent below 89% was 0.1 minutes, or 0.0% of total sleep time.     LIMB MOVEMENTS: There were 4 periodic limb movements of sleep (1.6/hr), of which 0 (0.0/hr) were associated with an arousal.  CARDIAC: The EKG documented NSR with isolated PVCs.  The average heart rate during sleep was 71 bpm.  The maximum heart rate during sleep was 94 bpm.   AROUSAL: There were 47.0 arousals in total, for an arousal index of 17.6 arousals/hour.  Of these, 10.0 were identified as respiratory-related arousals (3.9 /hr), 0 were PLM-related arousals (0.0 /hr), and 37 were non-specific arousals (  14.5 /hr)    IMPRESSION: 1)The diagnostic part of the SPLIT night protocol PSG documented severe obstructive sleep apnea, mainly consistent of hypopneas. There was loud snoring recorded. The degree of sleep apnea may have been underestimated as no REM sleep occurred in the first part of the sleep study.  The AHI was 36.3/h and sleep was highly fragmented.     2)With CPAP titration, there was a reduction in arousal and improvement in sleep duration.  The patient's interface was carefully chosen and she decided on a ResMed N 20 small nasal mask without chin strap- she tolerated the starter pressure of 6 cm water and the technologist titrated from there.  The the final explored pressure was at 12 cm water, without EPR.  The AHI was 0.0/h and the patient slept almost 2 hours . Her snoring was not fully controlled, but apnea was.      RECOMMENDATIONS: autotitration CPAP at 6- 13 cm water pressure, without EPR , humidifier to be set to zero, and with a  ResMed N20 nasal mask in small.     Melvyn Novas, MD            [facility] CPAP/Bilevel Report    General Information  Name: [name], [name] BMI: 91 Physician: ,   ID: [id number] Height: 64 in Technician: Margaretann Loveless  Sex: Female Weight: 198 lb Record: [id number]  Age: 26 [11-11-1955] Date: 10/26/2022 Scorer: Zenon Mayo Fields                            Pressure IPAP/EPAP 00 06 07 08 10 12   O2 Vol 0.0 0.0 0.0 0.0 0.0 0.0  Time TRT 275.78m 19.48m 13.73m 18.22m 6.66m 108.80m   TST 124.36m 9.2m 13.80m 18.40m 6.33m 106.64m  Sleep Stage % Wake 43.0 53.8 0.0 0.0 0.0 1.8   % REM 0.0 0.0 0.0 0.0 0.0 21.1   % N1 14.5 33.3 0.0 0.0 0.0 0.0   % N2 58.5 66.7 88.9 41.7 100.0 62.4   % N3 27.0 0.0 11.1 58.3 0.0 16.4  Respiratory Total Events 75 1 0 5 4 0   Obs. Apn. 3 1 0 0 0 0   Mixed Apn. 0 0 0 0 0 0   Cen. Apn. 0 0 0 0 0 0   Hypopneas 72 0 0 5 4 0   AHI 36.29 6.67 0.00 16.67 40.00 0.00   Supine AHI 24.83 0.00 0.00 0.00 0.00 0.00   Prone AHI 0.00 0.00 0.00 0.00 0.00 0.00   Side AHI 37.81 12.00 0.00 16.67 40.00 0.00  Respiratory (4%) Hypopneas (4%) 72.00 0.00 0.00 5.00 2.00 0.00   AHI (4%) 36.29 6.67 0.00 16.67 20.00 0.00   Supine AHI (4%) 24.83 0.00 0.00 0.00 0.00 0.00   Prone AHI (4%) 0.00 0.00 0.00 0.00 0.00 0.00   Side AHI (4%) 37.81 12.00 0.00 16.67 20.00 0.00  Desat Profile <= 90% 18.76m 0.30m 0.52m 0.35m 0.55m 0.22m   <= 80% 11.53m 0.39m 0.60m 0.2m 0.32m 0.18m   <= 70% 11.77m 0.80m 0.66m 0.52m 0.42m 0.39m   <= 60% 11.51m 0.62m 0.23m 0.60m 0.66m 0.81m  Arousal Index Apnea 1.5 6.7 0.0 0.0 0.0 0.0   Hypopnea 31.5 0.0 0.0 13.3 50.0 0.0   LM 0.0 0.0 0.0 0.0 0.0 0.0   Spontaneous 50.3 80.0 8.9 23.3 10.0 8.5    [facility] Split Summary    General Information  Name: [name], [name] BMI: 33.99 Physician: Melvyn Novas, MD  ID: [id number] Height: 64.0 in Technician: Margaretann Loveless, RPSGT  Sex: Female Weight: 198.0 lb  Record: [id number]  Age: 3 [[birth year]] Date: 10/26/2022     Medical & Medication History    [name] is a 67 y.o. female patient who is seen upon referral on 08/10/2022 from Dr Oneta Rack, MD for transfer of sleep medicine Care for a patient on OSA / CPAP care for 2 decades. Her insomnia medications are filled by NP [facility]. . Chief concern according to patient : " I depend on CPAP, I can't breathe without it- when I forget to take my CPAP I need to sleep elevated , seated and snore loudly. Had a  sleep study in the year 2000 , on [address], [facility]- . [name] is using an autotitration CPAP device by ResMed on factory settings, 5-20 cm water, 3 cm EPR, residual AHI was 2.9/h. her compliance for 90 days was 94% . She uses a nasal mask- has congestion, and condensation water- stopped using water. Lots of air leaks. Needs new mask and headgear, HST or attended sleep study to see baseline, which has not been investigated in 20 years.  Tylenol, Zovirax, Aspirin, Zyrtec, Vitamin D, Cinnamon, Klonopin, Voltaren, Hydrodiuril, Plaquenil, Advil, Lysine, Magnesium, Multivitamin, Zofran, Seroquel, Crestor, Vitamin C, Zinc Gluconate, Lamictal         Comments   The patient came into the lab for a Split night study. The patient was split due to having an AHI greater than 10 after 2 hours of total sleep time per MD's order. The patient took Klonopin and Seroquel prior to start of study. The patient was fitted with a F&P Vitera, Evora (FFM) and ResMed N20 (Nasal). The patient did not like neither FFM. She said it felt like she could not breath. Tech increased pressure to 6cmH2O with EPR of 1. Then the patient said that both FFM felt too heavy, and it was causing her to have a panic attack. Tech did try the ResMed N20 (nasal) on the patient with chin strap. The patient declined wearing the chin strap. She said it was too much for her and it would cause her to have a panic attack. The patient did agree to  wear the ResMed N20 (nasal) interface size Small no chin strap. The mask was a good fit to face, and the patient tolerate the mask well. CPAP was started at [facility] with EPR of 1. CPAP was used with no humidification per patients request. CPAP was started at this pressure due to the patient saying she felt like she was having a hard time breathing. CPAP was increased to 8cmH2O with EPR of 1 for continuous loud snoring. CPAP was increased to 12cmH2O with no EPR for respiratory events. The patient had one restroom break. EKG showed PVC's. Moderate to loud snoring. All sleep stages witnessed. Respiratory events scored with a 4% desat. Slept lateral and supine. Few leg movements at the end of the night.    Baseline Sleep Stage Information Baseline start time: 10:09:24 PM Baseline end time: 02:39:36 AM   Time Total Supine Side Prone Upright  Recording 4h 30.8m 1h 19.56m 3h 11.47m 0h 0.62m 0h 0.50m  Sleep 2h 4.14m 0h 14.3m 1h 49.62m 0h 0.34m 0h 0.76m   Latency N1 N2 N3 REM Onset Per. Slp. Eff.  Actual 0h 57.11m 1h 23.32m 3h 17.35m 0h 0.49m 0h 57.22m 1h 22.29m 45.84%   Stg Dur Wake N1 N2 N3  REM  Total 89.0 18.0 72.5 33.5 0.0  Supine 40.0 7.5 7.0 0.0 0.0  Side 49.0 10.5 65.5 33.5 0.0  Prone 0.0 0.0 0.0 0.0 0.0  Upright 0.0 0.0 0.0 0.0 0.0   Stg % Wake N1 N2 N3 REM  Total 41.8 14.5 58.5 27.0 0.0  Supine 18.8 6.0 5.6 0.0 0.0  Side 23.0 8.5 52.8 27.0 0.0  Prone 0.0 0.0 0.0 0.0 0.0  Upright 0.0 0.0 0.0 0.0 0.0    CPAP Sleep Stage Information CPAP start time: 02:39:36 AM CPAP end time: 05:29:29 AM   Time Total Supine Side Prone Upright  Recording (TRT) 2h 50.31m 1h 33.70m 1h 16.29m 0h 0.15m 0h 0.55m  Sleep (TST) 2h 33.65m 1h 27.83m 1h 5.28m 0h 0.16m 0h 0.29m   Latency N1 N2 N3 REM Onset Per. Slp. Eff.  Actual 0h 8.38m 0h 11.29m 0h 36.80m 1h 0.5m 0h 8.21m 0h 19.96m 90.00%   Stg Dur Wake N1 N2 N3 REM  Total 17.0 3.0 98.0 29.5 22.5  Supine 6.0 2.0 61.5 17.5 6.5  Side 11.0 1.0 36.5 12.0 16.0  Prone 0.0 0.0 0.0 0.0 0.0   Upright 0.0 0.0 0.0 0.0 0.0   Stg % Wake N1 N2 N3 REM  Total 10.0 2.0 64.1 19.3 14.7  Supine 3.5 1.3 40.2 11.4 4.2  Side 6.5 0.7 23.9 7.8 10.5  Prone 0.0 0.0 0.0 0.0 0.0  Upright 0.0 0.0 0.0 0.0 0.0    Baseline Respiratory Information Apnea Summary Sub Supine Side Prone Upright  Total 3 Total 3 0 3 0 0    REM 0 0 0 0 0    NREM 3 0 3 0 0  Obs 3 REM 0 0 0 0 0    NREM 3 0 3 0 0  Mix 0 REM 0 0 0 0 0    NREM 0 0 0 0 0  Cen 0 REM 0 0 0 0 0    NREM 0 0 0 0 0   Rera Summary Sub Supine Side Prone Upright  Total 0 Total 0 0 0 0 0    REM 0 0 0 0 0    NREM 0 0 0 0 0   Hypopnea Summary Sub Supine Side Prone Upright  Total 72 Total 72 6 66 0 0    REM 0 0 0 0 0    NREM 72 6 66 0 0   4% Hypopnea Summary Sub Supine Side Prone Upright  Total (4%) 72 Total 72 6 66 0 0    REM 0 0 0 0 0    NREM 72 6 66 0 0     AHI Total Obs Mix Cen  36.29 Apnea 1.45 1.45 0.00 0.00   Hypopnea 34.84 -- -- --  36.29 Hypopnea (4%) 34.84 -- -- --    Total Supine Side Prone Upright  Position AHI 36.29 24.83 37.81 0.00 0.00  REM AHI 0.00   NREM AHI 36.29   Position RDI 36.29 24.83 37.81 0.00 0.00  REM RDI 0.00   NREM RDI 36.29    4% Hypopnea Total Supine Side Prone Upright  Position AHI (4%) 36.29 24.83 37.81 0.00 0.00  REM AHI (4%) 0.00   NREM AHI (4%) 36.29   Position RDI (4%) 36.29 24.83 37.81 0.00 0.00  REM RDI (4%) 0.00   NREM RDI (4%) 36.29    CPAP Respiratory Information Apnea Summary Sub Supine Side Prone Upright  Total 1 Total 1 0 1 0 0  REM 0 0 0 0 0    NREM 1 0 1 0 0  Obs 1 REM 0 0 0 0 0    NREM 1 0 1 0 0  Mix 0 REM 0 0 0 0 0    NREM 0 0 0 0 0  Cen 0 REM 0 0 0 0 0    NREM 0 0 0 0 0   Rera Summary Sub Supine Side Prone Upright  Total 0 Total 0 0 0 0 0    REM 0 0 0 0 0    NREM 0 0 0 0 0   Hypopnea Summary Sub Supine Side Prone Upright  Total 9 Total 9 0 9 0 0    REM 0 0 0 0 0    NREM 9 0 9 0 0   4% Hypopnea Summary Sub Supine Side Prone Upright  Total (4%) 7 Total 7 0 7  0 0    REM 0 0 0 0 0    NREM 7 0 7 0 0     AHI Total Obs Mix Cen  3.92 Apnea 0.39 0.39 0.00 0.00   Hypopnea 3.53 -- -- --  3.14 Hypopnea (4%) 2.75 -- -- --    Total Supine Side Prone Upright  Position AHI 3.92 0.00 9.16 0.00 0.00  REM AHI 0.00   NREM AHI 4.60   Position RDI 3.92 0.00 9.16 0.00 0.00  REM RDI 0.00   NREM RDI 4.60    4% Hypopnea Total Supine Side Prone Upright  Position AHI (4%) 3.14 0.00 7.33 0.00 0.00  REM AHI (4%) 0.00   NREM AHI (4%) 3.68   Position RDI (4%) 3.14 0.00 7.33 0.00 0.00  REM RDI (4%) 0.00   NREM RDI (4%) 3.68    Desaturation Information (Baseline)  <100% <90% <80% <70% <60% <50% <40%  Supine 20 4 0 0 0 0 0  Side 86 11 0 0 0 0 0  Prone 0 0 0 0 0 0 0  Upright 0 0 0 0 0 0 0  Total 106 15 0 0 0 0 0  Desaturation threshold setting: 4% Minimum desaturation setting: 10 seconds SaO2 nadir: 60% The longest event was a 33 sec obstructive Apneawith a minimum SaO2 of 91%. The lowest SaO2 was 89% associated with a 14 sec obstructive Hypopnea. Awakening/Arousal Information (Baseline) # of Awakenings 33  Wake after sleep onset 89.47m  Wake after persistent sleep 66.4m   Arousal Assoc. Arousals Index  Apneas 3 1.5  Hypopneas 65 31.5  Leg Movements 0 0.0  Snore 0.0 0.0  PTT Arousals 0 0.0  Spontaneous 104 50.3  Total 172 83.2   Desaturation Information (CPAP)  <100% <90% <80% <70% <60% <50% <40%  Supine 0 0 0 0 0 0 0  Side 9 2 0 0 0 0 0  Prone 0 0 0 0 0 0 0  Upright 0 0 0 0 0 0 0  Total 9 2 0 0 0 0 0  Desaturation threshold setting: 4% Minimum desaturation setting: 10 seconds SaO2 nadir: 86% The longest event was a 49 sec obstructive Hypopnea with a minimum SaO2 of 89%. The lowest SaO2 was 89% associated with a 49 sec obstructive Hypopnea. Awakening/Arousal Information (CPAP) # of Awakenings 5  Wake after sleep onset 8.28m  Wake after persistent sleep 2.17m   Arousal Assoc. Arousals Index  Apneas 1 0.4  Hypopneas 9 3.5  Leg Movements 0  0.0  Snore 0.0 0.0  PTT Arousals 0 0.0  Spontaneous 37 14.5  Total 47 18.4     EKG Rates (Baseline) EKG Avg Max Min  Awake 78 101 67  Asleep 73 98 63  EKG Events: Tachycardia Myoclonus Information (Baseline) PLMS LMs Index  Total LMs during PLMS 0 0.0  LMs w/ Microarousals 0 0.0   LM LMs Index  w/ Microarousal 0 0.0  w/ Awakening 0 0.0  w/ Resp Event 0 0.0  Spontaneous 1 0.5  Total 1 0.5   EKG Rates (CPAP) EKG Avg Max Min  Awake 79 93 66  Asleep 70 94 62  EKG Events: Tachycardia Myoclonus Information (CPAP) PLMS LMs Index  Total LMs during PLMS 4 1.6  LMs w/ Microarousals 0 0.0   LM LMs Index  w/ Microarousal 0 0.0  w/ Awakening 0 0.0  w/ Resp Event 0 0.0  Spontaneous 8 3.1  Total 8 3.1

## 2022-11-09 ENCOUNTER — Encounter: Payer: Self-pay | Admitting: Neurology

## 2022-11-11 ENCOUNTER — Ambulatory Visit: Payer: Medicare PPO

## 2022-11-11 ENCOUNTER — Ambulatory Visit (INDEPENDENT_AMBULATORY_CARE_PROVIDER_SITE_OTHER): Payer: Medicare PPO

## 2022-11-11 ENCOUNTER — Ambulatory Visit: Payer: Medicare PPO | Attending: Physician Assistant | Admitting: Physician Assistant

## 2022-11-11 ENCOUNTER — Encounter: Payer: Self-pay | Admitting: Physician Assistant

## 2022-11-11 VITALS — BP 109/66 | HR 76 | Resp 15 | Ht 64.0 in | Wt 197.0 lb

## 2022-11-11 DIAGNOSIS — Z79899 Other long term (current) drug therapy: Secondary | ICD-10-CM | POA: Diagnosis not present

## 2022-11-11 DIAGNOSIS — M0609 Rheumatoid arthritis without rheumatoid factor, multiple sites: Secondary | ICD-10-CM | POA: Diagnosis not present

## 2022-11-11 DIAGNOSIS — M19041 Primary osteoarthritis, right hand: Secondary | ICD-10-CM | POA: Diagnosis not present

## 2022-11-11 DIAGNOSIS — E559 Vitamin D deficiency, unspecified: Secondary | ICD-10-CM

## 2022-11-11 DIAGNOSIS — M79671 Pain in right foot: Secondary | ICD-10-CM | POA: Diagnosis not present

## 2022-11-11 DIAGNOSIS — R768 Other specified abnormal immunological findings in serum: Secondary | ICD-10-CM

## 2022-11-11 DIAGNOSIS — M19072 Primary osteoarthritis, left ankle and foot: Secondary | ICD-10-CM

## 2022-11-11 DIAGNOSIS — Z87898 Personal history of other specified conditions: Secondary | ICD-10-CM | POA: Diagnosis not present

## 2022-11-11 DIAGNOSIS — I10 Essential (primary) hypertension: Secondary | ICD-10-CM

## 2022-11-11 DIAGNOSIS — M79672 Pain in left foot: Secondary | ICD-10-CM | POA: Diagnosis not present

## 2022-11-11 DIAGNOSIS — Z96653 Presence of artificial knee joint, bilateral: Secondary | ICD-10-CM

## 2022-11-11 DIAGNOSIS — R7681 Abnormal rheumatoid factor and anti-citrullinated protein antibody without rheumatoid arthritis: Secondary | ICD-10-CM

## 2022-11-11 DIAGNOSIS — M797 Fibromyalgia: Secondary | ICD-10-CM

## 2022-11-11 DIAGNOSIS — G4733 Obstructive sleep apnea (adult) (pediatric): Secondary | ICD-10-CM

## 2022-11-11 DIAGNOSIS — M19071 Primary osteoarthritis, right ankle and foot: Secondary | ICD-10-CM | POA: Diagnosis not present

## 2022-11-11 DIAGNOSIS — E782 Mixed hyperlipidemia: Secondary | ICD-10-CM | POA: Diagnosis not present

## 2022-11-11 DIAGNOSIS — M19042 Primary osteoarthritis, left hand: Secondary | ICD-10-CM

## 2022-11-11 DIAGNOSIS — F3341 Major depressive disorder, recurrent, in partial remission: Secondary | ICD-10-CM

## 2022-11-11 NOTE — Patient Instructions (Signed)
Standing Labs We placed an order today for your standing lab work.   Please have your standing labs drawn in August and every 3 months   Please have your labs drawn 2 weeks prior to your appointment so that the provider can discuss your lab results at your appointment, if possible.  Please note that you may see your imaging and lab results in MyChart before we have reviewed them. We will contact you once all results are reviewed. Please allow our office up to 72 hours to thoroughly review all of the results before contacting the office for clarification of your results.  WALK-IN LAB HOURS  Monday through Thursday from 8:00 am -12:30 pm and 1:00 pm-5:00 pm and Friday from 8:00 am-12:00 pm.  Patients with office visits requiring labs will be seen before walk-in labs.  You may encounter longer than normal wait times. Please allow additional time. Wait times may be shorter on  Monday and Thursday afternoons.  We do not book appointments for walk-in labs. We appreciate your patience and understanding with our staff.   Labs are drawn by [facility]. Please bring your co-pay at the time of your lab draw.  You may receive a bill from [facility] for your lab work.  Please note if you are on Hydroxychloroquine and and an order has been placed for a Hydroxychloroquine level,  you will need to have it drawn 4 hours or more after your last dose.  If you wish to have your labs drawn at another location, please call the office 24 hours in advance so we can fax the orders.  The office is located at [address], [address], [place], [place] [address]   If you have any questions regarding directions or hours of operation,  please call [phone].   As a reminder, please drink plenty of water prior to coming for your lab work. Thanks!  

## 2022-11-11 NOTE — Progress Notes (Signed)
No erosive changes.  No radiographic progression was noted since 2021. X-rays are consistent with osteoarthritis.

## 2022-11-13 DIAGNOSIS — H52222 Regular astigmatism, left eye: Secondary | ICD-10-CM | POA: Diagnosis not present

## 2022-11-13 DIAGNOSIS — H5213 Myopia, bilateral: Secondary | ICD-10-CM | POA: Diagnosis not present

## 2022-11-13 DIAGNOSIS — H04123 Dry eye syndrome of bilateral lacrimal glands: Secondary | ICD-10-CM | POA: Diagnosis not present

## 2022-11-13 DIAGNOSIS — H524 Presbyopia: Secondary | ICD-10-CM | POA: Diagnosis not present

## 2022-11-13 DIAGNOSIS — H53143 Visual discomfort, bilateral: Secondary | ICD-10-CM | POA: Diagnosis not present

## 2022-11-13 DIAGNOSIS — H2513 Age-related nuclear cataract, bilateral: Secondary | ICD-10-CM | POA: Diagnosis not present

## 2022-11-13 DIAGNOSIS — Z79899 Other long term (current) drug therapy: Secondary | ICD-10-CM | POA: Diagnosis not present

## 2022-11-24 DIAGNOSIS — G4733 Obstructive sleep apnea (adult) (pediatric): Secondary | ICD-10-CM | POA: Diagnosis not present

## 2022-11-25 DIAGNOSIS — F3132 Bipolar disorder, current episode depressed, moderate: Secondary | ICD-10-CM | POA: Diagnosis not present

## 2022-12-23 ENCOUNTER — Ambulatory Visit: Payer: Medicare PPO | Admitting: Nurse Practitioner

## 2022-12-23 ENCOUNTER — Encounter: Payer: Self-pay | Admitting: Nurse Practitioner

## 2022-12-23 VITALS — BP 140/76 | HR 76 | Temp 97.8°F | Ht 64.0 in | Wt 199.0 lb

## 2022-12-23 DIAGNOSIS — Z0000 Encounter for general adult medical examination without abnormal findings: Secondary | ICD-10-CM

## 2022-12-23 DIAGNOSIS — E559 Vitamin D deficiency, unspecified: Secondary | ICD-10-CM

## 2022-12-23 DIAGNOSIS — M797 Fibromyalgia: Secondary | ICD-10-CM

## 2022-12-23 DIAGNOSIS — R6889 Other general symptoms and signs: Secondary | ICD-10-CM | POA: Diagnosis not present

## 2022-12-23 DIAGNOSIS — M0600 Rheumatoid arthritis without rheumatoid factor, unspecified site: Secondary | ICD-10-CM

## 2022-12-23 DIAGNOSIS — E782 Mixed hyperlipidemia: Secondary | ICD-10-CM

## 2022-12-23 DIAGNOSIS — M170 Bilateral primary osteoarthritis of knee: Secondary | ICD-10-CM

## 2022-12-23 DIAGNOSIS — R7309 Other abnormal glucose: Secondary | ICD-10-CM | POA: Diagnosis not present

## 2022-12-23 DIAGNOSIS — G4733 Obstructive sleep apnea (adult) (pediatric): Secondary | ICD-10-CM

## 2022-12-23 DIAGNOSIS — F3341 Major depressive disorder, recurrent, in partial remission: Secondary | ICD-10-CM

## 2022-12-23 DIAGNOSIS — I10 Essential (primary) hypertension: Secondary | ICD-10-CM

## 2022-12-23 DIAGNOSIS — Z79899 Other long term (current) drug therapy: Secondary | ICD-10-CM

## 2022-12-23 DIAGNOSIS — Z0001 Encounter for general adult medical examination with abnormal findings: Secondary | ICD-10-CM

## 2022-12-23 DIAGNOSIS — R42 Dizziness and giddiness: Secondary | ICD-10-CM

## 2022-12-23 NOTE — Patient Instructions (Signed)

## 2022-12-23 NOTE — Progress Notes (Signed)
MEDICARE ANNUAL WELLNESS VISIT AND FOLLOW UP  Assessment:   Diagnoses and all orders for this visit:  Encounter for Medicare annual wellness exam Due annually  Health maintenance reviewed  Essential hypertension Discussed DASH (Dietary Approaches to Stop Hypertension) DASH diet is lower in sodium than a typical American diet. Cut back on foods that are high in saturated fat, cholesterol, and trans fats. Eat more whole-grain foods, fish, poultry, and nuts Remain active and exercise as tolerated daily.  Monitor BP at home-Call if greater than 130/80.  Check CMP/CBC  Vitamin D deficiency Continue supplement. Check and monitor levels  Prediabetes Education: Reviewed 'ABCs' of diabetes management  Discussed goals to be met and/or maintained include A1C (<7) Blood pressure (<130/80) Cholesterol (LDL <70) Continue Eye Exam yearly  Continue Dental Exam Q6 mo Discussed dietary recommendations Check A1C  Morbid obesity - BMI 30+ with OSA  Discussed appropriate BMI Diet modification. Physical activity. Encouraged/praised to build confidence.  Mixed hyperlipidemia Discussed lifestyle modifications. Recommended diet heavy in fruits and veggies, omega 3's. Decrease consumption of animal meats, cheeses, and dairy products. Remain active and exercise as tolerated. Continue to monitor. Check lipids/TSH  Medication management All medications discussed and reviewed in full. All questions and concerns regarding medications addressed.    Fibromyalgia Lifestyle discussed: diet/exerise, sleep hygiene, stress management, hydration Continue current medications for sleep management  Depression, major, recurrent, in partial remission (HCC)  Psych manages meds  Lifestyle discussed: diet/exerise, sleep hygiene, stress management, hydration Reviewed relaxation techniques.  Sleep hygiene. Recommended mindfulness meditation and exercise.   Psychoeducation:  encouraged personality growth  wand development through coping techniques and problem-solving skills.  OSA on CPAP 100% compliance Improved. Continue to monitor  Primary osteoarthritis of both knees Followed by Dr. Luiz Blare, s/p bil TKA  Continue to monitor  Seronegative RA (HCC) Followed by Dr. Corliss Skains;  On hydroxychloroquine and ginger root Continue to monitor  Vertigo Discuss vestibular rehab if s/s fail to improve Continue Meclizine PRN Change positions slowly to reduce risk of falls  Orders Placed This Encounter  Procedures   CBC with Differential/Platelet   COMPLETE METABOLIC PANEL WITH GFR   Lipid panel   Hemoglobin A1c    Notify office for further evaluation and treatment, questions or concerns if any reported s/s fail to improve.   The patient was advised to call back or seek an in-person evaluation if any symptoms worsen or if the condition fails to improve as anticipated.   Further disposition pending results of labs. Discussed med's effects and SE's.    I discussed the assessment and treatment plan with the patient. The patient was provided an opportunity to ask questions and all were answered. The patient agreed with the plan and demonstrated an understanding of the instructions.  Discussed med's effects and SE's. Screening labs and tests as requested with regular follow-up as recommended.  I provided 35 minutes of face-to-face time during this encounter including counseling, chart review, and critical decision making was preformed.   Future Appointments  Date Time Provider Department Center  01/14/2023  9:00 AM Shawnie Dapper, NP [facility] None  03/25/2023 11:30 AM Lucky Cowboy, MD [facility] None  04/14/2023  9:30 AM Gearldine Bienenstock, PA-C [facility] None  06/30/2023 10:30 AM Adela Glimpse, NP [facility] None  09/30/2023 10:00 AM Lucky Cowboy, MD [facility] None  12/23/2023 11:30 AM Adela Glimpse, NP [facility] None     Plan:   During the course of the visit the patient was educated  and counseled about appropriate screening  and preventive services including:   Pneumococcal vaccine  Prevnar 13 Influenza vaccine Td vaccine Screening electrocardiogram Bone densitometry screening Colorectal cancer screening Diabetes screening Glaucoma screening Nutrition counseling  Advanced directives: requested   Subjective:  [name] is a 67 y.o. female who presents for Medicare Annual Wellness Visit and 3 month follow up. She has Essential hypertension; Vitamin D deficiency; Other chronic pain; Depression, major, recurrent, in partial remission (HCC); Morbid obesity (HCC) - BMI 30+ with OSA; OSA on CPAP; Osteoarthritis of both knees; Other abnormal glucose (prediabetes); Fatty liver; Hyperlipidemia; Seronegative rheumatoid arthritis (HCC); and Osteopenia on their problem list.  Overall she reports feeling well.  She has no new or additional concerns in clinic today.  She had a new sleep study 10/2022 with Dr. Vickey Huger, Neurology.  Now wearing new CPAP with reports of improved sleep.  Continues to follow Dr. Corliss Skains for RA.  Patient has been on SS Disability since age 30 (2007) for Panic, Anxiety & Depression, and also has fibromyalgia. She is followed by PA Emmie Niemann for mental health management. She reports she is doing very well at this time.   She is followed by mental health.  Reports mood is stable.  She is followed by Dr. Luiz Blare for arthritis; s/p bil TKA. Newly seeing Dr. Corliss Skains for seronegative RA complicated by OA, now on hydroxychloroquine and started on ginger root. Has burning in hands that bothers her at night. Taking tylenol to help managed.  She has a hx of vertigo.  Had not had a flare in many years, however has had x2 episodes in the last two weeks.  Associated nausea.  She has taken Meclizine and Zofran with effectiveness.  She has not completed any past PT.     BMI is Body mass index is 34.16 kg/m., she has been working on diet, exercise. Wt  Readings from Last 3 Encounters:  12/23/22 199 lb (90.3 kg)  11/11/22 197 lb (89.4 kg)  09/16/22 198 lb (89.8 kg)    Her blood pressure has been controlled at home, today their BP is BP: (!) 140/76 She does not workout. She denies chest pain, shortness of breath, dizziness.   She is on cholesterol medication (crestor 20 mg daily) and denies myalgias. Her LDL cholesterol is at goal. The cholesterol last visit was:   Lab Results  Component Value Date   CHOL 126 09/16/2022   HDL 52 09/16/2022   LDLCALC 52 09/16/2022   TRIG 135 09/16/2022   CHOLHDL 2.4 09/16/2022    She has not been working on diet and exercise for prediabetes, and denies foot ulcerations, increased appetite, nausea, paresthesia of the feet, polydipsia, polyuria, visual disturbances, vomiting and weight loss. Last A1C in the office was:  Lab Results  Component Value Date   HGBA1C 6.1 (H) 09/16/2022   Last GFR: Lab Results  Component Value Date   GFRNONAA 89 09/02/2020   Patient is on Vitamin D supplement and at goal at recent check:   Lab Results  Component Value Date   VD25OH 82 09/16/2022       Medication Review: Current Outpatient Medications on File Prior to Visit  Medication Sig Dispense Refill   acetaminophen (TYLENOL) 500 MG tablet Take 500 mg by mouth every 6 (six) hours as needed.     acyclovir (ZOVIRAX) 400 MG tablet Take 1 tablet (400 mg total) by mouth 3 (three) times daily as needed (fever blisters). 21 tablet 1   aspirin EC 81 MG tablet Take 81 mg  by mouth daily.     cetirizine (ZYRTEC) 10 MG tablet Take 10 mg by mouth daily.      Cholecalciferol (VITAMIN D PO) Take 8,000 Int'l Units by mouth daily. 2000 units per capsule     CINNAMON PO Take 2,000 mg by mouth daily.     clonazePAM (KLONOPIN) 1 MG tablet Take 1 mg by mouth at bedtime as needed for anxiety.      diclofenac Sodium (VOLTAREN) 1 % GEL Apply topically daily.     esomeprazole (NEXIUM) 40 MG capsule Take 40 mg by mouth daily.      glucose blood (ACCU-CHEK AVIVA PLUS) test strip CHECK BLOOD SUGAR 1 TIME DAILY. DX-R73.03 100 each 1   hydrochlorothiazide (HYDRODIURIL) 25 MG tablet TAKE 1 TABLET BY MOUTH DAILY FOR BLOOD PRESSURE AND FLUID RETENTION OR ANKLE SWELLING 90 tablet 3   hydroxychloroquine (PLAQUENIL) 200 MG tablet TAKE 1 TABLET BY MOUTH TWICE DAILY( EVERY 12 HOURS) MONDAY TO FRIDAY 120 tablet 0   ibuprofen (ADVIL) 200 MG tablet Take 200 mg by mouth every 6 (six) hours as needed.     lamoTRIgine (LAMICTAL) 150 MG tablet Take 1 & 1/2 tablets at bedtime (Patient taking differently: Take 1 tablet at bedtime) 135 tablet 1   Lancets (ACCU-CHEK MULTICLIX) lancets Use to check blood glucose daily 100 each PRN   Lysine 500 MG TABS Take 1,000 mg by mouth daily. Currently taking 2 pill QD     Magnesium 500 MG TABS Take 2,000 mg by mouth daily. Takes 4 tablets daily     Multiple Vitamin (MULTIVITAMIN WITH MINERALS) TABS tablet Take 0.5 tablets by mouth 2 (two) times daily.     ondansetron (ZOFRAN) 4 MG tablet Take 1 tablet (4 mg total) by mouth daily as needed for nausea or vomiting. 30 tablet 1   QUEtiapine (SEROQUEL) 100 MG tablet Take 100 mg by mouth 3 (three) times daily.     rosuvastatin (CRESTOR) 40 MG tablet TAKE 1 TABLET BY MOUTH DAILY FOR CHOLESTEROL 90 tablet 3   vitamin C (ASCORBIC ACID) 500 MG tablet Take 500 mg by mouth 2 (two) times daily.     zinc gluconate 50 MG tablet Take 50 mg by mouth daily.     No current facility-administered medications on file prior to visit.    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)    Current Problems (verified) Patient Active  Problem List   Diagnosis Date Noted   Osteopenia 09/03/2021   Seronegative rheumatoid arthritis (HCC) 03/12/2021   Hyperlipidemia 03/11/2021   Fatty liver 12/12/2019   Other abnormal glucose (prediabetes) 05/23/2019   Osteoarthritis of both knees 03/20/2015   OSA on CPAP 08/15/2014   Other chronic pain 01/31/2014   Depression, major, recurrent, in partial remission (HCC) 01/31/2014   Morbid obesity (HCC) - BMI 30+ with OSA 01/31/2014   Essential hypertension 08/09/2013   Vitamin D deficiency 08/09/2013    Screening Tests Immunization History  Administered Date(s) Administered   DT (Pediatric) 07/05/2015   Influenza  Inj Mdck Quad With Preservative 05/28/2017, 04/13/2018   Influenza Split 04/06/2012, 04/05/2013, 05/16/2014, 03/21/2015   Influenza,inj,Quad PF,6+ Mos 03/16/2019   Influenza,inj,quad, With Preservative 04/29/2016   PFIZER(Purple Top)SARS-COV-2 Vaccination 11/08/2019, 11/29/2019, 08/13/2020   Pneumococcal Conjugate-13 05/16/2014   Pneumococcal-Unspecified 06/22/1998   Td 06/22/2004   Zoster, Live 10/21/2015   Preventative care: Last colonoscopy: 2019 due 11/2027 Mammogram:  08/2022 Due yearly DEXA: 08/2021 T-Score -1.5 Due 2 years  Prior vaccinations:  TD or Tdap: 2017 Influenza: Due 03/2022 Pneumococcal: 2000 Prevnar13: 2015 Shingles/Zostavax: 2017, had first shot of shingrix with reaction, declined second shot Covid 19: 2/2, 2021, pfizer - booster 07/2020  Names of Other Physician/Practitioners you currently use: 1. [facility] here for primary care 2. Dr. Carlena Sax, eye doctor, last visit 2023, glasses, goes annually, monitoring mild cataracts  3. Dr. Margarita Grizzle, dentist, last visit 2023, goes q69m   Patient Care Team: Lucky Cowboy, MD as PCP - General (Internal Medicine) Jodi Geralds, MD as Consulting Physician (Orthopedic Surgery) Rachael Fee, MD as Attending Physician (Gastroenterology) Andee Poles, MD as Consulting Physician (Psychiatry) Blima Ledger, OD (Optometry) Deatra Robinson, NP as Consulting Physician (Nurse Practitioner)  SURGICAL HISTORY She  has a past surgical history that includes Abdominal hysterectomy (2002); Joint replacement; Total knee arthroplasty (Right, 04/14/2013); Colonoscopy (2009); Total knee arthroplasty (Left, 01/06/2019); and Knee Arthroplasty. FAMILY HISTORY Her family history includes COPD in her father; CVA (age of onset: 56) in her maternal grandmother; Cancer in her father; Deep vein thrombosis in her maternal grandfather; Heart attack in her mother; Heart attack (age of onset: 41) in her maternal grandfather; Hypertension in her father and mother. SOCIAL HISTORY She  reports that she has never smoked. She has been exposed to tobacco smoke. She has never used smokeless tobacco. She reports that she does not drink alcohol and does not use drugs.   MEDICARE WELLNESS OBJECTIVES: Physical activity: Current Exercise Habits: Home exercise routine Cardiac risk factors:   Depression/mood screen:      12/23/2022   11:30 AM  Depression screen PHQ 2/9  Decreased Interest 0  Down, Depressed, Hopeless 0  PHQ - 2 Score 0    ADLs:     12/23/2022   11:30 AM 09/16/2022    9:11 PM  In your present state of health, do you have any difficulty performing the following activities:  Hearing? 0 1  Vision? 0 0  Difficulty concentrating or making decisions? 0 0  Walking or climbing stairs? 0 0  Dressing or bathing? 0 0  Doing errands, shopping? 0 0     Cognitive Testing  Alert? Yes  Normal Appearance?Yes  Oriented to person? Yes  Place? Yes   Time? Yes  Recall of three objects?  Yes  Can perform simple calculations? Yes  Displays appropriate judgment?Yes  Can read the correct time from a watch face?Yes  EOL planning:    Review of Systems  Constitutional:  Negative for malaise/fatigue and weight loss.  HENT:  Negative for congestion, hearing loss, sore  throat and tinnitus.   Eyes:  Negative for blurred vision and double vision.  Respiratory:  Negative for cough, sputum production, shortness of breath and wheezing.   Cardiovascular:  Negative for chest pain, palpitations, orthopnea, claudication, leg swelling and PND.  Gastrointestinal:  Negative for abdominal pain, blood in stool, constipation, diarrhea, heartburn, melena, nausea and vomiting.  Genitourinary: Negative.   Musculoskeletal:  Positive for joint pain (R foot, bil hands). Negative for falls and myalgias.  Skin:  Negative for rash.  Neurological:  Negative for dizziness, tingling, sensory change, weakness and headaches.  Endo/Heme/Allergies:  Negative for environmental allergies and polydipsia.  Psychiatric/Behavioral: Negative.  Negative for depression, memory loss, substance abuse and suicidal ideas. The patient is not nervous/anxious and does not have insomnia.   All other systems reviewed and are negative.    Objective:     Today's Vitals   12/23/22 1103  BP: (!) 140/76  Pulse: 76  Temp: 97.8 F (36.6 C)  SpO2: 99%  Weight: 199 lb (90.3 kg)  Height: 5\' 4"  (1.626 m)   Body mass index is 34.16 kg/m.  General appearance: alert, no distress, WD/WN, obese female HEENT: normocephalic, sclerae anicteric, TMs pearly, nares patent, no discharge or erythema, pharynx normal Oral cavity: MMM, no lesions Neck: supple, no lymphadenopathy, no thyromegaly, no masses Heart: RRR, normal S1, S2, no murmurs Lungs: CTA bilaterally, no wheezes, rhonchi, or rales Abdomen: +bs, soft, obese non tender, non distended, no masses, no hepatomegaly, no splenomegaly Musculoskeletal: Mild bony enlargement with swelling bil hand DIPs with mild swelling, otherwise no obvious deformity, no swelling, non-antalgic gait.  Extremities: no edema, no cyanosis, no clubbing Pulses: 2+ symmetric, upper and lower extremities, normal cap refill Neurological: alert, oriented x 3, CN2-12 intact, strength  normal upper extremities and lower extremities, sensation normal throughout, DTRs 2+ throughout, no cerebellar signs, gait antalgit Psychiatric: normal affect, behavior normal, pleasant, somewhat anxious   Medicare Attestation I have personally reviewed: The patient's medical and social history Their use of alcohol, tobacco or illicit drugs Their current medications and supplements The patient's functional ability including ADLs,fall risks, home safety risks, cognitive, and hearing and visual impairment Diet and physical activities Evidence for depression or mood disorders  The patient's weight, height, BMI, and visual acuity have been recorded in the chart.  I have made referrals, counseling, and provided education to the patient based on review of the above and I have provided the patient with a written personalized care plan for preventive services.     Adela Glimpse, NP   12/23/2022

## 2022-12-24 DIAGNOSIS — G4733 Obstructive sleep apnea (adult) (pediatric): Secondary | ICD-10-CM | POA: Diagnosis not present

## 2022-12-24 LAB — CBC WITH DIFFERENTIAL/PLATELET
Absolute Monocytes: 468 {cells}/uL (ref 200–950)
Basophils Absolute: 18 {cells}/uL (ref 0–200)
Basophils Relative: 0.4 %
Eosinophils Absolute: 140 {cells}/uL (ref 15–500)
Eosinophils Relative: 3.1 %
HCT: 40.7 % (ref 35.0–45.0)
Hemoglobin: 13.4 g/dL (ref 11.7–15.5)
Lymphs Abs: 1332 {cells}/uL (ref 850–3900)
MCH: 30 pg (ref 27.0–33.0)
MCHC: 32.9 g/dL (ref 32.0–36.0)
MCV: 91.1 fL (ref 80.0–100.0)
MPV: 10.9 fL (ref 7.5–12.5)
Monocytes Relative: 10.4 %
Neutro Abs: 2543 {cells}/uL (ref 1500–7800)
Neutrophils Relative %: 56.5 %
Platelets: 283 Thousand/uL (ref 140–400)
RBC: 4.47 Million/uL (ref 3.80–5.10)
RDW: 12.8 % (ref 11.0–15.0)
Total Lymphocyte: 29.6 %
WBC: 4.5 Thousand/uL (ref 3.8–10.8)

## 2022-12-24 LAB — COMPLETE METABOLIC PANEL WITHOUT GFR
AG Ratio: 1.7 (calc) (ref 1.0–2.5)
ALT: 30 U/L — ABNORMAL HIGH (ref 6–29)
AST: 23 U/L (ref 10–35)
Albumin: 4.3 g/dL (ref 3.6–5.1)
Alkaline phosphatase (APISO): 98 U/L (ref 37–153)
BUN: 17 mg/dL (ref 7–25)
CO2: 30 mmol/L (ref 20–32)
Calcium: 10.1 mg/dL (ref 8.6–10.4)
Chloride: 105 mmol/L (ref 98–110)
Creat: 0.66 mg/dL (ref 0.50–1.05)
Globulin: 2.6 g/dL (ref 1.9–3.7)
Glucose, Bld: 109 mg/dL — ABNORMAL HIGH (ref 65–99)
Potassium: 4.5 mmol/L (ref 3.5–5.3)
Sodium: 142 mmol/L (ref 135–146)
Total Bilirubin: 0.5 mg/dL (ref 0.2–1.2)
Total Protein: 6.9 g/dL (ref 6.1–8.1)
eGFR: 97 mL/min/1.73m2

## 2022-12-24 LAB — LIPID PANEL
Cholesterol: 121 mg/dL
HDL: 47 mg/dL — ABNORMAL LOW
LDL Cholesterol (Calc): 54 mg/dL
Non-HDL Cholesterol (Calc): 74 mg/dL
Total CHOL/HDL Ratio: 2.6 (calc)
Triglycerides: 114 mg/dL

## 2022-12-24 LAB — HEMOGLOBIN A1C
Hgb A1c MFr Bld: 6.2 %{Hb} — ABNORMAL HIGH
Mean Plasma Glucose: 131 mg/dL
eAG (mmol/L): 7.3 mmol/L

## 2023-01-01 ENCOUNTER — Other Ambulatory Visit: Payer: Self-pay | Admitting: *Deleted

## 2023-01-01 DIAGNOSIS — M0609 Rheumatoid arthritis without rheumatoid factor, multiple sites: Secondary | ICD-10-CM

## 2023-01-01 MED ORDER — HYDROXYCHLOROQUINE SULFATE 200 MG PO TABS
ORAL_TABLET | ORAL | 0 refills | Status: DC
Start: 2023-01-01 — End: 2023-03-29

## 2023-01-01 NOTE — Telephone Encounter (Signed)
Refill request received via fax from [facility] for PLQ   Last Fill: 10/08/2022  Eye exam: 11/13/2022 WNL   Labs: 12/23/2022 Glucose 109, ALT 30  Next Visit: 04/14/2023  Last Visit: 11/11/2022  [id number] arthritis of multiple sites with negative rheumatoid factor   Current Dose per office note 11/11/2022: Plaquenil 200 mg 1 tablet by mouth twice daily Monday to Friday.   Okay to refill Plaquenil?

## 2023-01-13 NOTE — Progress Notes (Signed)
PATIENT: [name] DOB: [birth year]  REASON FOR VISIT: follow up HISTORY FROM: patient  Chief Complaint  Patient presents with   Obstructive Sleep Apnea    Rm 4 alone Pt is well and stable, no concerns with OSA/CPAP      HISTORY OF PRESENT ILLNESS:  01/14/23 ALL:  [name] is a 67 y.o. female here today for follow up for OSA on CPAP.  She was seen in consult with Dr Vickey Huger 07/2022 in need of new autoPAP machine. Split night study 10/2022 showed severe OSA with AHI 36.3/hr. Apnea resolved at 12cmH20. AutoPAP was ordered with setting of 6-13cmH20 and N20 mask in small. Since, she reports doing very well. She is sleeping much better. She is very happy with new machine and feels mask fits well. She denies concerns with machine or supplies.      HISTORY: (copied from Dr Dohmeier's previous note)  [name] is a 67 y.o. female patient who is seen upon referral on 08/10/2022 from Dr Oneta Rack, MD for transfer of sleep medicine Care for a patient on OSA / CPAP care for 2 decades. Her insomnia medications are filled by NP Jones.  .  Chief concern according to patient : " I depend on CPAP, I can't breathe without it- when I forget to take my CPAP I need to sleep elevated , seated and snore loudly".    I have the pleasure of seeing [name] 08/10/22 a right -handed female with a possible sleep disorder.    The patient had the first sleep study in the year 2000 , she thinks - it was on [address], [facility]-    Mrs Sohmer is using an autotitration CPAP device by [employer] on factory settings, 5-20 cm water, 3 cm EPR, residual AHI was 2.9/h. her compliance for 90 days was 94% . She  uses a nasal mask- has congestion,  and condensation water- stopped using water. Lots of air leaks. Needs new mask and headgear, HST or attended sleep study to see baseline, which has not been investigated in 20 years.    Sleep relevant medical history: Nocturia 2-3 times on CPAP- dry  mouth, bioteen, snoring, no Sleep walking, no ENT procedures- no Tonsillectomy no cervical spine surgery, no allergies.     Family medical /sleep history: Brother is another family member with OSA,  CPAP is not used .  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  and lives in a household with spouse . Family status is married , with 2 adult children,  6 grandchildren.  A dog is present.chickens outside.  Tobacco use: none .  ETOH use ; none,  Caffeine intake in form of Coffee( mostly decaff) Soda( /) Tea ( /) or energy drinks Exercise in form of gardening, walking - but is SOB a lot. .   Hobbies : chicks.    Sleep habits are as follows: The patient's dinner time is between 5-7 PM. The patient goes to bed at 11 PM and struggles with sleep onset- continues to sleep for 8 hours ( I am restless, fighting with the CPAP) , wakes for several  bathroom breaks, the first time at 2 AM.   The preferred sleep position is supine or laterally-, with the support of 1 pillow. She has arthritis pain in hands-  Dreams are reportedly rare.  The patient wakes up spontaneously at 7.30 , 8  AM is the usual rise time. She  reports recently not feeling refreshed or restored in AM, with symptoms such as dry mouth, congested nose, and frequent  morning headaches, and residual fatigue.  Naps are taken frequently, lasting from 10 to 25 minutes and are more refreshing than nocturnal sleep.    REVIEW OF SYSTEMS: Out of a complete 14 system review of symptoms, the patient complains only of the following symptoms, fatigue, depression and all other reviewed systems are negative.  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     acyclovir (ZOVIRAX) 400 MG tablet Take 1 tablet (400 mg total) by mouth 3 (three) times daily as needed (fever blisters). 21 tablet 1   aspirin EC 81 MG tablet Take 81 mg by mouth daily.     cetirizine (ZYRTEC) 10 MG tablet Take 10 mg by mouth daily.      Cholecalciferol (VITAMIN D PO) Take 8,000 Int'l Units by mouth daily. 2000 units per capsule     CINNAMON PO Take 2,000 mg by mouth daily.     clonazePAM (KLONOPIN) 1 MG tablet Take 1 mg by mouth at bedtime as needed for anxiety.      diclofenac Sodium (VOLTAREN) 1 % GEL Apply topically daily.     esomeprazole (NEXIUM) 40 MG capsule Take 40 mg by mouth daily.     glucose blood (ACCU-CHEK AVIVA PLUS) test strip CHECK BLOOD SUGAR 1 TIME DAILY. DX-R73.03 100 each 1   hydrochlorothiazide (HYDRODIURIL) 25 MG tablet TAKE 1 TABLET BY MOUTH DAILY FOR BLOOD PRESSURE AND FLUID RETENTION OR ANKLE SWELLING 90 tablet 3   hydroxychloroquine (PLAQUENIL) 200 MG tablet TAKE 1 TABLET BY MOUTH TWICE DAILY( EVERY 12 HOURS) MONDAY TO FRIDAY 120 tablet 0   ibuprofen (ADVIL) 200 MG tablet Take 200 mg by mouth every 6 (six) hours as needed.     Lancets (ACCU-CHEK MULTICLIX) lancets Use to check blood glucose daily 100 each PRN   Lysine 500 MG TABS Take 1,000 mg by mouth daily. Currently taking 2 pill QD     Magnesium 500 MG TABS Take 2,000 mg by mouth daily. Takes 4 tablets daily     Multiple Vitamin (MULTIVITAMIN WITH MINERALS) TABS tablet Take 0.5 tablets by mouth 2 (two) times daily.     ondansetron (ZOFRAN) 4 MG tablet Take 1 tablet (4 mg total) by mouth daily as needed for nausea or vomiting. 30 tablet 1   QUEtiapine (SEROQUEL) 100 MG tablet Take 100  mg by mouth 3 (three) times daily.     rosuvastatin (CRESTOR) 40 MG tablet TAKE 1 TABLET BY MOUTH DAILY FOR CHOLESTEROL (Patient taking differently: TAKE 1/2 TABLET BY MOUTH DAILY FOR CHOLESTEROL) 90 tablet 3   vitamin C (ASCORBIC ACID) 500 MG tablet Take 500 mg by mouth daily.     zinc gluconate 50  MG tablet Take 50 mg by mouth daily.     lamoTRIgine (LAMICTAL) 150 MG tablet Take 1 & 1/2 tablets at bedtime (Patient taking differently: once. Take 1 tablet at bedtime) 135 tablet 1   No facility-administered medications prior to visit.    PAST MEDICAL HISTORY: Past Medical History:  Diagnosis Date   Fibromyalgia    Hypertension    S/P total knee arthroplasty, left 01/07/2019   Simple renal cyst 12/12/2019   Incidentally noted on abd [place] 11/2019 - radiologist recommended further characterization with MRI w & w/o contrast   Hypoechoic lesions within the kidney which may simply represent mildly complicated cysts. The largest of these lies in the midportion of the kidney with increased echogenicity within and suggestion of internal color flow which may be related to septation. MRI of the kidneys with and w   Status post total shoulder arthroplasty, left 01/07/2019   Vitamin D deficiency     PAST SURGICAL HISTORY: Past Surgical History:  Procedure Laterality Date   ABDOMINAL HYSTERECTOMY  2002   Dr. Chari Manning    COLONOSCOPY  2009   JOINT REPLACEMENT     arthroscopy   KNEE ARTHROPLASTY     TOTAL KNEE ARTHROPLASTY Right 04/14/2013   Procedure: RIGHT TOTAL KNEE ARTHROPLASTY AND LEFT KNEE INJECTION ;  Surgeon: Harvie Junior, MD;  Location: [facility] OR;  Service: Orthopedics;  Laterality: Right;   TOTAL KNEE ARTHROPLASTY Left 01/06/2019   Procedure: TOTAL KNEE ARTHROPLASTY;  Surgeon: Jodi Geralds, MD;  Location: [facility];  Service: Orthopedics;  Laterality: Left;    FAMILY HISTORY: Family History  Problem Relation Age of Onset   Hypertension Mother    Heart attack Mother    Hypertension Father    COPD  Father    Cancer Father        thyroid   CVA Maternal Grandmother 89   Heart attack Maternal Grandfather 67   Deep vein thrombosis Maternal Grandfather    Colon cancer Neg Hx    Esophageal cancer Neg Hx    Stomach cancer Neg Hx     SOCIAL HISTORY: Social History   Socioeconomic History   Marital status: Married    Spouse name: Not on file   Number of children: Not on file   Years of education: Not on file   Highest education level: Not on file  Occupational History   Not on file  Tobacco Use   Smoking status: Never    Passive exposure: Past   Smokeless tobacco: Never  Vaping Use   Vaping status: Never Used  Substance and Sexual Activity   Alcohol use: No   Drug use: No   Sexual activity: Not on file  Other Topics Concern   Not on file  Social History Narrative   Not on file   Social Determinants of Health   Financial Resource Strain: Not on file  Food Insecurity: Not on file  Transportation Needs: Not on file  Physical Activity: Not on file  Stress: Not on file  Social Connections: Not on file  Intimate Partner Violence: Not on file     PHYSICAL EXAM  Vitals:   01/14/23 0840  BP: 137/78  Pulse: 77  Weight: 198 lb (89.8 kg)  Height: 5\' 4"  (1.626 m)   Body mass index is 33.99 kg/m.  Generalized: Well developed, in no acute distress  Cardiology: normal rate and rhythm, no murmur noted Respiratory: clear to auscultation bilaterally  Neurological examination  Mentation: Alert oriented to time, place,  history taking. Follows all commands speech and language fluent Cranial nerve II-XII: Pupils were equal round reactive to light. Extraocular movements were full, visual field were full  Motor: The motor testing reveals 5 over 5 strength of all 4 extremities. Good symmetric motor tone is noted throughout.  Gait and station: Gait is normal.    DIAGNOSTIC DATA (LABS, IMAGING, TESTING) - I reviewed patient records, labs, notes, testing and imaging myself  where available.      No data to display           Lab Results  Component Value Date   WBC 4.5 12/23/2022   HGB 13.4 12/23/2022   HCT 40.7 12/23/2022   MCV 91.1 12/23/2022   PLT 283 12/23/2022      Component Value Date/Time   NA 142 12/23/2022 1137   K 4.5 12/23/2022 1137   CL 105 12/23/2022 1137   CO2 30 12/23/2022 1137   GLUCOSE 109 (H) 12/23/2022 1137   BUN 17 12/23/2022 1137   CREATININE 0.66 12/23/2022 1137   CALCIUM 10.1 12/23/2022 1137   PROT 6.9 12/23/2022 1137   ALBUMIN 4.7 01/03/2019 1350   AST 23 12/23/2022 1137   ALT 30 (H) 12/23/2022 1137   ALKPHOS 89 01/03/2019 1350   BILITOT 0.5 12/23/2022 1137   GFRNONAA 89 09/02/2020 1048   GFRAA 103 09/02/2020 1048   Lab Results  Component Value Date   CHOL 121 12/23/2022   HDL 47 (L) 12/23/2022   LDLCALC 54 12/23/2022   TRIG 114 12/23/2022   CHOLHDL 2.6 12/23/2022   Lab Results  Component Value Date   HGBA1C 6.2 (H) 12/23/2022   Lab Results  Component Value Date   VITAMINB12 524 04/29/2016   Lab Results  Component Value Date   TSH 1.51 09/16/2022     ASSESSMENT AND PLAN 67 y.o. year old female  has a past medical history of Fibromyalgia, Hypertension, S/P total knee arthroplasty, left (01/07/2019), Simple renal cyst (12/12/2019), Status post total shoulder arthroplasty, left (01/07/2019), and Vitamin D deficiency. here with     ICD-10-CM   1. OSA on CPAP  G47.33        [name] is doing well on CPAP therapy. Compliance report reveals excellent compliance. She was encouraged to continue using CPAP nightly and for greater than 4 hours each night. We will update supply orders as indicated. Risks of untreated sleep apnea review and education materials provided. Healthy lifestyle habits encouraged. She will follow up in 1 year, sooner if needed. She verbalizes understanding and agreement with this plan.    No orders of the defined types were placed in this encounter.    No orders of the defined  types were placed in this encounter.     Shawnie Dapper, FNP-C 01/14/2023, 8:47 AM [facility] [address], [address] [place], [place] [address] [phone]

## 2023-01-13 NOTE — Patient Instructions (Signed)

## 2023-01-14 ENCOUNTER — Ambulatory Visit: Payer: Medicare PPO | Admitting: Family Medicine

## 2023-01-14 ENCOUNTER — Encounter: Payer: Self-pay | Admitting: Family Medicine

## 2023-01-14 VITALS — BP 137/78 | HR 77 | Ht 64.0 in | Wt 198.0 lb

## 2023-01-14 DIAGNOSIS — G4733 Obstructive sleep apnea (adult) (pediatric): Secondary | ICD-10-CM

## 2023-01-20 ENCOUNTER — Other Ambulatory Visit: Payer: Self-pay | Admitting: Internal Medicine

## 2023-01-20 ENCOUNTER — Other Ambulatory Visit: Payer: Self-pay | Admitting: Nurse Practitioner

## 2023-01-24 DIAGNOSIS — G4733 Obstructive sleep apnea (adult) (pediatric): Secondary | ICD-10-CM | POA: Diagnosis not present

## 2023-02-08 ENCOUNTER — Emergency Department (HOSPITAL_COMMUNITY): Payer: Medicare PPO

## 2023-02-08 ENCOUNTER — Other Ambulatory Visit: Payer: Self-pay

## 2023-02-08 ENCOUNTER — Encounter (HOSPITAL_COMMUNITY): Payer: Self-pay

## 2023-02-08 ENCOUNTER — Emergency Department (HOSPITAL_COMMUNITY)
Admission: EM | Admit: 2023-02-08 | Discharge: 2023-02-08 | Disposition: A | Discharge status: Home / Self Care | Payer: Medicare PPO | Attending: Emergency Medicine | Admitting: Emergency Medicine

## 2023-02-08 DIAGNOSIS — R1032 Left lower quadrant pain: Secondary | ICD-10-CM | POA: Insufficient documentation

## 2023-02-08 DIAGNOSIS — R1033 Periumbilical pain: Secondary | ICD-10-CM | POA: Insufficient documentation

## 2023-02-08 DIAGNOSIS — R1031 Right lower quadrant pain: Secondary | ICD-10-CM | POA: Insufficient documentation

## 2023-02-08 DIAGNOSIS — R102 Pelvic and perineal pain: Secondary | ICD-10-CM | POA: Insufficient documentation

## 2023-02-08 DIAGNOSIS — R109 Unspecified abdominal pain: Secondary | ICD-10-CM | POA: Diagnosis not present

## 2023-02-08 DIAGNOSIS — I10 Essential (primary) hypertension: Secondary | ICD-10-CM | POA: Insufficient documentation

## 2023-02-08 DIAGNOSIS — Z90710 Acquired absence of both cervix and uterus: Secondary | ICD-10-CM | POA: Diagnosis not present

## 2023-02-08 LAB — URINALYSIS, ROUTINE W REFLEX MICROSCOPIC
Bilirubin Urine: NEGATIVE
Glucose, UA: NEGATIVE mg/dL
Hgb urine dipstick: NEGATIVE
Ketones, ur: 5 mg/dL — AB
Leukocytes,Ua: NEGATIVE
Nitrite: NEGATIVE
Protein, ur: NEGATIVE mg/dL
Specific Gravity, Urine: 1.019 (ref 1.005–1.030)
pH: 6 (ref 5.0–8.0)

## 2023-02-08 LAB — COMPREHENSIVE METABOLIC PANEL
ALT: 41 U/L (ref 0–44)
AST: 32 U/L (ref 15–41)
Albumin: 4.6 g/dL (ref 3.5–5.0)
Alkaline Phosphatase: 101 U/L (ref 38–126)
Anion gap: 9 (ref 5–15)
BUN: 16 mg/dL (ref 8–23)
CO2: 26 mmol/L (ref 22–32)
Calcium: 9.7 mg/dL (ref 8.9–10.3)
Chloride: 105 mmol/L (ref 98–111)
Creatinine, Ser: 0.73 mg/dL (ref 0.44–1.00)
GFR, Estimated: >60 mL/min (ref >60)
Glucose, Bld: 108 mg/dL — ABNORMAL HIGH (ref 70–99)
Potassium: 4.1 mmol/L (ref 3.5–5.1)
Sodium: 140 mmol/L (ref 135–145)
Total Bilirubin: 0.7 mg/dL (ref 0.3–1.2)
Total Protein: 7.7 g/dL (ref 6.5–8.1)

## 2023-02-08 LAB — CBC
HCT: 44.7 % (ref 36.0–46.0)
Hemoglobin: 14.8 g/dL (ref 12.0–15.0)
MCH: 30.3 pg (ref 26.0–34.0)
MCHC: 33.1 g/dL (ref 30.0–36.0)
MCV: 91.6 fL (ref 80.0–100.0)
Platelets: 309 10*3/uL (ref 150–400)
RBC: 4.88 MIL/uL (ref 3.87–5.11)
RDW: 14.1 % (ref 11.5–15.5)
WBC: 6.2 10*3/uL (ref 4.0–10.5)
nRBC: 0 % (ref 0.0–0.2)

## 2023-02-08 LAB — LIPASE, BLOOD: Lipase: 33 U/L (ref 11–51)

## 2023-02-08 MED ORDER — ACETAMINOPHEN 500 MG PO TABS
1000.0000 mg | ORAL_TABLET | Freq: Once | ORAL | Status: AC
  Administered 2023-02-08: 1000 mg via ORAL
  Filled 2023-02-08: qty 2

## 2023-02-08 MED ORDER — DIAZEPAM 2 MG PO TABS: 2.0000 mg | ORAL_TABLET | Freq: Four times a day (QID) | ORAL | 0 refills | Status: DC | PRN

## 2023-02-08 MED ORDER — FENTANYL CITRATE PF 50 MCG/ML IJ SOSY
100.0000 ug | PREFILLED_SYRINGE | Freq: Once | INTRAMUSCULAR | Status: AC
  Administered 2023-02-08: 100 ug via INTRAVENOUS
  Filled 2023-02-08: qty 2

## 2023-02-08 MED ORDER — DIAZEPAM 5 MG/ML IJ SOLN
10.0000 mg | Freq: Once | INTRAMUSCULAR | Status: AC
  Administered 2023-02-08: 10 mg via INTRAVENOUS
  Filled 2023-02-08: qty 2

## 2023-02-08 MED ORDER — LACTATED RINGERS IV BOLUS
1000.0000 mL | Freq: Once | INTRAVENOUS | Status: AC
  Administered 2023-02-08: 1000 mL via INTRAVENOUS

## 2023-02-08 MED ORDER — DIPHENHYDRAMINE HCL 50 MG/ML IJ SOLN
50.0000 mg | Freq: Four times a day (QID) | INTRAMUSCULAR | Status: DC | PRN
  Administered 2023-02-08: 50 mg via INTRAVENOUS
  Filled 2023-02-08: qty 1

## 2023-02-08 MED ORDER — KETOROLAC TROMETHAMINE 15 MG/ML IJ SOLN
15.0000 mg | Freq: Once | INTRAMUSCULAR | Status: AC
  Administered 2023-02-08: 15 mg via INTRAVENOUS
  Filled 2023-02-08: qty 1

## 2023-02-08 MED ORDER — IOHEXOL 300 MG/ML  SOLN
100.0000 mL | Freq: Once | INTRAMUSCULAR | Status: AC | PRN
  Administered 2023-02-08: 100 mL via INTRAVENOUS

## 2023-02-08 NOTE — ED Triage Notes (Signed)
Patient began having lower centralized abdominal pain moving into her pelvis that began 3 hours ago. No vomiting or diarrhea. Feels nauseous.

## 2023-02-08 NOTE — ED Provider Notes (Signed)
[place] EMERGENCY DEPARTMENT AT [facility] Provider Note   CSN: [id number] Arrival date & time: 02/08/23  1259     History Chief Complaint  Patient presents with   Abdominal Pain    HPI [name] is a 67 y.o. female presenting for chief complaint of abdominal pain. 67 year old female delightful.  States that she had sudden onset pelvic pain while working in the garden today.  Status post hysterectomy.  Has a history of similar severe pain but this seems more pronounced than her baseline.  It is bilateral lower pelvic to lower quadrants.  She is endorsing severe dysuria and urinary frequency this week.  3-4 episodes of urination today which is atypical for the patient. History of hypertension, depression, OSA, NAFLD, HLD.  On antihypertensives, anxiety medications, Lamictal, Seroquel Patient's recorded medical, surgical, social, medication list and allergies were reviewed in the Snapshot window as part of the initial history.   Review of Systems   Review of Systems  Constitutional:  Positive for fatigue. Negative for chills and fever.  HENT:  Negative for ear pain and sore throat.   Eyes:  Negative for pain and visual disturbance.  Respiratory:  Positive for cough and shortness of breath.   Cardiovascular:  Negative for chest pain and palpitations.  Gastrointestinal:  Negative for abdominal pain and vomiting.  Genitourinary:  Negative for dysuria and hematuria.  Musculoskeletal:  Negative for arthralgias and back pain.  Skin:  Negative for color change and rash.  Neurological:  Negative for seizures and syncope.  All other systems reviewed and are negative.   Physical Exam Updated Vital Signs BP (!) 163/75   Pulse 72   Temp 97.9 F (36.6 C) (Oral)   Resp 16   Ht 5\' 4"  (1.626 m)   Wt 88.5 kg   SpO2 98%   BMI 33.47 kg/m  Physical Exam Vitals and nursing note reviewed.  Constitutional:      General: She is not in acute distress.    Appearance: She  is well-developed.  HENT:     Head: Normocephalic and atraumatic.  Eyes:     Conjunctiva/sclera: Conjunctivae normal.  Cardiovascular:     Rate and Rhythm: Normal rate and regular rhythm.     Heart sounds: No murmur heard. Pulmonary:     Effort: Pulmonary effort is normal. No respiratory distress.     Breath sounds: Normal breath sounds.  Abdominal:     General: There is no distension.     Palpations: Abdomen is soft.     Tenderness: There is abdominal tenderness in the right lower quadrant, periumbilical area, suprapubic area and left lower quadrant. There is guarding. There is no right CVA tenderness or left CVA tenderness. Negative signs include Murphy's sign.  Musculoskeletal:        General: No swelling or tenderness. Normal range of motion.     Cervical back: Neck supple.  Skin:    General: Skin is warm and dry.  Neurological:     General: No focal deficit present.     Mental Status: She is alert and oriented to person, place, and time. Mental status is at baseline.     Cranial Nerves: No cranial nerve deficit.      ED Course/ Medical Decision Making/ A&P    Procedures Ultrasound ED Renal  Date/Time: 02/08/2023 4:20 PM  Performed by: Glyn Ade, MD Authorized by: Glyn Ade, MD   Procedure details:    Indications: urinary tract infection  Technique:  BladderImages: archivedStudy Limitations: body habitus Bladder findings:    Bladder:  Visualized   Free pelvic fluid: not identified     Volume:  APPEARS COLLAPSED    Medications Ordered in ED Medications  diphenhydrAMINE (BENADRYL) injection 50 mg (50 mg Intravenous Given 02/08/23 1706)  lactated ringers bolus 1,000 mL (0 mLs Intravenous Stopped 02/08/23 1858)  fentaNYL (SUBLIMAZE) injection 100 mcg (100 mcg Intravenous Given 02/08/23 1638)  iohexol (OMNIPAQUE) 300 MG/ML solution 100 mL (100 mLs Intravenous Contrast Given 02/08/23 1649)  ketorolac (TORADOL) 15 MG/ML injection 15 mg (15 mg  Intravenous Given 02/08/23 1825)  acetaminophen (TYLENOL) tablet 1,000 mg (1,000 mg Oral Given 02/08/23 1825)  fentaNYL (SUBLIMAZE) injection 100 mcg (100 mcg Intravenous Given 02/08/23 1858)  diazepam (VALIUM) injection 10 mg (10 mg Intravenous Given 02/08/23 2146)   Medical Decision Making:   [name] is a 67 y.o. female who presented to the ED today with abdominal pain, detailed above.    Complete initial physical exam performed, notably the patient  was hemodynamically stable in no acute distress.    Performed a point-of-care ultrasound to evaluate for urinary retention as her history was raising concern for.  However ultrasound with no visible bladder though she has significant bowel gas blocking use. Reviewed and confirmed nursing documentation for past medical history, family history, social history.    Initial Assessment:   With the patient's presentation of abdominal pain, most likely diagnosis is musculoskeletal etiology such as pelvic floor dysfunction. Other diagnoses were considered including (but not limited to) gastroenteritis, colitis, small bowel obstruction, appendicitis, cholecystitis, pancreatitis, nephrolithiasis, UTI, pyelonephritis. These are considered less likely due to history of present illness and physical exam findings.   This is most consistent with an acute life/limb threatening illness complicated by underlying chronic conditions.   Initial Plan:  CBC/CMP to evaluate for underlying infectious/metabolic etiology for patient's abdominal pain  Lipase to evaluate for pancreatitis  CTAB/Pelvis with contrast to evaluate for structural/surgical etiology of patients' severe abdominal pain. Will add on pelvic ultrasound to evaluate for pelvic pathology Urinalysis and repeat physical assessment to evaluate for UTI/Pyelonpehritis  Empiric management of symptoms with escalating pain control and antiemetics as needed.   Initial Study Results:   Laboratory  All  laboratory results reviewed without evidence of clinically relevant pathology.    Radiology All images reviewed independently. Agree with radiology report at this time.   US PELVIC COMPLETE WITH TRANSVAGINAL  Result Date: 02/08/2023 CLINICAL DATA:  Unspecified abdominal pain EXAM: TRANSABDOMINAL AND TRANSVAGINAL ULTRASOUND OF PELVIS TECHNIQUE: Both transabdominal and transvaginal ultrasound examinations of the pelvis were performed. Transabdominal technique was performed for global imaging of the pelvis including uterus, ovaries, adnexal regions, and pelvic cul-de-sac. It was necessary to proceed with endovaginal exam following the transabdominal exam to visualize the adnexa bilaterally. COMPARISON:  Concurrently performed CT examination of the abdomen and pelvis. FINDINGS: Uterus Absent Endometrium Not applicable Right ovary Not visualized.  No adnexal mass. Left ovary Not visualized.  No adnexal mass. Other findings No abnormal free fluid. IMPRESSION: 1. Status post hysterectomy. Nonvisualization of the ovaries. No adnexal mass. Electronically Signed   By: Helyn Numbers M.D.   On: 02/08/2023 20:30   CT ABDOMEN PELVIS W CONTRAST  Result Date: 02/08/2023 CLINICAL DATA:  Abdominal pain.  Lower pelvic pain for 3 hours. EXAM: CT ABDOMEN AND PELVIS WITH CONTRAST TECHNIQUE: Multidetector CT imaging of the abdomen and pelvis was performed using the standard protocol following bolus administration of intravenous contrast. RADIATION  DOSE REDUCTION: This exam was performed according to the departmental dose-optimization program which includes automated exposure control, adjustment of the mA and/or kV according to patient size and/or use of iterative reconstruction technique. CONTRAST:  OMNIPAQUE IOHEXOL 300 MG/ML  SOLN COMPARISON:  None Available. FINDINGS: Lower chest: Lung bases are clear. Hepatobiliary: No focal hepatic lesion. Normal gallbladder. No biliary duct dilatation. Common bile duct is normal.  Pancreas: Pancreas is normal. No ductal dilatation. No pancreatic inflammation. Spleen: Normal spleen Adrenals/urinary tract: Adrenal glands and kidneys are normal. The ureters and bladder normal. Stomach/Bowel: Stomach, small bowel, appendix, and cecum are normal. The colon and rectosigmoid colon are normal. Vascular/Lymphatic: Abdominal aorta is normal caliber. No periportal or retroperitoneal adenopathy. No pelvic adenopathy. Reproductive: Post hysterectomy.  Adnexa unremarkable Other: No inguinal hernia.  No ventral hernia. Musculoskeletal: No aggressive osseous lesion. IMPRESSION: 1. No acute findings in the abdomen pelvis. 2. Normal appendix. 3. Post hysterectomy. Electronically Signed   By: Genevive Bi M.D.   On: 02/08/2023 17:29    Final Reassessment and Plan:   Reassessed at the bedside administration of the above medications, she has had complete resolution of her pain.  I think this is musculoskeletal in etiology and would consider pelvic floor dysfunction is a possibility though the severity seems less likely.  Notably her objective findings are nondiagnostic. She is well-appearing.  Given resolution of symptoms I will refer her to gynecology and her PCP in the outpatient setting for further care and management.    Clinical Impression:  1. Pelvic pain      Discharge   Final Clinical Impression(s) / ED Diagnoses Final diagnoses:  Pelvic pain    Rx / DC Orders ED Discharge Orders          Ordered    diazepam (VALIUM) 2 MG tablet  Every 6 hours PRN        02/08/23 2255              Glyn Ade, MD 02/08/23 2348

## 2023-02-08 NOTE — ED Notes (Signed)
Pt placed on 3lpm O2 via [place] for drop in O2 sat after med administration

## 2023-02-10 ENCOUNTER — Ambulatory Visit: Payer: Medicare PPO | Admitting: Nurse Practitioner

## 2023-02-10 ENCOUNTER — Encounter: Payer: Self-pay | Admitting: Nurse Practitioner

## 2023-02-10 VITALS — BP 124/72 | HR 85 | Temp 97.7°F | Ht 64.0 in | Wt 196.0 lb

## 2023-02-10 DIAGNOSIS — R102 Pelvic and perineal pain: Secondary | ICD-10-CM

## 2023-02-10 NOTE — Progress Notes (Signed)
Hospital follow up  Assessment and Plan: Hospital visit follow up for:   Pelvic pain Refer to Pelvic Floor Rehab/PT Continue Valium as needed as directed - limit use to decrease risk for addiction - discussed adding Bentyl.   Continue to monitor Contact office if s/s fail to improve.    All medications were reviewed with patient and family and fully reconciled. All questions answered fully, and patient and family members were encouraged to call the office with any further questions or concerns. Discussed goal to avoid readmission related to this diagnosis.   Over 30 minutes of exam, counseling, chart review, and complex, high/moderate level critical decision making was performed this visit.   Future Appointments  Date Time Provider Department Center  02/10/2023 11:00 AM Vanessa Glimpse, NP [facility] None  03/25/2023 11:30 AM Lucky Cowboy, MD [facility] None  04/14/2023  9:30 AM Gearldine Bienenstock, PA-C [facility] None  06/30/2023 10:30 AM Vanessa Glimpse, NP [facility] None  09/30/2023 10:00 AM Lucky Cowboy, MD [facility] None  12/23/2023 11:30 AM Vanessa Glimpse, NP [facility] None  01/20/2024 10:00 AM Lomax, Amy, NP [facility] None     HPI 67 y.o.female presents for follow up for transition from recent hospitalization or SNIF stay. Admit date to the hospital was 02/08/23, patient was discharged from the hospital on 02/08/23 and our clinical staff contacted the office the day after discharge to set up a follow up appointment. The discharge summary, medications, and diagnostic test results were reviewed before meeting with the patient. The patient was admitted for pelvic pain:   States that she had sudden onset pelvic pain while working in the garden today. Status post hysterectomy. Has a history of similar severe pain but this seems more pronounced than her baseline. It is bilateral lower pelvic to lower quadrants. She is endorsing severe dysuria and urinary frequency this week. 3-4 episodes of  urination today which is atypical for the patient.   She had an [place] to evaluate for urinary retention however bladder was noted to be normal despite significan bowel gas blocking use.  She was diagnosed with pelvic floory dysfuntion.  Lab work was completed to evaluate for underlying infecious/metabolic etiology including lipse.  CT abdomen and pelvis was also completed for severe abdominal pain as well as pelvic ultrasound.  Pelvic US was negative. CT Abdomen was negative.  She was asked to follow up with GYN and PCP.    Today she is continuing to have a 5-6/10 pelvic pain even with assistance of Valium.   Images while in the hospital: US PELVIC COMPLETE WITH TRANSVAGINAL  Result Date: 02/08/2023 CLINICAL DATA:  Unspecified abdominal pain EXAM: TRANSABDOMINAL AND TRANSVAGINAL ULTRASOUND OF PELVIS TECHNIQUE: Both transabdominal and transvaginal ultrasound examinations of the pelvis were performed. Transabdominal technique was performed for global imaging of the pelvis including uterus, ovaries, adnexal regions, and pelvic cul-de-sac. It was necessary to proceed with endovaginal exam following the transabdominal exam to visualize the adnexa bilaterally. COMPARISON:  Concurrently performed CT examination of the abdomen and pelvis. FINDINGS: Uterus Absent Endometrium Not applicable Right ovary Not visualized.  No adnexal mass. Left ovary Not visualized.  No adnexal mass. Other findings No abnormal free fluid. IMPRESSION: 1. Status post hysterectomy. Nonvisualization of the ovaries. No adnexal mass. Electronically Signed   By: Helyn Numbers M.D.   On: 02/08/2023 20:30   CT ABDOMEN PELVIS W CONTRAST  Result Date: 02/08/2023 CLINICAL DATA:  Abdominal pain.  Lower pelvic pain for 3 hours. EXAM: CT ABDOMEN AND PELVIS WITH CONTRAST  TECHNIQUE: Multidetector CT imaging of the abdomen and pelvis was performed using the standard protocol following bolus administration of intravenous contrast. RADIATION DOSE  REDUCTION: This exam was performed according to the departmental dose-optimization program which includes automated exposure control, adjustment of the mA and/or kV according to patient size and/or use of iterative reconstruction technique. CONTRAST:  OMNIPAQUE IOHEXOL 300 MG/ML  SOLN COMPARISON:  None Available. FINDINGS: Lower chest: Lung bases are clear. Hepatobiliary: No focal hepatic lesion. Normal gallbladder. No biliary duct dilatation. Common bile duct is normal. Pancreas: Pancreas is normal. No ductal dilatation. No pancreatic inflammation. Spleen: Normal spleen Adrenals/urinary tract: Adrenal glands and kidneys are normal. The ureters and bladder normal. Stomach/Bowel: Stomach, small bowel, appendix, and cecum are normal. The colon and rectosigmoid colon are normal. Vascular/Lymphatic: Abdominal aorta is normal caliber. No periportal or retroperitoneal adenopathy. No pelvic adenopathy. Reproductive: Post hysterectomy.  Adnexa unremarkable Other: No inguinal hernia.  No ventral hernia. Musculoskeletal: No aggressive osseous lesion. IMPRESSION: 1. No acute findings in the abdomen pelvis. 2. Normal appendix. 3. Post hysterectomy. Electronically Signed   By: Genevive Bi M.D.   On: 02/08/2023 17:29      Current Outpatient Medications (Cardiovascular):    hydrochlorothiazide (HYDRODIURIL) 25 MG tablet, TAKE 1 TABLET BY MOUTH DAILY FOR BLOOD PRESSURE AND FLUID RETENTION OR ANKLE SWELLING   rosuvastatin (CRESTOR) 40 MG tablet, TAKE 1 TABLET BY MOUTH DAILY FOR CHOLESTEROL (Patient taking differently: TAKE 1/2 TABLET BY MOUTH DAILY FOR CHOLESTEROL)  Current Outpatient Medications (Respiratory):    cetirizine (ZYRTEC) 10 MG tablet, Take 10 mg by mouth daily.   Current Outpatient Medications (Analgesics):    acetaminophen (TYLENOL) 500 MG tablet, Take 500 mg by mouth every 6 (six) hours as needed.   aspirin EC 81 MG tablet, Take 81 mg by mouth daily.   ibuprofen (ADVIL) 200 MG tablet, Take  200 mg by mouth every 6 (six) hours as needed.   Current Outpatient Medications (Other):    acyclovir (ZOVIRAX) 400 MG tablet, Take 1 tablet (400 mg total) by mouth 3 (three) times daily as needed (fever blisters).   Cholecalciferol (VITAMIN D PO), Take 8,000 Int'l Units by mouth daily. 2000 units per capsule   CINNAMON PO, Take 2,000 mg by mouth daily.   clonazePAM (KLONOPIN) 1 MG tablet, Take 1 mg by mouth at bedtime as needed for anxiety.    diazepam (VALIUM) 2 MG tablet, Take 1 tablet (2 mg total) by mouth every 6 (six) hours as needed (Muscle Spasm).   diclofenac Sodium (VOLTAREN) 1 % GEL, Apply topically daily.   esomeprazole (NEXIUM) 40 MG capsule, TAKE 1 CAPSULE BY MOUTH DAILY FOR STOMACH ACID OR REFLUX   glucose blood (ACCU-CHEK AVIVA PLUS) test strip, CHECK BLOOD SUGAR 1 TIME DAILY. [id number]   hydroxychloroquine (PLAQUENIL) 200 MG tablet, TAKE 1 TABLET BY MOUTH TWICE DAILY( EVERY 12 HOURS) MONDAY TO FRIDAY   lamoTRIgine (LAMICTAL) 150 MG tablet, Take 1 & 1/2 tablets at bedtime (Patient taking differently: once. Take 1 tablet at bedtime)   Lancets (ACCU-CHEK MULTICLIX) lancets, Use to check blood glucose daily   Lysine 500 MG TABS, Take 1,000 mg by mouth daily. Currently taking 2 pill QD   Magnesium 500 MG TABS, Take 2,000 mg by mouth daily. Takes 4 tablets daily   Multiple Vitamin (MULTIVITAMIN WITH MINERALS) TABS tablet, Take 0.5 tablets by mouth 2 (two) times daily.   ondansetron (ZOFRAN) 4 MG tablet, Take 1 tablet (4 mg total) by mouth daily as needed for nausea  or vomiting.   QUEtiapine (SEROQUEL) 100 MG tablet, Take 100 mg by mouth 3 (three) times daily.   vitamin C (ASCORBIC ACID) 500 MG tablet, Take 500 mg by mouth daily.   zinc gluconate 50 MG tablet, Take 50 mg by mouth daily.  Past Medical History:  Diagnosis Date   Fibromyalgia    Hypertension    S/P total knee arthroplasty, left 01/07/2019   Simple renal cyst 12/12/2019   Incidentally noted on abd [place] 11/2019 -  radiologist recommended further characterization with MRI w & w/o contrast   Hypoechoic lesions within the kidney which may simply represent mildly complicated cysts. The largest of these lies in the midportion of the kidney with increased echogenicity within and suggestion of internal color flow which may be related to septation. MRI of the kidneys with and w   Status post total shoulder arthroplasty, left 01/07/2019   Vitamin D deficiency      Allergies  Allergen Reactions   Latex     Band aids= if left on for extended period of time   Celecoxib     REACTION: Rash (celebrex)   Cephalexin     REACTION: Rash (keflex)   Codeine     REACTION: Rash   Erythromycin     REACTION: Rash (emycin)   Sulfonamide Derivatives     REACTION: Rash   Trazodone And Nefazodone     insomnia   Venlafaxine     REACTION: Blurred Vision (effexor)    ROS: all negative except above.   Physical Exam: There were no vitals filed for this visit. There were no vitals taken for this visit. General Appearance: Well nourished, in no apparent distress. Eyes: PERRLA, EOMs, conjunctiva no swelling or erythema Sinuses: No Frontal/maxillary tenderness ENT/Mouth: Ext aud canals clear, TMs without erythema, bulging. No erythema, swelling, or exudate on post pharynx.  Tonsils not swollen or erythematous. Hearing normal.  Neck: Supple, thyroid normal.  Respiratory: Respiratory effort normal, BS equal bilaterally without rales, rhonchi, wheezing or stridor.  Cardio: RRR with no MRGs. Brisk peripheral pulses without edema.  Abdomen: Soft, + BS.  Non tender, no guarding, rebound, hernias, masses. Lymphatics: Non tender without lymphadenopathy.  Musculoskeletal: Full ROM, 5/5 strength, normal gait.  Skin: Warm, dry without rashes, lesions, ecchymosis.  Neuro: Cranial nerves intact. Normal muscle tone, no cerebellar symptoms. Sensation intact.  Psych: Awake and oriented X 3, normal affect, Insight and Judgment appropriate.      Vanessa Glimpse, NP 10:45 AM [facility]

## 2023-02-10 NOTE — Patient Instructions (Signed)
Your Home Program  General Guidelines for Pelvic Floor Exercise Challenge your muscles to do more than they are used to doing. The quality of the exercise is more important that the number you perform. Avoid straining, holding your breath or using buttock or leg muscles while you exercise the pelvic floor muscles.  Count out loud and continue breathing to avoid straining. Relax your body and breathe during your exercises.  Coordinate your breathing with your pelvic floor contraction by blowing out or exhaling while you contract your pelvic floor muscles.  Concentrate on activating both the sphincters and levator ani muscles of the pelvic floor with each exercise.  Count out loud while you are holding the contraction to make sure that you are breathing throughout the exercise and not straining.     2007, [employer] Doc.37

## 2023-02-11 ENCOUNTER — Other Ambulatory Visit: Payer: Self-pay | Admitting: Nurse Practitioner

## 2023-02-11 MED ORDER — DICYCLOMINE HCL 20 MG PO TABS: ORAL_TABLET | ORAL | 1 refills | Status: DC

## 2023-02-15 ENCOUNTER — Telehealth: Payer: Self-pay

## 2023-02-15 NOTE — Telephone Encounter (Signed)
Transition Care Management Follow-up Telephone Call Date of discharge and from where: 02/08/2023 [facility] How have you been since you were released from the hospital? Patient stated she is still in severe pain and the medication is not helping. Any questions or concerns? No  Items Reviewed: Did the pt receive and understand the discharge instructions provided? Yes  Medications obtained and verified? Yes  Other? No  Any new allergies since your discharge? No  Dietary orders reviewed? Yes Do you have support at home? Yes    Follow up appointments reviewed:  PCP Hospital f/u appt confirmed? Yes  Scheduled to see Lucky Cowboy, MD on 03/25/2023 @ [facility]. Specialist Hospital f/u appt confirmed?  Patient stated she is awaiting a call from PT.  Scheduled to see  on  @ . Are transportation arrangements needed? No  If their condition worsens, is the pt aware to call PCP or go to the Emergency Dept.? Yes Was the patient provided with contact information for the PCP's office or ED? Yes Was to pt encouraged to call back with questions or concerns? Yes  [name]  [facility] Population Health Community Resource Care Guide   ??[email or web address]  ?? [phone]   Website: [email or web address]  [email or web address]

## 2023-02-16 ENCOUNTER — Ambulatory Visit: Payer: Medicare PPO | Attending: Nurse Practitioner | Admitting: Physical Therapy

## 2023-02-16 ENCOUNTER — Encounter: Payer: Self-pay | Admitting: Physical Therapy

## 2023-02-16 ENCOUNTER — Telehealth: Payer: Self-pay

## 2023-02-16 ENCOUNTER — Other Ambulatory Visit: Payer: Self-pay

## 2023-02-16 DIAGNOSIS — R252 Cramp and spasm: Secondary | ICD-10-CM | POA: Diagnosis not present

## 2023-02-16 DIAGNOSIS — R102 Pelvic and perineal pain: Secondary | ICD-10-CM | POA: Insufficient documentation

## 2023-02-16 DIAGNOSIS — R262 Difficulty in walking, not elsewhere classified: Secondary | ICD-10-CM | POA: Insufficient documentation

## 2023-02-16 DIAGNOSIS — M6281 Muscle weakness (generalized): Secondary | ICD-10-CM | POA: Diagnosis not present

## 2023-02-16 DIAGNOSIS — M79605 Pain in left leg: Secondary | ICD-10-CM | POA: Diagnosis not present

## 2023-02-16 DIAGNOSIS — M79604 Pain in right leg: Secondary | ICD-10-CM | POA: Insufficient documentation

## 2023-02-16 NOTE — Telephone Encounter (Signed)
Patient inquiring about the status of the referral for PT. Would you please assist and then contact the patient. Thank You.

## 2023-02-16 NOTE — Telephone Encounter (Signed)
Patient scheduled for today

## 2023-02-16 NOTE — Therapy (Signed)
OUTPATIENT PHYSICAL THERAPY LOWER EXTREMITY EVALUATION   Patient Name: [name] MRN: [id number] DOB:1956/04/03, 67 y.o., female Today's Date: 02/16/2023  END OF SESSION:  PT End of Session - 02/16/23 2001     Visit Number 1    Date for PT Re-Evaluation 03/30/23    Authorization Type [employer]    Authorization Time Period 02/16/23 to 03/30/23    Progress Note Due on Visit 10    PT Start Time 1400    PT Stop Time 1445    PT Time Calculation (min) 45 min    Activity Tolerance Patient limited by pain    Behavior During Therapy [facility] for tasks assessed/performed             Past Medical History:  Diagnosis Date   Fibromyalgia    Hypertension    S/P total knee arthroplasty, left 01/07/2019   Simple renal cyst 12/12/2019   Incidentally noted on abd [place] 11/2019 - radiologist recommended further characterization with MRI w & w/o contrast   Hypoechoic lesions within the kidney which may simply represent mildly complicated cysts. The largest of these lies in the midportion of the kidney with increased echogenicity within and suggestion of internal color flow which may be related to septation. MRI of the kidneys with and w   Status post total shoulder arthroplasty, left 01/07/2019   Vitamin D deficiency    Past Surgical History:  Procedure Laterality Date   ABDOMINAL HYSTERECTOMY  2002   Dr. Chari Manning    COLONOSCOPY  2009   JOINT REPLACEMENT     arthroscopy   KNEE ARTHROPLASTY     TOTAL KNEE ARTHROPLASTY Right 04/14/2013   Procedure: RIGHT TOTAL KNEE ARTHROPLASTY AND LEFT KNEE INJECTION ;  Surgeon: Harvie Junior, MD;  Location: [facility] OR;  Service: Orthopedics;  Laterality: Right;   TOTAL KNEE ARTHROPLASTY Left 01/06/2019   Procedure: TOTAL KNEE ARTHROPLASTY;  Surgeon: Jodi Geralds, MD;  Location: [facility] ORS;  Service: Orthopedics;  Laterality: Left;   Patient Active Problem List   Diagnosis Date Noted   Osteopenia 09/03/2021   Seronegative rheumatoid arthritis (HCC) 03/12/2021    Hyperlipidemia 03/11/2021   Fatty liver 12/12/2019   Other abnormal glucose (prediabetes) 05/23/2019   Osteoarthritis of both knees 03/20/2015   OSA on CPAP 08/15/2014   Other chronic pain 01/31/2014   Depression, major, recurrent, in partial remission (HCC) 01/31/2014   Morbid obesity (HCC) - BMI 30+ with OSA 01/31/2014   Essential hypertension 08/09/2013   Vitamin D deficiency 08/09/2013    PCP: Lucky Cowboy, MD  REFERRING PROVIDER: Adela Glimpse, NP  REFERRING DIAG: pelvic pain  THERAPY DIAG:  Pain in right leg  Pain in left leg  Rationale for Evaluation and Treatment: Rehabilitation  ONSET DATE: End of July 2024  SUBJECTIVE:   SUBJECTIVE STATEMENT: Pt states that she began having bilateral groin pain at the end of July. Her pain would last an hour or so, but the pain would resolve on its own. This would happen on and off every couple of days/week. On 02/08/23 she went outside to pick up a pot, and her pain returned. Since then it has improved but is still constant. Notes pressure in her buttocks region with sitting but the pain is worse in her groin/pubic area when standing/walking. Pt notes that she was constipated and had to strain with BMs prior to all of this starting on Monday, but she denies issues with this currently.   PERTINENT HISTORY: Fibromyalgia PAIN:  Are you having  pain? Yes: NPRS scale: 3/10 Pain location: B groin region, buttocks  Pain description: initially sharp, but then it is constant and "hard" Aggravating factors: walking, standing  Relieving factors: pain meds, sitting is less intense than standing  PRECAUTIONS: None  RED FLAGS: None   WEIGHT BEARING RESTRICTIONS: No  FALLS:  Has patient fallen in last 6 months? No  LIVING ENVIRONMENT: Lives with: lives with their spouse Lives in: House/apartment Stairs: Yes: External: 2 steps; none Has following equipment at home: Environmental consultant - 2 wheeled  OCCUPATION: retired   PLOF:  Independent  PATIENT GOALS: be able to walk without pain   NEXT MD VISIT: as needed   OBJECTIVE:   DIAGNOSTIC FINDINGS:   PATIENT SURVEYS:     COGNITION: Overall cognitive status: Within functional limits for tasks assessed     SENSATION: Not tested Denies N/T EDEMA:   MUSCLE LENGTH: Hamstrings: Right WNL deg; Left WNL deg Thomas test: unable to test, but pt has pain with passive/active hip extension towards neutral  POSTURE:   PALPATION: Tenderness and muscle spasm of bilateral adductor groups; gluts bilaterally are tense and discomfort with palpation of external pelvic floor around ischial tuberosity.   LOWER EXTREMITY ROM:  Passive ROM Right eval Left eval  Hip flexion 110* 110*  Hip extension    Hip abduction    Hip adduction    Hip internal rotation WNL WNL  Hip external rotation WNL WNL  Knee flexion    Knee extension    Ankle dorsiflexion    Ankle plantarflexion    Ankle inversion    Ankle eversion     (Blank rows = not tested)  LOWER EXTREMITY MMT:  MMT Right eval Left eval  Hip flexion 3* 4  Hip extension    Hip abduction Unable due to pain with sidelying Unable due to pain with sidelying  Hip adduction    Hip internal rotation    Hip external rotation    Knee flexion    Knee extension 5 5  Ankle dorsiflexion    Ankle plantarflexion    Ankle inversion    Ankle eversion     (Blank rows = not tested)  LOWER EXTREMITY SPECIAL TESTS:  Unable to determine accuracy of FABER secondary to low threshold for pain  FUNCTIONAL TESTS:  5 times sit to stand: 20 sec, UE assist Timed up and go (TUG): 22 sec  GAIT: Distance walked:  Assistive device utilized:  Level of assistance:  Comments: antalgic Rt LE, minimal hip extension bilateral   TODAY'S TREATMENT:                                                                                                                              DATE:  Therex: Seated on pool noodle, rocking  back/forward. Deep/slow breathing to relax pelvic floor   Manual: STM/rolling stick bilateral adductors    PATIENT EDUCATION:  Education details: eval findings/POC; implemented deep breathing and seated glute/pelvic floor  release over pool noodle Person educated: Patient Education method: Explanation Education comprehension: verbalized understanding  HOME EXERCISE PROGRAM: No handout provided  ASSESSMENT:  CLINICAL IMPRESSION: Patient is a 67 y.o. F who was seen today for physical therapy evaluation and treatment for recent onset of bilateral groin/buttock pain. Her pain typically only lasts for about 1-2 hours, but this recent onset has been going on for about 7 days. Some improvement has been noted. Pt still has difficulty walking and completing all other ADLs, noting her pain can increase to 10/10 at its worse. It was difficult to fully assess pt's ROM and strength, she has notable glute weakness with MMT. Her ROM is WFL, but painful. Pt's functional testing of sit to stand and TUG are not at the level as expected for someone her age. Pt has palpable muscle spasm of the adductors and pain into the pubic bone, occasionally down the medial thigh. This could be a result of poor glute activation during activity causing over active adductors during ambulation and other activity. Unable to rule out pelvic floor component as well, and we discussed the benefits of scheduling her with our pelvic PT as well. Pt would benefit from skilled PT to address her hip strength impairments and decrease spasm and pain with basic ADLs.   OBJECTIVE IMPAIRMENTS: Abnormal gait, decreased activity tolerance, decreased balance, difficulty walking, decreased strength, increased muscle spasms, impaired flexibility, improper body mechanics, postural dysfunction, and pain.   ACTIVITY LIMITATIONS: bending, sitting, standing, bed mobility, dressing, locomotion level, and caring for others  PARTICIPATION LIMITATIONS: meal  prep, cleaning, community activity, yard work, and church  PERSONAL FACTORS: Age, Fitness, Past/current experiences, and Time since onset of injury/illness/exacerbation are also affecting patient's functional outcome.   REHAB POTENTIAL: Good  CLINICAL DECISION MAKING: Evolving/moderate complexity  EVALUATION COMPLEXITY: Moderate   GOALS: Goals reviewed with patient? Yes  SHORT TERM GOALS: Target date: 02/23/23 Pt will be independent with her initial HEP to improve flexibility and strength.  Baseline: Goal status: INITIAL   LONG TERM GOALS: Target date: 03/30/23  Pt will be able to complete bed mobility and log roll without increase in groin pain.  Baseline:  Goal status: INITIAL  2.  Pt will have atleast 4/5 MMT LE strength to improve her efficiency with ADLs/ Baseline:  Goal status: INITIAL  3.  Pt will report atleast 50% improvement in her pain from the start of PT. Baseline:  Goal status: INITIAL  4.  Pt will be able to ambulate without increase in Rt/Lt groin pain and with adequate hip extension.  Baseline:  Goal status: INITIAL  5.  Pt will be independent with an advanced HEP to maintain her progress in strength and flexibility after discharge from PT. Baseline:  Goal status: INITIAL  PLAN:  PT FREQUENCY: 2x/week  PT DURATION: 6 weeks  PLANNED INTERVENTIONS: Therapeutic exercises, Therapeutic activity, Neuromuscular re-education, Balance training, Gait training, Patient/Family education, Self Care, Joint mobilization, Aquatic Therapy, Dry Needling, Electrical stimulation, Manual therapy, and Re-evaluation  PLAN FOR NEXT SESSION: glute/adductor/pelvic floor stretches; glute strength progression; modalities/manual PRN for pain; discuss possible aquatic if pt doesn't respond in 2 more visits   8:24 PM,02/16/23 Donita Brooks PT, DPT [facility] [place]  [phone]

## 2023-02-17 DIAGNOSIS — L578 Other skin changes due to chronic exposure to nonionizing radiation: Secondary | ICD-10-CM | POA: Diagnosis not present

## 2023-02-17 DIAGNOSIS — D2239 Melanocytic nevi of other parts of face: Secondary | ICD-10-CM | POA: Diagnosis not present

## 2023-02-17 DIAGNOSIS — D2230 Melanocytic nevi of unspecified part of face: Secondary | ICD-10-CM | POA: Diagnosis not present

## 2023-02-17 DIAGNOSIS — D225 Melanocytic nevi of trunk: Secondary | ICD-10-CM | POA: Diagnosis not present

## 2023-02-17 DIAGNOSIS — D2272 Melanocytic nevi of left lower limb, including hip: Secondary | ICD-10-CM | POA: Diagnosis not present

## 2023-02-17 DIAGNOSIS — L821 Other seborrheic keratosis: Secondary | ICD-10-CM | POA: Diagnosis not present

## 2023-02-18 ENCOUNTER — Ambulatory Visit: Payer: Medicare PPO

## 2023-02-18 ENCOUNTER — Telehealth: Payer: Self-pay | Admitting: Nurse Practitioner

## 2023-02-18 ENCOUNTER — Other Ambulatory Visit: Payer: Self-pay | Admitting: Nurse Practitioner

## 2023-02-18 ENCOUNTER — Encounter: Payer: Self-pay | Admitting: Nurse Practitioner

## 2023-02-18 DIAGNOSIS — R102 Pelvic and perineal pain: Secondary | ICD-10-CM | POA: Diagnosis not present

## 2023-02-18 DIAGNOSIS — M79605 Pain in left leg: Secondary | ICD-10-CM | POA: Diagnosis not present

## 2023-02-18 DIAGNOSIS — R252 Cramp and spasm: Secondary | ICD-10-CM | POA: Diagnosis not present

## 2023-02-18 DIAGNOSIS — M79604 Pain in right leg: Secondary | ICD-10-CM | POA: Diagnosis not present

## 2023-02-18 DIAGNOSIS — R262 Difficulty in walking, not elsewhere classified: Secondary | ICD-10-CM

## 2023-02-18 DIAGNOSIS — M6281 Muscle weakness (generalized): Secondary | ICD-10-CM | POA: Diagnosis not present

## 2023-02-18 MED ORDER — PREDNISONE 10 MG PO TABS: ORAL_TABLET | ORAL | 0 refills | Status: DC

## 2023-02-18 NOTE — Therapy (Signed)
OUTPATIENT PHYSICAL THERAPY LOWER EXTREMITY EVALUATION   Patient Name: [name] MRN: [id number] DOB:[birth year], 67 y.o., female Today's Date: 02/18/2023  END OF SESSION:  PT End of Session - 02/18/23 1414     Visit Number 2    Date for PT Re-Evaluation 03/30/23    Authorization Type [employer]    Authorization Time Period 02/16/23 to 03/30/23    Progress Note Due on Visit 10    PT Start Time 1405    PT Stop Time 1455    PT Time Calculation (min) 50 min    Activity Tolerance Patient limited by pain    Behavior During Therapy [facility] for tasks assessed/performed             Past Medical History:  Diagnosis Date   Fibromyalgia    Hypertension    S/P total knee arthroplasty, left 01/07/2019   Simple renal cyst 12/12/2019   Incidentally noted on abd [place] 11/2019 - radiologist recommended further characterization with MRI w & w/o contrast   Hypoechoic lesions within the kidney which may simply represent mildly complicated cysts. The largest of these lies in the midportion of the kidney with increased echogenicity within and suggestion of internal color flow which may be related to septation. MRI of the kidneys with and w   Status post total shoulder arthroplasty, left 01/07/2019   Vitamin D deficiency    Past Surgical History:  Procedure Laterality Date   ABDOMINAL HYSTERECTOMY  2002   Dr. Chari Manning    COLONOSCOPY  2009   JOINT REPLACEMENT     arthroscopy   KNEE ARTHROPLASTY     TOTAL KNEE ARTHROPLASTY Right 04/14/2013   Procedure: RIGHT TOTAL KNEE ARTHROPLASTY AND LEFT KNEE INJECTION ;  Surgeon: Harvie Junior, MD;  Location: [facility] OR;  Service: Orthopedics;  Laterality: Right;   TOTAL KNEE ARTHROPLASTY Left 01/06/2019   Procedure: TOTAL KNEE ARTHROPLASTY;  Surgeon: Jodi Geralds, MD;  Location: [facility];  Service: Orthopedics;  Laterality: Left;   Patient Active Problem List   Diagnosis Date Noted   Osteopenia 09/03/2021   Seronegative rheumatoid arthritis (HCC) 03/12/2021    Hyperlipidemia 03/11/2021   Fatty liver 12/12/2019   Other abnormal glucose (prediabetes) 05/23/2019   Osteoarthritis of both knees 03/20/2015   OSA on CPAP 08/15/2014   Other chronic pain 01/31/2014   Depression, major, recurrent, in partial remission (HCC) 01/31/2014   Morbid obesity (HCC) - BMI 30+ with OSA 01/31/2014   Essential hypertension 08/09/2013   Vitamin D deficiency 08/09/2013    PCP: Lucky Cowboy, MD  REFERRING PROVIDER: Adela Glimpse, NP  REFERRING DIAG: pelvic pain  THERAPY DIAG:  Cramp and spasm  Difficulty in walking, not elsewhere classified  Muscle weakness (generalized)  Rationale for Evaluation and Treatment: Rehabilitation  ONSET DATE: End of July 2024  SUBJECTIVE:   SUBJECTIVE STATEMENT: Pt states that she is still having a lot of pain.  She locates her pain at the groin and pubic symphysis area.   PERTINENT HISTORY: Fibromyalgia PAIN:  Are you having pain? Yes: NPRS scale: 6/10 Pain location: B groin region, buttocks  Pain description: initially sharp, but then it is constant and "hard" Aggravating factors: walking, standing  Relieving factors: pain meds, sitting is less intense than standing  PRECAUTIONS: None  RED FLAGS: None   WEIGHT BEARING RESTRICTIONS: No  FALLS:  Has patient fallen in last 6 months? No  LIVING ENVIRONMENT: Lives with: lives with their spouse Lives in: House/apartment Stairs: Yes: External: 2 steps; none Has  following equipment at home: Dan Humphreys - 2 wheeled  OCCUPATION: retired   PLOF: Independent  PATIENT GOALS: be able to walk without pain   NEXT MD VISIT: as needed   OBJECTIVE:   DIAGNOSTIC FINDINGS:   PATIENT SURVEYS:     COGNITION: Overall cognitive status: Within functional limits for tasks assessed     SENSATION: Not tested Denies N/T EDEMA:   MUSCLE LENGTH: Hamstrings: Right WNL deg; Left WNL deg Maisie Fus test: unable to test, but pt has pain with passive/active hip extension  towards neutral  POSTURE:   PALPATION: Tenderness and muscle spasm of bilateral adductor groups; gluts bilaterally are tense and discomfort with palpation of external pelvic floor around ischial tuberosity.   LOWER EXTREMITY ROM:  Passive ROM Right eval Left eval  Hip flexion 110* 110*  Hip extension    Hip abduction    Hip adduction    Hip internal rotation WNL WNL  Hip external rotation WNL WNL  Knee flexion    Knee extension    Ankle dorsiflexion    Ankle plantarflexion    Ankle inversion    Ankle eversion     (Blank rows = not tested)  LOWER EXTREMITY MMT:  MMT Right eval Left eval  Hip flexion 3* 4  Hip extension    Hip abduction Unable due to pain with sidelying Unable due to pain with sidelying  Hip adduction    Hip internal rotation    Hip external rotation    Knee flexion    Knee extension 5 5  Ankle dorsiflexion    Ankle plantarflexion    Ankle inversion    Ankle eversion     (Blank rows = not tested)  LOWER EXTREMITY SPECIAL TESTS:  Unable to determine accuracy of FABER secondary to low threshold for pain  FUNCTIONAL TESTS:  5 times sit to stand: 20 sec, UE assist Timed up and go (TUG): 22 sec  GAIT: Distance walked:  Assistive device utilized:  Level of assistance:  Comments: antalgic Rt LE, minimal hip extension bilateral   TODAY'S TREATMENT:                                                                                                                              DATE: 02/18/23 Nustep x 5 min level 1 (PT present to discuss status) Standing hamstring stretch 3 x 30 sec both Standing hip flexor/quad stretch 3 x 30 sec  both Suggested childs pose stretch with hip abduction but patient could not get on her knees due to hx of bilateral TKA's.  Seated piriformis stretch 3 x 30 sec both Supine PPT x 20 focusing on TA activation Supine PPT with closed leg heel taps x 20  DATE:  Therex: Seated on pool noodle, rocking back/forward. Deep/slow  breathing to relax pelvic floor   Manual: STM/rolling stick bilateral adductors    PATIENT EDUCATION:  Education details: educated on inflammatory process and need to gain control of the  inflammation, suggested calling provider if pain is no better tomorrow by noon with change in how she is taking the Tylenol and Diclofenac.  Suggested she stagger her Tylenol with the Diclofenac according to dosage listed on the bottle.  If taking Tylenol every 4 hours then take the Diclofenac every 4 and stagger this.  That way, she is taking something every 2 hours.  If this regimen is not helping by noon tomorrow, call MD.  Person educated: Patient Education method: Explanation Education comprehension: verbalized understanding  HOME EXERCISE PROGRAM: No handout provided  ASSESSMENT:  CLINICAL IMPRESSION: Sugar continues to experience very sharp, intense pain.  She had sharp shooting pains after stretching when transferring from stand to supine.  This did not seem to improve with TA work.  Her pain, today, was very localized to the pubic symphysis area.  Diclofenac was prescribed but patient has not had any relief.  Due to the intensity of her pain today,  we suggested calling MD to see if a prednisone dose pack or stronger pain medication would be indicated.  Patient instructed to do TA contractions using pelvic tilt position and pelvic tilt with closed leg mini heel taps at home.   Pt would benefit from skilled PT to address her hip strength impairments and decrease spasm and pain with basic ADLs.   OBJECTIVE IMPAIRMENTS: Abnormal gait, decreased activity tolerance, decreased balance, difficulty walking, decreased strength, increased muscle spasms, impaired flexibility, improper body mechanics, postural dysfunction, and pain.   ACTIVITY LIMITATIONS: bending, sitting, standing, bed mobility, dressing, locomotion level, and caring for others  PARTICIPATION LIMITATIONS: meal prep, cleaning, community  activity, yard work, and church  PERSONAL FACTORS: Age, Fitness, Past/current experiences, and Time since onset of injury/illness/exacerbation are also affecting patient's functional outcome.   REHAB POTENTIAL: Good  CLINICAL DECISION MAKING: Evolving/moderate complexity  EVALUATION COMPLEXITY: Moderate   GOALS: Goals reviewed with patient? Yes  SHORT TERM GOALS: Target date: 02/23/23 Pt will be independent with her initial HEP to improve flexibility and strength.  Baseline: Goal status: INITIAL   LONG TERM GOALS: Target date: 03/30/23  Pt will be able to complete bed mobility and log roll without increase in groin pain.  Baseline:  Goal status: INITIAL  2.  Pt will have atleast 4/5 MMT LE strength to improve her efficiency with ADLs/ Baseline:  Goal status: INITIAL  3.  Pt will report atleast 50% improvement in her pain from the start of PT. Baseline:  Goal status: INITIAL  4.  Pt will be able to ambulate without increase in Rt/Lt groin pain and with adequate hip extension.  Baseline:  Goal status: INITIAL  5.  Pt will be independent with an advanced HEP to maintain her progress in strength and flexibility after discharge from PT. Baseline:  Goal status: INITIAL  PLAN:  PT FREQUENCY: 2x/week  PT DURATION: 6 weeks  PLANNED INTERVENTIONS: Therapeutic exercises, Therapeutic activity, Neuromuscular re-education, Balance training, Gait training, Patient/Family education, Self Care, Joint mobilization, Aquatic Therapy, Dry Needling, Electrical stimulation, Manual therapy, and Re-evaluation  PLAN FOR NEXT SESSION: Focus on closed leg TA work,  Patient to contact MD if pain is not better controlled, glute/adductor/pelvic floor stretches; glute strength progression; modalities/manual PRN for pain; discuss possible aquatic if pt doesn't respond in 2 more visits   Lynise Porr B. Stpehen Petitjean, PT 02/18/23 3:07 PM [facility] [address], [address] [place], [place] [address] Phone # [phone] Fax [phone]

## 2023-02-18 NOTE — Telephone Encounter (Signed)
Saw PT for groin pain and she said that she had lots of inflammation in that area. She suggested that she call our office to see about getting an rx for Prednisone to help with inflammation. Please advise patient. If something is called in, please send it to [facility] on [facility]

## 2023-02-23 ENCOUNTER — Ambulatory Visit: Payer: Medicare PPO | Attending: Nurse Practitioner

## 2023-02-23 DIAGNOSIS — M79604 Pain in right leg: Secondary | ICD-10-CM

## 2023-02-23 DIAGNOSIS — R262 Difficulty in walking, not elsewhere classified: Secondary | ICD-10-CM | POA: Diagnosis not present

## 2023-02-23 DIAGNOSIS — R293 Abnormal posture: Secondary | ICD-10-CM | POA: Diagnosis not present

## 2023-02-23 DIAGNOSIS — M79605 Pain in left leg: Secondary | ICD-10-CM

## 2023-02-23 DIAGNOSIS — R252 Cramp and spasm: Secondary | ICD-10-CM | POA: Diagnosis not present

## 2023-02-23 DIAGNOSIS — M6281 Muscle weakness (generalized): Secondary | ICD-10-CM

## 2023-02-23 NOTE — Therapy (Signed)
OUTPATIENT PHYSICAL THERAPY LOWER EXTREMITY EVALUATION   Patient Name: [name] MRN: [id number] DOB:16-Sep-1955, 67 y.o., female Today's Date: 02/23/2023  END OF SESSION:  PT End of Session - 02/23/23 1212     Visit Number 3    Number of Visits 8    Date for PT Re-Evaluation 03/30/23    Authorization Type [employer]    Authorization Time Period Cohere Approved 8 visits-02/16/2023-03/30/2023-auth#[id number]    Authorization - Visit Number 3    Authorization - Number of Visits 8    Progress Note Due on Visit 10    PT Start Time 1215    PT Stop Time 1250    PT Time Calculation (min) 35 min    Activity Tolerance Patient limited by pain    Behavior During Therapy [facility] for tasks assessed/performed             Past Medical History:  Diagnosis Date   Fibromyalgia    Hypertension    S/P total knee arthroplasty, left 01/07/2019   Simple renal cyst 12/12/2019   Incidentally noted on abd [place] 11/2019 - radiologist recommended further characterization with MRI w & w/o contrast   Hypoechoic lesions within the kidney which may simply represent mildly complicated cysts. The largest of these lies in the midportion of the kidney with increased echogenicity within and suggestion of internal color flow which may be related to septation. MRI of the kidneys with and w   Status post total shoulder arthroplasty, left 01/07/2019   Vitamin D deficiency    Past Surgical History:  Procedure Laterality Date   ABDOMINAL HYSTERECTOMY  2002   Dr. Chari Manning    COLONOSCOPY  2009   JOINT REPLACEMENT     arthroscopy   KNEE ARTHROPLASTY     TOTAL KNEE ARTHROPLASTY Right 04/14/2013   Procedure: RIGHT TOTAL KNEE ARTHROPLASTY AND LEFT KNEE INJECTION ;  Surgeon: Harvie Junior, MD;  Location: [facility];  Service: Orthopedics;  Laterality: Right;   TOTAL KNEE ARTHROPLASTY Left 01/06/2019   Procedure: TOTAL KNEE ARTHROPLASTY;  Surgeon: Jodi Geralds, MD;  Location: [facility];  Service: Orthopedics;  Laterality: Left;   Patient  Active Problem List   Diagnosis Date Noted   Osteopenia 09/03/2021   Seronegative rheumatoid arthritis (HCC) 03/12/2021   Hyperlipidemia 03/11/2021   Fatty liver 12/12/2019   Other abnormal glucose (prediabetes) 05/23/2019   Osteoarthritis of both knees 03/20/2015   OSA on CPAP 08/15/2014   Other chronic pain 01/31/2014   Depression, major, recurrent, in partial remission (HCC) 01/31/2014   Morbid obesity (HCC) - BMI 30+ with OSA 01/31/2014   Essential hypertension 08/09/2013   Vitamin D deficiency 08/09/2013    PCP: Lucky Cowboy, MD  REFERRING PROVIDER: Adela Glimpse, NP  REFERRING DIAG: pelvic pain  THERAPY DIAG:  Cramp and spasm  Difficulty in walking, not elsewhere classified  Muscle weakness (generalized)  Pain in right leg  Pain in left leg  Rationale for Evaluation and Treatment: Rehabilitation  ONSET DATE: End of July 2024  SUBJECTIVE:   SUBJECTIVE STATEMENT: Pt states that she called MD as soon as she left here to ask about the prednisone.  She states she did get the prednisone and started it Friday night.  She had significant relief all day Saturday and most of Sunday but then Sunday night at church, it got so bad that she had to have her sister bring her home.  Today, she is slightly better but the pain has returned to a similar level that it  was prior to the prednisone.  She    PERTINENT HISTORY: Fibromyalgia PAIN:  02/23/23 Are you having pain? Yes: NPRS scale: 8/10 Pain location: B groin region, buttocks  Pain description: initially sharp, but then it is constant and "hard" Aggravating factors: walking, standing  Relieving factors: pain meds, sitting is less intense than standing  PRECAUTIONS: None  RED FLAGS: None   WEIGHT BEARING RESTRICTIONS: No  FALLS:  Has patient fallen in last 6 months? No  LIVING ENVIRONMENT: Lives with: lives with their spouse Lives in: House/apartment Stairs: Yes: External: 2 steps; none Has following  equipment at home: Environmental consultant - 2 wheeled  OCCUPATION: retired   PLOF: Independent  PATIENT GOALS: be able to walk without pain   NEXT MD VISIT: as needed   OBJECTIVE:   DIAGNOSTIC FINDINGS:   PATIENT SURVEYS:     COGNITION: Overall cognitive status: Within functional limits for tasks assessed     SENSATION: Not tested Denies N/T EDEMA:   MUSCLE LENGTH: Hamstrings: Right WNL deg; Left WNL deg Thomas test: unable to test, but pt has pain with passive/active hip extension towards neutral  POSTURE:   PALPATION: Tenderness and muscle spasm of bilateral adductor groups; gluts bilaterally are tense and discomfort with palpation of external pelvic floor around ischial tuberosity.   LOWER EXTREMITY ROM:  Passive ROM Right eval Left eval  Hip flexion 110* 110*  Hip extension    Hip abduction    Hip adduction    Hip internal rotation WNL WNL  Hip external rotation WNL WNL  Knee flexion    Knee extension    Ankle dorsiflexion    Ankle plantarflexion    Ankle inversion    Ankle eversion     (Blank rows = not tested)  LOWER EXTREMITY MMT:  MMT Right eval Left eval  Hip flexion 3* 4  Hip extension    Hip abduction Unable due to pain with sidelying Unable due to pain with sidelying  Hip adduction    Hip internal rotation    Hip external rotation    Knee flexion    Knee extension 5 5  Ankle dorsiflexion    Ankle plantarflexion    Ankle inversion    Ankle eversion     (Blank rows = not tested)  LOWER EXTREMITY SPECIAL TESTS:  Unable to determine accuracy of FABER secondary to low threshold for pain  FUNCTIONAL TESTS:  5 times sit to stand: 20 sec, UE assist Timed up and go (TUG): 22 sec  GAIT: Distance walked:  Assistive device utilized:  Level of assistance:  Comments: antalgic Rt LE, minimal hip extension bilateral   TODAY'S TREATMENT:                                                                                                                               DATE: 02/23/23 Supine PPT x 20 focusing on TA activation Supine TA activation with closed leg heel taps x 20  Attempted supine TA activation with closed leg heel taps traveling side to side (patient was able to do approx 5 reps before stopping due to pain) Ice to pubic symphysis area seated x 10 min.   DATE: 02/18/23 Nustep x 5 min level 1 (PT present to discuss status) Standing hamstring stretch 3 x 30 sec both Standing hip flexor/quad stretch 3 x 30 sec  both Suggested childs pose stretch with hip abduction but patient could not get on her knees due to hx of bilateral TKA's.  Seated piriformis stretch 3 x 30 sec both Supine PPT x 20 focusing on TA activation Supine PPT with closed leg heel taps x 20  DATE:  Therex: Seated on pool noodle, rocking back/forward. Deep/slow breathing to relax pelvic floor   Manual: STM/rolling stick bilateral adductors    PATIENT EDUCATION:  Education details: educated on inflammatory process and need to gain control of the inflammation, suggested calling provider if pain is no better tomorrow by noon with change in how she is taking the Tylenol and Diclofenac.  Suggested she stagger her Tylenol with the Diclofenac according to dosage listed on the bottle.  If taking Tylenol every 4 hours then take the Diclofenac every 4 and stagger this.  That way, she is taking something every 2 hours.  If this regimen is not helping by noon tomorrow, call MD.  Person educated: Patient Education method: Explanation Education comprehension: verbalized understanding  HOME EXERCISE PROGRAM: No handout provided  ASSESSMENT:  CLINICAL IMPRESSION: Tailer had some relief with prednisone for first two days but pain returned Sunday evening.  She rose from chair in lobby with significantly less antalgic movement but once we got into the gym area, the antalgic gait and movement returned.  We kept all exercises very low level and with closed leg position.  Patient was in  tears trying to transfer sit to supine and remained tearful throughout treatment.  We opted to stop treatment and just try ice to pubic symphysis area x 10 min.  She reported some reduction in the pain while sitting on the ice but then stoop and resumed antalgic movement and gait.  Patient may have more of an issue that could be addressed more diligently by pelvic floor PT.  We were able to find an opening for her to see one of our pelvic floor PT's on Thursday.  We will proceed based on those findings.   Pt would benefit from continuing skilled PT to address her hip strength impairments and decrease spasm and pain with basic ADLs.   OBJECTIVE IMPAIRMENTS: Abnormal gait, decreased activity tolerance, decreased balance, difficulty walking, decreased strength, increased muscle spasms, impaired flexibility, improper body mechanics, postural dysfunction, and pain.   ACTIVITY LIMITATIONS: bending, sitting, standing, bed mobility, dressing, locomotion level, and caring for others  PARTICIPATION LIMITATIONS: meal prep, cleaning, community activity, yard work, and church  PERSONAL FACTORS: Age, Fitness, Past/current experiences, and Time since onset of injury/illness/exacerbation are also affecting patient's functional outcome.   REHAB POTENTIAL: Good  CLINICAL DECISION MAKING: Evolving/moderate complexity  EVALUATION COMPLEXITY: Moderate   GOALS: Goals reviewed with patient? Yes  SHORT TERM GOALS: Target date: 02/23/23 Pt will be independent with her initial HEP to improve flexibility and strength.  Baseline: Goal status: INITIAL   LONG TERM GOALS: Target date: 03/30/23  Pt will be able to complete bed mobility and log roll without increase in groin pain.  Baseline:  Goal status: INITIAL  2.  Pt will have atleast 4/5 MMT LE strength  to improve her efficiency with ADLs/ Baseline:  Goal status: INITIAL  3.  Pt will report atleast 50% improvement in her pain from the start of PT. Baseline:   Goal status: INITIAL  4.  Pt will be able to ambulate without increase in Rt/Lt groin pain and with adequate hip extension.  Baseline:  Goal status: INITIAL  5.  Pt will be independent with an advanced HEP to maintain her progress in strength and flexibility after discharge from PT. Baseline:  Goal status: INITIAL  PLAN:  PT FREQUENCY: 2x/week  PT DURATION: 6 weeks  PLANNED INTERVENTIONS: Therapeutic exercises, Therapeutic activity, Neuromuscular re-education, Balance training, Gait training, Patient/Family education, Self Care, Joint mobilization, Aquatic Therapy, Dry Needling, Electrical stimulation, Manual therapy, and Re-evaluation  PLAN FOR NEXT SESSION: Focus on closed leg TA work,  Patient to be evaluated by pelvic floor PT next visit.    Victorino Dike B. Shardea Cwynar, PT 02/23/23 1:04 PM  [facility] [address], [address] [place], [place] [address] Phone # [phone] Fax [phone]

## 2023-02-24 DIAGNOSIS — G4733 Obstructive sleep apnea (adult) (pediatric): Secondary | ICD-10-CM | POA: Diagnosis not present

## 2023-02-25 ENCOUNTER — Ambulatory Visit: Payer: Medicare PPO | Admitting: Physical Therapy

## 2023-02-25 DIAGNOSIS — M79605 Pain in left leg: Secondary | ICD-10-CM | POA: Diagnosis not present

## 2023-02-25 DIAGNOSIS — R252 Cramp and spasm: Secondary | ICD-10-CM

## 2023-02-25 DIAGNOSIS — M6281 Muscle weakness (generalized): Secondary | ICD-10-CM | POA: Diagnosis not present

## 2023-02-25 DIAGNOSIS — R262 Difficulty in walking, not elsewhere classified: Secondary | ICD-10-CM | POA: Diagnosis not present

## 2023-02-25 DIAGNOSIS — M79604 Pain in right leg: Secondary | ICD-10-CM | POA: Diagnosis not present

## 2023-02-25 DIAGNOSIS — R293 Abnormal posture: Secondary | ICD-10-CM | POA: Diagnosis not present

## 2023-02-25 NOTE — Therapy (Signed)
OUTPATIENT PHYSICAL THERAPY LOWER EXTREMITY EVALUATION   Patient Name: [name] MRN: [id number] DOB:[birth year], 67 y.o., female Today's Date: 02/25/2023  END OF SESSION:  PT End of Session - 02/25/23 0935     Visit Number 4    Number of Visits 8    Date for PT Re-Evaluation 03/30/23    Authorization Type [employer]    Authorization Time Period Cohere Approved 8 visits-02/16/2023-03/30/2023-auth#[id number]    Authorization - Visit Number 4    Authorization - Number of Visits 8    Progress Note Due on Visit 10    PT Start Time 0932    PT Stop Time 1010    PT Time Calculation (min) 38 min    Activity Tolerance Patient limited by pain    Behavior During Therapy [facility] for tasks assessed/performed              Past Medical History:  Diagnosis Date   Fibromyalgia    Hypertension    S/P total knee arthroplasty, left 01/07/2019   Simple renal cyst 12/12/2019   Incidentally noted on abd [place] 11/2019 - radiologist recommended further characterization with MRI w & w/o contrast   Hypoechoic lesions within the kidney which may simply represent mildly complicated cysts. The largest of these lies in the midportion of the kidney with increased echogenicity within and suggestion of internal color flow which may be related to septation. MRI of the kidneys with and w   Status post total shoulder arthroplasty, left 01/07/2019   Vitamin D deficiency    Past Surgical History:  Procedure Laterality Date   ABDOMINAL HYSTERECTOMY  2002   Dr. Chari Manning    COLONOSCOPY  2009   JOINT REPLACEMENT     arthroscopy   KNEE ARTHROPLASTY     TOTAL KNEE ARTHROPLASTY Right 04/14/2013   Procedure: RIGHT TOTAL KNEE ARTHROPLASTY AND LEFT KNEE INJECTION ;  Surgeon: Harvie Junior, MD;  Location: [facility] OR;  Service: Orthopedics;  Laterality: Right;   TOTAL KNEE ARTHROPLASTY Left 01/06/2019   Procedure: TOTAL KNEE ARTHROPLASTY;  Surgeon: Jodi Geralds, MD;  Location: [facility];  Service: Orthopedics;  Laterality: Left;    Patient Active Problem List   Diagnosis Date Noted   Osteopenia 09/03/2021   Seronegative rheumatoid arthritis (HCC) 03/12/2021   Hyperlipidemia 03/11/2021   Fatty liver 12/12/2019   Other abnormal glucose (prediabetes) 05/23/2019   Osteoarthritis of both knees 03/20/2015   OSA on CPAP 08/15/2014   Other chronic pain 01/31/2014   Depression, major, recurrent, in partial remission (HCC) 01/31/2014   Morbid obesity (HCC) - BMI 30+ with OSA 01/31/2014   Essential hypertension 08/09/2013   Vitamin D deficiency 08/09/2013    PCP: Lucky Cowboy, MD  REFERRING PROVIDER: Adela Glimpse, NP  REFERRING DIAG: pelvic pain  THERAPY DIAG:  Muscle weakness (generalized)  Cramp and spasm  Rationale for Evaluation and Treatment: Rehabilitation  ONSET DATE: End of July 2024  SUBJECTIVE:   SUBJECTIVE STATEMENT: Pt reports pain is better today, did rest a lot yesterday.   PERTINENT HISTORY: Fibromyalgia PAIN:  02/25/23  Are you having pain? Yes: NPRS scale: 1/10 Pain location: B groin region, pubic pain  Pain description: initially sharp, but then it is constant and "hard" Aggravating factors: walking, standing  Relieving factors: pain meds, sitting is less intense than standing  PRECAUTIONS: None  RED FLAGS: None   WEIGHT BEARING RESTRICTIONS: No  FALLS:  Has patient fallen in last 6 months? No  LIVING ENVIRONMENT: Lives with: lives with their spouse  Lives in: House/apartment Stairs: Yes: External: 2 steps; none Has following equipment at home: Walker - 2 wheeled  OCCUPATION: retired   PLOF: Independent  PATIENT GOALS: be able to walk without pain   NEXT MD VISIT: as needed   OBJECTIVE:   DIAGNOSTIC FINDINGS:   PATIENT SURVEYS:     COGNITION: Overall cognitive status: Within functional limits for tasks assessed     SENSATION: Not tested Denies N/T EDEMA:   MUSCLE LENGTH: Hamstrings: Right WNL deg; Left WNL deg Thomas test: unable to test, but  pt has pain with passive/active hip extension towards neutral  POSTURE:   PALPATION: Tenderness and muscle spasm of bilateral adductor groups; gluts bilaterally are tense and discomfort with palpation of external pelvic floor around ischial tuberosity.   LOWER EXTREMITY ROM:  Passive ROM Right eval Left eval  Hip flexion 110* 110*  Hip extension    Hip abduction    Hip adduction    Hip internal rotation WNL WNL  Hip external rotation WNL WNL  Knee flexion    Knee extension    Ankle dorsiflexion    Ankle plantarflexion    Ankle inversion    Ankle eversion     (Blank rows = not tested)  LOWER EXTREMITY MMT:  MMT Right eval Left eval  Hip flexion 3* 4  Hip extension    Hip abduction Unable due to pain with sidelying Unable due to pain with sidelying  Hip adduction    Hip internal rotation    Hip external rotation    Knee flexion    Knee extension 5 5  Ankle dorsiflexion    Ankle plantarflexion    Ankle inversion    Ankle eversion     (Blank rows = not tested)  LOWER EXTREMITY SPECIAL TESTS:  Unable to determine accuracy of FABER secondary to low threshold for pain  FUNCTIONAL TESTS:  5 times sit to stand: 20 sec, UE assist Timed up and go (TUG): 22 sec  GAIT: Distance walked:  Assistive device utilized:  Level of assistance:  Comments: antalgic Rt LE, minimal hip extension bilateral   TODAY'S TREATMENT:                                                                                                                              DATE:  02/25/23:  No emotional/communication barriers or cognitive limitation. Patient is motivated to learn. Patient understands and agrees with treatment goals and plan. PT explains patient will be examined in standing, sitting, and lying down to see how their muscles and joints work. When they are ready, they will be asked to remove their underwear so PT can examine their perineum. The patient is also given the option of providing  their own chaperone as one is not provided in our facility. The patient also has the right and is explained the right to defer or refuse any part of the evaluation or treatment including the internal exam. With the  patient's consent, PT will use one gloved finger to gently assess the muscles of the pelvic floor, seeing how well it contracts and relaxes and if there is muscle symmetry. After, the patient will get dressed and PT and patient will discuss exam findings and plan of care. PT and patient discuss plan of care, schedule, attendance policy and HEP activities.  X10 pelvic floor contractions  X10 quick flicks  X5 10s hold - pt able to hold contractions for 7-8s on average with all reps No pain with superficial or deep pelvic floor muscle palpation Does have 2-3/10 pain with external and internal palpation of pubic symphysis and does seem to have wider pubic symphysis however this may be pt's anatomy. Pt educated on SIJ belt, how to wear, TA activation and x10 completed with tactile cues for technique to decrease stress at pubic symphysis. Pt reported today this was ok but last week this did hurt at this region as well. Overall has been feeling better today and unsure if this has made replicating symptoms harder with assessment. PT educated pt to let doctor know if pain starts to return now that she has finished prednisone. Pt agreed.    02/23/23 Supine PPT x 20 focusing on TA activation Supine TA activation with closed leg heel taps x 20 Attempted supine TA activation with closed leg heel taps traveling side to side (patient was able to do approx 5 reps before stopping due to pain) Ice to pubic symphysis area seated x 10 min.   DATE: 02/18/23 Nustep x 5 min level 1 (PT present to discuss status) Standing hamstring stretch 3 x 30 sec both Standing hip flexor/quad stretch 3 x 30 sec  both Suggested childs pose stretch with hip abduction but patient could not get on her knees due to hx of bilateral  TKA's.  Seated piriformis stretch 3 x 30 sec both Supine PPT x 20 focusing on TA activation Supine PPT with closed leg heel taps x 20  DATE:  Therex: Seated on pool noodle, rocking back/forward. Deep/slow breathing to relax pelvic floor   Manual: STM/rolling stick bilateral adductors    PATIENT EDUCATION:  Education details: educated on inflammatory process and need to gain control of the inflammation, suggested calling provider if pain is no better tomorrow by noon with change in how she is taking the Tylenol and Diclofenac.  Suggested she stagger her Tylenol with the Diclofenac according to dosage listed on the bottle.  If taking Tylenol every 4 hours then take the Diclofenac every 4 and stagger this.  That way, she is taking something every 2 hours.  If this regimen is not helping by noon tomorrow, call MD.  Person educated: Patient Education method: Explanation Education comprehension: verbalized understanding  HOME EXERCISE PROGRAM: No handout provided  ASSESSMENT:  CLINICAL IMPRESSION: [place] presents reporting improvement in pain today compared to previously. States she is much better with 1/10 pain today. Consented to internal pelvic floor assessment, had 4/5 strength, 7-8s isometrics, and slightly decreased speed of quick flicks but no replicable pain. Does have TTP at pubic symphysis internally and externally. All single leg activity and uneven pelvis based activity like LB dressing, washing one leg at a time in shower, getting in/out of car or stairs causes worse pain. Pt does report history of low back pain, educated on SIJ belt and how to use this. Also if pain is continuing to be lower would benefit from stabilization exercises for SIJ and pubic. Pt agreed. PT also educated pt  on letting doctor know if pain does return now finishing medication. Pt agreed. Pt would benefit from continuing skilled PT to address her hip strength impairments and decrease spasm and pain with basic  ADLs.   OBJECTIVE IMPAIRMENTS: Abnormal gait, decreased activity tolerance, decreased balance, difficulty walking, decreased strength, increased muscle spasms, impaired flexibility, improper body mechanics, postural dysfunction, and pain.   ACTIVITY LIMITATIONS: bending, sitting, standing, bed mobility, dressing, locomotion level, and caring for others  PARTICIPATION LIMITATIONS: meal prep, cleaning, community activity, yard work, and church  PERSONAL FACTORS: Age, Fitness, Past/current experiences, and Time since onset of injury/illness/exacerbation are also affecting patient's functional outcome.   REHAB POTENTIAL: Good  CLINICAL DECISION MAKING: Evolving/moderate complexity  EVALUATION COMPLEXITY: Moderate   GOALS: Goals reviewed with patient? Yes  SHORT TERM GOALS: Target date: 02/23/23 Pt will be independent with her initial HEP to improve flexibility and strength.  Baseline: Goal status: INITIAL   LONG TERM GOALS: Target date: 03/30/23  Pt will be able to complete bed mobility and log roll without increase in groin pain.  Baseline:  Goal status: INITIAL  2.  Pt will have atleast 4/5 MMT LE strength to improve her efficiency with ADLs/ Baseline:  Goal status: INITIAL  3.  Pt will report atleast 50% improvement in her pain from the start of PT. Baseline:  Goal status: INITIAL  4.  Pt will be able to ambulate without increase in Rt/Lt groin pain and with adequate hip extension.  Baseline:  Goal status: INITIAL  5.  Pt will be independent with an advanced HEP to maintain her progress in strength and flexibility after discharge from PT. Baseline:  Goal status: INITIAL  PLAN:  PT FREQUENCY: 2x/week  PT DURATION: 6 weeks  PLANNED INTERVENTIONS: Therapeutic exercises, Therapeutic activity, Neuromuscular re-education, Balance training, Gait training, Patient/Family education, Self Care, Joint mobilization, Aquatic Therapy, Dry Needling, Electrical stimulation, Manual  therapy, and Re-evaluation  PLAN FOR NEXT SESSION: Focus on closed leg TA work,  Patient to be evaluated by pelvic floor PT next visit.    Otelia Sergeant, PT, DPT 02/24/2409:55 AM  [facility] [address], [address] [place], [place] [address] Phone # [phone] Fax [phone]

## 2023-03-04 ENCOUNTER — Ambulatory Visit: Payer: Medicare PPO

## 2023-03-04 DIAGNOSIS — R293 Abnormal posture: Secondary | ICD-10-CM | POA: Diagnosis not present

## 2023-03-04 DIAGNOSIS — M6281 Muscle weakness (generalized): Secondary | ICD-10-CM

## 2023-03-04 DIAGNOSIS — M79605 Pain in left leg: Secondary | ICD-10-CM | POA: Diagnosis not present

## 2023-03-04 DIAGNOSIS — M79604 Pain in right leg: Secondary | ICD-10-CM

## 2023-03-04 DIAGNOSIS — R262 Difficulty in walking, not elsewhere classified: Secondary | ICD-10-CM | POA: Diagnosis not present

## 2023-03-04 DIAGNOSIS — R252 Cramp and spasm: Secondary | ICD-10-CM | POA: Diagnosis not present

## 2023-03-04 NOTE — Therapy (Signed)
OUTPATIENT PHYSICAL THERAPY LOWER EXTREMITY EVALUATION   Patient Name: [name] MRN: [id number] DOB:11-05-1955, 67 y.o., female Today's Date: 03/04/2023  END OF SESSION:  PT End of Session - 03/04/23 1040     Visit Number 5    Number of Visits 8    Date for PT Re-Evaluation 03/30/23    Authorization Type [employer]    Authorization Time Period Cohere Approved 8 visits-02/16/2023-03/30/2023-auth#[id number]    Authorization - Visit Number 4    Authorization - Number of Visits 8    Progress Note Due on Visit 10    PT Start Time 1047    PT Stop Time 1116    PT Time Calculation (min) 29 min    Activity Tolerance Patient limited by pain    Behavior During Therapy [facility] for tasks assessed/performed              Past Medical History:  Diagnosis Date   Fibromyalgia    Hypertension    S/P total knee arthroplasty, left 01/07/2019   Simple renal cyst 12/12/2019   Incidentally noted on abd [place] 11/2019 - radiologist recommended further characterization with MRI w & w/o contrast   Hypoechoic lesions within the kidney which may simply represent mildly complicated cysts. The largest of these lies in the midportion of the kidney with increased echogenicity within and suggestion of internal color flow which may be related to septation. MRI of the kidneys with and w   Status post total shoulder arthroplasty, left 01/07/2019   Vitamin D deficiency    Past Surgical History:  Procedure Laterality Date   ABDOMINAL HYSTERECTOMY  2002   Dr. Chari Manning    COLONOSCOPY  2009   JOINT REPLACEMENT     arthroscopy   KNEE ARTHROPLASTY     TOTAL KNEE ARTHROPLASTY Right 04/14/2013   Procedure: RIGHT TOTAL KNEE ARTHROPLASTY AND LEFT KNEE INJECTION ;  Surgeon: Harvie Junior, MD;  Location: [facility] OR;  Service: Orthopedics;  Laterality: Right;   TOTAL KNEE ARTHROPLASTY Left 01/06/2019   Procedure: TOTAL KNEE ARTHROPLASTY;  Surgeon: Jodi Geralds, MD;  Location: [facility];  Service: Orthopedics;  Laterality: Left;    Patient Active Problem List   Diagnosis Date Noted   Osteopenia 09/03/2021   Seronegative rheumatoid arthritis (HCC) 03/12/2021   Hyperlipidemia 03/11/2021   Fatty liver 12/12/2019   Other abnormal glucose (prediabetes) 05/23/2019   Osteoarthritis of both knees 03/20/2015   OSA on CPAP 08/15/2014   Other chronic pain 01/31/2014   Depression, major, recurrent, in partial remission (HCC) 01/31/2014   Morbid obesity (HCC) - BMI 30+ with OSA 01/31/2014   Essential hypertension 08/09/2013   Vitamin D deficiency 08/09/2013    PCP: Lucky Cowboy, MD  REFERRING PROVIDER: Adela Glimpse, NP  REFERRING DIAG: pelvic pain  THERAPY DIAG:  Muscle weakness (generalized)  Cramp and spasm  Difficulty in walking, not elsewhere classified  Pain in right leg  Pain in left leg  Abnormal posture  Rationale for Evaluation and Treatment: Rehabilitation  ONSET DATE: End of July 2024  SUBJECTIVE:   SUBJECTIVE STATEMENT: Pt reports pain is so much better.  No flare ups since she was here last.  "I'm afraid to do much because I am so fearful of it coming back"  PERTINENT HISTORY: Fibromyalgia PAIN:  02/25/23  Are you having pain? Yes: NPRS scale: 1/10 Pain location: B groin region, pubic pain  Pain description: initially sharp, but then it is constant and "hard" Aggravating factors: walking, standing  Relieving factors: pain meds,  sitting is less intense than standing  PRECAUTIONS: None  RED FLAGS: None   WEIGHT BEARING RESTRICTIONS: No  FALLS:  Has patient fallen in last 6 months? No  LIVING ENVIRONMENT: Lives with: lives with their spouse Lives in: House/apartment Stairs: Yes: External: 2 steps; none Has following equipment at home: Environmental consultant - 2 wheeled  OCCUPATION: retired   PLOF: Independent  PATIENT GOALS: be able to walk without pain   NEXT MD VISIT: as needed   OBJECTIVE:   DIAGNOSTIC FINDINGS:   PATIENT SURVEYS:     COGNITION: Overall cognitive  status: Within functional limits for tasks assessed     SENSATION: Not tested Denies N/T EDEMA:   MUSCLE LENGTH: Hamstrings: Right WNL deg; Left WNL deg Thomas test: unable to test, but pt has pain with passive/active hip extension towards neutral  POSTURE:   PALPATION: Tenderness and muscle spasm of bilateral adductor groups; gluts bilaterally are tense and discomfort with palpation of external pelvic floor around ischial tuberosity.   LOWER EXTREMITY ROM:  Passive ROM Right eval Left eval  Hip flexion 110* 110*  Hip extension    Hip abduction    Hip adduction    Hip internal rotation WNL WNL  Hip external rotation WNL WNL  Knee flexion    Knee extension    Ankle dorsiflexion    Ankle plantarflexion    Ankle inversion    Ankle eversion     (Blank rows = not tested)  LOWER EXTREMITY MMT:  MMT Right eval Left eval  Hip flexion 3* 4  Hip extension    Hip abduction Unable due to pain with sidelying Unable due to pain with sidelying  Hip adduction    Hip internal rotation    Hip external rotation    Knee flexion    Knee extension 5 5  Ankle dorsiflexion    Ankle plantarflexion    Ankle inversion    Ankle eversion     (Blank rows = not tested)  LOWER EXTREMITY SPECIAL TESTS:  Unable to determine accuracy of FABER secondary to low threshold for pain  FUNCTIONAL TESTS:  5 times sit to stand: 20 sec, UE assist Timed up and go (TUG): 22 sec  GAIT: Distance walked:  Assistive device utilized:  Level of assistance:  Comments: antalgic Rt LE, minimal hip extension bilateral   TODAY'S TREATMENT:                                                                                                                              DATE:  02/23/23 Nustep x 5 min level 3 (PT present to discuss status and goals) Supine PPT x 20 focusing on TA activation Supine TA activation with closed leg heel taps 2 x 20 TA activation with isometric hip adduction with a 2-3 sec hold each  rep x 20 TA activation with heel slides 2 x 10 each LE with towel under heel (single heel) TA activation with bilateral heel  slides x 20 with towel under heels Trunk rotation x 20 TA activation with 6 lb shoulder extension/flexion overhead TA activation with tband shoulder pull down (PT holding middle of band - band with handles) Seated TA activation with mini sit up using 6 lb 2 x 10 Seated TA activation with mini [place] twist using 6 lb 2 x 10 Seated TA activation with shoulder to hip with 6 lb 2 x 10 Patient declined ice, PT reminded to use at home if any increase in pain.    02/25/23:  No emotional/communication barriers or cognitive limitation. Patient is motivated to learn. Patient understands and agrees with treatment goals and plan. PT explains patient will be examined in standing, sitting, and lying down to see how their muscles and joints work. When they are ready, they will be asked to remove their underwear so PT can examine their perineum. The patient is also given the option of providing their own chaperone as one is not provided in our facility. The patient also has the right and is explained the right to defer or refuse any part of the evaluation or treatment including the internal exam. With the patient's consent, PT will use one gloved finger to gently assess the muscles of the pelvic floor, seeing how well it contracts and relaxes and if there is muscle symmetry. After, the patient will get dressed and PT and patient will discuss exam findings and plan of care. PT and patient discuss plan of care, schedule, attendance policy and HEP activities.  X10 pelvic floor contractions  X10 quick flicks  X5 10s hold - pt able to hold contractions for 7-8s on average with all reps No pain with superficial or deep pelvic floor muscle palpation Does have 2-3/10 pain with external and internal palpation of pubic symphysis and does seem to have wider pubic symphysis however this may be pt's  anatomy. Pt educated on SIJ belt, how to wear, TA activation and x10 completed with tactile cues for technique to decrease stress at pubic symphysis. Pt reported today this was ok but last week this did hurt at this region as well. Overall has been feeling better today and unsure if this has made replicating symptoms harder with assessment. PT educated pt to let doctor know if pain starts to return now that she has finished prednisone. Pt agreed.    02/23/23 Supine PPT x 20 focusing on TA activation Supine TA activation with closed leg heel taps x 20 Attempted supine TA activation with closed leg heel taps traveling side to side (patient was able to do approx 5 reps before stopping due to pain) Ice to pubic symphysis area seated x 10 min.    DATE:  Therex: Seated on pool noodle, rocking back/forward. Deep/slow breathing to relax pelvic floor   Manual: STM/rolling stick bilateral adductors    PATIENT EDUCATION:  Education details: educated on inflammatory process and need to gain control of the inflammation, suggested calling provider if pain is no better tomorrow by noon with change in how she is taking the Tylenol and Diclofenac.  Suggested she stagger her Tylenol with the Diclofenac according to dosage listed on the bottle.  If taking Tylenol every 4 hours then take the Diclofenac every 4 and stagger this.  That way, she is taking something every 2 hours.  If this regimen is not helping by noon tomorrow, call MD.  Person educated: Patient Education method: Explanation Education comprehension: verbalized understanding  HOME EXERCISE PROGRAM: No handout provided  ASSESSMENT:  CLINICAL IMPRESSION: [name] continues to have minimal pain after completing prednisone dose pack.  She was able to do a lot more activity today with no increase in pain.  She admits she is a little sore at the pubic symphysis at end of session but no pain.  We did all activities with hips approximated and with isolated  TA activation.  She is well motivated and compliant.  We will add some of the exercises we did today to her HEP if no adverse effects.   Pt would benefit from continuing skilled PT to address her hip strength impairments and decrease spasm and pain with basic ADLs.   OBJECTIVE IMPAIRMENTS: Abnormal gait, decreased activity tolerance, decreased balance, difficulty walking, decreased strength, increased muscle spasms, impaired flexibility, improper body mechanics, postural dysfunction, and pain.   ACTIVITY LIMITATIONS: bending, sitting, standing, bed mobility, dressing, locomotion level, and caring for others  PARTICIPATION LIMITATIONS: meal prep, cleaning, community activity, yard work, and church  PERSONAL FACTORS: Age, Fitness, Past/current experiences, and Time since onset of injury/illness/exacerbation are also affecting patient's functional outcome.   REHAB POTENTIAL: Good  CLINICAL DECISION MAKING: Evolving/moderate complexity  EVALUATION COMPLEXITY: Moderate   GOALS: Goals reviewed with patient? Yes  SHORT TERM GOALS: Target date: 02/23/23 Pt will be independent with her initial HEP to improve flexibility and strength.  Baseline: Goal status: INITIAL   LONG TERM GOALS: Target date: 03/30/23  Pt will be able to complete bed mobility and log roll without increase in groin pain.  Baseline:  Goal status: INITIAL  2.  Pt will have atleast 4/5 MMT LE strength to improve her efficiency with ADLs/ Baseline:  Goal status: INITIAL  3.  Pt will report atleast 50% improvement in her pain from the start of PT. Baseline:  Goal status: MET  4.  Pt will be able to ambulate without increase in Rt/Lt groin pain and with adequate hip extension.  Baseline:  Goal status: INITIAL  5.  Pt will be independent with an advanced HEP to maintain her progress in strength and flexibility after discharge from PT. Baseline:  Goal status: INITIAL  PLAN:  PT FREQUENCY: 2x/week  PT DURATION: 6  weeks  PLANNED INTERVENTIONS: Therapeutic exercises, Therapeutic activity, Neuromuscular re-education, Balance training, Gait training, Patient/Family education, Self Care, Joint mobilization, Aquatic Therapy, Dry Needling, Electrical stimulation, Manual therapy, and Re-evaluation  PLAN FOR NEXT SESSION: Focus on closed leg TA work,  add several of today's activities to HEP if no adverse effects.    Victorino Dike B. Alexy Heldt, PT 03/04/23 11:30 AM [facility] [address] [place], [place] [address] Phone # [phone] Fax [phone]

## 2023-03-08 ENCOUNTER — Encounter: Payer: Self-pay | Admitting: Physical Therapy

## 2023-03-08 ENCOUNTER — Encounter: Payer: Self-pay | Admitting: Nurse Practitioner

## 2023-03-08 ENCOUNTER — Ambulatory Visit: Payer: Medicare PPO | Admitting: Physical Therapy

## 2023-03-08 ENCOUNTER — Other Ambulatory Visit: Payer: Self-pay | Admitting: Nurse Practitioner

## 2023-03-08 DIAGNOSIS — M6281 Muscle weakness (generalized): Secondary | ICD-10-CM

## 2023-03-08 DIAGNOSIS — R1032 Left lower quadrant pain: Secondary | ICD-10-CM

## 2023-03-08 DIAGNOSIS — R102 Pelvic and perineal pain: Secondary | ICD-10-CM

## 2023-03-08 DIAGNOSIS — R252 Cramp and spasm: Secondary | ICD-10-CM

## 2023-03-08 DIAGNOSIS — R262 Difficulty in walking, not elsewhere classified: Secondary | ICD-10-CM

## 2023-03-08 DIAGNOSIS — R1031 Right lower quadrant pain: Secondary | ICD-10-CM

## 2023-03-08 MED ORDER — PREDNISONE 10 MG PO TABS: ORAL_TABLET | ORAL | 0 refills | Status: DC

## 2023-03-08 NOTE — Therapy (Addendum)
OUTPATIENT PHYSICAL THERAPY LOWER EXTREMITY EVALUATION   Patient Name: [name] MRN: [id number] DOB:25-Jul-1955, 67 y.o., female Today's Date: 03/08/2023  END OF SESSION:  PT End of Session - 03/08/23 1058     Visit Number 6   Pt was seen but not treated due to pain, next visit will be visit count 6   Number of Visits 8    Date for PT Re-Evaluation 03/30/23    Authorization Type [employer]    Authorization Time Period Cohere Approved 8 visits-02/16/2023-03/30/2023-auth#[id number]    Authorization - Visit Number 5   Pt was seen but not treated due to pain levels, next visit will be 5/8   Authorization - Number of Visits 8    Progress Note Due on Visit 10    PT Start Time 1058    PT Stop Time 1130    PT Time Calculation (min) 32 min    Activity Tolerance Patient limited by pain    Behavior During Therapy [facility] for tasks assessed/performed               Past Medical History:  Diagnosis Date   Fibromyalgia    Hypertension    S/P total knee arthroplasty, left 01/07/2019   Simple renal cyst 12/12/2019   Incidentally noted on abd [place] 11/2019 - radiologist recommended further characterization with MRI w & w/o contrast   Hypoechoic lesions within the kidney which may simply represent mildly complicated cysts. The largest of these lies in the midportion of the kidney with increased echogenicity within and suggestion of internal color flow which may be related to septation. MRI of the kidneys with and w   Status post total shoulder arthroplasty, left 01/07/2019   Vitamin D deficiency    Past Surgical History:  Procedure Laterality Date   ABDOMINAL HYSTERECTOMY  2002   Dr. Chari Manning    COLONOSCOPY  2009   JOINT REPLACEMENT     arthroscopy   KNEE ARTHROPLASTY     TOTAL KNEE ARTHROPLASTY Right 04/14/2013   Procedure: RIGHT TOTAL KNEE ARTHROPLASTY AND LEFT KNEE INJECTION ;  Surgeon: Harvie Junior, MD;  Location: [facility] OR;  Service: Orthopedics;  Laterality: Right;   TOTAL KNEE ARTHROPLASTY  Left 01/06/2019   Procedure: TOTAL KNEE ARTHROPLASTY;  Surgeon: Jodi Geralds, MD;  Location: [facility];  Service: Orthopedics;  Laterality: Left;   Patient Active Problem List   Diagnosis Date Noted   Osteopenia 09/03/2021   Seronegative rheumatoid arthritis (HCC) 03/12/2021   Hyperlipidemia 03/11/2021   Fatty liver 12/12/2019   Other abnormal glucose (prediabetes) 05/23/2019   Osteoarthritis of both knees 03/20/2015   OSA on CPAP 08/15/2014   Other chronic pain 01/31/2014   Depression, major, recurrent, in partial remission (HCC) 01/31/2014   Morbid obesity (HCC) - BMI 30+ with OSA 01/31/2014   Essential hypertension 08/09/2013   Vitamin D deficiency 08/09/2013    PCP: Lucky Cowboy, MD  REFERRING PROVIDER: Adela Glimpse, NP  REFERRING DIAG: pelvic pain  THERAPY DIAG:  Muscle weakness (generalized)  Cramp and spasm  Difficulty in walking, not elsewhere classified  Rationale for Evaluation and Treatment: Rehabilitation  ONSET DATE: End of July 2024  SUBJECTIVE:   SUBJECTIVE STATEMENT: Something is wrong with me there is no way this is muscle spasm.  Pt is in tears and walking very tenderly with short stride and grimacing in pain.  I was on my feet on Sat for 1.5 hours but didn't do anything strenuous but the pain came back in full force.  I can't do anything to get relief.  My back is starting to hurt too but my "crotch" is the worse pain.  PERTINENT HISTORY: Fibromyalgia PAIN:  02/25/23  Are you having pain? Yes: NPRS scale: 10/10 Pain location: B groin region, pubic pain  Pain description: initially sharp, but then it is constant and "hard" Aggravating factors: walking, standing  Relieving factors: pain meds, sitting is less intense than standing  PRECAUTIONS: None  RED FLAGS: None   WEIGHT BEARING RESTRICTIONS: No  FALLS:  Has patient fallen in last 6 months? No  LIVING ENVIRONMENT: Lives with: lives with their spouse Lives in: House/apartment Stairs:  Yes: External: 2 steps; none Has following equipment at home: Environmental consultant - 2 wheeled  OCCUPATION: retired   PLOF: Independent  PATIENT GOALS: be able to walk without pain   NEXT MD VISIT: as needed   OBJECTIVE:   DIAGNOSTIC FINDINGS:   PATIENT SURVEYS:     COGNITION: Overall cognitive status: Within functional limits for tasks assessed     SENSATION: Not tested Denies N/T EDEMA:   MUSCLE LENGTH: Hamstrings: Right WNL deg; Left WNL deg Thomas test: unable to test, but pt has pain with passive/active hip extension towards neutral  POSTURE:   PALPATION: Tenderness and muscle spasm of bilateral adductor groups; gluts bilaterally are tense and discomfort with palpation of external pelvic floor around ischial tuberosity.   LOWER EXTREMITY ROM:  Passive ROM Right eval Left eval  Hip flexion 110* 110*  Hip extension    Hip abduction    Hip adduction    Hip internal rotation WNL WNL  Hip external rotation WNL WNL  Knee flexion    Knee extension    Ankle dorsiflexion    Ankle plantarflexion    Ankle inversion    Ankle eversion     (Blank rows = not tested)  LOWER EXTREMITY MMT:  MMT Right eval Left eval  Hip flexion 3* 4  Hip extension    Hip abduction Unable due to pain with sidelying Unable due to pain with sidelying  Hip adduction    Hip internal rotation    Hip external rotation    Knee flexion    Knee extension 5 5  Ankle dorsiflexion    Ankle plantarflexion    Ankle inversion    Ankle eversion     (Blank rows = not tested)  LOWER EXTREMITY SPECIAL TESTS:  Unable to determine accuracy of FABER secondary to low threshold for pain  FUNCTIONAL TESTS:  5 times sit to stand: 20 sec, UE assist Timed up and go (TUG): 22 sec  GAIT: Distance walked:  Assistive device utilized:  Level of assistance:  Comments: antalgic Rt LE, minimal hip extension bilateral   TODAY'S TREATMENT:                                                                                                                               DATE:  03/08/23: PT attempted  positional relief but Pt unable to perform any transfers or movement without tearful 10/10 pain.  Did teach Pt box breathing for anxiety over pain. Attempted tightening SI belt but Pt reported no further relief, in fact maybe more pain PT sent message to referring provider per request of Pt to ask provider to contact patient for further advice PT used clinic's walker to help patient get back to car  02/23/23 Nustep x 5 min level 3 (PT present to discuss status and goals) Supine PPT x 20 focusing on TA activation Supine TA activation with closed leg heel taps 2 x 20 TA activation with isometric hip adduction with a 2-3 sec hold each rep x 20 TA activation with heel slides 2 x 10 each LE with towel under heel (single heel) TA activation with bilateral heel slides x 20 with towel under heels Trunk rotation x 20 TA activation with 6 lb shoulder extension/flexion overhead TA activation with tband shoulder pull down (PT holding middle of band - band with handles) Seated TA activation with mini sit up using 6 lb 2 x 10 Seated TA activation with mini [place] twist using 6 lb 2 x 10 Seated TA activation with shoulder to hip with 6 lb 2 x 10 Patient declined ice, PT reminded to use at home if any increase in pain.    02/25/23:  No emotional/communication barriers or cognitive limitation. Patient is motivated to learn. Patient understands and agrees with treatment goals and plan. PT explains patient will be examined in standing, sitting, and lying down to see how their muscles and joints work. When they are ready, they will be asked to remove their underwear so PT can examine their perineum. The patient is also given the option of providing their own chaperone as one is not provided in our facility. The patient also has the right and is explained the right to defer or refuse any part of the evaluation or treatment including  the internal exam. With the patient's consent, PT will use one gloved finger to gently assess the muscles of the pelvic floor, seeing how well it contracts and relaxes and if there is muscle symmetry. After, the patient will get dressed and PT and patient will discuss exam findings and plan of care. PT and patient discuss plan of care, schedule, attendance policy and HEP activities.  X10 pelvic floor contractions  X10 quick flicks  X5 10s hold - pt able to hold contractions for 7-8s on average with all reps No pain with superficial or deep pelvic floor muscle palpation Does have 2-3/10 pain with external and internal palpation of pubic symphysis and does seem to have wider pubic symphysis however this may be pt's anatomy. Pt educated on SIJ belt, how to wear, TA activation and x10 completed with tactile cues for technique to decrease stress at pubic symphysis. Pt reported today this was ok but last week this did hurt at this region as well. Overall has been feeling better today and unsure if this has made replicating symptoms harder with assessment. PT educated pt to let doctor know if pain starts to return now that she has finished prednisone. Pt agreed.    02/23/23 Supine PPT x 20 focusing on TA activation Supine TA activation with closed leg heel taps x 20 Attempted supine TA activation with closed leg heel taps traveling side to side (patient was able to do approx 5 reps before stopping due to pain) Ice to pubic symphysis area seated x 10 min.  DATE:  Therex: Seated on pool noodle, rocking back/forward. Deep/slow breathing to relax pelvic floor   Manual: STM/rolling stick bilateral adductors    PATIENT EDUCATION:  Education details: educated on inflammatory process and need to gain control of the inflammation, suggested calling provider if pain is no better tomorrow by noon with change in how she is taking the Tylenol and Diclofenac.  Suggested she stagger her Tylenol with the Diclofenac  according to dosage listed on the bottle.  If taking Tylenol every 4 hours then take the Diclofenac every 4 and stagger this.  That way, she is taking something every 2 hours.  If this regimen is not helping by noon tomorrow, call MD.  Person educated: Patient Education method: Explanation Education comprehension: verbalized understanding  HOME EXERCISE PROGRAM: No handout provided  ASSESSMENT:  CLINICAL IMPRESSION: [name] arrived tearful, panicked and in 10/10 pain.  She had 2 days of relief with the oral steroids but came came back fully over the weekend after standing for 1.5 hours at church (nothing strenuous).  Pt was unable to tolerate tightening SI belt or any positional transitions to seek positional relief.  She states pain is constant.  PT advised use of walker to offload pelvis with gait and sent a message to referring provider requesting they contact patient for further advice.  Discussed with husband and patient to call us if they wanted to cancel next visit until more advice was given.   OBJECTIVE IMPAIRMENTS: Abnormal gait, decreased activity tolerance, decreased balance, difficulty walking, decreased strength, increased muscle spasms, impaired flexibility, improper body mechanics, postural dysfunction, and pain.   ACTIVITY LIMITATIONS: bending, sitting, standing, bed mobility, dressing, locomotion level, and caring for others  PARTICIPATION LIMITATIONS: meal prep, cleaning, community activity, yard work, and church  PERSONAL FACTORS: Age, Fitness, Past/current experiences, and Time since onset of injury/illness/exacerbation are also affecting patient's functional outcome.   REHAB POTENTIAL: Good  CLINICAL DECISION MAKING: Evolving/moderate complexity  EVALUATION COMPLEXITY: Moderate   GOALS: Goals reviewed with patient? Yes  SHORT TERM GOALS: Target date: 02/23/23 Pt will be independent with her initial HEP to improve flexibility and strength.  Baseline: Goal status:  INITIAL   LONG TERM GOALS: Target date: 03/30/23  Pt will be able to complete bed mobility and log roll without increase in groin pain.  Baseline:  Goal status: INITIAL  2.  Pt will have atleast 4/5 MMT LE strength to improve her efficiency with ADLs/ Baseline:  Goal status: INITIAL  3.  Pt will report atleast 50% improvement in her pain from the start of PT. Baseline:  Goal status: MET  4.  Pt will be able to ambulate without increase in Rt/Lt groin pain and with adequate hip extension.  Baseline:  Goal status: INITIAL  5.  Pt will be independent with an advanced HEP to maintain her progress in strength and flexibility after discharge from PT. Baseline:  Goal status: INITIAL  PLAN:  PT FREQUENCY: 2x/week  PT DURATION: 6 weeks  PLANNED INTERVENTIONS: Therapeutic exercises, Therapeutic activity, Neuromuscular re-education, Balance training, Gait training, Patient/Family education, Self Care, Joint mobilization, Aquatic Therapy, Dry Needling, Electrical stimulation, Manual therapy, and Re-evaluation  PLAN FOR NEXT SESSION: f/u on 10/10 pain and did Pt get in touch with referring provider, Focus on closed leg TA work,  add several of today's activities to HEP if no adverse effects.    Ashe Gago, PT 03/08/23 11:46 AM  PHYSICAL THERAPY DISCHARGE SUMMARY  Visits from Start of Care: 6  Current functional level  related to goals / functional outcomes: See above for most current PT status.  Pt didn't return to PT.     Remaining deficits: See above   Education / Equipment: HEP   Patient agrees to discharge. Patient goals were not met. Patient is being discharged due to not returning since the last visit.  Lorrene Reid, PT 07/22/23 4:48 PM   [facility] [address] [place], [place] [address] Phone # [phone] Fax [phone]

## 2023-03-10 ENCOUNTER — Ambulatory Visit: Payer: Medicare PPO

## 2023-03-12 ENCOUNTER — Encounter: Payer: Self-pay | Admitting: Nurse Practitioner

## 2023-03-12 ENCOUNTER — Ambulatory Visit: Payer: Medicare PPO | Admitting: Nurse Practitioner

## 2023-03-12 VITALS — BP 144/80 | HR 98 | Temp 97.9°F | Resp 16 | Ht 64.0 in | Wt 196.4 lb

## 2023-03-12 DIAGNOSIS — R102 Pelvic and perineal pain: Secondary | ICD-10-CM

## 2023-03-12 DIAGNOSIS — Z79899 Other long term (current) drug therapy: Secondary | ICD-10-CM | POA: Diagnosis not present

## 2023-03-12 DIAGNOSIS — R1031 Right lower quadrant pain: Secondary | ICD-10-CM | POA: Diagnosis not present

## 2023-03-12 DIAGNOSIS — R1032 Left lower quadrant pain: Secondary | ICD-10-CM | POA: Diagnosis not present

## 2023-03-12 NOTE — Progress Notes (Signed)
Follow Up  Assessment and Plan: Hospital visit follow up for:   Pelvic pain/Right and Left lower quad pain Further work up with an MRI - order placed. Review any musculoskeletal etiology most notably assessing for adductor tendonitis.  Continue Prednisone taper. Recommend 800 mg IBU Q8 hours PRN when sterod taper complete if pain persist. Recommend 1,000 mg Acetaminophen Q6 hours PRN for break through pain with steroids and/or IBU.  Medication management All medications discussed and reviewed in full. All questions and concerns regarding medications addressed.     Notify office for further evaluation and treatment, questions or concerns if any reported s/s fail to improve.   The patient was advised to call back or seek an in-person evaluation if any symptoms worsen or if the condition fails to improve as anticipated.   Further disposition pending results of labs. Discussed med's effects and SE's.    I discussed the assessment and treatment plan with the patient. The patient was provided an opportunity to ask questions and all were answered. The patient agreed with the plan and demonstrated an understanding of the instructions.  Discussed med's effects and SE's. Screening labs and tests as requested with regular follow-up as recommended.  I provided 25 minutes of face-to-face time during this encounter including counseling, chart review, and critical decision making was preformed.  Today's Plan of Care is based on a patient-centered health care approach known as shared decision making - the decisions, tests and treatments allow for patient preferences and values to be balanced with clinical evidence.     Future Appointments  Date Time Provider Department Center  03/25/2023 11:30 AM Lucky Cowboy, MD [facility] None  04/14/2023  9:30 AM Gearldine Bienenstock, PA-C [facility] None  06/30/2023 10:30 AM Adela Glimpse, NP [facility] None  09/30/2023 10:00 AM Lucky Cowboy, MD [facility] None   12/23/2023 11:30 AM Adela Glimpse, NP [facility] None  01/20/2024 10:00 AM Shawnie Dapper, NP [facility] None     HPI  Patient presented to ER on 02/08/23 with reports of new onset pelvic pain. She had a follow up visit in office on 02/10/23.  Hx of hysterectomy. Similar severe pain in the past but more pronounced than baseline. Pain was noted in bilateral lower pelvis to lower quadrants.   ED evaluation  on 02/08/23 noted via ultrasound that there was no visible bladder though she had significant bowl gas blocking use, otherwise normal. The most likely diagnosis was musculoskeletal etiology such as pelvic floor dysfunction.   She also completed a CT abdomen pelvis with contrast - impression was negative and showed no acute findings in the abdomen pelvis.   After receiving medications in the ER, Valium, she had complete resolution of her pain. Confirmed to be a musculoskeletal in  ideology and considered pelvic floor dysfunction where she has been participating in physical therapy. She was also referred  to gynecology but unsure where she is at this point (I do not see a future apt scheduled).  Requested further info from patient - pending.   During the f/u visit on 02/10/23 her pain seemed to be controlled at that time but then reoccurred.  She was treated with dicyclomine and a steroid taper which seem sto help significantly.   Shares today that pain continues to be aggravated by physical therapy (see attached mssg). She is also aggravated by sitting and standing for long periods of time.  Steroids are the only medications that are helping to relieve pain.  Images while in the hospital: US PELVIC  COMPLETE WITH TRANSVAGINAL  Result Date: 02/08/2023 CLINICAL DATA:  Unspecified abdominal pain EXAM: TRANSABDOMINAL AND TRANSVAGINAL ULTRASOUND OF PELVIS TECHNIQUE: Both transabdominal and transvaginal ultrasound examinations of the pelvis were performed. Transabdominal technique was performed for global  imaging of the pelvis including uterus, ovaries, adnexal regions, and pelvic cul-de-sac. It was necessary to proceed with endovaginal exam following the transabdominal exam to visualize the adnexa bilaterally. COMPARISON:  Concurrently performed CT examination of the abdomen and pelvis. FINDINGS: Uterus Absent Endometrium Not applicable Right ovary Not visualized.  No adnexal mass. Left ovary Not visualized.  No adnexal mass. Other findings No abnormal free fluid. IMPRESSION: 1. Status post hysterectomy. Nonvisualization of the ovaries. No adnexal mass. Electronically Signed   By: Helyn Numbers M.D.   On: 02/08/2023 20:30   CT ABDOMEN PELVIS W CONTRAST  Result Date: 02/08/2023 CLINICAL DATA:  Abdominal pain.  Lower pelvic pain for 3 hours. EXAM: CT ABDOMEN AND PELVIS WITH CONTRAST TECHNIQUE: Multidetector CT imaging of the abdomen and pelvis was performed using the standard protocol following bolus administration of intravenous contrast. RADIATION DOSE REDUCTION: This exam was performed according to the departmental dose-optimization program which includes automated exposure control, adjustment of the mA and/or kV according to patient size and/or use of iterative reconstruction technique. CONTRAST:  OMNIPAQUE IOHEXOL 300 MG/ML  SOLN COMPARISON:  None Available. FINDINGS: Lower chest: Lung bases are clear. Hepatobiliary: No focal hepatic lesion. Normal gallbladder. No biliary duct dilatation. Common bile duct is normal. Pancreas: Pancreas is normal. No ductal dilatation. No pancreatic inflammation. Spleen: Normal spleen Adrenals/urinary tract: Adrenal glands and kidneys are normal. The ureters and bladder normal. Stomach/Bowel: Stomach, small bowel, appendix, and cecum are normal. The colon and rectosigmoid colon are normal. Vascular/Lymphatic: Abdominal aorta is normal caliber. No periportal or retroperitoneal adenopathy. No pelvic adenopathy. Reproductive: Post hysterectomy.  Adnexa unremarkable Other:  No inguinal hernia.  No ventral hernia. Musculoskeletal: No aggressive osseous lesion. IMPRESSION: 1. No acute findings in the abdomen pelvis. 2. Normal appendix. 3. Post hysterectomy. Electronically Signed   By: Genevive Bi M.D.   On: 02/08/2023 17:29     Current Outpatient Medications (Endocrine & Metabolic):    predniSONE (DELTASONE) 10 MG tablet, 1 tab 3 x day for 2 days, then 1 tab 2 x day for 2 days, then 1 tab 1 x day for 3 days  Current Outpatient Medications (Cardiovascular):    hydrochlorothiazide (HYDRODIURIL) 25 MG tablet, TAKE 1 TABLET BY MOUTH DAILY FOR BLOOD PRESSURE AND FLUID RETENTION OR ANKLE SWELLING   rosuvastatin (CRESTOR) 40 MG tablet, TAKE 1 TABLET BY MOUTH DAILY FOR CHOLESTEROL (Patient taking differently: TAKE 1/2 TABLET BY MOUTH DAILY FOR CHOLESTEROL)  Current Outpatient Medications (Respiratory):    cetirizine (ZYRTEC) 10 MG tablet, Take 10 mg by mouth daily.   Current Outpatient Medications (Analgesics):    acetaminophen (TYLENOL) 500 MG tablet, Take 500 mg by mouth every 6 (six) hours as needed.   aspirin EC 81 MG tablet, Take 81 mg by mouth daily.   ibuprofen (ADVIL) 200 MG tablet, Take 200 mg by mouth every 6 (six) hours as needed.   Current Outpatient Medications (Other):    Cholecalciferol (VITAMIN D PO), Take 8,000 Int'l Units by mouth daily. 2000 units per capsule   CINNAMON PO, Take 2,000 mg by mouth daily.   clonazePAM (KLONOPIN) 1 MG tablet, Take 1 mg by mouth at bedtime as needed for anxiety.    diazepam (VALIUM) 2 MG tablet, Take 1 tablet (2 mg total) by mouth  every 6 (six) hours as needed (Muscle Spasm).   diclofenac Sodium (VOLTAREN) 1 % GEL, Apply topically daily.   esomeprazole (NEXIUM) 40 MG capsule, TAKE 1 CAPSULE BY MOUTH DAILY FOR STOMACH ACID OR REFLUX   glucose blood (ACCU-CHEK AVIVA PLUS) test strip, CHECK BLOOD SUGAR 1 TIME DAILY. [id number]   hydroxychloroquine (PLAQUENIL) 200 MG tablet, TAKE 1 TABLET BY MOUTH TWICE DAILY( EVERY 12  HOURS) MONDAY TO FRIDAY   Lancets (ACCU-CHEK MULTICLIX) lancets, Use to check blood glucose daily   Lysine 500 MG TABS, Take 1,000 mg by mouth daily. Currently taking 2 pill QD   Magnesium 500 MG TABS, Take 2,000 mg by mouth daily. Takes 4 tablets daily   Multiple Vitamin (MULTIVITAMIN WITH MINERALS) TABS tablet, Take 0.5 tablets by mouth 2 (two) times daily.   ondansetron (ZOFRAN) 4 MG tablet, Take 1 tablet (4 mg total) by mouth daily as needed for nausea or vomiting.   QUEtiapine (SEROQUEL) 100 MG tablet, Take 100 mg by mouth 3 (three) times daily.   vitamin C (ASCORBIC ACID) 500 MG tablet, Take 500 mg by mouth daily.   zinc gluconate 50 MG tablet, Take 50 mg by mouth daily.   acyclovir (ZOVIRAX) 400 MG tablet, Take 1 tablet (400 mg total) by mouth 3 (three) times daily as needed (fever blisters). (Patient not taking: Reported on 02/10/2023)   dicyclomine (BENTYL) 20 MG tablet, Take  1/2 to 1 tablet   4 x /day  before meals & Bedtime for Nausea, Cramping, Bloating or Diarrhea (Patient not taking: Reported on 03/12/2023)   lamoTRIgine (LAMICTAL) 150 MG tablet, Take 1 & 1/2 tablets at bedtime (Patient taking differently: once. Take 1 tablet at bedtime)  Past Medical History:  Diagnosis Date   Fibromyalgia    Hypertension    S/P total knee arthroplasty, left 01/07/2019   Simple renal cyst 12/12/2019   Incidentally noted on abd [place] 11/2019 - radiologist recommended further characterization with MRI w & w/o contrast   Hypoechoic lesions within the kidney which may simply represent mildly complicated cysts. The largest of these lies in the midportion of the kidney with increased echogenicity within and suggestion of internal color flow which may be related to septation. MRI of the kidneys with and w   Status post total shoulder arthroplasty, left 01/07/2019   Vitamin D deficiency      Allergies  Allergen Reactions   Latex     Band aids= if left on for extended period of time   Celecoxib      REACTION: Rash (celebrex)   Cephalexin     REACTION: Rash (keflex)   Codeine     REACTION: Rash   Erythromycin     REACTION: Rash (emycin)   Sulfonamide Derivatives     REACTION: Rash   Trazodone And Nefazodone     insomnia   Venlafaxine     REACTION: Blurred Vision (effexor)    ROS: all negative except above.   Physical Exam: Filed Weights   03/12/23 1051  Weight: 196 lb 6.4 oz (89.1 kg)   BP (!) 144/80   Pulse 98   Temp 97.9 F (36.6 C)   Resp 16   Ht 5\' 4"  (1.626 m)   Wt 196 lb 6.4 oz (89.1 kg)   SpO2 96%   BMI 33.71 kg/m  General Appearance: Well nourished, in no apparent distress. Eyes: PERRLA, EOMs, conjunctiva no swelling or erythema Sinuses: No Frontal/maxillary tenderness ENT/Mouth: Ext aud canals clear, TMs without erythema, bulging. No erythema,  swelling, or exudate on post pharynx.  Tonsils not swollen or erythematous. Hearing normal.  Neck: Supple, thyroid normal.  Respiratory: Respiratory effort normal, BS equal bilaterally without rales, rhonchi, wheezing or stridor.  Cardio: RRR with no MRGs. Brisk peripheral pulses without edema.  Abdomen: Soft, + BS.  Non tender, no guarding, rebound, hernias, masses. Lymphatics: Non tender without lymphadenopathy.  Musculoskeletal: Full ROM, 5/5 strength, normal gait.  Skin: Warm, dry without rashes, lesions, ecchymosis.  Neuro: Cranial nerves intact. Normal muscle tone, no cerebellar symptoms. Sensation intact.  Psych: Awake and oriented X 3, normal affect, Insight and Judgment appropriate.     Adela Glimpse, NP 11:56 AM [facility]

## 2023-03-12 NOTE — Patient Instructions (Signed)
Adductor Muscle Strain  An adductor muscle strain, also called a groin strain or pull, is an injury to the muscles or tendons on the upper, inner part of the thigh. These muscles are called the adductor or groin muscles. They are responsible for moving the legs across the body or pulling the legs together. A muscle strain occurs when a muscle is overstretched and some muscle fibers are torn. The severity of an adductor muscle strain is rated as Grade 1, 2, or 3. A Grade 3 strain has the most tearing and pain. What are the causes? Adductor muscle strains usually occur during exercise or while participating in sports. This condition may be caused by: A sudden, violent force placed on the muscle, stretching it too far. Stretching the muscles too far or too suddenly, often during side-to-side motion with a sudden change in direction. Putting repeated stress on the adductor muscles over a long period of time. Performing vigorous activity without properly stretching or warming up the adductor muscles beforehand. Not being properly conditioned. What are the signs or symptoms? Symptoms of this condition include: Pain and tenderness in the groin area. This begins as sharp pain and persists as a dull ache. A popping or snapping feeling when the injury occurs (for severe strains). Swelling or bruising. Muscle spasms. Weakness in the leg. Stiffness in the groin area with decreased ability to move the affected muscles. How is this diagnosed? This condition may be diagnosed based on: A physical exam. Your medical history. How well you can do certain range of motion exercises. Imaging tests, such as MRI, ultrasound, or X-rays. Your strain may be rated based on how severe it is. The ratings are: Grade 1 strain (mild). Muscles are overstretched. There may be very small muscle tears. This type of strain generally heals in about one week. Grade 2 strain (moderate). Muscles are partially torn. This may take  one to two months to heal. Grade 3 strain (severe). Muscles are completely torn. A severe strain can take more than three months to heal. Grade 3 gluteal strains are rare. How is this treated? An adductor strain will often heal on its own. If needed, this condition may be treated with: PRICE therapy. PRICE stands for protection of the injured area, rest, ice, pressure (compression), and elevation. Medicines to help manage pain and swelling (anti-inflammatory medicines). Crutches. You may be directed to use these for the first few days to minimize your pain. Depending on the severity of the muscle strain, recovery time may vary from a few weeks to several months. Severe injuries often require 4-6 weeks for recovery. In those cases, complete healing can take 4-5 months. Follow these instructions at home: PRICE Therapy  Protect the muscle from being injured again. Rest. Do not use the strained muscle if it causes pain. If directed, put ice on the injured area: Put ice in a plastic bag. Place a towel between your skin and the bag. Leave the ice on for 20 minutes, 2-3 times a day. Do this for the first 2 days after the injury. Apply compression by wrapping the injured area with an elastic bandage as told by your health care provider. Raise (elevate) the injured area above the level of your heart while you are sitting or lying down. Activity Do not drive or use heavy machinery while taking prescription pain medicine. Walk, stretch, and do exercises as told by your health care provider. Only do these activities if you can do so without any pain. General instructions  Take over-the-counter and prescription medicines only as told by your health care provider. Follow your treatment plan as told by your health care provider. This may include: Physical therapy. Massage. Local electrical stimulation (transcutaneous electrical nerve stimulation, TENS). Keep all follow-up visits. This is important. How  is this prevented? Warm up and stretch before being active. Cool down and stretch after being active. Give your body time to rest between periods of activity. Make sure to use equipment that fits you. Be safe and responsible while being active to avoid slips and falls. Maintain physical fitness, including: Proper conditioning in the adductor muscles. Overall strength, flexibility, and endurance. Contact a health care provider if: You have increased pain or swelling in the affected area. Your symptoms are not improving or they are getting worse. Summary An adductor muscle strain, also called a groin strain or pull, is an injury to the muscles or tendons on the upper, inner part of the thigh. A muscle strain occurs when a muscle is overstretched and some muscle fibers are torn. Depending on the severity of the muscle strain, recovery time may vary from a few weeks to several months. This information is not intended to replace advice given to you by your health care provider. Make sure you discuss any questions you have with your health care provider. Document Revised: 10/12/2022 Document Reviewed: 11/06/2020 [employer]vier Patient Education  2024 [employer].

## 2023-03-15 ENCOUNTER — Ambulatory Visit: Payer: Medicare PPO | Admitting: Physical Therapy

## 2023-03-17 ENCOUNTER — Encounter: Payer: Medicare PPO | Admitting: Physical Therapy

## 2023-03-22 ENCOUNTER — Encounter: Payer: Medicare PPO | Admitting: Physical Therapy

## 2023-03-22 NOTE — Progress Notes (Signed)
Future Appointments  Date Time Provider Department  03/25/2023                     6 mo   ov  11:30 AM Lucky Cowboy, MD [facility]  04/14/2023  9:30 AM Gearldine Bienenstock, PA-C [facility]  06/30/2023                       9 mo ov 10:30 AM Adela Glimpse, NP [facility]  09/30/2023                     cpe 10:00 AM Lucky Cowboy, MD [facility]  12/23/2023                      wellness 11:30 AM Adela Glimpse, NP [facility]  01/20/2024 10:00 AM Shawnie Dapper, NP [facility]    History of Present Illness:       This very nice 67 y.o.  MWF presents for 6 month follow up with HTN, HLD, Pre-Diabetes and Vitamin D Deficiency.  Patient has Rheumatoid Arthritis & is followed by Dr Corliss Skains.  Patient has been on CPAP for OSA since 2004 and reports improved sleep hygiene                  Patient has been on SS Disability since age 34 for hx/o Panic /Anxiety & Depression.         Patient is treated for HTN  (2004)   & BP has been controlled at home. Today's BP is at goal - 120/70. Patient has had no complaints of any cardiac type chest pain, palpitations, dyspnea Pollyann Kennedy /PND, dizziness, claudication or dependent edema.        Hyperlipidemia is controlled with diet & Rosuvastatin. Patient denies myalgias or other med SE's. Last Lipids were  Lab Results  Component Value Date   CHOL 121 12/23/2022   HDL 47 (L) 12/23/2022   LDLCALC 54 12/23/2022   TRIG 114 12/23/2022   CHOLHDL 2.6 12/23/2022      Also, the patient has history of  PreDiabetes  (A1c 5.7% / 2010) s and has had no symptoms of reactive hypoglycemia, diabetic polys, paresthesias or visual blurring.  Last A1c was   Lab Results  Component Value Date   HGBA1C 6.2 (H) 12/23/2022  Further, the patient also has  history of Vitamin D Deficiency  ("23" /2008)   and supplements vitamin D . Last vitamin D was at goal  :  Lab Results  Component Value Date   VD25OH 82 09/16/2022     Current Outpatient Medications  Medication Instructions   acetaminophen (TYLENOL)  500 mg Every 6 hours PRN   ascorbic acid (VITAMIN C)  500 mg Daily   aspirin EC81 mg Daily   cetirizine (ZYRTEC)10 mg Daily   VITAMIN D 8,000 Units Daily, 2000 units per capsule   CINNAMON  2,000 mg  Daily   clonazePAM (KLONOPIN)  1 mg  At bedtime    diazepam (VALIUM)  2 mg Every 6 hours PRN   diclofenac 1 % GEL Topical, Daily   esomeprazole  40 MG capsule TAKE 1 CAPSULE  DAILY   hydrochlorothiazide  25 MG tablet TAKE 1 TABLET DAILY \   hydroxychloroquine (PLAQUENIL) 200 MG  TAKE 1 TABLET   TWICE DAILY MONDAY TO FRIDAY   ibuprofen (ADVIL) 200 mg, Oral, Every 6 hours PRN   lamoTRIgine (LAMICTAL) 150 MG tablet Take 1 & 1/2 tablets at bedtime   Lysine  1,000 mg Currently taking 2 pill QD   Magnesium 2,000 mg, Oral, Daily, Takes 4 tablets daily   Multiple Vitamin (MULTIVITAMIN WITH MINERALS) TABS tablet 0.5 tablets, 2 times daily   ondansetron (ZOFRAN) 4 mg, Oral, Daily PRN   predniSONE (DELTASONE) 10 MG tablet 1 tab 3 x day for 2 days, then 1 tab 2 x day for 2 days, then 1 tab 1 x day for 3 days   QUEtiapine (SEROQUEL) 100 mg, 3 times daily   rosuvastatin (CRESTOR) 40 MG tablet TAKE 1 TABLET BY MOUTH DAILY FOR CHOLESTEROL   zinc gluconate 50 mg, Oral, Daily       Allergies  Allergen Reactions   Latex     Band aids= if left on for extended period of time   Celecoxib     REACTION: Rash (celebrex)   Cephalexin     REACTION: Rash (keflex)   Codeine     REACTION: Rash   Erythromycin      REACTION: Rash (emycin)   Sulfonamide Derivatives     REACTION: Rash   Trazodone And Nefazodone     insomnia   Venlafaxine     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 The largest of these lies in the midportion of the kidney with increased echogenicity within and suggestion of internal color flow which may be related to septation. MRI of the kidneys with and w   Status post total shoulder arthroplasty, left 01/07/2019   Vitamin D deficiency       Immunization History  Administered Date(s) Administered   DT (Pediatric) 07/05/2015   Influenza Inj Mdck Quad With Preservative 05/28/2017, 04/13/2018   Influenza Split 04/06/2012, 04/05/2013, 05/16/2014, 03/21/2015   Influenza,inj,Quad PF,6+ Mos 03/16/2019   Influenza,inj,quad, With Preservative 04/29/2016   PFIZER(Purple Top)SARS-COV-2 Vaccination 11/08/2019, 11/29/2019, 08/13/2020   Pneumococcal Conjugate-13 05/16/2014   Pneumococcal-Unspecified 06/22/1998   Td 06/22/2004   Zoster, Live 10/21/2015  Past Surgical History:  Procedure Laterality Date   ABDOMINAL HYSTERECTOMY  2002   Dr. Chari Manning    COLONOSCOPY  2009   JOINT REPLACEMENT     arthroscopy   KNEE ARTHROPLASTY     TOTAL KNEE ARTHROPLASTY Right 04/14/2013   Procedure: RIGHT TOTAL KNEE ARTHROPLASTY AND LEFT KNEE INJECTION ;  Surgeon: Harvie Junior, MD;  Location: [facility];  Service: Orthopedics;  Laterality: Right;   TOTAL KNEE ARTHROPLASTY Left 01/06/2019   Procedure: TOTAL KNEE ARTHROPLASTY;  Surgeon: Jodi Geralds, MD;  Location: [facility];  Service: Orthopedics;  Laterality: Left;     FHx:    Reviewed /  unchanged   SHx:    Reviewed / unchanged    Systems Review:  Constitutional: Denies fever, chills, wt changes, headaches, insomnia, fatigue, night sweats, change in appetite. Eyes: Denies redness, blurred vision, diplopia, discharge, itchy, watery eyes.  ENT: Denies discharge, congestion, post nasal drip, epistaxis, sore throat, earache, hearing loss, dental pain, tinnitus, vertigo, sinus pain, snoring.  CV: Denies chest pain, palpitations, irregular heartbeat, syncope, dyspnea, diaphoresis, orthopnea, PND, claudication or edema. Respiratory: denies cough, dyspnea, DOE, pleurisy, hoarseness, laryngitis, wheezing.  Gastrointestinal: Denies dysphagia, odynophagia, heartburn, reflux, water brash, abdominal pain or cramps, nausea, vomiting, bloating, diarrhea, constipation, hematemesis, melena, hematochezia  or hemorrhoids. Genitourinary: Denies dysuria, frequency, urgency, nocturia, hesitancy, discharge, hematuria or flank pain. Musculoskeletal: Denies arthralgias, myalgias, stiffness, jt. swelling, pain, limping or strain/sprain.  Skin: Denies pruritus, rash, hives, warts, acne, eczema or change in skin lesion(s). Neuro: No weakness, tremor, incoordination, spasms, paresthesia or pain. Psychiatric: Denies confusion, memory loss or sensory loss. Endo: Denies change in weight, skin or hair change.  Heme/Lymph: No excessive bleeding, bruising or enlarged lymph nodes.   Physical Exam  BP 120/70   Pulse 84   Temp 97.9 F (36.6 C)   Resp 16   Ht 5\' 4"  (1.626 m)   Wt 193 lb 12.8 oz (87.9 kg)   SpO2 96%   BMI 33.27 kg/m   Appears  well nourished, well groomed  and in no distress.  Eyes: PERRLA, EOMs, conjunctiva no swelling or erythema. Sinuses: No frontal/maxillary tenderness ENT/Mouth: EAC's clear, TM's nl w/o erythema, bulging. Nares clear w/o erythema, swelling, exudates. Oropharynx clear without erythema or exudates. Oral hygiene is good. Tongue normal, non obstructing. Hearing  intact.  Neck: Supple. Thyroid not palpable. Car 2+/2+ without bruits, nodes or JVD. Chest: Respirations nl with BS clear & equal w/o rales, rhonchi, wheezing or stridor.  Cor: Heart sounds normal w/ regular rate and rhythm without sig. murmurs, gallops, clicks or rubs. Peripheral pulses normal and equal  without edema.  Abdomen: Soft & bowel sounds normal. Non-tender w/o guarding, rebound, hernias, masses or organomegaly.  Lymphatics: Unremarkable.  Musculoskeletal: Full ROM all peripheral extremities, joint stability, 5/5 strength and normal gait.  Skin: Warm, dry without exposed rashes, lesions or ecchymosis apparent.  Neuro: Cranial nerves intact, reflexes equal bilaterally. Sensory-motor testing grossly intact. Tendon reflexes grossly intact.  Pysch: Alert & oriented x 3.  Insight and judgement nl & appropriate. No ideations.   Assessment and Plan:   1. Essential hypertension  - Continue medication, monitor blood pressure at home.  - Continue DASH diet.  Reminder to go to the ER if any CP,  SOB, nausea, dizziness, severe HA, changes vision/speech.   - CBC with Differential/Platelet - COMPLETE METABOLIC PANEL WITH GFR - Magnesium - TSH   2. Hyperlipemia, mixed  - Continue diet/meds, exercise,& lifestyle modifications.  - Continue monitor  periodic cholesterol/liver & renal functions    - Lipid panel - TSH   3. Abnormal glucose  - Continue diet, exercise  - Lifestyle modifications.  - Monitor appropriate labs.   - Hemoglobin A1c - Insulin, random   4. Vitamin D deficiency  - Continue supplementation.    - VITAMIN D 25 Hydroxy    5. Medication management  - CBC with Differential/Platelet - COMPLETE METABOLIC PANEL WITH GFR - Magnesium - Lipid panel - TSH - Hemoglobin A1c - Insulin, random - VITAMIN D 25 Hydroxy            Discussed  regular exercise, BP monitoring, weight control to achieve/maintain BMI less than 25 and discussed med and SE's.  Recommended labs to assess /monitor clinical status .  I discussed the assessment and treatment plan with the patient. The patient was provided an opportunity to ask questions and all were answered. The patient agreed with the plan and demonstrated an understanding of the instructions.  I provided over 30 minutes of exam, counseling, chart review and  complex critical decision making.        The patient was advised to call back or seek an in-person evaluation if the symptoms worsen or if the condition fails to improve as anticipated.   Marinus Maw, MD

## 2023-03-25 ENCOUNTER — Ambulatory Visit (INDEPENDENT_AMBULATORY_CARE_PROVIDER_SITE_OTHER): Payer: Medicare PPO | Admitting: Internal Medicine

## 2023-03-25 ENCOUNTER — Encounter: Payer: Self-pay | Admitting: Internal Medicine

## 2023-03-25 VITALS — BP 120/70 | HR 84 | Temp 97.9°F | Resp 16 | Ht 64.0 in | Wt 193.8 lb

## 2023-03-25 DIAGNOSIS — I10 Essential (primary) hypertension: Secondary | ICD-10-CM | POA: Diagnosis not present

## 2023-03-25 DIAGNOSIS — R7309 Other abnormal glucose: Secondary | ICD-10-CM | POA: Diagnosis not present

## 2023-03-25 DIAGNOSIS — E782 Mixed hyperlipidemia: Secondary | ICD-10-CM | POA: Diagnosis not present

## 2023-03-25 DIAGNOSIS — E559 Vitamin D deficiency, unspecified: Secondary | ICD-10-CM | POA: Diagnosis not present

## 2023-03-25 DIAGNOSIS — M76899 Other specified enthesopathies of unspecified lower limb, excluding foot: Secondary | ICD-10-CM

## 2023-03-25 DIAGNOSIS — Z79899 Other long term (current) drug therapy: Secondary | ICD-10-CM | POA: Diagnosis not present

## 2023-03-25 MED ORDER — MELOXICAM 15 MG PO TABS: ORAL_TABLET | ORAL | 3 refills | Status: DC

## 2023-03-25 MED ORDER — DEXAMETHASONE 4 MG PO TABS: ORAL_TABLET | ORAL | 0 refills | Status: DC

## 2023-03-25 NOTE — Patient Instructions (Signed)

## 2023-03-26 DIAGNOSIS — G4733 Obstructive sleep apnea (adult) (pediatric): Secondary | ICD-10-CM | POA: Diagnosis not present

## 2023-03-26 LAB — CBC WITH DIFFERENTIAL/PLATELET
Absolute Monocytes: 745 {cells}/uL (ref 200–950)
Basophils Absolute: 39 {cells}/uL (ref 0–200)
Basophils Relative: 0.7 %
Eosinophils Absolute: 151 {cells}/uL (ref 15–500)
Eosinophils Relative: 2.7 %
HCT: 40.9 % (ref 35.0–45.0)
Hemoglobin: 13.3 g/dL (ref 11.7–15.5)
Lymphs Abs: 1574 {cells}/uL (ref 850–3900)
MCH: 29.8 pg (ref 27.0–33.0)
MCHC: 32.5 g/dL (ref 32.0–36.0)
MCV: 91.5 fL (ref 80.0–100.0)
MPV: 10.9 fL (ref 7.5–12.5)
Monocytes Relative: 13.3 %
Neutro Abs: 3091 {cells}/uL (ref 1500–7800)
Neutrophils Relative %: 55.2 %
Platelets: 308 10*3/uL (ref 140–400)
RBC: 4.47 10*6/uL (ref 3.80–5.10)
RDW: 13.4 % (ref 11.0–15.0)
Total Lymphocyte: 28.1 %
WBC: 5.6 10*3/uL (ref 3.8–10.8)

## 2023-03-26 LAB — COMPLETE METABOLIC PANEL WITH GFR
AG Ratio: 1.5 (calc) (ref 1.0–2.5)
ALT: 32 U/L — ABNORMAL HIGH (ref 6–29)
AST: 21 U/L (ref 10–35)
Albumin: 4.2 g/dL (ref 3.6–5.1)
Alkaline phosphatase (APISO): 82 U/L (ref 37–153)
BUN: 12 mg/dL (ref 7–25)
CO2: 30 mmol/L (ref 20–32)
Calcium: 9.7 mg/dL (ref 8.6–10.4)
Chloride: 102 mmol/L (ref 98–110)
Creat: 0.65 mg/dL (ref 0.50–1.05)
Globulin: 2.8 g/dL (ref 1.9–3.7)
Glucose, Bld: 108 mg/dL — ABNORMAL HIGH (ref 65–99)
Potassium: 4.2 mmol/L (ref 3.5–5.3)
Sodium: 141 mmol/L (ref 135–146)
Total Bilirubin: 0.6 mg/dL (ref 0.2–1.2)
Total Protein: 7 g/dL (ref 6.1–8.1)
eGFR: 97 mL/min/{1.73_m2} (ref >=60)

## 2023-03-26 LAB — VITAMIN D 25 HYDROXY (VIT D DEFICIENCY, FRACTURES): Vit D, 25-Hydroxy: 74 ng/mL (ref 30–100)

## 2023-03-26 LAB — LIPID PANEL
Cholesterol: 110 mg/dL (ref <200)
HDL: 42 mg/dL — ABNORMAL LOW (ref >=50)
LDL Cholesterol (Calc): 48 mg/dL
Non-HDL Cholesterol (Calc): 68 mg/dL (ref <130)
Total CHOL/HDL Ratio: 2.6 (calc) (ref <5.0)
Triglycerides: 110 mg/dL (ref <150)

## 2023-03-26 LAB — MAGNESIUM: Magnesium: 1.7 mg/dL (ref 1.5–2.5)

## 2023-03-26 LAB — HEMOGLOBIN A1C
Hgb A1c MFr Bld: 6.3 %{Hb} — ABNORMAL HIGH (ref <5.7)
Mean Plasma Glucose: 134 mg/dL
eAG (mmol/L): 7.4 mmol/L

## 2023-03-26 LAB — TSH: TSH: 1.35 m[IU]/L (ref 0.40–4.50)

## 2023-03-26 LAB — INSULIN, RANDOM: Insulin: 43.1 u[IU]/mL — ABNORMAL HIGH

## 2023-03-27 NOTE — Progress Notes (Signed)
<>*<>*<>*<>*<>*<>*<>*<>*<>*<>*<>*<>*<>*<>*<>*<>*<>*<>*<>*<>*<>*<>*<>*<>*<> <>*<>*<>*<>*<>*<>*<>*<>*<>*<>*<>*<>*<>*<>*<>*<>*<>*<>*<>*<>*<>*<>*<>*<>*<>  -  Test results slightly outside the reference range are not unusual. If there is anything important, I will review this with you,  otherwise it is considered normal test values.  If you have further questions,  please do not hesitate to contact me at the office or via My Chart.   <>*<>*<>*<>*<>*<>*<>*<>*<>*<>*<>*<>*<>*<>*<>*<>*<>*<>*<>*<>*<>*<>*<>*<>*<> <>*<>*<>*<>*<>*<>*<>*<>*<>*<>*<>*<>*<>*<>*<>*<>*<>*<>*<>*<>*<>*<>*<>*<>*<>  -  Chol = 110 -  Excellent   - Very low risk for Heart Attack  / Stroke <>*<>*<>*<>*<>*<>*<>*<>*<>*<>*<>*<>*<>*<>*<>*<>*<>*<>*<>*<>*<>*<>*<>*<>*<>  -   A1c = 6.3% - still Blood sugar and A1c  elevated in the borderline and                                                        early or pre-diabetes range which has the same   300% increased risk for heart attack, stroke, cancer and                                         alzheimer- type vascular dementia as full blown diabetes.   But the good news is that diet, exercise with                                                  weight loss can cure the early diabetes at this point. <>*<>*<>*<>*<>*<>*<>*<>*<>*<>*<>*<>*<>*<>*<>*<>*<>*<>*<>*<>*<>*<>*<>*<>*<>  - Vitamin D = 74 - Excellent - Please keep dose same  <>*<>*<>*<>*<>*<>*<>*<>*<>*<>*<>*<>*<>*<>*<>*<>*<>*<>*<>*<>*<>*<>*<>*<>*<>  -  Magnesium  = 1.7  is very, very  low- goal is betw 2.0 - 2.5,   - So........Marland Kitchen  Recommend that you take Magnesium 500 mg tablet  3 x /day with meals   - also important to eat lots of  leafy green vegetables   - spinach - Kale - collards - greens - okra - asparagus - broccoli - quinoa   - squash - almonds - black, red, white beans-  peas - green beans <>*<>*<>*<>*<>*<>*<>*<>*<>*<>*<>*<>*<>*<>*<>*<>*<>*<>*<>*<>*<>*<>*<>*<>*<>  - All Else - CBC - Kidneys - Electrolytes - Liver  &  Thyroid - all  Normal / OK <>*<>*<>*<>*<>*<>*<>*<>*<>*<>*<>*<>*<>*<>*<>*<>*<>*<>*<>*<>*<>*<>*<>*<>*<> <>*<>*<>*<>*<>*<>*<>*<>*<>*<>*<>*<>*<>*<>*<>*<>*<>*<>*<>*<>*<>*<>*<>*<>*<>

## 2023-03-28 ENCOUNTER — Other Ambulatory Visit: Payer: Self-pay | Admitting: Physician Assistant

## 2023-03-28 DIAGNOSIS — M0609 Rheumatoid arthritis without rheumatoid factor, multiple sites: Secondary | ICD-10-CM

## 2023-03-29 NOTE — Telephone Encounter (Signed)
Last Fill: 01/01/2023  Eye exam: 11/13/2022 WNL    Labs: 03/25/2023 Glucose 108, ALT 32  Next Visit: 04/14/2023  Last Visit: 11/11/2022  [id number] arthritis of multiple sites with negative rheumatoid factor   Current Dose per office note 11/11/2022:  Plaquenil 200 mg 1 tablet by mouth twice daily Monday to Friday.   Okay to refill Plaquenil?

## 2023-03-31 NOTE — Progress Notes (Unsigned)
Office Visit Note  Patient: [name]             Date of Birth: 1955-12-15           MRN: [id number]             PCP: Lucky Cowboy, MD Referring: Lucky Cowboy, MD Visit Date: 04/14/2023 Occupation: @GUAROCC @  Subjective:  Pelvic pain   History of Present Illness: [name] is a 67 y.o. female with history of seronegative rheumatoid arthritis and osteoarthritis.  Patient remains on Plaquenil 200 mg 1 tablet by mouth twice daily Monday to Friday.  She continues to tolerate Plaquenil without any side effects and has not missed any doses recently.  She denies any recent rheumatoid arthritis flares.  She has not been experiencing any pain in her hands at night which has been an improvement.  She has been experiencing more frequent fibromyalgia flares.  During these flares she has profound fatigue as well as body aches.  Patient states that on 02/08/2023 she developed acute onset pelvic pain.  She states that she has evaluated the emergency department at which time she had a CT abdomen and pelvis as well as a pelvic ultrasound which were unremarkable.  She tried physical therapy which exacerbated her pain.  She has been under the care of of her PCP and has completed 2 courses of prednisone and 1 course of dexamethasone.  Her pain level is currently manageable after completing a course of dexamethasone.  She is able to ambulate with less difficulty at this time.  She is scheduled for an MRI of the pelvis on 04/18/2023 for further evaluation.   Activities of Daily Living:  Patient reports morning stiffness for 30 minutes.   Patient Reports nocturnal pain.  Difficulty dressing/grooming: Denies Difficulty climbing stairs: Denies Difficulty getting out of chair: Reports Difficulty using hands for taps, buttons, cutlery, and/or writing: Denies  Review of Systems  Constitutional:  Positive for fatigue.  HENT:  Positive for mouth dryness. Negative for mouth sores.   Eyes:  Negative  for dryness.  Respiratory:  Positive for cough and shortness of breath.   Cardiovascular:  Negative for chest pain and palpitations.  Gastrointestinal:  Negative for blood in stool, constipation and diarrhea.  Endocrine: Negative for increased urination.  Genitourinary:  Negative for involuntary urination.  Musculoskeletal:  Positive for joint pain, gait problem, joint pain, joint swelling and morning stiffness. Negative for myalgias, muscle weakness, muscle tenderness and myalgias.  Skin:  Negative for color change, rash, hair loss and sensitivity to sunlight.  Allergic/Immunologic: Negative for susceptible to infections.  Neurological:  Positive for dizziness and headaches.  Hematological:  Negative for swollen glands.  Psychiatric/Behavioral:  Positive for depressed mood. Negative for sleep disturbance. The patient is nervous/anxious.     PMFS History:  Patient Active Problem List   Diagnosis Date Noted   Osteopenia 09/03/2021   Seronegative rheumatoid arthritis (HCC) 03/12/2021   Hyperlipidemia 03/11/2021   Fatty liver 12/12/2019   Other abnormal glucose (prediabetes) 05/23/2019   Osteoarthritis of both knees 03/20/2015   OSA on CPAP 08/15/2014   Other chronic pain 01/31/2014   Depression, major, recurrent, in partial remission (HCC) 01/31/2014   Morbid obesity (HCC) - BMI 30+ with OSA 01/31/2014   Essential hypertension 08/09/2013   Vitamin D deficiency 08/09/2013    Past Medical History:  Diagnosis Date   Fibromyalgia    Hypertension    S/P total knee arthroplasty, left 01/07/2019   Simple renal cyst  12/12/2019   Incidentally noted on abd [place] 11/2019 - radiologist recommended further characterization with MRI w & w/o contrast   Hypoechoic lesions within the kidney which may simply represent mildly complicated cysts. The largest of these lies in the midportion of the kidney with increased echogenicity within and suggestion of internal color flow which may be related to septation.  MRI of the kidneys with and w   Status post total shoulder arthroplasty, left 01/07/2019   Vitamin D deficiency     Family History  Problem Relation Age of Onset   Hypertension Mother    Heart attack Mother    Hypertension Father    COPD Father    Cancer Father        thyroid   CVA Maternal Grandmother 50   Heart attack Maternal Grandfather 59   Deep vein thrombosis Maternal Grandfather    Colon cancer Neg Hx    Esophageal cancer Neg Hx    Stomach cancer Neg Hx    Past Surgical History:  Procedure Laterality Date   ABDOMINAL HYSTERECTOMY  2002   Dr. Chari Manning    COLONOSCOPY  2009   JOINT REPLACEMENT     arthroscopy   KNEE ARTHROPLASTY     TOTAL KNEE ARTHROPLASTY Right 04/14/2013   Procedure: RIGHT TOTAL KNEE ARTHROPLASTY AND LEFT KNEE INJECTION ;  Surgeon: Harvie Junior, MD;  Location: [facility] OR;  Service: Orthopedics;  Laterality: Right;   TOTAL KNEE ARTHROPLASTY Left 01/06/2019   Procedure: TOTAL KNEE ARTHROPLASTY;  Surgeon: Jodi Geralds, MD;  Location: [facility];  Service: Orthopedics;  Laterality: Left;   Social History   Social History Narrative   Not on file   Immunization History  Administered Date(s) Administered   DT (Pediatric) 07/05/2015   Influenza Inj Mdck Quad With Preservative 05/28/2017, 04/13/2018   Influenza Split 04/06/2012, 04/05/2013, 05/16/2014, 03/21/2015   Influenza,inj,Quad PF,6+ Mos 03/16/2019   Influenza,inj,quad, With Preservative 04/29/2016   PFIZER(Purple Top)SARS-COV-2 Vaccination 11/08/2019, 11/29/2019, 08/13/2020   Pneumococcal Conjugate-13 05/16/2014   Pneumococcal-Unspecified 06/22/1998   Td 06/22/2004   Zoster, Live 10/21/2015     Objective: Vital Signs: BP 124/72 (BP Location: Left Arm, Patient Position: Sitting, Cuff Size: Normal)   Pulse 91   Resp 15   Ht 5\' 4"  (1.626 m)   Wt 193 lb 9.6 oz (87.8 kg)   BMI 33.23 kg/m    Physical Exam Vitals and nursing note reviewed.  Constitutional:      Appearance: She is well-developed.   HENT:     Head: Normocephalic and atraumatic.  Eyes:     Conjunctiva/sclera: Conjunctivae normal.  Cardiovascular:     Rate and Rhythm: Normal rate and regular rhythm.     Heart sounds: Normal heart sounds.  Pulmonary:     Effort: Pulmonary effort is normal.     Breath sounds: Normal breath sounds.  Abdominal:     General: Bowel sounds are normal.     Palpations: Abdomen is soft.  Musculoskeletal:     Cervical back: Normal range of motion.  Lymphadenopathy:     Cervical: No cervical adenopathy.  Skin:    General: Skin is warm and dry.     Capillary Refill: Capillary refill takes less than 2 seconds.  Neurological:     Mental Status: She is alert and oriented to person, place, and time.  Psychiatric:        Behavior: Behavior normal.      Musculoskeletal Exam: Patient remained seated during examination today.  C-spine has limited  range of motion especially with lateral rotation to the left.  Trapezius muscle tension and tenderness bilaterally.  Shoulder joints, elbow joints, wrist joints, MCPs, PIPs, DIPs have good range of motion with no synovitis.  Complete fist formation bilaterally.  PIP and DIP thickening noted.  Hip joints able to discuss while seated.  Bilateral knee replacements have good range of motion.  Ankle joints have good range of motion with no tenderness or synovitis.  CDAI Exam: CDAI Score: -- Patient Global: 20 / 100; Provider Global: 20 / 100 Swollen: --; Tender: -- Joint Exam 04/14/2023   No joint exam has been documented for this visit   There is currently no information documented on the homunculus. Go to the Rheumatology activity and complete the homunculus joint exam.  Investigation: No additional findings.  Imaging: No results found.  Recent Labs: Lab Results  Component Value Date   WBC 5.6 03/25/2023   HGB 13.3 03/25/2023   PLT 308 03/25/2023   NA 141 03/25/2023   K 4.2 03/25/2023   CL 102 03/25/2023   CO2 30 03/25/2023   GLUCOSE 108  (H) 03/25/2023   BUN 12 03/25/2023   CREATININE 0.65 03/25/2023   BILITOT 0.6 03/25/2023   ALKPHOS 101 02/08/2023   AST 21 03/25/2023   ALT 32 (H) 03/25/2023   PROT 7.0 03/25/2023   ALBUMIN 4.6 02/08/2023   CALCIUM 9.7 03/25/2023   GFRAA 103 09/02/2020    Speciality Comments: PLQ Eye Exam: 11/13/2022 WNL @ [facility]  PLQ eye exam scheduled for 11/13/2022  Procedures:  No procedures performed Allergies: Latex, Celecoxib, Cephalexin, Codeine, Erythromycin, Sulfonamide derivatives, Trazodone and nefazodone, and Venlafaxine   Assessment / Plan:     Visit Diagnoses: Rheumatoid arthritis of multiple sites with negative rheumatoid factor (HCC) - RF-, +anti-CCP: She has no synovitis on examination today.  She has not had any signs or symptoms of a rheumatoid arthritis flare.  Her rheumatoid arthritis has been well-controlled taking Plaquenil 200 mg 1 tablet by mouth twice daily Monday through Friday.  She continues to tolerate Plaquenil without any side effects and has not had any interruptions in therapy.  She has been on 2 courses of prednisone and 1 course of dexamethasone recently for pelvic pain that she has been experiencing.  While on prednisone the nocturnal pain she was experiencing in her hands has resolved.  Patient is scheduled for a MRI of the pelvis on 04/18/2023 for further evaluation. She will remain on Plaquenil as monotherapy.  She was advised to notify us if she develops any new or worsening symptoms.  Patient was advised to have MRI of the pelvis results forwarded to our office to review.  She will follow-up in the office in 5 months or sooner if needed.  High risk medication use - Plaquenil 200 mg 1 tablet by mouth twice daily Monday to Friday. PLQ Eye Exam: 11/13/2022 WNL @ [facility]  CBC and CMP updated on 03/25/23.   Positive anti-CCP test - Anti-CCP 39 (weak positive), RF negative, ESR 6.  Primary osteoarthritis of both hands: PIP and DIP thickening.  No synovitis  noted.  No nocturnal pain currently.  Status post total bilateral knee replacement: Doing well.  Good range of motion with no discomfort.  Primary osteoarthritis of both feet: Ankle joints have good range of motion with no joint tenderness or synovitis.  Fibromyalgia: Patient continues to experience interval myalgias and muscle tenderness due to fibromyalgia.  She has had 2 recent fibromyalgia flares.  Over the  past 2 months her body has been under increased stress which is likely contributing to the increased frequency of flares.  Pelvic pain: Patient developed acute onset pelvic pain on 02/08/2023 with no identifiable trigger.  No injury or fall prior to the onset of symptoms.  Her symptoms were severe to the point that she had difficulty ambulating.  She was having difficulty getting in and out of bed as well as getting in and out of the car due to the severity of symptoms.  She was having to use a walker to assist with ambulation.  She was evaluated in the emergency department and had CT abdomen and pelvis as well as an ultrasound of the pelvis for further evaluation which were unremarkable.  She has been under the care of her PCP.  She has tried physical therapy but her symptoms were exacerbated.  She has taken 2 courses of prednisone as well as a course of dexamethasone which have provided temporary relief.  Her symptoms are currently manageable and she is able to ambulate with less difficulty.   She is not having to use a walker currently.  She is scheduled for an MRI of the pelvis on 04/18/2023 for further evaluation.  Other medical conditions are listed as follows:   History of prediabetes  Mixed hyperlipidemia  Essential hypertension: Blood pressure was 124/72 today in the office.  Depression, major, recurrent, in partial remission (HCC)  Vitamin D deficiency  OSA on CPAP  Orders: No orders of the defined types were placed in this encounter.  No orders of the defined types were  placed in this encounter.    Follow-Up Instructions: Return in about 5 months (around 09/12/2023) for Rheumatoid arthritis, Osteoarthritis.   Gearldine Bienenstock, PA-C  Note - This record has been created using Dragon software.  Chart creation errors have been sought, but may not always  have been located. Such creation errors do not reflect on  the standard of medical care.

## 2023-04-14 ENCOUNTER — Ambulatory Visit: Payer: Medicare PPO | Attending: Physician Assistant | Admitting: Physician Assistant

## 2023-04-14 ENCOUNTER — Encounter: Payer: Self-pay | Admitting: Physician Assistant

## 2023-04-14 VITALS — BP 124/72 | HR 91 | Resp 15 | Ht 64.0 in | Wt 193.6 lb

## 2023-04-14 DIAGNOSIS — Z87898 Personal history of other specified conditions: Secondary | ICD-10-CM | POA: Diagnosis not present

## 2023-04-14 DIAGNOSIS — Z96653 Presence of artificial knee joint, bilateral: Secondary | ICD-10-CM | POA: Diagnosis not present

## 2023-04-14 DIAGNOSIS — E782 Mixed hyperlipidemia: Secondary | ICD-10-CM

## 2023-04-14 DIAGNOSIS — M19042 Primary osteoarthritis, left hand: Secondary | ICD-10-CM

## 2023-04-14 DIAGNOSIS — M0609 Rheumatoid arthritis without rheumatoid factor, multiple sites: Secondary | ICD-10-CM | POA: Diagnosis not present

## 2023-04-14 DIAGNOSIS — R102 Pelvic and perineal pain unspecified side: Secondary | ICD-10-CM

## 2023-04-14 DIAGNOSIS — M797 Fibromyalgia: Secondary | ICD-10-CM

## 2023-04-14 DIAGNOSIS — M19072 Primary osteoarthritis, left ankle and foot: Secondary | ICD-10-CM

## 2023-04-14 DIAGNOSIS — F3341 Major depressive disorder, recurrent, in partial remission: Secondary | ICD-10-CM

## 2023-04-14 DIAGNOSIS — I10 Essential (primary) hypertension: Secondary | ICD-10-CM

## 2023-04-14 DIAGNOSIS — M19071 Primary osteoarthritis, right ankle and foot: Secondary | ICD-10-CM

## 2023-04-14 DIAGNOSIS — R768 Other specified abnormal immunological findings in serum: Secondary | ICD-10-CM | POA: Diagnosis not present

## 2023-04-14 DIAGNOSIS — Z79899 Other long term (current) drug therapy: Secondary | ICD-10-CM | POA: Diagnosis not present

## 2023-04-14 DIAGNOSIS — G4733 Obstructive sleep apnea (adult) (pediatric): Secondary | ICD-10-CM

## 2023-04-14 DIAGNOSIS — M19041 Primary osteoarthritis, right hand: Secondary | ICD-10-CM | POA: Diagnosis not present

## 2023-04-14 DIAGNOSIS — E559 Vitamin D deficiency, unspecified: Secondary | ICD-10-CM

## 2023-04-14 DIAGNOSIS — R7681 Abnormal rheumatoid factor and anti-citrullinated protein antibody without rheumatoid arthritis: Secondary | ICD-10-CM

## 2023-04-15 ENCOUNTER — Other Ambulatory Visit: Payer: Self-pay | Admitting: Nurse Practitioner

## 2023-04-18 ENCOUNTER — Ambulatory Visit
Admission: RE | Admit: 2023-04-18 | Discharge: 2023-04-18 | Disposition: A | Discharge status: Home / Self Care | Payer: Medicare PPO | Source: Ambulatory Visit | Attending: Nurse Practitioner | Admitting: Nurse Practitioner

## 2023-04-18 DIAGNOSIS — Z90710 Acquired absence of both cervix and uterus: Secondary | ICD-10-CM | POA: Diagnosis not present

## 2023-04-18 DIAGNOSIS — R1031 Right lower quadrant pain: Secondary | ICD-10-CM

## 2023-04-18 DIAGNOSIS — K5730 Diverticulosis of large intestine without perforation or abscess without bleeding: Secondary | ICD-10-CM | POA: Diagnosis not present

## 2023-04-18 DIAGNOSIS — R1032 Left lower quadrant pain: Secondary | ICD-10-CM

## 2023-04-18 DIAGNOSIS — R102 Pelvic and perineal pain: Secondary | ICD-10-CM

## 2023-04-18 MED ORDER — GADOPICLENOL 0.5 MMOL/ML IV SOLN
9.0000 mL | Freq: Once | INTRAVENOUS | Status: AC | PRN
  Administered 2023-04-18: 9 mL via INTRAVENOUS

## 2023-04-22 ENCOUNTER — Other Ambulatory Visit: Payer: Self-pay

## 2023-04-22 ENCOUNTER — Encounter: Payer: Self-pay | Admitting: Nurse Practitioner

## 2023-04-22 ENCOUNTER — Ambulatory Visit (INDEPENDENT_AMBULATORY_CARE_PROVIDER_SITE_OTHER): Payer: Medicare PPO | Admitting: Nurse Practitioner

## 2023-04-22 VITALS — BP 110/78 | HR 97 | Temp 98.1°F | Ht 64.0 in | Wt 191.6 lb

## 2023-04-22 DIAGNOSIS — R300 Dysuria: Secondary | ICD-10-CM | POA: Diagnosis not present

## 2023-04-22 DIAGNOSIS — Z1152 Encounter for screening for COVID-19: Secondary | ICD-10-CM | POA: Diagnosis not present

## 2023-04-22 DIAGNOSIS — I10 Essential (primary) hypertension: Secondary | ICD-10-CM

## 2023-04-22 DIAGNOSIS — R058 Other specified cough: Secondary | ICD-10-CM

## 2023-04-22 LAB — POC COVID19 BINAXNOW: SARS Coronavirus 2 Ag: NEGATIVE

## 2023-04-22 MED ORDER — BENZONATATE 100 MG PO CAPS
100.0000 mg | ORAL_CAPSULE | Freq: Four times a day (QID) | ORAL | 1 refills | Status: DC | PRN
Start: 2023-04-22 — End: 2023-09-20

## 2023-04-22 MED ORDER — MONTELUKAST SODIUM 10 MG PO TABS: 10.0000 mg | ORAL_TABLET | Freq: Every day | ORAL | 2 refills | Status: DC

## 2023-04-22 MED ORDER — IPRATROPIUM BROMIDE 0.03 % NA SOLN: 2.0000 | Freq: Three times a day (TID) | NASAL | 2 refills | Status: DC

## 2023-04-22 MED ORDER — PROMETHAZINE-DM 6.25-15 MG/5ML PO SYRP: 5.0000 mL | ORAL_SOLUTION | Freq: Four times a day (QID) | ORAL | 1 refills | Status: DC | PRN

## 2023-04-22 NOTE — Progress Notes (Signed)
Assessment and Plan:  [name] was seen today for acute visit.  Diagnoses and all orders for this visit:  Essential hypertension - continue medications, DASH diet, exercise and monitor at home. Call if greater than 130/80.   Encounter for screening for COVID-19 -     POC COVID-19- negative  Dysuria Push fluids and will await lab results -     Urinalysis, Routine w reflex microscopic -     Urine Culture  Allergic cough Use Mucinex DM during the day and Promethazine DM cough syrup at night- can make you sleepy Switch from Zyrtec to Allegra- can use generic, take this in the am.  Then starting new medication Montelukast that you will take at bedtime for allergies Use Ipratropium nasal spray- 2 sprays each nostril every 8 hours as needed Benzonatate 1 capsule every 6 hours for cough If no improvement by next week notify the office -     ipratropium (ATROVENT) 0.03 % nasal spray; Place 2 sprays into the nose 3 (three) times daily. -     benzonatate (TESSALON PERLES) 100 MG capsule; Take 1 capsule (100 mg total) by mouth every 6 (six) hours as needed for cough. -     promethazine-dextromethorphan (PROMETHAZINE-DM) 6.25-15 MG/5ML syrup; Take 5 mLs by mouth 4 (four) times daily as needed for cough. -     montelukast (SINGULAIR) 10 MG tablet; Take 1 tablet (10 mg total) by mouth daily.       Further disposition pending results of labs. Discussed med's effects and SE's.   Over 30 minutes of exam, counseling, chart review, and critical decision making was performed.   Future Appointments  Date Time Provider Department Center  06/30/2023 10:30 AM Adela Glimpse, NP [facility] None  09/22/2023  2:00 PM Pollyann Savoy, MD [facility] None  09/30/2023 10:00 AM Lucky Cowboy, MD [facility] None  01/06/2024 10:30 AM Adela Glimpse, NP [facility] None  01/20/2024 10:00 AM Lomax, Amy, NP [facility] None     ------------------------------------------------------------------------------------------------------------------   HPI BP 110/78   Pulse 97   Temp 98.1 F (36.7 C)   Ht 5\' 4"  (1.626 m)   Wt 191 lb 9.6 oz (86.9 kg)   SpO2 94%   BMI 32.89 kg/m  67 y.o.female presents for productive cough of clear mucus. She has nasal mucus which is also clear. Denies fever, nausea and vomiting, body aches. Has been occurring since 04/11/23. Has been taking Safe tussin DM, Cold relief plus and Benzotate. She also takes Zyrtec daily.   Also noting burning on urination. Denies frequency and blood in urine.  Pelvic pain has improved.   BP well controlled with hydrochlorothiazide 25 mg every day  BP Readings from Last 3 Encounters:  04/22/23 110/78  04/14/23 124/72  03/25/23 120/70  Denies headaches, chest pain, shortness of breath and dizziness   BMI is Body mass index is 32.89 kg/m., she has not been working on diet and exercise. Wt Readings from Last 3 Encounters:  04/22/23 191 lb 9.6 oz (86.9 kg)  04/14/23 193 lb 9.6 oz (87.8 kg)  03/25/23 193 lb 12.8 oz (87.9 kg)     Past Medical History:  Diagnosis Date   Fibromyalgia    Hypertension    S/P total knee arthroplasty, left 01/07/2019   Simple renal cyst 12/12/2019   Incidentally noted on abd [place] 11/2019 - radiologist recommended further characterization with MRI w & w/o contrast   Hypoechoic lesions within the kidney which may simply represent mildly complicated cysts. The largest of these lies  in the midportion of the kidney with increased echogenicity within and suggestion of internal color flow which may be related to septation. 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   acyclovir (ZOVIRAX) 400 MG tablet Take 400 mg by mouth as needed.   aspirin EC 81 MG tablet Take 81 mg by mouth daily.   cetirizine (ZYRTEC) 10 MG tablet Take 10 mg by mouth daily.    Chlorphen-Phenyleph-ASA (COLD RELIEF PLUS PO) Take by mouth.   Cholecalciferol (VITAMIN D PO) Take 8,000 Int'l Units by mouth daily. 2000 units per capsule   CINNAMON PO Take 2,000 mg by mouth daily.   clonazePAM (KLONOPIN) 1 MG tablet Take 1 mg by mouth at bedtime as needed for anxiety.    Dextromethorphan-guaiFENesin (SAFE TUSSIN 30 PO) Take by mouth.   diclofenac Sodium (VOLTAREN) 1 % GEL Apply topically as needed.   esomeprazole (NEXIUM) 40 MG capsule TAKE 1 CAPSULE BY MOUTH DAILY FOR STOMACH ACID OR REFLUX   glucose blood (ACCU-CHEK AVIVA PLUS) test strip CHECK BLOOD SUGAR 1 TIME DAILY. DX-R73.03   hydrochlorothiazide (HYDRODIURIL) 25 MG tablet TAKE 1 TABLET BY MOUTH DAILY FOR BLOOD PRESSURE AND FLUID RETENTION OR ANKLE SWELLING   hydroxychloroquine (PLAQUENIL) 200 MG tablet TAKE 1 TABLET BY MOUTH TWICE DAILY( EVERY 12 HOURS) MONDAY TO FRIDAY   Ibuprofen (ADVIL PO) Take by mouth as needed.   Lancets (ACCU-CHEK MULTICLIX) lancets Use to check blood glucose daily   Lysine 500 MG TABS Take 1,000 mg by mouth daily. Currently taking 2 pill QD   Magnesium 500 MG TABS Take 2,000 mg by mouth daily. Takes 4 tablets daily   Multiple Vitamin (MULTIVITAMIN WITH MINERALS) TABS tablet Take 1 tablet by mouth daily.   ondansetron (ZOFRAN) 4 MG tablet Take 1 tablet (4 mg total) by mouth daily as needed  for nausea or vomiting.   QUEtiapine (SEROQUEL) 100 MG tablet Take 100 mg by mouth 3 (three) times daily.   rosuvastatin (CRESTOR) 40 MG tablet TAKE 1/2 TABLET BY MOUTH DAILY FOR CHOLESTEROL   vitamin C (ASCORBIC ACID) 500 MG tablet Take 500 mg by mouth 2 (two) times daily.   zinc gluconate 50 MG tablet Take 50 mg by mouth daily.   dexamethasone (DECADRON) 4 MG tablet Take 1 tab 3 x day - 3 days, then 2 x day - 3 days, then 1 tab daily (Patient not taking: Reported on 04/14/2023)   diazepam (VALIUM) 2 MG tablet Take 1 tablet (2 mg total) by mouth every 6 (six) hours as needed (Muscle Spasm). (Patient not taking: Reported on 04/14/2023)   lamoTRIgine (LAMICTAL) 150 MG tablet  Take 1 & 1/2 tablets at bedtime (Patient taking differently: once. Take 1 tablet at bedtime)   meloxicam (MOBIC) 15 MG tablet Take  1/2 to 1 tablet  Daily  with Food  for Pain & Inflammation (Patient not taking: Reported on 04/14/2023)   No current facility-administered medications on file prior to visit.    ROS: all negative except above.   Physical Exam:  BP 110/78   Pulse 97   Temp 98.1 F (36.7 C)   Ht 5\' 4"  (1.626 m)   Wt 191 lb 9.6 oz (86.9 kg)   SpO2 94%   BMI 32.89 kg/m   General Appearance: Well nourished, in no apparent distress. Eyes: PERRLA, EOMs, conjunctiva no swelling or erythema Sinuses: No Frontal/maxillary tenderness ENT/Mouth: Ext aud canals clear, TMs without erythema, bulging. No erythema, swelling, or exudate on post pharynx.Marland Kitchen Hearing normal.  Neck: Supple, thyroid normal.  Respiratory: Respiratory effort normal, BS equal bilaterally without rales, rhonchi, wheezing or stridor. Persistent dry cough throughout visit Cardio: RRR with no MRGs. Brisk peripheral pulses without edema.  Abdomen: Soft, + BS.  Non tender, no guarding, rebound, hernias, masses. Lymphatics: Non tender without lymphadenopathy.  Musculoskeletal: Full ROM, 5/5 strength, normal gait.  Skin: Warm, dry without rashes,  lesions, ecchymosis.  Neuro: Cranial nerves intact. Normal muscle tone, no cerebellar symptoms. Sensation intact.  Psych: Awake and oriented X 3, normal affect, Insight and Judgment appropriate.     Raynelle Dick, NP 11:37 AM [facility]

## 2023-04-22 NOTE — Patient Instructions (Addendum)
Use Mucinex DM during the day and Promethazine DM cough syrup at night- can make you sleepy  Switch from Zyrtec to Allegra- can use generic, take this in the am.  Then starting new medication Montelukast that you will take at bedtime for allergies  Use Ipratropium nasal spray- 2 sprays each nostril every 8 hours as needed  Benzonatate 1 capsule every 6 hours for cough    Cough, Adult Coughing is a reflex that clears your throat and airways (respiratory system). It helps heal and protect your lungs. It is normal to cough from time to time. A cough that happens with other symptoms or that lasts a long time may be a sign of a condition that needs treatment. A short-term (acute) cough may only last 2-3 weeks. A long-term (chronic) cough may last 8 or more weeks. Coughing is often caused by: Diseases, such as: An infection of the respiratory system. Asthma or other heart or lung diseases. Gastroesophageal reflux. This is when acid comes back up from the stomach. Breathing in things that irritate your lungs. Allergies. Postnasal drip. This is when mucus runs down the back of your throat. Smoking. Some medicines. Follow these instructions at home: Medicines Take over-the-counter and prescription medicines only as told by your health care provider. Talk with your provider before you take cough medicine (cough suppressants). Eating and drinking Do not drink alcohol. Avoid caffeine. Drink enough fluid to keep your pee (urine) pale yellow. Lifestyle Avoid cigarette smoke. Do not use any products that contain nicotine or tobacco. These products include cigarettes, chewing tobacco, and vaping devices, such as e-cigarettes. If you need help quitting, ask your provider. Avoid things that make you cough. These may include perfumes, candles, cleaning products, or campfire smoke. General instructions  Watch for any changes to your cough. Tell your provider about them. Always cover your mouth when  you cough. If the air is dry in your bedroom or home, use a cool mist vaporizer or humidifier. If your cough is worse at night, try to sleep in a semi-upright position. Rest as needed. Contact a health care provider if: You have new symptoms, or your symptoms get worse. You cough up pus. You have a fever that does not go away or a cough that does not get better after 2-3 weeks. You cannot control your cough with medicine, and you are losing sleep. You have pain that gets worse or is not helped with medicine. You lose weight for no clear reason. You have night sweats. Get help right away if: You cough up blood. You have trouble breathing. Your heart is beating very fast. These symptoms may be an emergency. Get help right away. Call 911. Do not wait to see if the symptoms will go away. Do not drive yourself to the hospital. This information is not intended to replace advice given to you by your health care provider. Make sure you discuss any questions you have with your health care provider. Document Revised: 02/06/2022 Document Reviewed: 02/06/2022 Elsevier Patient Education  2024 [employer].

## 2023-04-23 LAB — URINALYSIS, ROUTINE W REFLEX MICROSCOPIC
Bilirubin Urine: NEGATIVE
Glucose, UA: NEGATIVE
Hgb urine dipstick: NEGATIVE
Ketones, ur: NEGATIVE
Leukocytes,Ua: NEGATIVE
Nitrite: NEGATIVE
Protein, ur: NEGATIVE
Specific Gravity, Urine: 1.003 (ref 1.001–1.035)
pH: 6.5 (ref 5.0–8.0)

## 2023-04-23 LAB — URINE CULTURE
MICRO NUMBER:: 15670814
Result:: NO GROWTH
SPECIMEN QUALITY:: ADEQUATE

## 2023-04-26 DIAGNOSIS — G4733 Obstructive sleep apnea (adult) (pediatric): Secondary | ICD-10-CM | POA: Diagnosis not present

## 2023-04-27 ENCOUNTER — Encounter: Payer: Self-pay | Admitting: Nurse Practitioner

## 2023-04-27 DIAGNOSIS — R102 Pelvic and perineal pain unspecified side: Secondary | ICD-10-CM

## 2023-04-27 DIAGNOSIS — M76899 Other specified enthesopathies of unspecified lower limb, excluding foot: Secondary | ICD-10-CM

## 2023-04-27 DIAGNOSIS — R1032 Left lower quadrant pain: Secondary | ICD-10-CM

## 2023-04-27 DIAGNOSIS — R1031 Right lower quadrant pain: Secondary | ICD-10-CM

## 2023-05-25 DIAGNOSIS — G4733 Obstructive sleep apnea (adult) (pediatric): Secondary | ICD-10-CM | POA: Diagnosis not present

## 2023-05-26 ENCOUNTER — Other Ambulatory Visit: Payer: Self-pay | Admitting: Internal Medicine

## 2023-05-26 DIAGNOSIS — Z1231 Encounter for screening mammogram for malignant neoplasm of breast: Secondary | ICD-10-CM

## 2023-05-26 DIAGNOSIS — G4733 Obstructive sleep apnea (adult) (pediatric): Secondary | ICD-10-CM | POA: Diagnosis not present

## 2023-05-26 DIAGNOSIS — F3132 Bipolar disorder, current episode depressed, moderate: Secondary | ICD-10-CM | POA: Diagnosis not present

## 2023-05-28 ENCOUNTER — Other Ambulatory Visit: Payer: Self-pay | Admitting: Nurse Practitioner

## 2023-05-28 ENCOUNTER — Telehealth: Payer: Self-pay | Admitting: Nurse Practitioner

## 2023-05-28 MED ORDER — PREDNISONE 10 MG PO TABS: ORAL_TABLET | ORAL | 0 refills | Status: DC

## 2023-05-28 NOTE — Telephone Encounter (Signed)
Patient said she sees you a lot for adductor tendonitis. Patient has a lot of pain today. She has been taking the Meloxicam and Tylenol but nothing has helped. She asked if you could please send in prednisone to [id number] - [place], [place] - [address] AT [facility] OF [address] & [address]

## 2023-06-03 DIAGNOSIS — G4733 Obstructive sleep apnea (adult) (pediatric): Secondary | ICD-10-CM | POA: Diagnosis not present

## 2023-06-18 ENCOUNTER — Other Ambulatory Visit: Payer: Self-pay | Admitting: Physician Assistant

## 2023-06-18 DIAGNOSIS — M0609 Rheumatoid arthritis without rheumatoid factor, multiple sites: Secondary | ICD-10-CM

## 2023-06-18 NOTE — Telephone Encounter (Signed)
Last Fill: 03/29/2023  Eye exam:  11/13/2022 WNL    Labs: 03/25/2023 Glucose 108, ALT 32  Next Visit: 09/22/2023  Last Visit: 04/14/2023  DX: Rheumatoid arthritis of multiple sites with negative rheumatoid factor   Current Dose per office note 04/14/2023: Plaquenil 200 mg 1 tablet by mouth twice daily Monday to Friday.   Okay to refill Plaquenil?

## 2023-06-30 ENCOUNTER — Ambulatory Visit (INDEPENDENT_AMBULATORY_CARE_PROVIDER_SITE_OTHER): Payer: 59 | Admitting: Nurse Practitioner

## 2023-06-30 ENCOUNTER — Encounter: Payer: Self-pay | Admitting: Nurse Practitioner

## 2023-06-30 VITALS — BP 124/80 | HR 85 | Temp 97.9°F | Ht 64.0 in | Wt 198.2 lb

## 2023-06-30 DIAGNOSIS — R7309 Other abnormal glucose: Secondary | ICD-10-CM

## 2023-06-30 DIAGNOSIS — G4733 Obstructive sleep apnea (adult) (pediatric): Secondary | ICD-10-CM

## 2023-06-30 DIAGNOSIS — M170 Bilateral primary osteoarthritis of knee: Secondary | ICD-10-CM

## 2023-06-30 DIAGNOSIS — E559 Vitamin D deficiency, unspecified: Secondary | ICD-10-CM | POA: Diagnosis not present

## 2023-06-30 DIAGNOSIS — M797 Fibromyalgia: Secondary | ICD-10-CM

## 2023-06-30 DIAGNOSIS — Z79899 Other long term (current) drug therapy: Secondary | ICD-10-CM

## 2023-06-30 DIAGNOSIS — I10 Essential (primary) hypertension: Secondary | ICD-10-CM | POA: Diagnosis not present

## 2023-06-30 DIAGNOSIS — E782 Mixed hyperlipidemia: Secondary | ICD-10-CM

## 2023-06-30 DIAGNOSIS — F3341 Major depressive disorder, recurrent, in partial remission: Secondary | ICD-10-CM

## 2023-06-30 DIAGNOSIS — M0600 Rheumatoid arthritis without rheumatoid factor, unspecified site: Secondary | ICD-10-CM

## 2023-06-30 NOTE — Patient Instructions (Signed)

## 2023-06-30 NOTE — Progress Notes (Signed)
 FOLLOW UP  Assessment:   Diagnoses and all orders for this visit:  Essential hypertension Discussed DASH (Dietary Approaches to Stop Hypertension) DASH diet is lower in sodium than a typical American diet. Cut back on foods that are high in saturated fat, cholesterol, and trans fats. Eat more whole-grain foods, fish, poultry, and nuts Remain active and exercise as tolerated daily.  Monitor BP at home-Call if greater than 130/80.  Check CMP/CBC  Vitamin D  deficiency Continue supplement. Check and monitor levels  Prediabetes Education: Reviewed 'ABCs' of diabetes management  Discussed goals to be met and/or maintained include A1C (<7) Blood pressure (<130/80) Cholesterol (LDL <70) Continue Eye Exam yearly  Continue Dental Exam Q6 mo Discussed dietary recommendations Check A1C  Morbid obesity - BMI 30+ with OSA  Discussed appropriate BMI Diet modification. Physical activity. Encouraged/praised to build confidence.  Mixed hyperlipidemia Discussed lifestyle modifications. Recommended diet heavy in fruits and veggies, omega 3's. Decrease consumption of animal meats, cheeses, and dairy products. Remain active and exercise as tolerated. Continue to monitor. Check lipids/TSH  Medication management All medications discussed and reviewed in full. All questions and concerns regarding medications addressed.    Fibromyalgia Lifestyle discussed: diet/exerise, sleep hygiene, stress management, hydration Continue current medications for sleep management  Depression, major, recurrent, in partial remission (HCC)  Psych manages meds  Lifestyle discussed: diet/exerise, sleep hygiene, stress management, hydration Reviewed relaxation techniques.  Sleep hygiene. Recommended mindfulness meditation and exercise.   Psychoeducation:  encouraged personality growth wand development through coping techniques and problem-solving skills.  OSA on CPAP 100% compliance Improved. Continue to  monitor  Primary osteoarthritis of both knees Followed by Dr. Yvone, s/p bil TKA  Continue to monitor  Seronegative RA (HCC) Followed by Dr. Dolphus;  On hydroxychloroquine  and ginger root Continue to monitor  Orders Placed This Encounter  Procedures   CBC with Differential/Platelet   COMPLETE METABOLIC PANEL WITH GFR   Lipid panel   Hemoglobin A1c   Notify office for further evaluation and treatment, questions or concerns if any reported s/s fail to improve.   The patient was advised to call back or seek an in-person evaluation if any symptoms worsen or if the condition fails to improve as anticipated.   Further disposition pending results of labs. Discussed med's effects and SE's.    I discussed the assessment and treatment plan with the patient. The patient was provided an opportunity to ask questions and all were answered. The patient agreed with the plan and demonstrated an understanding of the instructions.  Discussed med's effects and SE's. Screening labs and tests as requested with regular follow-up as recommended.  I provided 35 minutes of face-to-face time during this encounter including counseling, chart review, and critical decision making was preformed.   Future Appointments  Date Time Provider Department Center  09/16/2023  9:30 AM [facility] 2 [facility]  09/22/2023  2:00 PM Dolphus Reiter, MD [facility] None  09/30/2023 10:00 AM Tonita Fallow, MD [facility] None  01/06/2024 10:30 AM Laurice President, NP [facility] None  01/20/2024 10:00 AM Cary No, NP [facility] None     Subjective:  [name] is a 68 y.o. female who presents for a 3 month follow up. She has Essential hypertension; Vitamin D  deficiency; Other chronic pain; Depression, major, recurrent, in partial remission (HCC); Morbid obesity (HCC) - BMI 30+ with OSA; OSA on CPAP; Osteoarthritis of both knees; Other abnormal glucose (prediabetes); Fatty liver; Hyperlipidemia; Seronegative  rheumatoid arthritis (HCC); and Osteopenia on their problem list.  Overall she reports feeling well.  She has no new or additional concerns in clinic today.  She had a new sleep study 10/2022 with Dr. Chalice, Neurology.  She continues to wear a CPAP with reports of improved sleep.  Continues to follow Dr. Dolphus for RA.  Patient has been on SS Disability since age 66 (2007) for Panic, Anxiety & Depression, and also has fibromyalgia. She is followed by PA Wonda Molt for mental health management. She reports she is doing very well at this time.   She is followed by mental health.  Reports mood is stable.  She is followed by Dr. Yvone for arthritis; s/p bil TKA. Newly seeing Dr. Dolphus for seronegative RA complicated by OA, now on hydroxychloroquine  and started on ginger root. Has burning in hands that bothers her at night. Taking tylenol  to help managed.  She has a hx of vertigo.  Had not had a flare in many years, however has had x2 episodes in the last two weeks.  Associated nausea.  She has taken Meclizine  and Zofran  with effectiveness.  She has not completed any past PT.     BMI is Body mass index is 34.02 kg/m., she has been working on diet, exercise. Wt Readings from Last 3 Encounters:  06/30/23 198 lb 3.2 oz (89.9 kg)  04/22/23 191 lb 9.6 oz (86.9 kg)  04/14/23 193 lb 9.6 oz (87.8 kg)    Her blood pressure has been controlled at home, today their BP is BP: 124/80 She does not workout. She denies chest pain, shortness of breath, dizziness.   She is on cholesterol medication (crestor  20 mg daily) and denies myalgias. Her LDL cholesterol is at goal. The cholesterol last visit was:   Lab Results  Component Value Date   CHOL 110 03/25/2023   HDL 42 (L) 03/25/2023   LDLCALC 48 03/25/2023   TRIG 110 03/25/2023   CHOLHDL 2.6 03/25/2023    She has not been working on diet and exercise for prediabetes, and denies foot ulcerations, increased appetite, nausea, paresthesia of the  feet, polydipsia, polyuria, visual disturbances, vomiting and weight loss. Last A1C in the office was:  Lab Results  Component Value Date   HGBA1C 6.3 (H) 03/25/2023   Last GFR: Lab Results  Component Value Date   GFRNONAA >60 02/08/2023   Patient is on Vitamin D  supplement and at goal at recent check:   Lab Results  Component Value Date   VD25OH 74 03/25/2023       Medication Review: Current Outpatient Medications on File Prior to Visit  Medication Sig Dispense Refill   acetaminophen  (TYLENOL ) 500 MG tablet Take 500 mg by mouth every 6 (six) hours as needed.     acyclovir  (ZOVIRAX ) 400 MG tablet Take 400 mg by mouth as needed.     aspirin  EC 81 MG tablet Take 81 mg by mouth daily.     benzonatate  (TESSALON  PERLES) 100 MG capsule Take 1 capsule (100 mg total) by mouth every 6 (six) hours as needed for cough. 30 capsule 1   cetirizine (ZYRTEC) 10 MG tablet Take 10 mg by mouth daily. Switches with Allegra.     Chlorphen-Phenyleph-ASA (COLD RELIEF PLUS PO) Take by mouth.     Cholecalciferol (VITAMIN D  PO) Take 8,000 Int'l Units by mouth daily. 2000 units per capsule     CINNAMON PO Take 2,000 mg by mouth daily.     clonazePAM  (KLONOPIN ) 1 MG tablet Take 1 mg by mouth at bedtime as needed  for anxiety.      Dextromethorphan-guaiFENesin (SAFE TUSSIN 30 PO) Take by mouth.     diazepam  (VALIUM ) 2 MG tablet Take 1 tablet (2 mg total) by mouth every 6 (six) hours as needed (Muscle Spasm). 20 tablet 0   diclofenac Sodium (VOLTAREN) 1 % GEL Apply topically as needed.     esomeprazole  (NEXIUM ) 40 MG capsule TAKE 1 CAPSULE BY MOUTH DAILY FOR STOMACH ACID OR REFLUX 90 capsule 1   glucose blood (ACCU-CHEK AVIVA PLUS) test strip CHECK BLOOD SUGAR 1 TIME DAILY. DX-R73.03 100 each 1   hydrochlorothiazide  (HYDRODIURIL ) 25 MG tablet TAKE 1 TABLET BY MOUTH DAILY FOR BLOOD PRESSURE AND FLUID RETENTION OR ANKLE SWELLING 90 tablet 3   hydroxychloroquine  (PLAQUENIL ) 200 MG tablet TAKE 1 TABLET BY MOUTH  TWICE DAILY( EVERY 12 HOURS) MONDAY TO FRIDAY 120 tablet 0   Ibuprofen (ADVIL PO) Take by mouth as needed.     ipratropium (ATROVENT ) 0.03 % nasal spray Place 2 sprays into the nose 3 (three) times daily. 30 mL 2   lamoTRIgine  (LAMICTAL ) 150 MG tablet Take 1 & 1/2 tablets at bedtime (Patient taking differently: once. Take 1 tablet at bedtime) 135 tablet 1   Lancets (ACCU-CHEK MULTICLIX) lancets Use to check blood glucose daily 100 each PRN   Lysine 500 MG TABS Take 1,000 mg by mouth daily. Currently taking 2 pill QD     Magnesium  500 MG TABS Take 2,000 mg by mouth daily. Takes 4 tablets daily     meloxicam  (MOBIC ) 15 MG tablet Take  1/2 to 1 tablet  Daily  with Food  for Pain & Inflammation 90 tablet 3   montelukast  (SINGULAIR ) 10 MG tablet Take 1 tablet (10 mg total) by mouth daily. 30 tablet 2   Multiple Vitamin (MULTIVITAMIN WITH MINERALS) TABS tablet Take 1 tablet by mouth daily.     ondansetron  (ZOFRAN ) 4 MG tablet Take 1 tablet (4 mg total) by mouth daily as needed for nausea or vomiting. 30 tablet 1   predniSONE  (DELTASONE ) 10 MG tablet 1 tab 3 x day for 2 days, then 1 tab 2 x day for 2 days, then 1 tab 1 x day for 3 days 13 tablet 0   promethazine -dextromethorphan (PROMETHAZINE -DM) 6.25-15 MG/5ML syrup Take 5 mLs by mouth 4 (four) times daily as needed for cough. 240 mL 1   QUEtiapine  (SEROQUEL ) 100 MG tablet Take 100 mg by mouth 3 (three) times daily.     rosuvastatin  (CRESTOR ) 40 MG tablet TAKE 1/2 TABLET BY MOUTH DAILY FOR CHOLESTEROL 45 tablet 3   vitamin C (ASCORBIC ACID) 500 MG tablet Take 500 mg by mouth 2 (two) times daily.     zinc gluconate 50 MG tablet Take 50 mg by mouth daily.     No current facility-administered medications on file prior to visit.    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)    Current Problems (verified) Patient Active Problem List  Diagnosis Date Noted   Osteopenia 09/03/2021   Seronegative rheumatoid arthritis (HCC) 03/12/2021   Hyperlipidemia 03/11/2021   Fatty liver 12/12/2019   Other abnormal glucose (prediabetes) 05/23/2019   Osteoarthritis of both knees 03/20/2015   OSA on CPAP 08/15/2014   Other chronic pain 01/31/2014   Depression, major, recurrent, in partial remission (HCC) 01/31/2014   Morbid obesity (HCC) - BMI 30+ with OSA 01/31/2014   Essential hypertension 08/09/2013   Vitamin D  deficiency 08/09/2013    Screening Tests Immunization History  Administered Date(s) Administered   DT (Pediatric) 07/05/2015   Influenza Inj Mdck Quad With Preservative 05/28/2017, 04/13/2018   Influenza Split 04/06/2012, 04/05/2013, 05/16/2014, 03/21/2015   Influenza,inj,Quad PF,6+ Mos 03/16/2019   Influenza,inj,quad, With Preservative 04/29/2016   PFIZER(Purple Top)SARS-COV-2 Vaccination 11/08/2019, 11/29/2019, 08/13/2020   Pneumococcal Conjugate-13 05/16/2014   Pneumococcal-Unspecified 06/22/1998   Td 06/22/2004   Zoster, Live 10/21/2015   Patient Care Team: Tonita Fallow, MD as PCP - General (Internal Medicine) Yvone Rush, MD as Consulting Physician (Orthopedic Surgery) Teressa Toribio SQUIBB, MD (Inactive) as Attending Physician (Gastroenterology) Blinda Ferry, MD as Consulting Physician (Psychiatry) Cleotilde Sewer, OD (Optometry) Joshua Pao, NP as Consulting Physician (Nurse Practitioner)  SURGICAL HISTORY She  has a past surgical history that includes Abdominal hysterectomy (2002); Joint replacement; Total knee arthroplasty (Right, 04/14/2013); Colonoscopy (2009); Total knee arthroplasty (Left, 01/06/2019); and Knee  Arthroplasty. FAMILY HISTORY Her family history includes COPD in her father; CVA (age of onset: 57) in her maternal grandmother; Cancer in her father; Deep vein thrombosis in her maternal grandfather; Heart attack in her mother; Heart attack (age of onset: 18) in her maternal grandfather; Hypertension in her father and mother. SOCIAL HISTORY She  reports that she has never smoked. She has been exposed to tobacco smoke. She has never used smokeless tobacco. She reports that she does not drink alcohol and does not use drugs.  Review of Systems  Constitutional:  Negative for malaise/fatigue and weight loss.  HENT:  Negative for congestion, hearing loss, sore throat and tinnitus.   Eyes:  Negative for blurred vision and double vision.  Respiratory:  Negative for cough, sputum production, shortness of breath and wheezing.   Cardiovascular:  Negative for chest pain, palpitations, orthopnea, claudication, leg swelling and PND.  Gastrointestinal:  Negative for abdominal pain, blood in stool, constipation, diarrhea, heartburn, melena, nausea and vomiting.  Genitourinary: Negative.   Musculoskeletal:  Positive for joint pain (R foot, bil hands). Negative for falls and myalgias.  Skin:  Negative for rash.  Neurological:  Negative for dizziness, tingling, sensory change, weakness and headaches.  Endo/Heme/Allergies:  Negative for environmental allergies and polydipsia.  Psychiatric/Behavioral: Negative.  Negative for depression, memory loss, substance abuse and suicidal ideas. The patient is not nervous/anxious and does not have insomnia.   All other systems reviewed and are negative.    Objective:     Today's Vitals   06/30/23 1038  BP: 124/80  Pulse: 85  Temp: 97.9 F (36.6 C)  SpO2: 98%  Weight: 198 lb 3.2 oz (89.9 kg)  Height: 5' 4 (1.626 m)   Body mass index is 34.02 kg/m.  General appearance: alert, no distress, WD/WN, obese female HEENT: normocephalic, sclerae anicteric, TMs  pearly, nares patent, no discharge or erythema, pharynx normal Oral cavity: MMM, no lesions Neck: supple, no lymphadenopathy, no thyromegaly, no masses Heart: RRR, normal S1, S2, no murmurs Lungs: CTA bilaterally, no wheezes, rhonchi, or rales Abdomen: +bs, soft, obese non tender, non distended, no masses, no hepatomegaly, no  splenomegaly Musculoskeletal: Mild bony enlargement with swelling bil hand DIPs with mild swelling, otherwise no obvious deformity, no swelling, non-antalgic gait.  Extremities: no edema, no cyanosis, no clubbing Pulses: 2+ symmetric, upper and lower extremities, normal cap refill Neurological: alert, oriented x 3, CN2-12 intact, strength normal upper extremities and lower extremities, sensation normal throughout, DTRs 2+ throughout, no cerebellar signs, gait antalgit Psychiatric: normal affect, behavior normal, pleasant, somewhat anxious   BAS[facility] NECESS[employer], NP   06/30/2023

## 2023-07-01 LAB — CBC WITH DIFFERENTIAL/PLATELET
Absolute Lymphocytes: 1752 {cells}/uL (ref 850–3900)
Absolute Monocytes: 523 {cells}/uL (ref 200–950)
Basophils Absolute: 38 {cells}/uL (ref 0–200)
Basophils Relative: 0.8 %
Eosinophils Absolute: 173 {cells}/uL (ref 15–500)
Eosinophils Relative: 3.6 %
HCT: 41.6 % (ref 35.0–45.0)
Hemoglobin: 13.7 g/dL (ref 11.7–15.5)
MCH: 30.2 pg (ref 27.0–33.0)
MCHC: 32.9 g/dL (ref 32.0–36.0)
MCV: 91.8 fL (ref 80.0–100.0)
MPV: 10.9 fL (ref 7.5–12.5)
Monocytes Relative: 10.9 %
Neutro Abs: 2314 {cells}/uL (ref 1500–7800)
Neutrophils Relative %: 48.2 %
Platelets: 286 10*3/uL (ref 140–400)
RBC: 4.53 10*6/uL (ref 3.80–5.10)
RDW: 12.7 % (ref 11.0–15.0)
Total Lymphocyte: 36.5 %
WBC: 4.8 10*3/uL (ref 3.8–10.8)

## 2023-07-01 LAB — COMPLETE METABOLIC PANEL WITH GFR
AG Ratio: 1.8 (calc) (ref 1.0–2.5)
ALT: 34 U/L — ABNORMAL HIGH (ref 6–29)
AST: 24 U/L (ref 10–35)
Albumin: 4.4 g/dL (ref 3.6–5.1)
Alkaline phosphatase (APISO): 94 U/L (ref 37–153)
BUN: 18 mg/dL (ref 7–25)
CO2: 29 mmol/L (ref 20–32)
Calcium: 10.2 mg/dL (ref 8.6–10.4)
Chloride: 103 mmol/L (ref 98–110)
Creat: 0.65 mg/dL (ref 0.50–1.05)
Globulin: 2.4 g/dL (ref 1.9–3.7)
Glucose, Bld: 112 mg/dL — ABNORMAL HIGH (ref 65–99)
Potassium: 4.7 mmol/L (ref 3.5–5.3)
Sodium: 141 mmol/L (ref 135–146)
Total Bilirubin: 0.5 mg/dL (ref 0.2–1.2)
Total Protein: 6.8 g/dL (ref 6.1–8.1)
eGFR: 97 mL/min/{1.73_m2} (ref >=60)

## 2023-07-01 LAB — LIPID PANEL
Cholesterol: 118 mg/dL (ref <200)
HDL: 50 mg/dL (ref >=50)
LDL Cholesterol (Calc): 46 mg/dL
Non-HDL Cholesterol (Calc): 68 mg/dL (ref <130)
Total CHOL/HDL Ratio: 2.4 (calc) (ref <5.0)
Triglycerides: 136 mg/dL (ref <150)

## 2023-07-01 LAB — HEMOGLOBIN A1C
Hgb A1c MFr Bld: 6.1 %{Hb} — ABNORMAL HIGH (ref <5.7)
Mean Plasma Glucose: 128 mg/dL
eAG (mmol/L): 7.1 mmol/L

## 2023-07-14 ENCOUNTER — Other Ambulatory Visit: Payer: Self-pay | Admitting: Nurse Practitioner

## 2023-08-17 ENCOUNTER — Other Ambulatory Visit: Payer: Self-pay | Admitting: Nurse Practitioner

## 2023-08-17 DIAGNOSIS — Z1231 Encounter for screening mammogram for malignant neoplasm of breast: Secondary | ICD-10-CM

## 2023-08-25 DIAGNOSIS — F3132 Bipolar disorder, current episode depressed, moderate: Secondary | ICD-10-CM | POA: Diagnosis not present

## 2023-09-07 DIAGNOSIS — G4733 Obstructive sleep apnea (adult) (pediatric): Secondary | ICD-10-CM | POA: Diagnosis not present

## 2023-09-10 NOTE — Progress Notes (Signed)
 Office Visit Note  Patient: [name]             Date of Birth: October 09, 1955           MRN: [id number]             PCP: Eden Emms, NP Referring: Lucky Cowboy, MD Visit Date: 09/22/2023 Occupation: @GUAROCC @  Subjective:  Right foot pain  History of Present Illness: [name] is a 68 y.o. female with seronegative rheumatoid arthritis and osteoarthritis.  She returns today after last visit in October 2024.  She states for the last 6 months she has been having pain in her right foot.  She states the pain is progressively getting worse.  She had cortisone injections by Dr. Luiz Blare last year which were helpful.  The pain has recurred now.  None of the other joints are painful.  She continues to be on Plaquenil 200 mg p.o. twice daily Monday to Friday without any interruption.    Activities of Daily Living:  Patient reports morning stiffness for all day.  Patient Reports nocturnal pain.  Difficulty dressing/grooming: Denies Difficulty climbing stairs: Reports Difficulty getting out of chair: Reports Difficulty using hands for taps, buttons, cutlery, and/or writing: Denies  Review of Systems  Constitutional:  Positive for fatigue.  HENT:  Negative for mouth sores and mouth dryness.   Eyes:  Positive for dryness.  Respiratory:  Negative for difficulty breathing.   Cardiovascular:  Negative for chest pain and palpitations.  Gastrointestinal:  Negative for blood in stool, constipation and diarrhea.  Endocrine: Negative for increased urination.  Genitourinary:  Positive for decreased urine output. Negative for involuntary urination.  Musculoskeletal:  Positive for joint pain, gait problem, joint pain, joint swelling and morning stiffness. Negative for myalgias, muscle weakness, muscle tenderness and myalgias.  Skin:  Negative for color change, rash, hair loss and sensitivity to sunlight.  Allergic/Immunologic: Negative for susceptible to infections.  Neurological:   Positive for headaches. Negative for dizziness.  Hematological:  Negative for swollen glands.  Psychiatric/Behavioral:  Positive for sleep disturbance. Negative for depressed mood. The patient is nervous/anxious.     PMFS History:  Patient Active Problem List   Diagnosis Date Noted   Encounter to establish care 09/20/2023   Osteopenia 09/03/2021   Seronegative rheumatoid arthritis (HCC) 03/12/2021   Hyperlipidemia 03/11/2021   Fatty liver 12/12/2019   Other abnormal glucose (prediabetes) 05/23/2019   Osteoarthritis of both knees 03/20/2015   OSA on CPAP 08/15/2014   Other chronic pain 01/31/2014   Depression, major, recurrent, in partial remission (HCC) 01/31/2014   Morbid obesity (HCC) - BMI 30+ with OSA 01/31/2014   Essential hypertension 08/09/2013   Vitamin D deficiency 08/09/2013    Past Medical History:  Diagnosis Date   Allergy    medicine and latex   Anxiety    Arthritis    Fibromyalgia    Hypertension    S/P total knee arthroplasty, left 01/07/2019   Simple renal cyst 12/12/2019   Incidentally noted on abd [place] 11/2019 - radiologist recommended further characterization with MRI w & w/o contrast   Hypoechoic lesions within the kidney which may simply represent mildly complicated cysts. The largest of these lies in the midportion of the kidney with increased echogenicity within and suggestion of internal color flow which may be related to septation. MRI of the kidneys with and w   Sleep apnea    Status post total shoulder arthroplasty, left 01/07/2019   Vitamin D deficiency  Family History  Problem Relation Age of Onset   Hypertension Mother    Heart attack Mother    Arthritis Mother    Hypertension Father    COPD Father    Thyroid cancer Father    Cancer Father    CVA Maternal Grandmother 37   Stroke Maternal Grandmother    Heart attack Maternal Grandfather 59   Deep vein thrombosis Maternal Grandfather    Arthritis Maternal Grandfather    Arthritis Maternal  Aunt    Colon cancer Neg Hx    Esophageal cancer Neg Hx    Stomach cancer Neg Hx    Breast cancer Neg Hx    BRCA 1/2 Neg Hx    Past Surgical History:  Procedure Laterality Date   ABDOMINAL HYSTERECTOMY  2002   Dr. Chari Manning    COLONOSCOPY  2009   JOINT REPLACEMENT  05-18-13 and 01-06-19   arthroscopy   KNEE ARTHROPLASTY     TOTAL KNEE ARTHROPLASTY Right 04/14/2013   Procedure: RIGHT TOTAL KNEE ARTHROPLASTY AND LEFT KNEE INJECTION ;  Surgeon: Harvie Junior, MD;  Location: [facility] OR;  Service: Orthopedics;  Laterality: Right;   TOTAL KNEE ARTHROPLASTY Left 01/06/2019   Procedure: TOTAL KNEE ARTHROPLASTY;  Surgeon: Jodi Geralds, MD;  Location: [facility];  Service: Orthopedics;  Laterality: Left;   Social History   Social History Narrative   Retired. She was a Geologist, engineering      [name] [phone])   [name] (37)   Immunization History  Administered Date(s) Administered   DT (Pediatric) 07/05/2015   Fluad Quad(high Dose 65+) 04/08/2022   Influenza Inj Mdck Quad With Preservative 05/28/2017, 04/13/2018   Influenza Split 04/06/2012, 04/05/2013, 05/16/2014, 03/21/2015   Influenza,inj,Quad PF,6+ Mos 03/16/2019   Influenza,inj,quad, With Preservative 04/29/2016   PFIZER(Purple Top)SARS-COV-2 Vaccination 11/08/2019, 11/29/2019, 07/24/2020, 08/13/2020   Pneumococcal Conjugate-13 05/16/2014   Pneumococcal-Unspecified 06/22/1998   Td 06/22/2004   Zoster Recombinant(Shingrix) 10/16/2016   Zoster, Live 10/21/2015, 11/21/2015     Objective: Vital Signs: BP 132/76 (BP Location: Left Arm, Patient Position: Sitting, Cuff Size: Large)   Pulse 90   Resp 16   Ht 5\' 4"  (1.626 m)   Wt 198 lb 6.4 oz (90 kg)   BMI 34.06 kg/m    Physical Exam Vitals and nursing note reviewed.  Constitutional:      Appearance: She is well-developed.  HENT:     Head: Normocephalic and atraumatic.  Eyes:     Conjunctiva/sclera: Conjunctivae normal.  Cardiovascular:     Rate and Rhythm: Normal rate and regular rhythm.      Heart sounds: Normal heart sounds.  Pulmonary:     Effort: Pulmonary effort is normal.     Breath sounds: Normal breath sounds.  Abdominal:     General: Bowel sounds are normal.     Palpations: Abdomen is soft.  Musculoskeletal:     Cervical back: Normal range of motion.  Lymphadenopathy:     Cervical: No cervical adenopathy.  Skin:    General: Skin is warm and dry.     Capillary Refill: Capillary refill takes less than 2 seconds.  Neurological:     Mental Status: She is alert and oriented to person, place, and time.  Psychiatric:        Behavior: Behavior normal.      Musculoskeletal Exam: Cervical spine had some limitation with lateral rotation.  Shoulder joints, elbow joints, wrist joints, MCPs PIPs and DIPs with good range of motion.  She had bilateral PIP and  DIP thickening.  Hip joints with good range of motion.  Knee joints were replaced and were in good range of motion.  She had bilateral dorsal spurring with tenderness and swelling over the right intertarsal region.  There was no tenderness over MTPs.  CDAI Exam: CDAI Score: 4  Patient Global: 20 / 100; Provider Global: 20 / 100 Swollen: 1 ; Tender: 2  Joint Exam 09/22/2023      Right  Left  Tarsometatarsal  Swollen Tender   Tender     Investigation: No additional findings.  Imaging: MM 3D SCREENING MAMMOGRAM BILATERAL BREAST Result Date: 09/17/2023 CLINICAL DATA:  Screening. EXAM: DIGITAL SCREENING BILATERAL MAMMOGRAM WITH TOMOSYNTHESIS AND CAD TECHNIQUE: Bilateral screening digital craniocaudal and mediolateral oblique mammograms were obtained. Bilateral screening digital breast tomosynthesis was performed. The images were evaluated with computer-aided detection. COMPARISON:  Previous exam(s). ACR Breast Density Category b: There are scattered areas of fibroglandular density. FINDINGS: There are no findings suspicious for malignancy. IMPRESSION: No mammographic evidence of malignancy. A result letter of this  screening mammogram will be mailed directly to the patient. RECOMMENDATION: Screening mammogram in one year. (Code:SM-B-01Y) BI-RADS CATEGORY  1: Negative. Electronically Signed   By: Baird Lyons M.D.   On: 09/17/2023 14:04    Recent Labs: Lab Results  Component Value Date   WBC 4.8 06/30/2023   HGB 13.7 06/30/2023   PLT 286 06/30/2023   NA 141 06/30/2023   K 4.7 06/30/2023   CL 103 06/30/2023   CO2 29 06/30/2023   GLUCOSE 112 (H) 06/30/2023   BUN 18 06/30/2023   CREATININE 0.65 06/30/2023   BILITOT 0.5 06/30/2023   ALKPHOS 101 02/08/2023   AST 24 06/30/2023   ALT 34 (H) 06/30/2023   PROT 6.8 06/30/2023   ALBUMIN 4.6 02/08/2023   CALCIUM 10.2 06/30/2023   GFRAA 103 09/02/2020    Speciality Comments: PLQ Eye Exam: 11/13/2022 WNL @ [facility]  PLQ eye exam scheduled for 11/13/2022  Procedures:  Medium Joint Inj: R ankle (intertarsal joint) on 09/22/2023 2:40 PM Indications: pain and joint swelling Details: 27 G 1.5 in needle, ultrasound-guided dorsal approach Medications: 1 mL lidocaine 1 %; 30 mg triamcinolone acetonide 40 MG/ML Aspirate: 0 mL Procedure, treatment alternatives, risks and benefits explained, specific risks discussed. Consent was given by the patient. Immediately prior to procedure a time out was called to verify the correct patient, procedure, equipment, support staff and site/side marked as required. Patient was prepped and draped in the usual sterile fashion.     Allergies: Latex, Iodinated contrast media, Celecoxib, Cephalexin, Codeine, Erythromycin, Sulfonamide derivatives, Trazodone and nefazodone, and Venlafaxine   Assessment / Plan:     Visit Diagnoses: Rheumatoid arthritis of multiple sites with negative rheumatoid factor (HCC) - RF-, +anti-CCP: -Patient denies having a flare of her rheumatoid arthritis.  She has been having pain and discomfort over the right intertarsal region.  Swelling was noted in the right intertarsal region.  None of the joints  are swollen.  She has been on Plaquenil without any interruption.  She requested refill on Plaquenil.  Plan: hydroxychloroquine (PLAQUENIL) 200 MG tablet  High risk medication use - Plaquenil 200 mg 1 tablet by mouth twice daily Monday to Friday.PLQ Eye Exam: 11/13/2022.  Patient is scheduled to have repeat eye examination in May 2025.  Labs from January were reviewed which are within normal limits except for mild elevation of LFTs.  Information immunization was placed in the AVS.  Positive anti-CCP test - Anti-CCP 39 (weak  positive), RF negative, ESR 6.  Primary osteoarthritis of both hands-she had bilateral PIP and DIP thickening with no synovitis.  Status post total bilateral knee replacement-she had good range of motion without discomfort.  Pain in right foot -she has been having progressively increased pain in her right foot.  Right intertarsal tenderness and swelling was noted.  Per patient's request after informed consent was obtained and side effects (risk of infection, nerve and tendon injury, dermal atrophy, discoloration of the skin) were discussed right intertarsal joint was injected with lidocaine and Kenalog as described above.  Patient tolerated the procedure well.  Postprocedure instructions were given.  Plan: US Guided Needle Placement  Primary osteoarthritis of both feet-she has chronic pain and discomfort in her feet.  Fibromyalgia-she can history of generalized pain and discomfort from fibromyalgia.  Need for regular exercise was emphasized.  Other medical problems listed as follows:  History of prediabetes  Mixed hyperlipidemia  Essential hypertension  Depression, major, recurrent, in partial remission (HCC)  Vitamin D deficiency  OSA on CPAP  Orders: Orders Placed This Encounter  Procedures   Medium Joint Inj: R ankle   US Guided Needle Placement   Meds ordered this encounter  Medications   hydroxychloroquine (PLAQUENIL) 200 MG tablet    Sig: TAKE 1 TABLET  BY MOUTH TWICE DAILY( EVERY 12 HOURS) MONDAY TO FRIDAY    Dispense:  120 tablet    Refill:  0     Follow-Up Instructions: Return in about 5 months (around 02/22/2024) for Rheumatoid arthritis, Osteoarthritis.   Pollyann Savoy, MD  Note - This record has been created using Animal nutritionist.  Chart creation errors have been sought, but may not always  have been located. Such creation errors do not reflect on  the standard of medical care.

## 2023-09-16 ENCOUNTER — Ambulatory Visit
Admission: RE | Admit: 2023-09-16 | Discharge: 2023-09-16 | Disposition: A | Discharge status: Home / Self Care | Payer: Medicare PPO | Source: Ambulatory Visit | Attending: Internal Medicine | Admitting: Internal Medicine

## 2023-09-16 DIAGNOSIS — Z1231 Encounter for screening mammogram for malignant neoplasm of breast: Secondary | ICD-10-CM

## 2023-09-20 ENCOUNTER — Encounter: Payer: Self-pay | Admitting: Nurse Practitioner

## 2023-09-20 ENCOUNTER — Ambulatory Visit (INDEPENDENT_AMBULATORY_CARE_PROVIDER_SITE_OTHER): Payer: PPO | Admitting: Nurse Practitioner

## 2023-09-20 VITALS — BP 136/88 | HR 61 | Temp 97.6°F | Ht 64.0 in | Wt 198.6 lb

## 2023-09-20 DIAGNOSIS — G4733 Obstructive sleep apnea (adult) (pediatric): Secondary | ICD-10-CM | POA: Diagnosis not present

## 2023-09-20 DIAGNOSIS — M8580 Other specified disorders of bone density and structure, unspecified site: Secondary | ICD-10-CM | POA: Diagnosis not present

## 2023-09-20 DIAGNOSIS — I10 Essential (primary) hypertension: Secondary | ICD-10-CM | POA: Diagnosis not present

## 2023-09-20 DIAGNOSIS — Z7689 Persons encountering health services in other specified circumstances: Secondary | ICD-10-CM | POA: Insufficient documentation

## 2023-09-20 DIAGNOSIS — F3341 Major depressive disorder, recurrent, in partial remission: Secondary | ICD-10-CM

## 2023-09-20 DIAGNOSIS — Z0000 Encounter for general adult medical examination without abnormal findings: Secondary | ICD-10-CM | POA: Insufficient documentation

## 2023-09-20 DIAGNOSIS — E785 Hyperlipidemia, unspecified: Secondary | ICD-10-CM | POA: Diagnosis not present

## 2023-09-20 DIAGNOSIS — M0600 Rheumatoid arthritis without rheumatoid factor, unspecified site: Secondary | ICD-10-CM

## 2023-09-20 NOTE — Assessment & Plan Note (Signed)
 Patient currently maintained on hydrochlorothiazide 25 mg daily.  Blood pressure well-controlled.  Patient is tolerating medication well continue medication as prescribed

## 2023-09-20 NOTE — Assessment & Plan Note (Signed)
 Continue the CPAP as directed.  Patient is followed by Dr. Vickey Huger from [facility].  Continue doing as prescribed and follow-up with specialist as recommended

## 2023-09-20 NOTE — Assessment & Plan Note (Signed)
 History of same.  Patient is overdue for DEXA scan

## 2023-09-20 NOTE — Assessment & Plan Note (Addendum)
 Patient is followed by rheumatology currently on Plaquenil, gingerroot, and meloxicam.  Continue taking medication as prescribed continue following with specialist as recommended

## 2023-09-20 NOTE — Progress Notes (Signed)
 New Patient Office Visit  Subjective    Patient ID: [name], female    DOB: 03-Jun-1956  Age: 68 y.o. MRN: [id number]  CC:  Chief Complaint  Patient presents with   Establish Care    Pt complains of drainage in both ears.     HPI [name] presents to establish care   HTN: hydrochlorothiazide 25mg . Does check it at home but infrequently. Tolerates the medication well    OSA: Sees [facility] , Dr. Lesleigh Noe who manages her CPAP    RA: followed by rheumatology and current on plaquenil and meloxicam and ginger root.  States that she does take her meloxicam daily and it does hlep   Osteopenia: last dexa scan was 2-3 years ago  HLD crestor 20 mg daily   MDD: lamictal, seroquel, and klonipin followed by karen jones. Sees them every 6 months sometime 3 times a year    GERD: on nexium 40mg  daily but does not take it daily. Feels like the medication is effective    TDAP:: needs updtaing at local pharmacy  NWG:NFAOZHY  Covid: original with boosters  Pna: get the prevnar 20  Shingles: got the first one but did not finish the seeonc   Colonoscopy: 12/01/2017, recall in 10 years Mammogram: 09/16/2023 Pap smear: hysterectomy  Dexa: needs updating   Headaches: states that they are often. She will take tylneol and excedrin migraine. She has had them for years. Most the time in the front and descried as a dull ache.  Unsure she has an aura. But she can tell sometimes she will get one.  States when they started she was sent for an MRI  Ears: states that for the past year. They will have drainage that is clear. States that it was water in the ear this morning. States that she has cleaned with peroxide.she does take allergra daily.   Outpatient Encounter Medications as of 09/20/2023  Medication Sig   acetaminophen (TYLENOL) 500 MG tablet Take 500 mg by mouth every 6 (six) hours as needed.   acyclovir (ZOVIRAX) 400 MG tablet Take 400 mg by mouth as needed.   aspirin EC 81 MG  tablet Take 81 mg by mouth daily.   Cholecalciferol (VITAMIN D PO) Take 8,000 Int'l Units by mouth daily. 2000 units per capsule   CINNAMON PO Take 2,000 mg by mouth daily.   clonazePAM (KLONOPIN) 1 MG tablet Take 1 mg by mouth at bedtime as needed for anxiety.    diclofenac Sodium (VOLTAREN) 1 % GEL Apply topically as needed.   esomeprazole (NEXIUM) 40 MG capsule TAKE 1 CAPSULE BY MOUTH DAILY FOR STOMACH ACID OR REFLUX   fexofenadine (ALLEGRA ALLERGY) 180 MG tablet    glucose blood (ACCU-CHEK AVIVA PLUS) test strip CHECK BLOOD SUGAR 1 TIME DAILY. [id number]   hydrochlorothiazide (HYDRODIURIL) 25 MG tablet TAKE 1 TABLET BY MOUTH DAILY FOR BLOOD PRESSURE AND FLUID RETENTION OR ANKLE SWELLING   hydroxychloroquine (PLAQUENIL) 200 MG tablet TAKE 1 TABLET BY MOUTH TWICE DAILY( EVERY 12 HOURS) MONDAY TO FRIDAY   lamoTRIgine (LAMICTAL) 150 MG tablet Take 1 & 1/2 tablets at bedtime (Patient taking differently: once. Take 1 tablet at bedtime)   Lysine 500 MG TABS Take 1,000 mg by mouth daily. Currently taking 2 pill QD   Magnesium 500 MG TABS Take 2,000 mg by mouth daily. Takes 4 tablets daily   meloxicam (MOBIC) 15 MG tablet Take  1/2 to 1 tablet  Daily  with Food  for Pain &  Inflammation   Multiple Vitamin (MULTIVITAMIN WITH MINERALS) TABS tablet Take 1 tablet by mouth daily.   ondansetron (ZOFRAN) 4 MG tablet Take 4 mg by mouth daily as needed.   QUEtiapine (SEROQUEL) 100 MG tablet Take 100 mg by mouth 3 (three) times daily.   rosuvastatin (CRESTOR) 40 MG tablet TAKE 1/2 TABLET BY MOUTH DAILY FOR CHOLESTEROL   cetirizine (ZYRTEC) 10 MG tablet Take 10 mg by mouth daily. Switches with Allegra.   diazepam (VALIUM) 2 MG tablet Take 1 tablet (2 mg total) by mouth every 6 (six) hours as needed (Muscle Spasm). (Patient not taking: Reported on 09/20/2023)   Ibuprofen (ADVIL PO) Take by mouth as needed. (Patient not taking: Reported on 09/20/2023)   ipratropium (ATROVENT) 0.03 % nasal spray Place 2 sprays into  the nose 3 (three) times daily. (Patient not taking: Reported on 09/20/2023)   Lancets (ACCU-CHEK MULTICLIX) lancets Use to check blood glucose daily   vitamin C (ASCORBIC ACID) 500 MG tablet Take 500 mg by mouth 2 (two) times daily.   zinc gluconate 50 MG tablet Take 50 mg by mouth daily.   [DISCONTINUED] benzonatate (TESSALON PERLES) 100 MG capsule Take 1 capsule (100 mg total) by mouth every 6 (six) hours as needed for cough.   [DISCONTINUED] Chlorphen-Phenyleph-ASA (COLD RELIEF PLUS PO) Take by mouth.   [DISCONTINUED] Dextromethorphan-guaiFENesin (SAFE TUSSIN 30 PO) Take by mouth.   [DISCONTINUED] montelukast (SINGULAIR) 10 MG tablet Take 1 tablet (10 mg total) by mouth daily.   [DISCONTINUED] promethazine-dextromethorphan (PROMETHAZINE-DM) 6.25-15 MG/5ML syrup Take 5 mLs by mouth 4 (four) times daily as needed for cough.   No facility-administered encounter medications on file as of 09/20/2023.    Past Medical History:  Diagnosis Date   Allergy    medicine and latex   Anxiety    Arthritis    Fibromyalgia    Hypertension    S/P total knee arthroplasty, left 01/07/2019   Simple renal cyst 12/12/2019   Incidentally noted on abd [place] 11/2019 - radiologist recommended further characterization with MRI w & w/o contrast   Hypoechoic lesions within the kidney which may simply represent mildly complicated cysts. The largest of these lies in the midportion of the kidney with increased echogenicity within and suggestion of internal color flow which may be related to septation. MRI of the kidneys with and w   Sleep apnea    Status post total shoulder arthroplasty, left 01/07/2019   Vitamin D deficiency     Past Surgical History:  Procedure Laterality Date   ABDOMINAL HYSTERECTOMY  2002   Dr. Chari Manning    COLONOSCOPY  2009   JOINT REPLACEMENT  05-18-13 and 01-06-19   arthroscopy   KNEE ARTHROPLASTY     TOTAL KNEE ARTHROPLASTY Right 04/14/2013   Procedure: RIGHT TOTAL KNEE ARTHROPLASTY AND LEFT  KNEE INJECTION ;  Surgeon: Harvie Junior, MD;  Location: [facility] OR;  Service: Orthopedics;  Laterality: Right;   TOTAL KNEE ARTHROPLASTY Left 01/06/2019   Procedure: TOTAL KNEE ARTHROPLASTY;  Surgeon: Jodi Geralds, MD;  Location: [facility];  Service: Orthopedics;  Laterality: Left;    Family History  Problem Relation Age of Onset   Hypertension Mother    Heart attack Mother    Arthritis Mother    Hypertension Father    COPD Father    Thyroid cancer Father    Cancer Father    CVA Maternal Grandmother 44   Stroke Maternal Grandmother    Heart attack Maternal Grandfather 59   Deep vein  thrombosis Maternal Grandfather    Arthritis Maternal Grandfather    Arthritis Maternal Aunt    Colon cancer Neg Hx    Esophageal cancer Neg Hx    Stomach cancer Neg Hx    Breast cancer Neg Hx    BRCA 1/2 Neg Hx     Social History   Socioeconomic History   Marital status: Married    Spouse name: Brynda Greathouse   Number of children: 2   Years of education: Not on file   Highest education level: Some college, no degree  Occupational History   Not on file  Tobacco Use   Smoking status: Never    Passive exposure: Past   Smokeless tobacco: Never  Vaping Use   Vaping status: Never Used  Substance and Sexual Activity   Alcohol use: No   Drug use: No   Sexual activity: Not on file  Other Topics Concern   Not on file  Social History Narrative   Retired. She was a Geologist, engineering      [name] [phone])   [name] (37)   Social Drivers of Corporate investment banker Strain: Low Risk  (09/18/2023)   Overall Financial Resource Strain (CARDIA)    Difficulty of Paying Living Expenses: Not hard at all  Food Insecurity: No Food Insecurity (09/18/2023)   Hunger Vital Sign    Worried About Running Out of Food in the Last Year: Never true    Ran Out of Food in the Last Year: Never true  Transportation Needs: No Transportation Needs (09/18/2023)   PRAPARE - Administrator, Civil Service (Medical): No     Lack of Transportation (Non-Medical): No  Physical Activity: Unknown (09/18/2023)   Exercise Vital Sign    Days of Exercise per Week: 0 days    Minutes of Exercise per Session: Not on file  Stress: Stress Concern Present (09/18/2023)   [employer] of Occupational Health - Occupational Stress Questionnaire    Feeling of Stress : To some extent  Social Connections: Socially Integrated (09/18/2023)   Social Connection and Isolation Panel [NHANES]    Frequency of Communication with Friends and Family: More than three times a week    Frequency of Social Gatherings with Friends and Family: Three times a week    Attends Religious Services: More than 4 times per year    Active Member of Clubs or Organizations: Yes    Attends Engineer, structural: More than 4 times per year    Marital Status: Married  Catering manager Violence: Not on file    Review of Systems  Constitutional:  Negative for chills and fever.  HENT:  Positive for ear discharge. Negative for ear pain.   Respiratory:  Negative for shortness of breath.   Cardiovascular:  Negative for chest pain and leg swelling.  Gastrointestinal:  Negative for abdominal pain, blood in stool, constipation, diarrhea, nausea and vomiting.       Bm daily   Genitourinary:  Negative for dysuria and hematuria.  Neurological:  Positive for headaches. Negative for tingling.  Psychiatric/Behavioral:  Negative for hallucinations and suicidal ideas.         Objective    BP 136/88   Pulse 61   Temp 97.6 F (36.4 C) (Oral)   Ht 5\' 4"  (1.626 m)   Wt 198 lb 9.6 oz (90.1 kg)   SpO2 96%   BMI 34.09 kg/m   Physical Exam Vitals and nursing note reviewed.  Constitutional:  Appearance: Normal appearance.  HENT:     Right Ear: Tympanic membrane, ear canal and external ear normal.     Left Ear: Tympanic membrane, ear canal and external ear normal.     Mouth/Throat:     Mouth: Mucous membranes are moist.     Pharynx: Oropharynx is  clear.  Eyes:     Extraocular Movements: Extraocular movements intact.     Pupils: Pupils are equal, round, and reactive to light.  Cardiovascular:     Rate and Rhythm: Normal rate and regular rhythm.     Pulses: Normal pulses.     Heart sounds: Normal heart sounds.  Pulmonary:     Effort: Pulmonary effort is normal.     Breath sounds: Normal breath sounds.  Musculoskeletal:     Right lower leg: No edema.     Left lower leg: No edema.  Lymphadenopathy:     Cervical: No cervical adenopathy.  Skin:    General: Skin is warm.  Neurological:     General: No focal deficit present.     Mental Status: She is alert.     Deep Tendon Reflexes:     Reflex Scores:      Bicep reflexes are 2+ on the right side and 2+ on the left side.      Patellar reflexes are 2+ on the right side and 2+ on the left side.    Comments: Bilateral upper and lower extremity strength 5/5  Psychiatric:        Mood and Affect: Mood normal.        Behavior: Behavior normal.        Thought Content: Thought content normal.        Judgment: Judgment normal.         Assessment & Plan:   Problem List Items Addressed This Visit       Cardiovascular and Mediastinum   Essential hypertension   Patient currently maintained on hydrochlorothiazide 25 mg daily.  Blood pressure well-controlled.  Patient is tolerating medication well continue medication as prescribed        Respiratory   OSA on CPAP   Continue the CPAP as directed.  Patient is followed by Dr. Vickey Huger from [facility].  Continue doing as prescribed and follow-up with specialist as recommended        Musculoskeletal and Integument   Seronegative rheumatoid arthritis (HCC)   Patient is followed by rheumatology currently on Plaquenil, gingerroot, and meloxicam.  Continue taking medication as prescribed continue following with specialist as recommended      Osteopenia   History of same.  Patient is overdue for DEXA scan         Other   Depression, major, recurrent, in partial remission [facility])   Patient currently followed by Deatra Robinson.  Patient denies HI/SI/AVH.  Patient currently maintained on Seroquel, Lamictal, Lexapro, Klonopin.  Continue taking medications as prescribed and follow-up with specialist as recommended.      Hyperlipidemia   History of the same.  Patient currently maintained on rosuvastatin 20 mg daily.  Continue      Encounter to establish care - Primary   Was able to review most recent office note and labs.       Return in about 6 months (around 03/21/2024) for CPE and Labs.   Audria Nine, NP

## 2023-09-20 NOTE — Patient Instructions (Signed)
 Nice to meet you today We are out of the pneumonia vaccines Follow up with me in 6 months for your physical and labs  Take the meloxicam with food and take the Nexium too   Do the flonase (fluticasone) 2 sprays each nostril daily for the next week

## 2023-09-20 NOTE — Assessment & Plan Note (Signed)
 Was able to review most recent office note and labs.

## 2023-09-20 NOTE — Assessment & Plan Note (Signed)
 Patient currently followed by [name].  Patient denies HI/SI/AVH.  Patient currently maintained on Seroquel, Lamictal, Lexapro, Klonopin.  Continue taking medications as prescribed and follow-up with specialist as recommended.

## 2023-09-20 NOTE — Assessment & Plan Note (Signed)
 History of the same.  Patient currently maintained on rosuvastatin 20 mg daily.  Continue

## 2023-09-21 ENCOUNTER — Encounter: Payer: Self-pay | Admitting: Nurse Practitioner

## 2023-09-22 ENCOUNTER — Encounter: Payer: Self-pay | Admitting: Rheumatology

## 2023-09-22 ENCOUNTER — Ambulatory Visit (INDEPENDENT_AMBULATORY_CARE_PROVIDER_SITE_OTHER)

## 2023-09-22 ENCOUNTER — Ambulatory Visit: Payer: Medicare PPO | Attending: Rheumatology | Admitting: Rheumatology

## 2023-09-22 VITALS — BP 132/76 | HR 90 | Resp 16 | Ht 64.0 in | Wt 198.4 lb

## 2023-09-22 DIAGNOSIS — M19071 Primary osteoarthritis, right ankle and foot: Secondary | ICD-10-CM | POA: Diagnosis not present

## 2023-09-22 DIAGNOSIS — Z96653 Presence of artificial knee joint, bilateral: Secondary | ICD-10-CM | POA: Diagnosis not present

## 2023-09-22 DIAGNOSIS — Z79899 Other long term (current) drug therapy: Secondary | ICD-10-CM

## 2023-09-22 DIAGNOSIS — M19042 Primary osteoarthritis, left hand: Secondary | ICD-10-CM

## 2023-09-22 DIAGNOSIS — F3341 Major depressive disorder, recurrent, in partial remission: Secondary | ICD-10-CM

## 2023-09-22 DIAGNOSIS — M797 Fibromyalgia: Secondary | ICD-10-CM | POA: Diagnosis not present

## 2023-09-22 DIAGNOSIS — M19041 Primary osteoarthritis, right hand: Secondary | ICD-10-CM

## 2023-09-22 DIAGNOSIS — M0609 Rheumatoid arthritis without rheumatoid factor, multiple sites: Secondary | ICD-10-CM

## 2023-09-22 DIAGNOSIS — R102 Pelvic and perineal pain: Secondary | ICD-10-CM

## 2023-09-22 DIAGNOSIS — I10 Essential (primary) hypertension: Secondary | ICD-10-CM | POA: Diagnosis not present

## 2023-09-22 DIAGNOSIS — M79671 Pain in right foot: Secondary | ICD-10-CM

## 2023-09-22 DIAGNOSIS — R768 Other specified abnormal immunological findings in serum: Secondary | ICD-10-CM

## 2023-09-22 DIAGNOSIS — E559 Vitamin D deficiency, unspecified: Secondary | ICD-10-CM

## 2023-09-22 DIAGNOSIS — G4733 Obstructive sleep apnea (adult) (pediatric): Secondary | ICD-10-CM

## 2023-09-22 DIAGNOSIS — Z87898 Personal history of other specified conditions: Secondary | ICD-10-CM

## 2023-09-22 DIAGNOSIS — E782 Mixed hyperlipidemia: Secondary | ICD-10-CM | POA: Diagnosis not present

## 2023-09-22 DIAGNOSIS — M19072 Primary osteoarthritis, left ankle and foot: Secondary | ICD-10-CM

## 2023-09-22 MED ORDER — TRIAMCINOLONE ACETONIDE 40 MG/ML IJ SUSP
30.0000 mg | INTRAMUSCULAR | Status: AC | PRN
  Administered 2023-09-22: 30 mg via INTRA_ARTICULAR

## 2023-09-22 MED ORDER — HYDROXYCHLOROQUINE SULFATE 200 MG PO TABS: ORAL_TABLET | ORAL | 0 refills | Status: DC

## 2023-09-22 MED ORDER — LIDOCAINE HCL 1 % IJ SOLN
1.0000 mL | INTRAMUSCULAR | Status: AC | PRN
  Administered 2023-09-22: 1 mL

## 2023-09-22 NOTE — Patient Instructions (Signed)
Standing Labs We placed an order today for your standing lab work.   Please have your standing labs drawn in June  Please have your labs drawn 2 weeks prior to your appointment so that the provider can discuss your lab results at your appointment, if possible.  Please note that you may see your imaging and lab results in [place] before we have reviewed them. We will contact you once all results are reviewed. Please allow our office up to 72 hours to thoroughly review all of the results before contacting the office for clarification of your results.  WALK-IN LAB HOURS  Monday through Thursday from 8:00 am -12:30 pm and 1:00 pm-5:00 pm and Friday from 8:00 am-12:00 pm.  Patients with office visits requiring labs will be seen before walk-in labs.  You may encounter longer than normal wait times. Please allow additional time. Wait times may be shorter on  Monday and Thursday afternoons.  We do not book appointments for walk-in labs. We appreciate your patience and understanding with our staff.   Labs are drawn by [facility]. Please bring your co-pay at the time of your lab draw.  You may receive a bill from [facility] for your lab work.  Please note if you are on Hydroxychloroquine and and an order has been placed for a Hydroxychloroquine level,  you will need to have it drawn 4 hours or more after your last dose.  If you wish to have your labs drawn at another location, please call the office 24 hours in advance so we can fax the orders.  The office is located at [address], [place], [place], [place] [address]   If you have any questions regarding directions or hours of operation,  please call [phone].   As a reminder, please drink plenty of water prior to coming for your lab work. Thanks!   Vaccines You are taking a medication(s) that can suppress your immune system.  The following immunizations are recommended: Flu annually Covid-19  Td/Tdap (tetanus, diphtheria, pertussis) every  10 years Pneumonia (Prevnar 15 then Pneumovax 23 at least 1 year apart.  Alternatively, can take Prevnar 20 without needing additional dose) Shingrix: 2 doses from 4 weeks to 6 months apart  Please check with your PCP to make sure you are up to date.

## 2023-09-24 ENCOUNTER — Encounter: Payer: Self-pay | Admitting: Neurology

## 2023-09-30 ENCOUNTER — Encounter: Payer: 59 | Admitting: Internal Medicine

## 2023-10-08 DIAGNOSIS — G4733 Obstructive sleep apnea (adult) (pediatric): Secondary | ICD-10-CM | POA: Diagnosis not present

## 2023-10-11 DIAGNOSIS — G4733 Obstructive sleep apnea (adult) (pediatric): Secondary | ICD-10-CM | POA: Diagnosis not present

## 2023-11-07 DIAGNOSIS — G4733 Obstructive sleep apnea (adult) (pediatric): Secondary | ICD-10-CM | POA: Diagnosis not present

## 2023-11-10 ENCOUNTER — Telehealth: Payer: Self-pay

## 2023-11-10 MED ORDER — ESOMEPRAZOLE MAGNESIUM 40 MG PO CPDR: 40.0000 mg | DELAYED_RELEASE_CAPSULE | Freq: Every day | ORAL | 1 refills | Status: AC

## 2023-11-10 NOTE — Telephone Encounter (Signed)
 LAST APPOINTMENT DATE: 09/20/23   NEXT APPOINTMENT DATE: 12/02/2023 (annual wellness visit)    Nexium  40 MG   LAST REFILL: 07/14/2023  QTY: #90 1RF

## 2023-11-10 NOTE — Telephone Encounter (Signed)
 LAST APPOINTMENT DATE: 09/20/23   NEXT APPOINTMENT DATE: 12/02/2023 (annual wellness); 03/22/24 with pcp   Meloxicam  15 mg   LAST REFILL: 03/25/23  QTY: #90 3RF

## 2023-11-10 NOTE — Telephone Encounter (Signed)
Should have a refill at the pharmacy.

## 2023-11-11 ENCOUNTER — Other Ambulatory Visit: Payer: Self-pay | Admitting: Nurse Practitioner

## 2023-11-11 DIAGNOSIS — M76899 Other specified enthesopathies of unspecified lower limb, excluding foot: Secondary | ICD-10-CM

## 2023-11-11 NOTE — Telephone Encounter (Signed)
 Copied from CRM [id number]. Topic: Clinical - Medication Refill >> Nov 11, 2023 10:23 AM Freya Jesus wrote: Medication: meloxicam  (MOBIC ) 15 MG tablet [[id number]]  Has the patient contacted their pharmacy? Yes (Agent: If no, request that the patient contact the pharmacy for the refill. If patient does not wish to contact the pharmacy document the reason why and proceed with request.) (Agent: If yes, when and what did the pharmacy advise?) Need new prescription   This is the patient's preferred pharmacy:  [id number] - [place], [place] - [address] AT [facility] [address] [place] [address] Phone: [phone] Fax: [phone]  Is this the correct pharmacy for this prescription? Yes If no, delete pharmacy and type the correct one.   Has the prescription been filled recently? No  Is the patient out of the medication? Yes  Has the patient been seen for an appointment in the last year OR does the patient have an upcoming appointment? Yes  Can we respond through MyChart? Yes  Agent: Please be advised that Rx refills may take up to 3 business days. We ask that you follow-up with your pharmacy.

## 2023-11-12 MED ORDER — MELOXICAM 15 MG PO TABS: ORAL_TABLET | ORAL | 3 refills | Status: AC

## 2023-11-17 DIAGNOSIS — H524 Presbyopia: Secondary | ICD-10-CM | POA: Diagnosis not present

## 2023-11-17 DIAGNOSIS — H2513 Age-related nuclear cataract, bilateral: Secondary | ICD-10-CM | POA: Diagnosis not present

## 2023-11-17 DIAGNOSIS — Z79899 Other long term (current) drug therapy: Secondary | ICD-10-CM | POA: Diagnosis not present

## 2023-11-17 DIAGNOSIS — H04123 Dry eye syndrome of bilateral lacrimal glands: Secondary | ICD-10-CM | POA: Diagnosis not present

## 2023-11-23 ENCOUNTER — Other Ambulatory Visit: Payer: Self-pay | Admitting: *Deleted

## 2023-11-23 DIAGNOSIS — Z79899 Other long term (current) drug therapy: Secondary | ICD-10-CM | POA: Diagnosis not present

## 2023-11-23 LAB — COMPREHENSIVE METABOLIC PANEL WITH GFR
AG Ratio: 2.1 (calc) (ref 1.0–2.5)
ALT: 33 U/L — ABNORMAL HIGH (ref 6–29)
AST: 24 U/L (ref 10–35)
Albumin: 4.5 g/dL (ref 3.6–5.1)
Alkaline phosphatase (APISO): 104 U/L (ref 37–153)
BUN: 17 mg/dL (ref 7–25)
CO2: 29 mmol/L (ref 20–32)
Calcium: 9.3 mg/dL (ref 8.6–10.4)
Chloride: 103 mmol/L (ref 98–110)
Creat: 0.78 mg/dL (ref 0.50–1.05)
Globulin: 2.1 g/dL (ref 1.9–3.7)
Glucose, Bld: 103 mg/dL — ABNORMAL HIGH (ref 65–99)
Potassium: 4.1 mmol/L (ref 3.5–5.3)
Sodium: 141 mmol/L (ref 135–146)
Total Bilirubin: 0.4 mg/dL (ref 0.2–1.2)
Total Protein: 6.6 g/dL (ref 6.1–8.1)
eGFR: 84 mL/min/{1.73_m2} (ref >=60)

## 2023-11-23 LAB — CBC WITH DIFFERENTIAL/PLATELET
Absolute Lymphocytes: 1903 {cells}/uL (ref 850–3900)
Absolute Monocytes: 643 {cells}/uL (ref 200–950)
Basophils Absolute: 32 {cells}/uL (ref 0–200)
Basophils Relative: 0.5 %
Eosinophils Absolute: 252 {cells}/uL (ref 15–500)
Eosinophils Relative: 4 %
HCT: 42 % (ref 35.0–45.0)
Hemoglobin: 13.8 g/dL (ref 11.7–15.5)
MCH: 30.1 pg (ref 27.0–33.0)
MCHC: 32.9 g/dL (ref 32.0–36.0)
MCV: 91.7 fL (ref 80.0–100.0)
MPV: 10.7 fL (ref 7.5–12.5)
Monocytes Relative: 10.2 %
Neutro Abs: 3471 {cells}/uL (ref 1500–7800)
Neutrophils Relative %: 55.1 %
Platelets: 288 10*3/uL (ref 140–400)
RBC: 4.58 10*6/uL (ref 3.80–5.10)
RDW: 13.3 % (ref 11.0–15.0)
Total Lymphocyte: 30.2 %
WBC: 6.3 10*3/uL (ref 3.8–10.8)

## 2023-11-24 ENCOUNTER — Ambulatory Visit: Payer: Self-pay | Admitting: Rheumatology

## 2023-11-24 NOTE — Progress Notes (Signed)
 CBC is normal, CMP shows elevated ALT (liver function) which is stable.  Please forward results to her PCP.

## 2023-12-02 ENCOUNTER — Ambulatory Visit (INDEPENDENT_AMBULATORY_CARE_PROVIDER_SITE_OTHER)

## 2023-12-02 VITALS — BP 160/69 | Ht 64.0 in | Wt 199.0 lb

## 2023-12-02 DIAGNOSIS — Z0000 Encounter for general adult medical examination without abnormal findings: Secondary | ICD-10-CM

## 2023-12-02 NOTE — Progress Notes (Signed)
 Because this visit was a virtual/telehealth visit,  certain criteria was not obtained, such a blood pressure, CBG if applicable, and timed get up and go. Any medications not marked as taking were not mentioned during the medication reconciliation part of the visit. Any vitals not documented were not able to be obtained due to this being a telehealth visit or patient was unable to self-report a recent blood pressure reading due to a lack of equipment at home via telehealth. Vitals that have been documented are verbally provided by the patient.   This visit was performed by a medical professional under my direct supervision. I was immediately available for consultation/collaboration. I have reviewed and agree with the Annual Wellness Visit documentation.  Subjective:   [name] is a 68 y.o. who presents for a Medicare Wellness preventive visit.  As a reminder, Annual Wellness Visits don't include a physical exam, and some assessments may be limited, especially if this visit is performed virtually. We may recommend an in-person follow-up visit with your provider if needed.  Visit Complete: Virtual I connected with  [name] on 12/02/23 by a audio enabled telemedicine application and verified that I am speaking with the correct person using two identifiers.  Patient Location: Home  Provider Location: Home Office  I discussed the limitations of evaluation and management by telemedicine. The patient expressed understanding and agreed to proceed.  Vital Signs: Because this visit was a virtual/telehealth visit, some criteria may be missing or patient reported. Any vitals not documented were not able to be obtained and vitals that have been documented are patient reported.  VideoDeclined- This patient declined Librarian, academic. Therefore the visit was completed with audio only.  Persons Participating in Visit: Patient.  AWV Questionnaire: No: Patient  Medicare AWV questionnaire was not completed prior to this visit.  Cardiac Risk Factors include: advanced age (>74men, >82 women);hypertension;dyslipidemia     Objective:    Today's Vitals   12/02/23 1010 12/02/23 1315  BP:  (!) 160/69  Weight:  199 lb (90.3 kg)  Height:  5' 4 (1.626 m)  PainSc: 6     Body mass index is 34.16 kg/m.     12/02/2023    1:15 PM 02/16/2023    2:04 PM 02/08/2023    1:09 PM 03/22/2022   10:45 PM 03/12/2021    9:35 AM 11/29/2019   11:19 AM 01/09/2019    4:00 AM  Advanced Directives  Does Patient Have a Medical Advance Directive? Yes Yes No Yes No No No  Type of Estate agent of [place];Living will Healthcare Power of Attorney       Copy of Healthcare Power of Attorney in Chart? No - copy requested No - copy requested       Would patient like information on creating a medical advance directive?  No - Patient declined No - Patient declined  No - Patient declined No - Patient declined No - Patient declined      Data saved with a previous flowsheet row definition    Current Medications (verified) Outpatient Encounter Medications as of 12/02/2023  Medication Sig   acetaminophen  (TYLENOL ) 500 MG tablet Take 500 mg by mouth every 6 (six) hours as needed.   acyclovir  (ZOVIRAX ) 400 MG tablet Take 400 mg by mouth as needed.   aspirin  EC 81 MG tablet Take 81 mg by mouth daily.   Cholecalciferol (VITAMIN D  PO) Take 8,000 Int'l Units by mouth daily. 2000 units per capsule  CINNAMON PO Take 2,000 mg by mouth daily.   clonazePAM  (KLONOPIN ) 1 MG tablet Take 1 mg by mouth at bedtime as needed for anxiety.    diclofenac Sodium (VOLTAREN) 1 % GEL Apply topically as needed.   esomeprazole  (NEXIUM ) 40 MG capsule Take 1 capsule (40 mg total) by mouth daily at 12 noon.   fexofenadine (ALLEGRA ALLERGY) 180 MG tablet    glucose blood (ACCU-CHEK AVIVA PLUS) test strip CHECK BLOOD SUGAR 1 TIME DAILY. [id number]   hydrochlorothiazide  (HYDRODIURIL ) 25 MG  tablet TAKE 1 TABLET BY MOUTH DAILY FOR BLOOD PRESSURE AND FLUID RETENTION OR ANKLE SWELLING   hydroxychloroquine  (PLAQUENIL ) 200 MG tablet TAKE 1 TABLET BY MOUTH TWICE DAILY( EVERY 12 HOURS) MONDAY TO FRIDAY   Ibuprofen (ADVIL PO) Take by mouth as needed.   lamoTRIgine  (LAMICTAL ) 150 MG tablet Take 1 & 1/2 tablets at bedtime (Patient taking differently: once. Take 1 tablet at bedtime)   Lancets (ACCU-CHEK MULTICLIX) lancets Use to check blood glucose daily   Lysine 500 MG TABS Take 1,000 mg by mouth daily. Currently taking 2 pill QD   Magnesium  500 MG TABS Take 2,000 mg by mouth daily. Takes 4 tablets daily   meloxicam  (MOBIC ) 15 MG tablet Take  1/2 to 1 tablet  Daily  with Food  for Pain & Inflammation   Multiple Vitamin (MULTIVITAMIN WITH MINERALS) TABS tablet Take 1 tablet by mouth daily.   ondansetron  (ZOFRAN ) 4 MG tablet Take 4 mg by mouth daily as needed.   QUEtiapine  (SEROQUEL ) 100 MG tablet Take 100 mg by mouth 3 (three) times daily.   rosuvastatin  (CRESTOR ) 40 MG tablet TAKE 1/2 TABLET BY MOUTH DAILY FOR CHOLESTEROL   vitamin C (ASCORBIC ACID) 500 MG tablet Take 500 mg by mouth 2 (two) times daily.   zinc gluconate 50 MG tablet Take 50 mg by mouth daily.   No facility-administered encounter medications on file as of 12/02/2023.    Allergies (verified) Latex, Iodinated contrast media, Celecoxib, Cephalexin, Codeine, Erythromycin, Sulfonamide derivatives, Trazodone and nefazodone, and Venlafaxine   History: Past Medical History:  Diagnosis Date   Allergy    medicine and latex   Anxiety    Arthritis    Fibromyalgia    Hypertension    S/P total knee arthroplasty, left 01/07/2019   Simple renal cyst 12/12/2019   Incidentally noted on abd US  11/2019 - radiologist recommended further characterization with MRI w & w/o contrast   Hypoechoic lesions within the kidney which may simply represent mildly complicated cysts. The largest of these lies in the midportion of the kidney with  increased echogenicity within and suggestion of internal color flow which may be related to septation. MRI of the kidneys with and w   Sleep apnea    Status post total shoulder arthroplasty, left 01/07/2019   Vitamin D  deficiency    Past Surgical History:  Procedure Laterality Date   ABDOMINAL HYSTERECTOMY  2002   Dr. Laura Polio    COLONOSCOPY  2009   JOINT REPLACEMENT  05-18-13 and 01-06-19   arthroscopy   KNEE ARTHROPLASTY     TOTAL KNEE ARTHROPLASTY Right 04/14/2013   Procedure: RIGHT TOTAL KNEE ARTHROPLASTY AND LEFT KNEE INJECTION ;  Surgeon: Boston Byers, MD;  Location: [facility] OR;  Service: Orthopedics;  Laterality: Right;   TOTAL KNEE ARTHROPLASTY Left 01/06/2019   Procedure: TOTAL KNEE ARTHROPLASTY;  Surgeon: Neil Balls, MD;  Location: [facility];  Service: Orthopedics;  Laterality: Left;   Family History  Problem Relation Age of  Onset   Hypertension Mother    Heart attack Mother    Arthritis Mother    Hypertension Father    COPD Father    Thyroid cancer Father    Cancer Father    CVA Maternal Grandmother 3   Stroke Maternal Grandmother    Heart attack Maternal Grandfather 59   Deep vein thrombosis Maternal Grandfather    Arthritis Maternal Grandfather    Arthritis Maternal Aunt    Colon cancer Neg Hx    Esophageal cancer Neg Hx    Stomach cancer Neg Hx    Breast cancer Neg Hx    BRCA 1/2 Neg Hx    Social History   Socioeconomic History   Marital status: Married    Spouse name: Chief of Staff   Number of children: 2   Years of education: Not on file   Highest education level: Some college, no degree  Occupational History   Not on file  Tobacco Use   Smoking status: Never    Passive exposure: Past   Smokeless tobacco: Never  Vaping Use   Vaping status: Never Used  Substance and Sexual Activity   Alcohol use: No   Drug use: No   Sexual activity: Not on file  Other Topics Concern   Not on file  Social History Narrative   Retired. She was a Geologist, engineering       [name] [phone])   [name] (37)   Social Drivers of Corporate investment banker Strain: Low Risk  (12/02/2023)   Overall Financial Resource Strain (CARDIA)    Difficulty of Paying Living Expenses: Not hard at all  Food Insecurity: No Food Insecurity (12/02/2023)   Hunger Vital Sign    Worried About Running Out of Food in the Last Year: Never true    Ran Out of Food in the Last Year: Never true  Transportation Needs: No Transportation Needs (12/02/2023)   PRAPARE - Administrator, Civil Service (Medical): No    Lack of Transportation (Non-Medical): No  Physical Activity: Insufficiently Active (12/02/2023)   Exercise Vital Sign    Days of Exercise per Week: 2 days    Minutes of Exercise per Session: 20 min  Stress: Stress Concern Present (12/02/2023)   [employer] of Occupational Health - Occupational Stress Questionnaire    Feeling of Stress: To some extent  Social Connections: Socially Integrated (12/02/2023)   Social Connection and Isolation Panel    Frequency of Communication with Friends and Family: More than three times a week    Frequency of Social Gatherings with Friends and Family: Three times a week    Attends Religious Services: More than 4 times per year    Active Member of Clubs or Organizations: Yes    Attends Engineer, structural: More than 4 times per year    Marital Status: Married    Tobacco Counseling Counseling given: Not Answered    Clinical Intake:  Pre-visit preparation completed: Yes  Pain : 0-10 Pain Score: 6  Pain Location: Groin Pain Descriptors / Indicators: Aching Pain Onset: Today Pain Frequency: Rarely     BMI - recorded: 34.16 Nutritional Status: BMI > 30  Obese Nutritional Risks: None Diabetes: No  Lab Results  Component Value Date   HGBA1C 6.1 (H) 06/30/2023   HGBA1C 6.3 (H) 03/25/2023   HGBA1C 6.2 (H) 12/23/2022     How often do you need to have someone help you when you read instructions, pamphlets, or other  written materials  from your doctor or pharmacy?: 1 - Never What is the last grade level you completed in school?: 12  Interpreter Needed?: No  Information entered by :: Breanah Faddis,CMA   Activities of Daily Living     12/02/2023   10:10 AM 09/20/2023   11:42 AM  In your present state of health, do you have any difficulty performing the following activities:  Hearing? 0 0  Vision? 1 1  Comment  states of vision being a little off. has appt in may.  Difficulty concentrating or making decisions? 1 1  Comment  trouble remembering  Walking or climbing stairs? 0 1  Comment  slight difficulty with bad knees.  Dressing or bathing? 0 0  Doing errands, shopping? 0 0  Preparing Food and eating ? N   Using the Toilet? N   In the past six months, have you accidently leaked urine? N   Do you have problems with loss of bowel control? N   Managing your Medications? N   Managing your Finances? N   Housekeeping or managing your Housekeeping? N     Patient Care Team: Dorothe Gaster, NP as PCP - General (Nurse Practitioner) Neil Balls, MD as Consulting Physician (Orthopedic Surgery) Janel Medford, MD (Inactive) as Attending Physician (Gastroenterology) Larance Plater, MD as Consulting Physician (Psychiatry) Princella Brooklyn, OD (Optometry) Terresa Ferry, NP as Consulting Physician (Nurse Practitioner)  I have updated your Care Teams any recent Medical Services you may have received from other providers in the past year.     Assessment:   This is a routine wellness examination for [place].  Hearing/Vision screen No results found.   Goals Addressed             This Visit's Progress    Exercise 150 min/wk Moderate Activity   On track      Depression Screen     12/02/2023    1:20 PM 09/20/2023   11:49 AM 12/23/2022   11:30 AM 09/16/2022    9:13 PM 03/22/2022   10:45 PM 03/12/2021    9:38 AM 08/31/2020    9:05 PM  PHQ 2/9 Scores  PHQ - 2 Score 0 0 0 0 0 0 0  PHQ- 9 Score 0  5         Fall Risk     12/02/2023   10:10 AM 09/20/2023   11:49 AM 12/23/2022   11:30 AM 09/16/2022    9:13 PM 03/22/2022   10:45 PM  Fall Risk   Falls in the past year? 1 0 0 0 0  Number falls in past yr: 0 0     Injury with Fall? 0 0     Risk for fall due to : No Fall Risks No Fall Risks  No Fall Risks   Follow up Falls evaluation completed Falls evaluation completed  Falls evaluation completed;Education provided;Falls prevention discussed     MEDICARE RISK AT HOME:  Medicare Risk at Home Any stairs in or around the home?: (Patient-Rptd) Yes If so, are there any without handrails?: (Patient-Rptd) No Home free of loose throw rugs in walkways, pet beds, electrical cords, etc?: (Patient-Rptd) No Adequate lighting in your home to reduce risk of falls?: (Patient-Rptd) Yes Life alert?: (Patient-Rptd) No Use of a cane, walker or w/c?: (Patient-Rptd) No Grab bars in the bathroom?: (Patient-Rptd) Yes Shower chair or bench in shower?: (Patient-Rptd) Yes Elevated toilet seat or a handicapped toilet?: (Patient-Rptd) Yes  TIMED UP AND GO:  Was the test performed?  No  Cognitive Function: 6CIT completed        12/02/2023    1:17 PM  6CIT Screen  What Year? 0 points  What month? 0 points  What time? 0 points  Count back from 20 0 points  Months in reverse 0 points  Repeat phrase 0 points  Total Score 0 points    Immunizations Immunization History  Administered Date(s) Administered   DT (Pediatric) 07/05/2015   Fluad Quad(high Dose 65+) 04/08/2022   Influenza Inj Mdck Quad With Preservative 05/28/2017, 04/13/2018   Influenza Split 04/06/2012, 04/05/2013, 05/16/2014, 03/21/2015   Influenza,inj,Quad PF,6+ Mos 03/16/2019   Influenza,inj,quad, With Preservative 04/29/2016   PFIZER(Purple Top)SARS-COV-2 Vaccination 11/08/2019, 11/29/2019, 07/24/2020, 08/13/2020   Pneumococcal Conjugate-13 05/16/2014   Pneumococcal-Unspecified 06/22/1998   Td 06/22/2004   Zoster  Recombinant(Shingrix) 10/16/2016   Zoster, Live 10/21/2015, 11/21/2015    Screening Tests Health Maintenance  Topic Date Due   Pneumococcal Vaccine: 50+ Years (2 of 2 - PPSV23) 05/17/2015   Zoster Vaccines- Shingrix (2 of 2) 12/11/2016   COVID-19 Vaccine (5 - 2024-25 season) 02/21/2023   DEXA SCAN  09/04/2023   INFLUENZA VACCINE  01/21/2024   MAMMOGRAM  09/15/2024   Medicare Annual Wellness (AWV)  12/01/2024   DTaP/Tdap/Td (3 - Tdap) 07/04/2025   Colonoscopy  12/02/2027   Hepatitis C Screening  Completed   HPV VACCINES  Aged Out   Meningococcal B Vaccine  Aged Out    Health Maintenance  Health Maintenance Due  Topic Date Due   Pneumococcal Vaccine: 50+ Years (2 of 2 - PPSV23) 05/17/2015   Zoster Vaccines- Shingrix (2 of 2) 12/11/2016   COVID-19 Vaccine (5 - 2024-25 season) 02/21/2023   DEXA SCAN  09/04/2023   Health Maintenance Items Addressed: Patient declined health maintenance  Additional Screening:  Vision Screening: Recommended annual ophthalmology exams for early detection of glaucoma and other disorders of the eye. Would you like a referral to an eye doctor? No    Dental Screening: Recommended annual dental exams for proper oral hygiene  Community Resource Referral / Chronic Care Management: CRR required this visit?  No   CCM required this visit?  No   Plan:    I have personally reviewed and noted the following in the patient's chart:   Medical and social history Use of alcohol, tobacco or illicit drugs  Current medications and supplements including opioid prescriptions. Patient is not currently taking opioid prescriptions. Functional ability and status Nutritional status Physical activity Advanced directives List of other physicians Hospitalizations, surgeries, and ER visits in previous 12 months Vitals Screenings to include cognitive, depression, and falls Referrals and appointments  In addition, I have reviewed and discussed with patient  certain preventive protocols, quality metrics, and best practice recommendations. A written personalized care plan for preventive services as well as general preventive health recommendations were provided to patient.   Freeda Jerry, [place]   12/02/2023   After Visit Summary: (MyChart) Due to this being a telephonic visit, the after visit summary with patients personalized plan was offered to patient via MyChart   Notes: Nothing significant to report at this time.

## 2023-12-02 NOTE — Patient Instructions (Signed)
 Ms. Labrum , Thank you for taking time out of your busy schedule to complete your Annual Wellness Visit with me. I enjoyed our conversation and look forward to speaking with you again next year. I, as well as your care team,  appreciate your ongoing commitment to your health goals. Please review the following plan we discussed and let me know if I can assist you in the future. Your Game plan/ To Do List    Referrals: If you haven't heard from the office you've been referred to, please reach out to them at the phone provided.  none Follow up Visits: Next Medicare AWV with our clinical staff: 0616/2026   Have you seen your provider in the last 6 months (3 months if uncontrolled diabetes)? Yes Next Office Visit with your provider: 03/22/2024  Clinician Recommendations:  Aim for 30 minutes of exercise or brisk walking, 6-8 glasses of water, and 5 servings of fruits and vegetables each day.       This is a list of the screening recommended for you and due dates:  Health Maintenance  Topic Date Due   Pneumococcal Vaccine for age over 40 (2 of 2 - PPSV23) 05/17/2015   Zoster (Shingles) Vaccine (2 of 2) 12/11/2016   COVID-19 Vaccine (5 - 2024-25 season) 02/21/2023   DEXA scan (bone density measurement)  09/04/2023   Flu Shot  01/21/2024   Mammogram  09/15/2024   Medicare Annual Wellness Visit  12/01/2024   DTaP/Tdap/Td vaccine (3 - Tdap) 07/04/2025   Colon Cancer Screening  12/02/2027   Hepatitis C Screening  Completed   HPV Vaccine  Aged Out   Meningitis B Vaccine  Aged Out    Advanced directives: (Declined) Advance directive discussed with you today. Even though you declined this today, please call our office should you change your mind, and we can give you the proper paperwork for you to fill out. Advance Care Planning is important because it:  [x]  Makes sure you receive the medical care that is consistent with your values, goals, and preferences  [x]  It provides guidance to your  family and loved ones and reduces their decisional burden about whether or not they are making the right decisions based on your wishes.  Follow the link provided in your after visit summary or read over the paperwork we have mailed to you to help you started getting your Advance Directives in place. If you need assistance in completing these, please reach out to us  so that we can help you!  See attachments for Preventive Care and Fall Prevention Tips.

## 2023-12-08 DIAGNOSIS — G4733 Obstructive sleep apnea (adult) (pediatric): Secondary | ICD-10-CM | POA: Diagnosis not present

## 2023-12-09 ENCOUNTER — Other Ambulatory Visit: Payer: Self-pay | Admitting: Nurse Practitioner

## 2023-12-09 DIAGNOSIS — R7309 Other abnormal glucose: Secondary | ICD-10-CM

## 2023-12-09 MED ORDER — ACCU-CHEK AVIVA PLUS VI STRP: ORAL_STRIP | 1 refills | Status: AC

## 2023-12-09 NOTE — Telephone Encounter (Signed)
 Copied from CRM [id number]. Topic: Clinical - Medication Refill >> Dec 09, 2023  2:20 PM Chuck Crater wrote: Medication: glucose blood (ACCU-CHEK AVIVA PLUS) test strip  Has the patient contacted their pharmacy? Yes (Agent: If no, request that the patient contact the pharmacy for the refill. If patient does not wish to contact the pharmacy document the reason why and proceed with request.) (Agent: If yes, when and what did the pharmacy advise?) awaiting response from doctor   This is the patient's preferred pharmacy:  [id number] - [place], [place] - [address] AT [address] [facility] [address] [place] [address] Phone: [phone] Fax: [phone]  Is this the correct pharmacy for this prescription? Yes If no, delete pharmacy and type the correct one.   Has the prescription been filled recently? No  Is the patient out of the medication? No  Has the patient been seen for an appointment in the last year OR does the patient have an upcoming appointment? Yes  Can we respond through MyChart? Yes  Agent: Please be advised that Rx refills may take up to 3 business days. We ask that you follow-up with your pharmacy.

## 2023-12-15 ENCOUNTER — Other Ambulatory Visit: Payer: Self-pay | Admitting: Rheumatology

## 2023-12-15 DIAGNOSIS — Z79899 Other long term (current) drug therapy: Secondary | ICD-10-CM | POA: Diagnosis not present

## 2023-12-15 DIAGNOSIS — M0609 Rheumatoid arthritis without rheumatoid factor, multiple sites: Secondary | ICD-10-CM

## 2023-12-15 NOTE — Telephone Encounter (Signed)
 Last Fill: 09/22/2023  Eye exam: 11/17/2023 WNL    Labs: 11/23/2023 CBC is normal, CMP shows elevated ALT (liver function) which is stable.   Next Visit: 02/23/2024  Last Visit: 09/22/2023  DX: Rheumatoid arthritis of multiple sites with negative rheumatoid factor   Current Dose per office note 09/22/2023: Plaquenil  200 mg 1 tablet by mouth twice daily Monday to Friday   Okay to refill Plaquenil ?

## 2023-12-23 ENCOUNTER — Ambulatory Visit: Payer: 59 | Admitting: Nurse Practitioner

## 2023-12-30 DIAGNOSIS — G4733 Obstructive sleep apnea (adult) (pediatric): Secondary | ICD-10-CM | POA: Diagnosis not present

## 2024-01-06 ENCOUNTER — Ambulatory Visit: Payer: 59 | Admitting: Nurse Practitioner

## 2024-01-07 DIAGNOSIS — G4733 Obstructive sleep apnea (adult) (pediatric): Secondary | ICD-10-CM | POA: Diagnosis not present

## 2024-01-19 NOTE — Progress Notes (Unsigned)
 SABRA

## 2024-01-19 NOTE — Progress Notes (Unsigned)
 PATIENT: [name] DOB: [birth year]  REASON FOR VISIT: follow up HISTORY FROM: patient  No chief complaint on file.    HISTORY OF PRESENT ILLNESS:  01/19/24 ALL:  [name] is a 68 y.o. female here today for follow up for OSA on CPAP.   01/14/2023 ALL:  [name] is a 68 y.o. female here today for follow up for OSA on CPAP.  She was seen in consult with Dr Chalice 07/2022 in need of new autoPAP machine. Split night study 10/2022 showed severe OSA with AHI 36.3/hr. Apnea resolved at 12cmH20. AutoPAP was ordered with setting of 6-13cmH20 and N20 mask in small. Since, she reports doing very well. She is sleeping much better. She is very happy with new machine and feels mask fits well. She denies concerns with machine or supplies.      HISTORY: (copied from Dr Dohmeier's previous note)  [name] is a 68 y.o. female patient who is seen upon referral on 08/10/2022 from Dr Tonita, MD for transfer of sleep medicine Care for a patient on OSA / CPAP care for 2 decades. Her insomnia medications are filled by NP Jones.  .  Chief concern according to patient :  I depend on CPAP, I can't breathe without it- when I forget to take my CPAP I need to sleep elevated , seated and snore loudly.    I have the pleasure of seeing [name] 08/10/22 a right -handed female with a possible sleep disorder.    The patient had the first sleep study in the year 2000 , she thinks - it was on [address] [facility]-    [name] is using an autotitration CPAP device by [employer] on factory settings, 5-20 cm water, 3 cm EPR, residual AHI was 2.9/h. her compliance for 90 days was 94% . She  uses a nasal mask- has congestion,  and condensation water- stopped using water. Lots of air leaks. Needs new mask and headgear, HST or attended sleep study to see baseline, which has not been investigated in 20 years.    Sleep relevant medical history: Nocturia 2-3 times on CPAP- dry mouth,  bioteen, snoring, no Sleep walking, no ENT procedures- no Tonsillectomy no cervical spine surgery, no allergies.     Family medical /sleep history: Brother is another family member with OSA,  CPAP is not used .  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  and lives in a household with spouse . Family status is married , with 2 adult children,  6 grandchildren.  A dog is present.chickens outside.  Tobacco use: none .  ETOH use ; none,  Caffeine intake in form of Coffee( mostly decaff) Soda( /) Tea ( /) or energy drinks Exercise in form of gardening, walking - but is SOB a lot. .   Hobbies : chicks.    Sleep habits are as follows: The patient's dinner time is between 5-7 PM. The patient goes to bed at 11 PM and struggles with sleep onset- continues to sleep for 8 hours ( I am restless, fighting with the CPAP) , wakes for several  bathroom breaks, the first time at 2 AM.   The preferred sleep position is supine or laterally-, with the support of 1 pillow. She has arthritis pain in hands-  Dreams are reportedly rare.  The patient wakes up spontaneously at 7.30 , 8  AM is the usual rise time. She reports  recently not feeling refreshed or restored in AM, with symptoms such as dry mouth, congested nose, and frequent  morning headaches, and residual fatigue.  Naps are taken frequently, lasting from 10 to 25 minutes and are more refreshing than nocturnal sleep.    REVIEW OF SYSTEMS: Out of a complete 14 system review of symptoms, the patient complains only of the following symptoms, fatigue, depression and all other reviewed systems are negative.  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     acyclovir  (ZOVIRAX ) 400 MG tablet Take 400 mg by mouth as needed.     aspirin  EC 81 MG tablet Take 81 mg by mouth daily.     Cholecalciferol (VITAMIN D  PO) Take 8,000 Int'l Units by mouth daily. 2000 units per capsule     CINNAMON PO Take 2,000 mg by mouth daily.     clonazePAM  (KLONOPIN ) 1 MG tablet Take 1 mg by mouth at bedtime as needed for anxiety.      diclofenac Sodium (VOLTAREN) 1 % GEL Apply topically as needed.     esomeprazole  (NEXIUM ) 40 MG capsule Take 1 capsule (40 mg total) by mouth daily at 12 noon. 90 capsule 1   fexofenadine (ALLEGRA ALLERGY) 180 MG tablet      glucose blood (ACCU-CHEK AVIVA PLUS) test strip CHECK BLOOD SUGAR 1 TIME DAILY. DX-R73.03 100 each 1   hydrochlorothiazide  (HYDRODIURIL ) 25 MG tablet TAKE 1 TABLET BY MOUTH DAILY FOR BLOOD PRESSURE AND FLUID RETENTION OR ANKLE SWELLING 90 tablet 3   hydroxychloroquine  (PLAQUENIL ) 200 MG tablet TAKE 1 TABLET BY MOUTH TWICE DAILY( EVERY 12 HOURS) MONDAY TO FRIDAY 120 tablet 0   Ibuprofen (ADVIL PO) Take by mouth as needed.     lamoTRIgine  (LAMICTAL ) 150 MG tablet Take 1 & 1/2 tablets at bedtime (Patient taking differently: once. Take 1 tablet at bedtime) 135 tablet 1   Lancets (ACCU-CHEK MULTICLIX) lancets Use to check blood glucose daily 100 each PRN   Lysine 500 MG TABS Take 1,000 mg by mouth daily. Currently taking 2 pill QD     Magnesium  500 MG TABS Take 2,000 mg by mouth daily. Takes 4 tablets daily     meloxicam  (MOBIC ) 15 MG tablet Take  1/2 to 1 tablet  Daily  with Food  for Pain & Inflammation 90 tablet 3   Multiple Vitamin (MULTIVITAMIN WITH MINERALS) TABS tablet  Take 1 tablet by mouth daily.     ondansetron  (ZOFRAN ) 4 MG tablet Take 4 mg by mouth daily as needed.     QUEtiapine  (SEROQUEL ) 100 MG tablet Take 100 mg by mouth 3 (three) times daily.     rosuvastatin  (CRESTOR ) 40 MG tablet TAKE 1/2 TABLET BY MOUTH DAILY FOR CHOLESTEROL 45 tablet  3   vitamin C (ASCORBIC ACID) 500 MG tablet Take 500 mg by mouth 2 (two) times daily.     zinc gluconate 50 MG tablet Take 50 mg by mouth daily.     No facility-administered medications prior to visit.    PAST MEDICAL HISTORY: Past Medical History:  Diagnosis Date   Allergy    medicine and latex   Anxiety    Arthritis    Fibromyalgia    Hypertension    S/P total knee arthroplasty, left 01/07/2019   Simple renal cyst 12/12/2019   Incidentally noted on abd US  11/2019 - radiologist recommended further characterization with MRI w & w/o contrast   Hypoechoic lesions within the kidney which may simply represent mildly complicated cysts. The largest of these lies in the midportion of the kidney with increased echogenicity within and suggestion of internal color flow which may be related to septation. MRI of the kidneys with and w   Sleep apnea    Status post total shoulder arthroplasty, left 01/07/2019   Vitamin D  deficiency     PAST SURGICAL HISTORY: Past Surgical History:  Procedure Laterality Date   ABDOMINAL HYSTERECTOMY  2002   Dr. Renne    COLONOSCOPY  2009   JOINT REPLACEMENT  05-18-13 and 01-06-19   arthroscopy   KNEE ARTHROPLASTY     TOTAL KNEE ARTHROPLASTY Right 04/14/2013   Procedure: RIGHT TOTAL KNEE ARTHROPLASTY AND LEFT KNEE INJECTION ;  Surgeon: Norleen LITTIE Gavel, MD;  Location: [facility] OR;  Service: Orthopedics;  Laterality: Right;   TOTAL KNEE ARTHROPLASTY Left 01/06/2019   Procedure: TOTAL KNEE ARTHROPLASTY;  Surgeon: Gavel Norleen, MD;  Location: [facility];  Service: Orthopedics;  Laterality: Left;    FAMILY HISTORY: Family History  Problem Relation Age of Onset   Hypertension Mother    Heart  attack Mother    Arthritis Mother    Hypertension Father    COPD Father    Thyroid cancer Father    Cancer Father    CVA Maternal Grandmother 49   Stroke Maternal Grandmother    Heart attack Maternal Grandfather 59   Deep vein thrombosis Maternal Grandfather    Arthritis Maternal Grandfather    Arthritis Maternal Aunt    Colon cancer Neg Hx    Esophageal cancer Neg Hx    Stomach cancer Neg Hx    Breast cancer Neg Hx    BRCA 1/2 Neg Hx     SOCIAL HISTORY: Social History   Socioeconomic History   Marital status: Married    Spouse name: Chief of Staff   Number of children: 2   Years of education: Not on file   Highest education level: Some college, no degree  Occupational History   Not on file  Tobacco Use   Smoking status: Never    Passive exposure: Past   Smokeless tobacco: Never  Vaping Use   Vaping status: Never Used  Substance and Sexual Activity   Alcohol use: No   Drug use: No   Sexual activity: Not on file  Other Topics Concern   Not on file  Social History Narrative   Retired. She was a Geologist, engineering      [name] [phone])   [name] (37)   Social Drivers of Health   Financial Resource Strain: Low Risk  (12/02/2023)   Overall Financial Resource Strain (CARDIA)    Difficulty of Paying Living Expenses: Not hard at all  Food Insecurity: No Food Insecurity (12/02/2023)   Hunger Vital Sign  Worried About Programme researcher, broadcasting/film/video in the Last Year: Never true    Ran Out of Food in the Last Year: Never true  Transportation Needs: No Transportation Needs (12/02/2023)   PRAPARE - Administrator, Civil Service (Medical): No    Lack of Transportation (Non-Medical): No  Physical Activity: Insufficiently Active (12/02/2023)   Exercise Vital Sign    Days of Exercise per Week: 2 days    Minutes of Exercise per Session: 20 min  Stress: Stress Concern Present (12/02/2023)   [employer] of Occupational Health - Occupational Stress Questionnaire    Feeling of Stress:  To some extent  Social Connections: Socially Integrated (12/02/2023)   Social Connection and Isolation Panel    Frequency of Communication with Friends and Family: More than three times a week    Frequency of Social Gatherings with Friends and Family: Three times a week    Attends Religious Services: More than 4 times per year    Active Member of Clubs or Organizations: Yes    Attends Banker Meetings: More than 4 times per year    Marital Status: Married  Catering manager Violence: Not At Risk (12/02/2023)   Humiliation, Afraid, Rape, and Kick questionnaire    Fear of Current or Ex-Partner: No    Emotionally Abused: No    Physically Abused: No    Sexually Abused: No     PHYSICAL EXAM  There were no vitals filed for this visit.  There is no height or weight on file to calculate BMI.  Generalized: Well developed, in no acute distress  Cardiology: normal rate and rhythm, no murmur noted Respiratory: clear to auscultation bilaterally  Neurological examination  Mentation: Alert oriented to time, place, history taking. Follows all commands speech and language fluent Cranial nerve II-XII: Pupils were equal round reactive to light. Extraocular movements were full, visual field were full  Motor: The motor testing reveals 5 over 5 strength of all 4 extremities. Good symmetric motor tone is noted throughout.  Gait and station: Gait is normal.    DIAGNOSTIC DATA (LABS, IMAGING, TESTING) - I reviewed patient records, labs, notes, testing and imaging myself where available.      No data to display           Lab Results  Component Value Date   WBC 6.3 11/23/2023   HGB 13.8 11/23/2023   HCT 42.0 11/23/2023   MCV 91.7 11/23/2023   PLT 288 11/23/2023      Component Value Date/Time   NA 141 11/23/2023 1434   K 4.1 11/23/2023 1434   CL 103 11/23/2023 1434   CO2 29 11/23/2023 1434   GLUCOSE 103 (H) 11/23/2023 1434   BUN 17 11/23/2023 1434   CREATININE 0.78  11/23/2023 1434   CALCIUM  9.3 11/23/2023 1434   PROT 6.6 11/23/2023 1434   ALBUMIN 4.6 02/08/2023 1349   AST 24 11/23/2023 1434   ALT 33 (H) 11/23/2023 1434   ALKPHOS 101 02/08/2023 1349   BILITOT 0.4 11/23/2023 1434   GFRNONAA >60 02/08/2023 1349   GFRNONAA 89 09/02/2020 1048   GFRAA 103 09/02/2020 1048   Lab Results  Component Value Date   CHOL 118 06/30/2023   HDL 50 06/30/2023   LDLCALC 46 06/30/2023   TRIG 136 06/30/2023   CHOLHDL 2.4 06/30/2023   Lab Results  Component Value Date   HGBA1C 6.1 (H) 06/30/2023   Lab Results  Component Value Date   VITAMINB12 524 04/29/2016  Lab Results  Component Value Date   TSH 1.35 03/25/2023     ASSESSMENT AND PLAN 68 y.o. year old female  has a past medical history of Allergy, Anxiety, Arthritis, Fibromyalgia, Hypertension, S/P total knee arthroplasty, left (01/07/2019), Simple renal cyst (12/12/2019), Sleep apnea, Status post total shoulder arthroplasty, left (01/07/2019), and Vitamin D  deficiency. here with   No diagnosis found.    ALEXANDER MCAULEY is doing well on CPAP therapy. Compliance report reveals excellent compliance. She was encouraged to continue using CPAP nightly and for greater than 4 hours each night. We will update supply orders as indicated. Risks of untreated sleep apnea review and education materials provided. Healthy lifestyle habits encouraged. She will follow up in 1 year, sooner if needed. She verbalizes understanding and agreement with this plan.    No orders of the defined types were placed in this encounter.    No orders of the defined types were placed in this encounter.     Greig Forbes, FNP-C 01/19/2024, 1:56 PM [facility] [address], [address] [place], [place] [address] [phone]

## 2024-01-19 NOTE — Patient Instructions (Incomplete)

## 2024-01-20 ENCOUNTER — Ambulatory Visit: Payer: Medicare PPO | Admitting: Family Medicine

## 2024-01-20 ENCOUNTER — Encounter: Payer: Self-pay | Admitting: Family Medicine

## 2024-01-20 VITALS — BP 144/91 | HR 90 | Ht 64.0 in | Wt 201.0 lb

## 2024-01-20 DIAGNOSIS — G4733 Obstructive sleep apnea (adult) (pediatric): Secondary | ICD-10-CM

## 2024-01-21 ENCOUNTER — Other Ambulatory Visit: Payer: Self-pay | Admitting: Nurse Practitioner

## 2024-02-01 DIAGNOSIS — G4733 Obstructive sleep apnea (adult) (pediatric): Secondary | ICD-10-CM | POA: Diagnosis not present

## 2024-02-07 DIAGNOSIS — G4733 Obstructive sleep apnea (adult) (pediatric): Secondary | ICD-10-CM | POA: Diagnosis not present

## 2024-02-10 NOTE — Progress Notes (Unsigned)
 Office Visit Note  Patient: [name]             Date of Birth: 02-09-1956           MRN: [id number]             PCP: Wendee Lynwood HERO, NP Referring: Wendee Lynwood HERO, NP Visit Date: 02/23/2024 Occupation: @GUAROCC @  Subjective:  Chronic pain in both hands  History of Present Illness: [name] is a 68 y.o. female with history of seronegative rheumatoid arthritis and osteoarthritis.  She is taking Plaquenil  200 mg 1 tablet by mouth twice daily Monday through Friday.  She continues to tolerate Plaquenil  without any side effects and has not had any gaps in therapy.  Patient has been taken meloxicam  15 mg daily for pain relief and takes Tylenol  as needed for breakthrough symptoms.  She continues to have chronic pain and stiffness in both hands as well as on the dorsal aspect of the right foot.  She had an injection performed on 09/22/2023 on the dorsal aspect of her right foot which was painful and did not provide any relief.  She does not plan on proceeding with a repeat injection. She denies any new medical conditions.  Activities of Daily Living:  Patient reports morning stiffness for 4-6 hours.   Patient Reports nocturnal pain.  Difficulty dressing/grooming: Denies Difficulty climbing stairs: Denies Difficulty getting out of chair: Reports Difficulty using hands for taps, buttons, cutlery, and/or writing: Reports  Review of Systems  Constitutional:  Negative for fatigue.  HENT:  Negative for mouth sores and mouth dryness.   Eyes:  Positive for dryness.  Respiratory:  Positive for shortness of breath.   Cardiovascular:  Negative for chest pain and palpitations.  Gastrointestinal:  Negative for blood in stool, constipation and diarrhea.  Endocrine: Negative for increased urination.  Genitourinary:  Negative for involuntary urination.  Musculoskeletal:  Positive for joint pain, gait problem, joint pain, joint swelling, myalgias, morning stiffness and myalgias. Negative for  muscle weakness and muscle tenderness.  Skin:  Negative for color change, rash, hair loss and sensitivity to sunlight.  Allergic/Immunologic: Negative for susceptible to infections.  Neurological:  Positive for headaches. Negative for dizziness.  Hematological:  Negative for swollen glands.  Psychiatric/Behavioral:  Positive for sleep disturbance. Negative for depressed mood. The patient is nervous/anxious.     PMFS History:  Patient Active Problem List   Diagnosis Date Noted   Encounter to establish care 09/20/2023   Osteopenia 09/03/2021   Seronegative rheumatoid arthritis (HCC) 03/12/2021   Hyperlipidemia 03/11/2021   Fatty liver 12/12/2019   Other abnormal glucose (prediabetes) 05/23/2019   Osteoarthritis of both knees 03/20/2015   OSA on CPAP 08/15/2014   Other chronic pain 01/31/2014   Depression, major, recurrent, in partial remission (HCC) 01/31/2014   Morbid obesity (HCC) - BMI 30+ with OSA 01/31/2014   Essential hypertension 08/09/2013   Vitamin D  deficiency 08/09/2013    Past Medical History:  Diagnosis Date   Allergy    medicine and latex   Anxiety    Arthritis    Fibromyalgia    Hypertension    S/P total knee arthroplasty, left 01/07/2019   Simple renal cyst 12/12/2019   Incidentally noted on abd US  11/2019 - radiologist recommended further characterization with MRI w & w/o contrast   Hypoechoic lesions within the kidney which may simply represent mildly complicated cysts. The largest of these lies in the midportion of the kidney with increased echogenicity within and  suggestion of internal color flow which may be related to septation. MRI of the kidneys with and w   Sleep apnea    Status post total shoulder arthroplasty, left 01/07/2019   Vitamin D  deficiency     Family History  Problem Relation Age of Onset   Hypertension Mother    Heart attack Mother    Arthritis Mother    Hypertension Father    COPD Father    Thyroid cancer Father    Cancer Father     CVA Maternal Grandmother 2   Stroke Maternal Grandmother    Heart attack Maternal Grandfather 59   Deep vein thrombosis Maternal Grandfather    Arthritis Maternal Grandfather    Arthritis Maternal Aunt    Colon cancer Neg Hx    Esophageal cancer Neg Hx    Stomach cancer Neg Hx    Breast cancer Neg Hx    BRCA 1/2 Neg Hx    Past Surgical History:  Procedure Laterality Date   ABDOMINAL HYSTERECTOMY  2002   Dr. Renne    COLONOSCOPY  2009   JOINT REPLACEMENT  05-18-13 and 01-06-19   arthroscopy   KNEE ARTHROPLASTY     TOTAL KNEE ARTHROPLASTY Right 04/14/2013   Procedure: RIGHT TOTAL KNEE ARTHROPLASTY AND LEFT KNEE INJECTION ;  Surgeon: Norleen LITTIE Gavel, MD;  Location: [facility] OR;  Service: Orthopedics;  Laterality: Right;   TOTAL KNEE ARTHROPLASTY Left 01/06/2019   Procedure: TOTAL KNEE ARTHROPLASTY;  Surgeon: Gavel Norleen, MD;  Location: [facility];  Service: Orthopedics;  Laterality: Left;   Social History   Social History Narrative   Retired. She was a Geologist, engineering      [name] [phone])   [name] (37)   Immunization History  Administered Date(s) Administered   DT (Pediatric) 07/05/2015   Fluad Quad(high Dose 65+) 04/08/2022   Influenza Inj Mdck Quad With Preservative 05/28/2017, 04/13/2018   Influenza Split 04/06/2012, 04/05/2013, 05/16/2014, 03/21/2015   Influenza,inj,Quad PF,6+ Mos 03/16/2019   Influenza,inj,quad, With Preservative 04/29/2016   PFIZER(Purple Top)SARS-COV-2 Vaccination 11/08/2019, 11/29/2019, 07/24/2020, 08/13/2020   Pneumococcal Conjugate-13 05/16/2014   Pneumococcal-Unspecified 06/22/1998   Td 06/22/2004   Zoster Recombinant(Shingrix) 10/16/2016   Zoster, Live 10/21/2015, 11/21/2015     Objective: Vital Signs: BP 116/73 (BP Location: Left Arm, Patient Position: Sitting, Cuff Size: Normal)   Pulse 83   Resp 16   Ht 5' 4 (1.626 m)   Wt 201 lb 12.8 oz (91.5 kg)   BMI 34.64 kg/m    Physical Exam Vitals and nursing note reviewed.  Constitutional:       Appearance: She is well-developed.  HENT:     Head: Normocephalic and atraumatic.  Eyes:     Conjunctiva/sclera: Conjunctivae normal.  Cardiovascular:     Rate and Rhythm: Normal rate and regular rhythm.     Heart sounds: Normal heart sounds.  Pulmonary:     Effort: Pulmonary effort is normal.     Breath sounds: Normal breath sounds.  Abdominal:     General: Bowel sounds are normal.     Palpations: Abdomen is soft.  Musculoskeletal:     Cervical back: Normal range of motion.  Lymphadenopathy:     Cervical: No cervical adenopathy.  Skin:    General: Skin is warm and dry.     Capillary Refill: Capillary refill takes less than 2 seconds.  Neurological:     Mental Status: She is alert and oriented to person, place, and time.  Psychiatric:  Behavior: Behavior normal.      Musculoskeletal Exam: C-spine has limited range of motion without rotation.  Shoulder joints, elbow joints, wrist joints have good range of motion with no tenderness or discomfort.  No tenderness or synovitis over MCP joints.  PIP and DIP thickening consistent with osteoarthritis of both hands.  Erythema and tenderness over the DIP joints.  Complete fist formation noted.  Hip joints have good range of motion with no groin pain.  Both knee replacements have good range of motion with no warmth or effusion.  Dorsal spurs noted bilaterally, right more severe than left.  CDAI Exam: CDAI Score: -- Patient Global: --; Provider Global: -- Swollen: --; Tender: -- Joint Exam 02/23/2024   No joint exam has been documented for this visit   There is currently no information documented on the homunculus. Go to the Rheumatology activity and complete the homunculus joint exam.  Investigation: No additional findings.  Imaging: No results found.  Recent Labs: Lab Results  Component Value Date   WBC 6.3 11/23/2023   HGB 13.8 11/23/2023   PLT 288 11/23/2023   NA 141 11/23/2023   K 4.1 11/23/2023   CL 103  11/23/2023   CO2 29 11/23/2023   GLUCOSE 103 (H) 11/23/2023   BUN 17 11/23/2023   CREATININE 0.78 11/23/2023   BILITOT 0.4 11/23/2023   ALKPHOS 101 02/08/2023   AST 24 11/23/2023   ALT 33 (H) 11/23/2023   PROT 6.6 11/23/2023   ALBUMIN 4.6 02/08/2023   CALCIUM  9.3 11/23/2023   GFRAA 103 09/02/2020    Speciality Comments: PLQ Eye Exam: 11/17/2023 WNL @ [facility]   Procedures:  No procedures performed Allergies: Latex, Iodinated contrast media, Celecoxib, Cephalexin, Codeine, Erythromycin, Sulfonamide derivatives, Trazodone and nefazodone, and Venlafaxine   Assessment / Plan:     Visit Diagnoses: Rheumatoid arthritis of multiple sites with negative rheumatoid factor (HCC) - RF-, +anti-CCP: She has no synovitis on examination today.  She has not had any signs or symptoms of a rheumatoid arthritis flare.  She has clinically been doing well taking Plaquenil  200 mg 1 tablet by mouth twice daily Monday through Friday.  She is tolerating Plaquenil  without any side effects and has not had any gaps in therapy.  She continues to have chronic pain in both hands due to underlying osteoarthritis.  She has been taking meloxicam  15 mg 1 tablet daily and Tylenol  as needed for breakthrough symptoms.  Discussed the use of arthritis compression gloves, Epsom salt soaks, paraffin wax.  Also discussed the option of a referral to hand therapy.  Discussed that if she starts to have pain in the MCP joints or wrist joints we can schedule ultrasound to assess for active disease.  X-rays from May 2024 were consistent with osteoarthritic changes.  No radiographic progression was noted when compared to x-rays from 2021.  Patient will remain on Plaquenil  as prescribed.  She was advised to notify us  if she develops any new or worsening symptoms.  She will follow-up in the office in 5 months or sooner if needed.  High risk medication use - Plaquenil  200 mg 1 tablet by mouth twice daily Monday to Friday. CBC and CMP  updated on  11/23/23.  PLQ Eye Exam: 11/17/2023 WNL @ [facility]  - Plan: CBC with Differential/Platelet, Comprehensive metabolic panel with GFR  Positive anti-CCP test - Anti-CCP 39 (weak positive), RF negative, ESR 6.   Primary osteoarthritis of both hands: She has PIP and DIP thickening consistent with osteoarthritis  of both hands.  Erythema and tenderness over the DIP joints noted.  She is able to make a complete fist but has continued to have chronic pain in both hands on a daily basis.  She is no tenderness or synovitis over MCP joints or the wrist joints.  X-rays of both hands from May 2024 are consistent with osteoarthritic changes-no radiographic progression when compared to x-rays from 2021.  No erosive changes noted.  She has been taking meloxicam  15 mg daily and Tylenol  for breakthrough symptoms.  She is unsure if meloxicam  is helping to alleviate the discomfort in her hands. Discussed that if she starts to have more pain in the MCP joints or wrist joints we can schedule an ultrasound to assess for synovitis.  She voiced understanding.  Status post total bilateral knee replacement: Doing well.  Good range of motion no warmth or effusion.  Pain in right foot: Chronic pain.  Patient continues to have tenderness on the dorsal aspect of the right foot.  Dorsal spurs noted on bilateral feet.  She had a right intertarsal joint injection performed on 09/22/2023 which reported no relief--she does not plan to proceed with repeat injection.  Discussed the importance of wearing proper fitting shoes.  Primary osteoarthritis of both feet: Chronic pain-right foot worse than left.  Fibromyalgia: She has intermittent myalgias and muscle tenderness due to fibromyalgia.  Discussed importance of regular exercise and good sleep hygiene.  Other medical conditions are listed as follows:  History of prediabetes  Mixed hyperlipidemia  Essential hypertension: Blood pressure was 116/73 today in  office.  Depression, major, recurrent, in partial remission (HCC)  OSA on CPAP  Orders: Orders Placed This Encounter  Procedures   CBC with Differential/Platelet   Comprehensive metabolic panel with GFR   No orders of the defined types were placed in this encounter.   Follow-Up Instructions: Return in about 5 months (around 07/25/2024) for Rheumatoid arthritis, Osteoarthritis.   Waddell CHRISTELLA Craze, PA-C  Note - This record has been created using Dragon software.  Chart creation errors have been sought, but may not always  have been located. Such creation errors do not reflect on  the standard of medical care.

## 2024-02-11 DIAGNOSIS — H2512 Age-related nuclear cataract, left eye: Secondary | ICD-10-CM | POA: Diagnosis not present

## 2024-02-11 DIAGNOSIS — H18413 Arcus senilis, bilateral: Secondary | ICD-10-CM | POA: Diagnosis not present

## 2024-02-11 DIAGNOSIS — H25013 Cortical age-related cataract, bilateral: Secondary | ICD-10-CM | POA: Diagnosis not present

## 2024-02-11 DIAGNOSIS — H2513 Age-related nuclear cataract, bilateral: Secondary | ICD-10-CM | POA: Diagnosis not present

## 2024-02-11 DIAGNOSIS — H25043 Posterior subcapsular polar age-related cataract, bilateral: Secondary | ICD-10-CM | POA: Diagnosis not present

## 2024-02-16 DIAGNOSIS — F3132 Bipolar disorder, current episode depressed, moderate: Secondary | ICD-10-CM | POA: Diagnosis not present

## 2024-02-23 ENCOUNTER — Encounter: Payer: Self-pay | Admitting: Physician Assistant

## 2024-02-23 ENCOUNTER — Ambulatory Visit: Attending: Physician Assistant | Admitting: Physician Assistant

## 2024-02-23 VITALS — BP 116/73 | HR 83 | Resp 16 | Ht 64.0 in | Wt 201.8 lb

## 2024-02-23 DIAGNOSIS — E782 Mixed hyperlipidemia: Secondary | ICD-10-CM

## 2024-02-23 DIAGNOSIS — M19042 Primary osteoarthritis, left hand: Secondary | ICD-10-CM

## 2024-02-23 DIAGNOSIS — R768 Other specified abnormal immunological findings in serum: Secondary | ICD-10-CM

## 2024-02-23 DIAGNOSIS — Z96653 Presence of artificial knee joint, bilateral: Secondary | ICD-10-CM

## 2024-02-23 DIAGNOSIS — Z79899 Other long term (current) drug therapy: Secondary | ICD-10-CM | POA: Diagnosis not present

## 2024-02-23 DIAGNOSIS — M19071 Primary osteoarthritis, right ankle and foot: Secondary | ICD-10-CM | POA: Diagnosis not present

## 2024-02-23 DIAGNOSIS — M797 Fibromyalgia: Secondary | ICD-10-CM

## 2024-02-23 DIAGNOSIS — M19072 Primary osteoarthritis, left ankle and foot: Secondary | ICD-10-CM

## 2024-02-23 DIAGNOSIS — M79671 Pain in right foot: Secondary | ICD-10-CM

## 2024-02-23 DIAGNOSIS — M0609 Rheumatoid arthritis without rheumatoid factor, multiple sites: Secondary | ICD-10-CM | POA: Diagnosis not present

## 2024-02-23 DIAGNOSIS — M19041 Primary osteoarthritis, right hand: Secondary | ICD-10-CM | POA: Diagnosis not present

## 2024-02-23 DIAGNOSIS — F3341 Major depressive disorder, recurrent, in partial remission: Secondary | ICD-10-CM

## 2024-02-23 DIAGNOSIS — I10 Essential (primary) hypertension: Secondary | ICD-10-CM | POA: Diagnosis not present

## 2024-02-23 DIAGNOSIS — Z87898 Personal history of other specified conditions: Secondary | ICD-10-CM | POA: Diagnosis not present

## 2024-02-23 DIAGNOSIS — G4733 Obstructive sleep apnea (adult) (pediatric): Secondary | ICD-10-CM

## 2024-02-23 NOTE — Patient Instructions (Addendum)
 Standing Labs We placed an order today for your standing lab work.   Please have your standing labs drawn in November and every 5 months   Please have your labs drawn 2 weeks prior to your appointment so that the provider can discuss your lab results at your appointment, if possible.  Please note that you may see your imaging and lab results in MyChart before we have reviewed them. We will contact you once all results are reviewed. Please allow our office up to 72 hours to thoroughly review all of the results before contacting the office for clarification of your results.  WALK-IN LAB HOURS  Monday through Thursday from 8:00 am -12:30 pm and 1:00 pm-4:30 pm and Friday from 8:00 am-12:00 pm.  Patients with office visits requiring labs will be seen before walk-in labs.  You may encounter longer than normal wait times. Please allow additional time. Wait times may be shorter on  Monday and Thursday afternoons.  We do not book appointments for walk-in labs. We appreciate your patience and understanding with our staff.   Labs are drawn by [facility]. Please bring your co-pay at the time of your lab draw.  You may receive a bill from [facility] for your lab work.  Please note if you are on Hydroxychloroquine  and and an order has been placed for a Hydroxychloroquine  level,  you will need to have it drawn 4 hours or more after your last dose.  If you wish to have your labs drawn at another location, please call the office 24 hours in advance so we can fax the orders.  The office is located at [address], [place], [place] [address]   If you have any questions regarding directions or hours of operation,  please call [phone].   As a reminder, please drink plenty of water prior to coming for your lab work. Thanks!   Hand Exercises Hand exercises can be helpful for almost anyone. They can strengthen your hands and improve flexibility and movement. The exercises can also increase blood  flow to the hands. These results can make your work and daily tasks easier for you. Hand exercises can be especially helpful for people who have joint pain from arthritis or nerve damage from using their hands over and over. These exercises can also help people who injure a hand. Exercises Most of these hand exercises are gentle stretching and motion exercises. It is usually safe to do them often throughout the day. Warming up your hands before exercise may help reduce stiffness. You can do this with gentle massage or by placing your hands in warm water for 10-15 minutes. It is normal to feel some stretching, pulling, tightness, or mild discomfort when you begin new exercises. In time, this will improve. Remember to always be careful and stop right away if you feel sudden, very bad pain or your pain gets worse. You want to get better and be safe. Ask your health care provider which exercises are safe for you. Do exercises exactly as told by your provider and adjust them as told. Do not begin these exercises until told by your provider. Knuckle bend or claw fist  Stand or sit with your arm, hand, and all five fingers pointed straight up. Make sure to keep your wrist straight. Gently bend your fingers down toward your palm until the tips of your fingers are touching your palm. Keep your big knuckle straight and only bend the small knuckles in your fingers. Hold this position  for 10 seconds. Straighten your fingers back to your starting position. Repeat this exercise 5-10 times with each hand. Full finger fist  Stand or sit with your arm, hand, and all five fingers pointed straight up. Make sure to keep your wrist straight. Gently bend your fingers into your palm until the tips of your fingers are touching the middle of your palm. Hold this position for 10 seconds. Extend your fingers back to your starting position, stretching every joint fully. Repeat this exercise 5-10 times with each  hand. Straight fist  Stand or sit with your arm, hand, and all five fingers pointed straight up. Make sure to keep your wrist straight. Gently bend your fingers at the big knuckle, where your fingers meet your hand, and at the middle knuckle. Keep the knuckle at the tips of your fingers straight and try to touch the bottom of your palm. Hold this position for 10 seconds. Extend your fingers back to your starting position, stretching every joint fully. Repeat this exercise 5-10 times with each hand. Tabletop  Stand or sit with your arm, hand, and all five fingers pointed straight up. Make sure to keep your wrist straight. Gently bend your fingers at the big knuckle, where your fingers meet your hand, as far down as you can. Keep the small knuckles in your fingers straight. Think of forming a tabletop with your fingers. Hold this position for 10 seconds. Extend your fingers back to your starting position, stretching every joint fully. Repeat this exercise 5-10 times with each hand. Finger spread  Place your hand flat on a table with your palm facing down. Make sure your wrist stays straight. Spread your fingers and thumb apart from each other as far as you can until you feel a gentle stretch. Hold this position for 10 seconds. Bring your fingers and thumb tight together again. Hold this position for 10 seconds. Repeat this exercise 5-10 times with each hand. Making circles  Stand or sit with your arm, hand, and all five fingers pointed straight up. Make sure to keep your wrist straight. Make a circle by touching the tip of your thumb to the tip of your index finger. Hold for 10 seconds. Then open your hand wide. Repeat this motion with your thumb and each of your fingers. Repeat this exercise 5-10 times with each hand. Thumb motion  Sit with your forearm resting on a table and your wrist straight. Your thumb should be facing up toward the ceiling. Keep your fingers relaxed as you move  your thumb. Lift your thumb up as high as you can toward the ceiling. Hold for 10 seconds. Bend your thumb across your palm as far as you can, reaching the tip of your thumb for the small finger (pinkie) side of your palm. Hold for 10 seconds. Repeat this exercise 5-10 times with each hand. Grip strengthening  Hold a stress ball or other soft ball in the middle of your hand. Slowly increase the pressure, squeezing the ball as much as you can without causing pain. Think of bringing the tips of your fingers into the middle of your palm. All of your finger joints should bend when doing this exercise. Hold your squeeze for 10 seconds, then relax. Repeat this exercise 5-10 times with each hand. Contact a health care provider if: Your hand pain or discomfort gets much worse when you do an exercise. Your hand pain or discomfort does not improve within 2 hours after you exercise. If you have either  of these problems, stop doing these exercises right away. Do not do them again unless your provider says that you can. Get help right away if: You develop sudden, severe hand pain or swelling. If this happens, stop doing these exercises right away. Do not do them again unless your provider says that you can. This information is not intended to replace advice given to you by your health care provider. Make sure you discuss any questions you have with your health care provider. Document Revised: 06/23/2022 Document Reviewed: 06/23/2022 Elsevier Patient Education  2024 [employer].

## 2024-03-07 ENCOUNTER — Other Ambulatory Visit: Payer: Self-pay | Admitting: Physician Assistant

## 2024-03-07 DIAGNOSIS — M0609 Rheumatoid arthritis without rheumatoid factor, multiple sites: Secondary | ICD-10-CM

## 2024-03-07 NOTE — Telephone Encounter (Signed)
 Last Fill: 12/15/2023  Eye exam: 11/17/2023   Labs: 11/23/2023 CBC is normal, CMP shows elevated ALT (liver function) which is stable.   Next Visit: 07/27/2024  Last Visit: 02/23/2024  IK:Myzlfjunpi arthritis of multiple sites with negative rheumatoid factor   Current Dose per office note on 02/23/2024: Plaquenil  200 mg 1 tablet by mouth twice daily Monday to Friday.   Okay to refill Plaquenil ?

## 2024-03-22 ENCOUNTER — Ambulatory Visit (INDEPENDENT_AMBULATORY_CARE_PROVIDER_SITE_OTHER): Admitting: Nurse Practitioner

## 2024-03-22 ENCOUNTER — Encounter: Payer: Self-pay | Admitting: Nurse Practitioner

## 2024-03-22 VITALS — BP 130/88 | HR 85 | Temp 97.8°F | Ht 63.0 in | Wt 199.0 lb

## 2024-03-22 DIAGNOSIS — E559 Vitamin D deficiency, unspecified: Secondary | ICD-10-CM

## 2024-03-22 DIAGNOSIS — Z0000 Encounter for general adult medical examination without abnormal findings: Secondary | ICD-10-CM

## 2024-03-22 DIAGNOSIS — Z23 Encounter for immunization: Secondary | ICD-10-CM

## 2024-03-22 DIAGNOSIS — Z1231 Encounter for screening mammogram for malignant neoplasm of breast: Secondary | ICD-10-CM

## 2024-03-22 DIAGNOSIS — I10 Essential (primary) hypertension: Secondary | ICD-10-CM | POA: Diagnosis not present

## 2024-03-22 DIAGNOSIS — G4733 Obstructive sleep apnea (adult) (pediatric): Secondary | ICD-10-CM

## 2024-03-22 DIAGNOSIS — F3341 Major depressive disorder, recurrent, in partial remission: Secondary | ICD-10-CM

## 2024-03-22 DIAGNOSIS — M0600 Rheumatoid arthritis without rheumatoid factor, unspecified site: Secondary | ICD-10-CM

## 2024-03-22 DIAGNOSIS — M8580 Other specified disorders of bone density and structure, unspecified site: Secondary | ICD-10-CM

## 2024-03-22 DIAGNOSIS — E785 Hyperlipidemia, unspecified: Secondary | ICD-10-CM | POA: Diagnosis not present

## 2024-03-22 NOTE — Assessment & Plan Note (Signed)
Patient currently maintained on rosuvastatin 20 mg daily.  Pending lipid panel

## 2024-03-22 NOTE — Assessment & Plan Note (Signed)
 Patient currently maintained on hydrochlorothiazide  25 mg daily.  Blood pressure controlled.  Continue medication as prescribed

## 2024-03-22 NOTE — Assessment & Plan Note (Signed)
 Discussed age-appropriate immunization screening exams.  She reviewed patient's personal, surgical, social, family histories.  Patient up-to-date with all age-appropriate vaccinations she would like.  Update tetanus and she is at local pharmacy.  Deferred flu vaccine today.  Update Prevnar 20 today.  Patient up-to-date on CRC screening.  Aged out of cervical cancer screening.  Up-to-date on breast cancer screening.  DEXA scan placed for screening for osteoporosis in the setting of osteopenia.  Patient was given information at discharge about preventative healthcare maintenance with anticipatory guidance

## 2024-03-22 NOTE — Assessment & Plan Note (Signed)
Pending TSH, lipid panel, A1c.  Continue working on healthy lifestyle modifications

## 2024-03-22 NOTE — Assessment & Plan Note (Signed)
 Followed by rheumatology currently on Plaquenil  and meloxicam .  Continue medication as prescribed continue following with specialist as recommended

## 2024-03-22 NOTE — Assessment & Plan Note (Signed)
 History of the same bone density scan ordered today

## 2024-03-22 NOTE — Assessment & Plan Note (Signed)
 Followed by behavioral health specialist.  Patient currently on Seroquel , Lamictal , Klonopin .  Pending TSH and A1c along with lipids.  Continue taking medication as prescribed follow-up with specialist as recommended

## 2024-03-22 NOTE — Progress Notes (Signed)
 Established Patient Office Visit  Subjective   Patient ID: [name], female    DOB: 05/25/1956  Age: 68 y.o. MRN: [id number]  Chief Complaint  Patient presents with   Annual Exam    Prevnar 20 and shingles vaccine   Medication Management    Pt would like to discuss alternative nasal sprays.     HPI  OSA: Currently maintained on CPAP.  Patient followed through [facility] Dr. Chalice.  GERD: Currently maintained on omeprazole 40 mg daily  HTN: Currently maintained on hydrochlorothiazide  25 mg daily. States tat she will check it once a month at hom  Mood: Currently on Seroquel , Lamictal , Klonopin .  She is followed by Darice Molt  HLD: Currently maintained on rosuvastatin  20 mg daily  Rheumatoid arthritis: Patient currently maintained on Plaquenil  and meloxicam  15 mg daily.  She is followed by rheumatology.  for complete physical and follow up of chronic conditions.  Immunizations: -Tetanus: Completed in updated pharmacy -Influenza: Deferred -Shingles: Completed Shingrix first vaccine -Pneumonia: Completed 2015, needs Prevnar 20.  Update today  Diet: Fair diet. She is doing 3 meals a day and she will drink water Exercise: No regular exercise. She is doing elder fit. And another that is a belly fat burner from [employer]. She has trounle with her feet. She is doing 5 times   Eye exam: Completes annually. Seeing Dr. Milan for cataract   Dental exam: Completes semi-annually    Colonoscopy: Completed in 12/01/2017, recall 10 years Lung Cancer Screening: N/A  Pap smear: Aged out  Mammogram:09/16/2023, repeat 1 year  DEXA: Due  Sleep: she goes to bed aroun 11 or after and get up around 830-9. States that she feels rested. She wears the machine all night   Advanced directive:thinks she has a living will      Review of Systems  Constitutional:  Negative for chills and fever.  Respiratory:  Negative for shortness of breath.    Cardiovascular:  Negative for chest pain and leg swelling.  Gastrointestinal:  Negative for abdominal pain, blood in stool, constipation, diarrhea, nausea and vomiting.       Bm daily  Genitourinary:  Negative for dysuria and hematuria.  Neurological:  Positive for dizziness. Negative for tingling and headaches.  Psychiatric/Behavioral:  Negative for hallucinations and suicidal ideas.       Objective:     BP 130/88   Pulse 85   Temp 97.8 F (36.6 C) (Oral)   Ht 5' 3 (1.6 m)   Wt 199 lb (90.3 kg)   SpO2 93%   BMI 35.25 kg/m  BP Readings from Last 3 Encounters:  03/22/24 130/88  02/23/24 116/73  01/20/24 (!) 144/91   Wt Readings from Last 3 Encounters:  03/22/24 199 lb (90.3 kg)  02/23/24 201 lb 12.8 oz (91.5 kg)  01/20/24 201 lb (91.2 kg)   SpO2 Readings from Last 3 Encounters:  03/22/24 93%  01/20/24 96%  09/20/23 96%      Physical Exam Vitals and nursing note reviewed.  Constitutional:      Appearance: Normal appearance.  HENT:     Right Ear: Tympanic membrane, ear canal and external ear normal.     Left Ear: Tympanic membrane, ear canal and external ear normal.     Mouth/Throat:     Mouth: Mucous membranes are moist.     Pharynx: Oropharynx is clear.  Eyes:     Extraocular Movements: Extraocular movements intact.     Pupils: Pupils are  equal, round, and reactive to light.  Cardiovascular:     Rate and Rhythm: Normal rate and regular rhythm.     Pulses: Normal pulses.     Heart sounds: Normal heart sounds.  Pulmonary:     Effort: Pulmonary effort is normal.     Breath sounds: Normal breath sounds.  Abdominal:     General: Bowel sounds are normal. There is no distension.     Palpations: There is no mass.     Tenderness: There is no abdominal tenderness.     Hernia: No hernia is present.  Musculoskeletal:     Right lower leg: No edema.     Left lower leg: No edema.  Lymphadenopathy:     Cervical: No cervical adenopathy.  Skin:    General: Skin is  warm.  Neurological:     General: No focal deficit present.     Mental Status: She is alert.     Deep Tendon Reflexes:     Reflex Scores:      Bicep reflexes are 2+ on the right side and 2+ on the left side.      Patellar reflexes are 2+ on the right side and 2+ on the left side.    Comments: Bilateral upper and lower extremity strength 5/5  Psychiatric:        Mood and Affect: Mood normal.        Behavior: Behavior normal.        Thought Content: Thought content normal.        Judgment: Judgment normal.      No results found for any visits on 03/22/24.    The ASCVD Risk score (Arnett DK, et al., 2019) failed to calculate for the following reasons:   The valid total cholesterol range is 130 to 320 mg/dL    Assessment & Plan:   Problem List Items Addressed This Visit       Cardiovascular and Mediastinum   Essential hypertension   Patient currently maintained on hydrochlorothiazide  25 mg daily.  Blood pressure controlled.  Continue medication as prescribed      Relevant Orders   CBC with Differential/Platelet   Comprehensive metabolic panel with GFR   Hemoglobin A1c   TSH   Lipid panel     Respiratory   OSA on CPAP   Good to hear and send result per patient report.  Continues on CPAP nightly follow-up with neurology as recommended        Musculoskeletal and Integument   Seronegative rheumatoid arthritis (HCC)   Followed by rheumatology currently on Plaquenil  and meloxicam .  Continue medication as prescribed continue following with specialist as recommended      Osteopenia   History of the same bone density scan ordered today      Relevant Orders   DG Bone Density     Other   Vitamin D  deficiency   Pending vitamin D  level today      Relevant Orders   VITAMIN D  25 Hydroxy (Vit-D Deficiency, Fractures)   Depression, major, recurrent, in partial remission   Followed by behavioral health specialist.  Patient currently on Seroquel , Lamictal , Klonopin .   Pending TSH and A1c along with lipids.  Continue taking medication as prescribed follow-up with specialist as recommended      Morbid obesity (HCC) - BMI 30+ with OSA   Pending TSH, lipid panel, A1c.  Continue working on healthy lifestyle modifications      Relevant Orders   Hemoglobin A1c   Lipid  panel   Hyperlipidemia   Patient currently maintained on rosuvastatin  20 mg daily.  Pending lipid panel      Preventative health care - Primary   Discussed age-appropriate immunization screening exams.  She reviewed patient's personal, surgical, social, family histories.  Patient up-to-date with all age-appropriate vaccinations she would like.  Update tetanus and she is at local pharmacy.  Deferred flu vaccine today.  Update Prevnar 20 today.  Patient up-to-date on CRC screening.  Aged out of cervical cancer screening.  Up-to-date on breast cancer screening.  DEXA scan placed for screening for osteoporosis in the setting of osteopenia.  Patient was given information at discharge about preventative healthcare maintenance with anticipatory guidance      Relevant Orders   CBC with Differential/Platelet   Comprehensive metabolic panel with GFR   TSH   Other Visit Diagnoses       Need for pneumococcal 20-valent conjugate vaccination       Relevant Orders   Pneumococcal conjugate vaccine 20-valent (Prevnar 20) (Completed)     Screening mammogram for breast cancer       Relevant Orders   MM 3D SCREENING MAMMOGRAM BILATERAL BREAST       Return in about 6 months (around 09/20/2024) for BP recheck.    Adina Crandall, NP

## 2024-03-22 NOTE — Assessment & Plan Note (Signed)
 Good to hear and send result per patient report.  Continues on CPAP nightly follow-up with neurology as recommended

## 2024-03-22 NOTE — Assessment & Plan Note (Signed)
 Pending vitamin D level today.

## 2024-03-22 NOTE — Patient Instructions (Addendum)
 Nice to see you today  We did update your pneumonia vaccine with the Prevnar 20  Get the tetanus vaccine and the second shingles vaccine at the pharmacy  Follow up with me in 6 months, sooner if you need me   Call and schedule you mammogram and bone density scan at   [facility] [address] [place] [address]  [phone]

## 2024-03-23 LAB — CBC WITH DIFFERENTIAL/PLATELET
Basophils Absolute: 0 K/uL (ref 0.0–0.1)
Basophils Relative: 0.7 % (ref 0.0–3.0)
Eosinophils Absolute: 0.2 K/uL (ref 0.0–0.7)
Eosinophils Relative: 3.2 % (ref 0.0–5.0)
HCT: 42.2 % (ref 36.0–46.0)
Hemoglobin: 14.1 g/dL (ref 12.0–15.0)
Lymphocytes Relative: 30.1 % (ref 12.0–46.0)
Lymphs Abs: 1.5 K/uL (ref 0.7–4.0)
MCHC: 33.3 g/dL (ref 30.0–36.0)
MCV: 89.6 fl (ref 78.0–100.0)
Monocytes Absolute: 0.5 K/uL (ref 0.1–1.0)
Monocytes Relative: 9.8 % (ref 3.0–12.0)
Neutro Abs: 2.8 K/uL (ref 1.4–7.7)
Neutrophils Relative %: 56.2 % (ref 43.0–77.0)
Platelets: 252 K/uL (ref 150.0–400.0)
RBC: 4.71 Mil/uL (ref 3.87–5.11)
RDW: 14.4 % (ref 11.5–15.5)
WBC: 5 K/uL (ref 4.0–10.5)

## 2024-03-23 LAB — COMPREHENSIVE METABOLIC PANEL WITH GFR
ALT: 36 U/L — ABNORMAL HIGH (ref 0–35)
AST: 30 U/L (ref 0–37)
Albumin: 4.6 g/dL (ref 3.5–5.2)
Alkaline Phosphatase: 88 U/L (ref 39–117)
BUN: 21 mg/dL (ref 6–23)
CO2: 29 meq/L (ref 19–32)
Calcium: 10 mg/dL (ref 8.4–10.5)
Chloride: 103 meq/L (ref 96–112)
Creatinine, Ser: 0.66 mg/dL (ref 0.40–1.20)
GFR: 90.9 mL/min (ref >60.00)
Glucose, Bld: 107 mg/dL — ABNORMAL HIGH (ref 70–99)
Potassium: 4.1 meq/L (ref 3.5–5.1)
Sodium: 142 meq/L (ref 135–145)
Total Bilirubin: 0.5 mg/dL (ref 0.2–1.2)
Total Protein: 6.9 g/dL (ref 6.0–8.3)

## 2024-03-23 LAB — HEMOGLOBIN A1C: Hgb A1c MFr Bld: 6.2 % (ref 4.6–6.5)

## 2024-03-23 LAB — LIPID PANEL
Cholesterol: 121 mg/dL (ref 0–200)
HDL: 48.6 mg/dL (ref >39.00)
LDL Cholesterol: 48 mg/dL (ref 0–99)
NonHDL: 72.76
Total CHOL/HDL Ratio: 2
Triglycerides: 124 mg/dL (ref 0.0–149.0)
VLDL: 24.8 mg/dL (ref 0.0–40.0)

## 2024-03-23 LAB — VITAMIN D 25 HYDROXY (VIT D DEFICIENCY, FRACTURES): VITD: 52.4 ng/mL (ref 30.00–100.00)

## 2024-03-23 LAB — TSH: TSH: 1.29 u[IU]/mL (ref 0.35–5.50)

## 2024-03-24 ENCOUNTER — Ambulatory Visit: Payer: Self-pay | Admitting: Nurse Practitioner

## 2024-03-24 DIAGNOSIS — R7303 Prediabetes: Secondary | ICD-10-CM | POA: Insufficient documentation

## 2024-04-10 ENCOUNTER — Other Ambulatory Visit: Payer: Self-pay | Admitting: Nurse Practitioner

## 2024-04-17 DIAGNOSIS — H2511 Age-related nuclear cataract, right eye: Secondary | ICD-10-CM | POA: Diagnosis not present

## 2024-04-18 DIAGNOSIS — H2512 Age-related nuclear cataract, left eye: Secondary | ICD-10-CM | POA: Diagnosis not present

## 2024-05-01 DIAGNOSIS — H5371 Glare sensitivity: Secondary | ICD-10-CM | POA: Diagnosis not present

## 2024-05-01 DIAGNOSIS — H25042 Posterior subcapsular polar age-related cataract, left eye: Secondary | ICD-10-CM | POA: Diagnosis not present

## 2024-05-01 DIAGNOSIS — H2512 Age-related nuclear cataract, left eye: Secondary | ICD-10-CM | POA: Diagnosis not present

## 2024-05-02 DIAGNOSIS — G4733 Obstructive sleep apnea (adult) (pediatric): Secondary | ICD-10-CM | POA: Diagnosis not present

## 2024-05-27 ENCOUNTER — Other Ambulatory Visit: Payer: Self-pay | Admitting: Physician Assistant

## 2024-05-27 DIAGNOSIS — M0609 Rheumatoid arthritis without rheumatoid factor, multiple sites: Secondary | ICD-10-CM

## 2024-05-29 NOTE — Telephone Encounter (Signed)
 Last Fill: 03/07/2024  Eye exam: 11/17/2023 WNL   Labs: 03/22/2024 Glucose 107 ALT 36  Next Visit: 07/27/2024  Last Visit: 02/23/2024  IK:Myzlfjunpi arthritis of multiple sites with negative rheumatoid factor (HCC)   Current Dose per office note 02/23/2024: Plaquenil  200 mg 1 tablet by mouth twice daily Monday to Friday.   Okay to refill Plaquenil ?

## 2024-07-13 NOTE — Progress Notes (Signed)
 "  Office Visit Note  Patient: [name]             Date of Birth: [birth year]           MRN: [id number]             PCP: Wendee Lynwood HERO, NP Referring: Wendee Lynwood HERO, NP Visit Date: 07/27/2024 Occupation: Data Unavailable  Subjective:  Medication management  History of Present Illness: [name] is a 69 y.o. female with rheumatoid arthritis, osteoarthritis and fibromyalgia syndrome.  She returns today after her last visit in September 2025.  She denies having a rheumatoid arthritis flare since her last visit.  She continues to have some stiffness in her hands and her knee joints.  Her knee joints are replaced.  She is on hydroxychloroquine  1 tablet p.o. twice daily Monday to Friday which she has been taking on a regular basis.  She also takes meloxicam  15 mg and Tylenol  on as needed basis.  She continues to have generalized pain and discomfort from fibromyalgia.  She had bilateral eye cataract surgery in 2025    Activities of Daily Living:  Patient reports morning stiffness for 30-45 minutes.   Patient Reports nocturnal pain.  Difficulty dressing/grooming: Denies Difficulty climbing stairs: Denies Difficulty getting out of chair: Reports Difficulty using hands for taps, buttons, cutlery, and/or writing: Denies  Review of Systems  Constitutional:  Negative for fatigue.  HENT:  Positive for mouth dryness. Negative for mouth sores.   Eyes:  Negative for dryness.  Respiratory:  Positive for shortness of breath.   Cardiovascular:  Negative for chest pain and palpitations.  Gastrointestinal:  Negative for blood in stool, constipation and diarrhea.  Endocrine: Negative for increased urination.  Genitourinary:  Negative for involuntary urination.  Musculoskeletal:  Positive for joint pain, joint pain, joint swelling and morning stiffness. Negative for gait problem, myalgias, muscle weakness, muscle tenderness and myalgias.  Skin:  Negative for color change, rash, hair loss and  sensitivity to sunlight.  Allergic/Immunologic: Negative for susceptible to infections.  Neurological:  Positive for headaches. Negative for dizziness.  Hematological:  Negative for swollen glands.  Psychiatric/Behavioral:  Positive for sleep disturbance. Negative for depressed mood. The patient is nervous/anxious.     PMFS History:  Patient Active Problem List   Diagnosis Date Noted   Prediabetes 03/24/2024   Preventative health care 09/20/2023   Osteopenia 09/03/2021   Seronegative rheumatoid arthritis (HCC) 03/12/2021   Hyperlipidemia 03/11/2021   Fatty liver 12/12/2019   Other abnormal glucose (prediabetes) 05/23/2019   Osteoarthritis of both knees 03/20/2015   OSA on CPAP 08/15/2014   Other chronic pain 01/31/2014   Depression, major, recurrent, in partial remission 01/31/2014   Morbid obesity (HCC) - BMI 30+ with OSA 01/31/2014   Essential hypertension 08/09/2013   Vitamin D  deficiency 08/09/2013    Past Medical History:  Diagnosis Date   Allergy    medicine and latex   Anxiety    Arthritis    Fibromyalgia    Hypertension    S/P total knee arthroplasty, left 01/07/2019   Simple renal cyst 12/12/2019   Incidentally noted on abd US  11/2019 - radiologist recommended further characterization with MRI w & w/o contrast   Hypoechoic lesions within the kidney which may simply represent mildly complicated cysts. The largest of these lies in the midportion of the kidney with increased echogenicity within and suggestion of internal color flow which may be related to septation. MRI of the kidneys with and w  Sleep apnea    Status post total shoulder arthroplasty, left 01/07/2019   Vitamin D  deficiency     Family History  Problem Relation Age of Onset   Hypertension Mother    Heart attack Mother    Arthritis Mother    Hypertension Father    COPD Father    Thyroid  cancer Father    Cancer Father    CVA Maternal Grandmother 61   Stroke Maternal Grandmother    Heart attack  Maternal Grandfather 59   Deep vein thrombosis Maternal Grandfather    Arthritis Maternal Grandfather    Arthritis Maternal Aunt    Colon cancer Neg Hx    Esophageal cancer Neg Hx    Stomach cancer Neg Hx    Breast cancer Neg Hx    BRCA 1/2 Neg Hx    Past Surgical History:  Procedure Laterality Date   ABDOMINAL HYSTERECTOMY  2002   Dr. Renne    CATARACT EXTRACTION, BILATERAL Bilateral 2025   COLONOSCOPY  2009   JOINT REPLACEMENT  05-18-13 and 01-06-19   arthroscopy   KNEE ARTHROPLASTY     TOTAL KNEE ARTHROPLASTY Right 04/14/2013   Procedure: RIGHT TOTAL KNEE ARTHROPLASTY AND LEFT KNEE INJECTION ;  Surgeon: Norleen LITTIE Gavel, MD;  Location: [facility] OR;  Service: Orthopedics;  Laterality: Right;   TOTAL KNEE ARTHROPLASTY Left 01/06/2019   Procedure: TOTAL KNEE ARTHROPLASTY;  Surgeon: Gavel Norleen, MD;  Location: [facility];  Service: Orthopedics;  Laterality: Left;   Social History[1] Social History   Social History Narrative   Retired. She was a geologist, engineering      [name] [phone])   [name] (37)     Immunization History  Administered Date(s) Administered   DT (Pediatric) 07/05/2015   Fluad Quad(high Dose 65+) 04/08/2022   Influenza Inj Mdck Quad With Preservative 05/28/2017, 04/13/2018   Influenza Split 04/06/2012, 04/05/2013, 05/16/2014, 03/21/2015   Influenza,inj,Quad PF,6+ Mos 03/16/2019   Influenza,inj,quad, With Preservative 04/29/2016   PFIZER(Purple Top)SARS-COV-2 Vaccination 11/08/2019, 11/29/2019, 07/24/2020, 08/13/2020   PNEUMOCOCCAL CONJUGATE-20 03/22/2024   Pneumococcal Conjugate-13 05/16/2014   Pneumococcal-Unspecified 06/22/1998   Td 06/22/2004   Zoster Recombinant(Shingrix) 10/16/2016   Zoster, Live 10/21/2015, 11/21/2015     Objective: Vital Signs: BP 138/82   Pulse 81   Temp (!) 97.5 F (36.4 C)   Resp 14   Ht 5' 4 (1.626 m)   Wt 204 lb (92.5 kg)   BMI 35.02 kg/m    Physical Exam Vitals and nursing note reviewed.  Constitutional:      Appearance: She  is well-developed.  HENT:     Head: Normocephalic and atraumatic.  Eyes:     Conjunctiva/sclera: Conjunctivae normal.  Cardiovascular:     Rate and Rhythm: Normal rate and regular rhythm.     Heart sounds: Normal heart sounds.  Pulmonary:     Effort: Pulmonary effort is normal.     Breath sounds: Normal breath sounds.  Abdominal:     General: Bowel sounds are normal.     Palpations: Abdomen is soft.  Musculoskeletal:     Cervical back: Normal range of motion.  Lymphadenopathy:     Cervical: No cervical adenopathy.  Skin:    General: Skin is warm and dry.     Capillary Refill: Capillary refill takes less than 2 seconds.  Neurological:     Mental Status: She is alert and oriented to person, place, and time.  Psychiatric:        Behavior: Behavior normal.  Musculoskeletal Exam:   Cervical, thoracic and lumbar spine were in good range of motion.  There was no SI joint tenderness.  Shoulder joints, elbow joints, wrist joints, MCPs, PIPs and DIPs were in good range of motion with no synovitis.  Bilateral PIP and DIP thickening was noted.  Hip joints and knee joints were in good range of motion without any warmth swelling or effusion.  Bilateral dorsal spurs were noted.  There was no tenderness over ankles or MTPs.  She had hyperalgesia and tender points.  CDAI Exam: CDAI Score: -- Patient Global: 10 / 100; Provider Global: 10 / 100 Swollen: --; Tender: -- Joint Exam 07/27/2024   No joint exam has been documented for this visit   There is currently no information documented on the homunculus. Go to the Rheumatology activity and complete the homunculus joint exam.  Investigation: No additional findings.  Imaging: No results found.  Recent Labs: Lab Results  Component Value Date   WBC 5.0 03/22/2024   HGB 14.1 03/22/2024   PLT 252.0 03/22/2024   NA 142 03/22/2024   K 4.1 03/22/2024   CL 103 03/22/2024   CO2 29 03/22/2024   GLUCOSE 107 (H) 03/22/2024   BUN 21  03/22/2024   CREATININE 0.66 03/22/2024   BILITOT 0.5 03/22/2024   ALKPHOS 88 03/22/2024   AST 30 03/22/2024   ALT 36 (H) 03/22/2024   PROT 6.9 03/22/2024   ALBUMIN 4.6 03/22/2024   CALCIUM  10.0 03/22/2024   GFRAA 103 09/02/2020    Speciality Comments: PLQ Eye Exam: 11/17/2023 WNL @ [facility]   Procedures:  No procedures performed Allergies: Latex, Iodinated contrast media, Celecoxib, Cephalexin, Codeine, Erythromycin, Sulfonamide derivatives, Trazodone and nefazodone, and Venlafaxine   Assessment / Plan:     Visit Diagnoses: Rheumatoid arthritis of multiple sites with negative rheumatoid factor (HCC) - RF-, +anti-CCP: Patient had no synovitis on the examination.  She denies having a rheumatoid arthritis flare.  However she continues to have stiffness and discomfort in multiple joints most likely due to osteoarthritis.  High risk medication use - Plaquenil  200 mg 1 tablet by mouth twice daily Monday to Friday. PLQ Eye Exam: 11/17/2023 -CBC and CMP were normal in October 2025 except mildly elevated ALT.  Will check labs today.  Plan: CBC with Differential/Platelet, Comprehensive metabolic panel with GFR.  Information regarding immunization was placed in the AVS.  Positive anti-CCP test - Anti-CCP 39 (weak positive), RF negative, ESR 6.  Primary osteoarthritis of both hands -she bilateral PIP and DIP thickening.  Joint protection muscle strengthening was discussed.  She has been doing stretching exercises.  X-rays of both hands from May 2024 are consistent with osteoarthritic changes-no radiographic progression when compared to x-rays from 2021.No erosive changes  Status post total bilateral knee replacement-doing well.  She had good range of motion bilateral knee joints.  Pain in right foot -she continues to have some discomfort over the dorsal spurs.  Preferring shoes were advised.  She had a right intertarsal joint injection performed on 09/22/2023 which reported no relief--she does  not plan to proceed with repeat injection.  Primary osteoarthritis of both feet  Fibromyalgia-she continues to have generalized pain and stiffness.  She had positive tender points.  Need for regular exercise and stretching was emphasized.  Other medical problems are listed as follows:  Mixed hyperlipidemia-followed by her PCP.  Increased risk of heart disease with rheumatoid arthritis was discussed.  Dietary modification and exercise was emphasized.  History of prediabetes  Depression,  major, recurrent, in partial remission  Essential hypertension  OSA on CPAP  Orders: Orders Placed This Encounter  Procedures   CBC with Differential/Platelet   Comprehensive metabolic panel with GFR   No orders of the defined types were placed in this encounter.    Follow-Up Instructions: Return in about 5 months (around 12/24/2024) for Rheumatoid arthritis, Osteoarthritis.   Maya Nash, MD  Note - This record has been created using Animal nutritionist.  Chart creation errors have been sought, but may not always  have been located. Such creation errors do not reflect on  the standard of medical care.     [1]  Social History Tobacco Use   Smoking status: Never    Passive exposure: Past   Smokeless tobacco: Never  Vaping Use   Vaping status: Never Used  Substance Use Topics   Alcohol use: No   Drug use: No   "

## 2024-07-24 ENCOUNTER — Other Ambulatory Visit: Payer: Self-pay | Admitting: Nurse Practitioner

## 2024-07-27 ENCOUNTER — Ambulatory Visit: Admitting: Rheumatology

## 2024-07-27 ENCOUNTER — Encounter: Payer: Self-pay | Admitting: Rheumatology

## 2024-07-27 VITALS — BP 138/82 | HR 81 | Temp 97.5°F | Resp 14 | Ht 64.0 in | Wt 204.0 lb

## 2024-07-27 DIAGNOSIS — Z79899 Other long term (current) drug therapy: Secondary | ICD-10-CM

## 2024-07-27 DIAGNOSIS — Z87898 Personal history of other specified conditions: Secondary | ICD-10-CM | POA: Diagnosis not present

## 2024-07-27 DIAGNOSIS — E782 Mixed hyperlipidemia: Secondary | ICD-10-CM | POA: Diagnosis not present

## 2024-07-27 DIAGNOSIS — M79671 Pain in right foot: Secondary | ICD-10-CM | POA: Diagnosis not present

## 2024-07-27 DIAGNOSIS — M797 Fibromyalgia: Secondary | ICD-10-CM | POA: Diagnosis not present

## 2024-07-27 DIAGNOSIS — Z96653 Presence of artificial knee joint, bilateral: Secondary | ICD-10-CM

## 2024-07-27 DIAGNOSIS — F3341 Major depressive disorder, recurrent, in partial remission: Secondary | ICD-10-CM

## 2024-07-27 DIAGNOSIS — R7681 Abnormal rheumatoid factor and anti-citrullinated protein antibody without rheumatoid arthritis: Secondary | ICD-10-CM | POA: Diagnosis not present

## 2024-07-27 DIAGNOSIS — M19072 Primary osteoarthritis, left ankle and foot: Secondary | ICD-10-CM

## 2024-07-27 DIAGNOSIS — I10 Essential (primary) hypertension: Secondary | ICD-10-CM | POA: Diagnosis not present

## 2024-07-27 DIAGNOSIS — M19042 Primary osteoarthritis, left hand: Secondary | ICD-10-CM

## 2024-07-27 DIAGNOSIS — M19071 Primary osteoarthritis, right ankle and foot: Secondary | ICD-10-CM

## 2024-07-27 DIAGNOSIS — G4733 Obstructive sleep apnea (adult) (pediatric): Secondary | ICD-10-CM

## 2024-07-27 DIAGNOSIS — M0609 Rheumatoid arthritis without rheumatoid factor, multiple sites: Secondary | ICD-10-CM | POA: Diagnosis not present

## 2024-07-27 DIAGNOSIS — M19041 Primary osteoarthritis, right hand: Secondary | ICD-10-CM

## 2024-07-27 NOTE — Patient Instructions (Signed)

## 2024-07-28 ENCOUNTER — Ambulatory Visit: Payer: Self-pay | Admitting: Rheumatology

## 2024-07-28 LAB — CBC WITH DIFFERENTIAL/PLATELET
Absolute Lymphocytes: 1683 {cells}/uL (ref 850–3900)
Absolute Monocytes: 617 {cells}/uL (ref 200–950)
Basophils Absolute: 31 {cells}/uL (ref 0–200)
Basophils Relative: 0.6 %
Eosinophils Absolute: 219 {cells}/uL (ref 15–500)
Eosinophils Relative: 4.3 %
HCT: 42.4 % (ref 35.9–46.0)
Hemoglobin: 13.9 g/dL (ref 11.7–15.5)
MCH: 30 pg (ref 27.0–33.0)
MCHC: 32.8 g/dL (ref 31.6–35.4)
MCV: 91.6 fL (ref 81.4–101.7)
MPV: 11 fL (ref 7.5–12.5)
Monocytes Relative: 12.1 %
Neutro Abs: 2550 {cells}/uL (ref 1500–7800)
Neutrophils Relative %: 50 %
Platelets: 309 10*3/uL (ref 140–400)
RBC: 4.63 Million/uL (ref 3.80–5.10)
RDW: 13 % (ref 11.0–15.0)
Total Lymphocyte: 33 %
WBC: 5.1 10*3/uL (ref 3.8–10.8)

## 2024-07-28 LAB — COMPREHENSIVE METABOLIC PANEL WITH GFR
AG Ratio: 2.2 (calc) (ref 1.0–2.5)
ALT: 49 U/L — ABNORMAL HIGH (ref 6–29)
AST: 38 U/L — ABNORMAL HIGH (ref 10–35)
Albumin: 4.6 g/dL (ref 3.6–5.1)
Alkaline phosphatase (APISO): 100 U/L (ref 37–153)
BUN: 23 mg/dL (ref 7–25)
CO2: 30 mmol/L (ref 20–32)
Calcium: 10.1 mg/dL (ref 8.6–10.4)
Chloride: 104 mmol/L (ref 98–110)
Creat: 0.65 mg/dL (ref 0.50–1.05)
Globulin: 2.1 g/dL (ref 1.9–3.7)
Glucose, Bld: 131 mg/dL — ABNORMAL HIGH (ref 65–99)
Potassium: 4.5 mmol/L (ref 3.5–5.3)
Sodium: 141 mmol/L (ref 135–146)
Total Bilirubin: 0.5 mg/dL (ref 0.2–1.2)
Total Protein: 6.7 g/dL (ref 6.1–8.1)
eGFR: 96 mL/min/{1.73_m2}

## 2024-07-28 NOTE — Progress Notes (Signed)
 Glucose is elevated, liver functions are elevated and higher than before, CBC is normal.  Patient should avoid all NSAIDs (including meloxicam  and ibuprofen) and alcohol use.  Please forward results to her PCP.

## 2024-09-27 ENCOUNTER — Ambulatory Visit: Admitting: Nurse Practitioner

## 2024-12-07 ENCOUNTER — Ambulatory Visit

## 2024-12-08 ENCOUNTER — Ambulatory Visit

## 2024-12-25 ENCOUNTER — Ambulatory Visit: Admitting: Rheumatology

## 2025-01-22 ENCOUNTER — Ambulatory Visit: Admitting: Family Medicine

## 2025-03-19 ENCOUNTER — Ambulatory Visit
